# Patient Record
Sex: Male | Born: 1960 | Race: Black or African American | Hispanic: No | Marital: Single | Smoking: Never smoker
Health system: Southern US, Community
[De-identification: ages and names within clinical notes are randomized; demographics above are authoritative.]

## PROBLEM LIST (undated history)

## (undated) ENCOUNTER — Emergency Department (HOSPITAL_COMMUNITY)

## (undated) DIAGNOSIS — I10 Essential (primary) hypertension: Secondary | ICD-10-CM

## (undated) DIAGNOSIS — J189 Pneumonia, unspecified organism: Secondary | ICD-10-CM

## (undated) DIAGNOSIS — D649 Anemia, unspecified: Secondary | ICD-10-CM

## (undated) DIAGNOSIS — E0500 Thyrotoxicosis with diffuse goiter without thyrotoxic crisis or storm: Secondary | ICD-10-CM

## (undated) DIAGNOSIS — J45909 Unspecified asthma, uncomplicated: Secondary | ICD-10-CM

## (undated) DIAGNOSIS — J9601 Acute respiratory failure with hypoxia: Secondary | ICD-10-CM

## (undated) DIAGNOSIS — E119 Type 2 diabetes mellitus without complications: Secondary | ICD-10-CM

---

## 1998-06-08 ENCOUNTER — Ambulatory Visit (HOSPITAL_COMMUNITY): Admission: RE | Admit: 1998-06-08 | Discharge: 1998-06-08 | Payer: Self-pay | Admitting: Internal Medicine

## 1998-06-09 ENCOUNTER — Encounter: Payer: Self-pay | Admitting: Internal Medicine

## 1998-06-26 ENCOUNTER — Ambulatory Visit (HOSPITAL_COMMUNITY): Admission: RE | Admit: 1998-06-26 | Discharge: 1998-06-26 | Payer: Self-pay | Admitting: Internal Medicine

## 1998-06-26 ENCOUNTER — Encounter: Payer: Self-pay | Admitting: Internal Medicine

## 2011-04-02 ENCOUNTER — Emergency Department (HOSPITAL_COMMUNITY)
Admission: EM | Admit: 2011-04-02 | Discharge: 2011-04-02 | Disposition: A | Discharge status: Home / Self Care | Payer: Worker's Compensation | Attending: Emergency Medicine | Admitting: Emergency Medicine

## 2011-04-02 ENCOUNTER — Encounter: Payer: Self-pay | Admitting: Emergency Medicine

## 2011-04-02 DIAGNOSIS — W268XXA Contact with other sharp object(s), not elsewhere classified, initial encounter: Secondary | ICD-10-CM | POA: Insufficient documentation

## 2011-04-02 DIAGNOSIS — Y9269 Other specified industrial and construction area as the place of occurrence of the external cause: Secondary | ICD-10-CM | POA: Insufficient documentation

## 2011-04-02 DIAGNOSIS — S61419A Laceration without foreign body of unspecified hand, initial encounter: Secondary | ICD-10-CM

## 2011-04-02 DIAGNOSIS — S61209A Unspecified open wound of unspecified finger without damage to nail, initial encounter: Secondary | ICD-10-CM | POA: Insufficient documentation

## 2011-04-02 MED ORDER — LIDOCAINE HCL (PF) 1 % IJ SOLN
5.0000 mL | Freq: Once | INTRAMUSCULAR | Status: AC
  Administered 2011-04-02: 5 mL via INTRADERMAL
  Filled 2011-04-02: qty 5

## 2011-04-02 MED ORDER — BACITRACIN ZINC 500 UNIT/GM EX OINT
TOPICAL_OINTMENT | Freq: Once | CUTANEOUS | Status: AC
  Administered 2011-04-02: 06:00:00 via TOPICAL

## 2011-04-02 MED ORDER — TETANUS-DIPHTH-ACELL PERTUSSIS 5-2.5-18.5 LF-MCG/0.5 IM SUSP
0.5000 mL | Freq: Once | INTRAMUSCULAR | Status: AC
  Administered 2011-04-02: 0.5 mL via INTRAMUSCULAR
  Filled 2011-04-02: qty 0.5

## 2011-04-02 MED ORDER — BACITRACIN ZINC 500 UNIT/GM EX OINT
TOPICAL_OINTMENT | CUTANEOUS | Status: AC
  Filled 2011-04-02: qty 0.9

## 2011-04-02 NOTE — ED Notes (Signed)
Pt lacerated base of left thumb with a razor knife at work, active bleeding controlled with pressure

## 2011-04-02 NOTE — ED Notes (Signed)
Set up for laceration repair

## 2011-04-02 NOTE — ED Provider Notes (Addendum)
History     CSN: [id number] Arrival date & time: 04/02/2011  3:09 AM   Chief Complaint  Patient presents with  . Extremity Laceration     (Include location/radiation/quality/duration/timing/severity/associated sxs/prior treatment) HPI Comments: Seen 20  Patient is a 50 y.o. male presenting with skin laceration. The history is provided by the patient.  Laceration  The incident occurred 1 to 2 hours ago (cut hand with a box cutter at work.). Pain location: left hand. The laceration is 2 cm in size. The laceration mechanism was a a razor. The pain is at a severity of 3/10. The pain is mild. He reports no foreign bodies present. His tetanus status is unknown.     History reviewed. No pertinent past medical history.   History reviewed. No pertinent past surgical history.  No family history on file.  History  Substance Use Topics  . Smoking status: Never Smoker   . Smokeless tobacco: Not on file  . Alcohol Use: No      Review of Systems  All other systems reviewed and are negative.    Allergies  Review of patient's allergies indicates no known allergies.  Home Medications  No current outpatient prescriptions on file.  Physical Exam    BP 140/84  Pulse 96  Temp(Src) 98.4 F (36.9 C) (Oral)  Resp 16  Ht 6\' 2"  (1.88 m)  Wt 365 lb (165.563 kg)  BMI 46.86 kg/m2  SpO2 97%  Physical Exam  Nursing note and vitals reviewed. Constitutional: He is oriented to person, place, and time. He appears well-developed and well-nourished.  HENT:  Head: Normocephalic and atraumatic.  Eyes: EOM are normal.  Neck: Normal range of motion.  Cardiovascular: Normal rate, normal heart sounds and intact distal pulses.   Pulmonary/Chest: Effort normal and breath sounds normal.  Musculoskeletal: Normal range of motion.  Neurological: He is alert and oriented to person, place, and time.  Skin: Skin is warm and dry.       2 cm superficial laceration to base of left thumb, bleeding  controlled.    ED Course  LACERATION REPAIR Date/Time: 04/02/2011 4:30 AM Performed by: Annamarie Dawley. Authorized by: Annamarie Dawley Consent: Verbal consent obtained. Risks and benefits: risks, benefits and alternatives were discussed Consent given by: patient Patient understanding: patient states understanding of the procedure being performed Patient consent: the patient's understanding of the procedure matches consent given Patient identity confirmed: verbally with patient and arm band Time out: Immediately prior to procedure a "time out" was called to verify the correct patient, procedure, equipment, support staff and site/side marked as required. Location: left thumb. Laceration length: 2 cm Tendon involvement: none Nerve involvement: none Vascular damage: no Local anesthetic: lidocaine 1% without epinephrine Patient sedated: no Irrigation solution: saline Irrigation method: syringe Amount of cleaning: standard Debridement: none Degree of undermining: none Skin closure: 5-0 Prolene Number of sutures: 2 Technique: simple Approximation: close Approximation difficulty: simple Dressing: 4x4 sterile gauze and antibiotic ointment Patient tolerance: Patient tolerated the procedure well with no immediate complications.   Patient with superficial laceration to thumb. Sutures, tetanus, workman's compensation form completed. Patient understands and agrees with initial ED impression and plan with expectations set for ED visit. MDM Reviewed: nursing note and vitals          Nicoletta Dress. Colon Branch, MD 04/02/11 [id number]  Nicoletta Dress. Colon Branch, MD 04/16/11 [phone]

## 2011-04-02 NOTE — ED Notes (Signed)
Pt left the er stating no needs 

## 2012-07-13 ENCOUNTER — Inpatient Hospital Stay (HOSPITAL_COMMUNITY)
Admission: EM | Admit: 2012-07-13 | Discharge: 2012-07-20 | DRG: 541 | Disposition: A | Discharge status: Home / Self Care | Payer: BC Managed Care – PPO | Attending: Internal Medicine | Admitting: Internal Medicine

## 2012-07-13 ENCOUNTER — Other Ambulatory Visit: Payer: Self-pay

## 2012-07-13 ENCOUNTER — Encounter (HOSPITAL_COMMUNITY): Payer: Self-pay | Admitting: *Deleted

## 2012-07-13 ENCOUNTER — Emergency Department (HOSPITAL_COMMUNITY): Payer: BC Managed Care – PPO

## 2012-07-13 DIAGNOSIS — J9600 Acute respiratory failure, unspecified whether with hypoxia or hypercapnia: Secondary | ICD-10-CM | POA: Diagnosis present

## 2012-07-13 DIAGNOSIS — E860 Dehydration: Secondary | ICD-10-CM | POA: Diagnosis present

## 2012-07-13 DIAGNOSIS — D72819 Decreased white blood cell count, unspecified: Secondary | ICD-10-CM | POA: Diagnosis present

## 2012-07-13 DIAGNOSIS — D649 Anemia, unspecified: Secondary | ICD-10-CM | POA: Diagnosis present

## 2012-07-13 DIAGNOSIS — E871 Hypo-osmolality and hyponatremia: Secondary | ICD-10-CM | POA: Diagnosis present

## 2012-07-13 DIAGNOSIS — E861 Hypovolemia: Secondary | ICD-10-CM | POA: Diagnosis present

## 2012-07-13 DIAGNOSIS — Z6841 Body Mass Index (BMI) 40.0 and over, adult: Secondary | ICD-10-CM

## 2012-07-13 DIAGNOSIS — R0902 Hypoxemia: Secondary | ICD-10-CM | POA: Diagnosis present

## 2012-07-13 DIAGNOSIS — J9601 Acute respiratory failure with hypoxia: Secondary | ICD-10-CM

## 2012-07-13 DIAGNOSIS — J189 Pneumonia, unspecified organism: Principal | ICD-10-CM | POA: Diagnosis present

## 2012-07-13 DIAGNOSIS — D6959 Other secondary thrombocytopenia: Secondary | ICD-10-CM | POA: Diagnosis present

## 2012-07-13 DIAGNOSIS — E876 Hypokalemia: Secondary | ICD-10-CM | POA: Diagnosis present

## 2012-07-13 DIAGNOSIS — D696 Thrombocytopenia, unspecified: Secondary | ICD-10-CM

## 2012-07-13 HISTORY — DX: Acute respiratory failure with hypoxia: J96.01

## 2012-07-13 HISTORY — DX: Pneumonia, unspecified organism: J18.9

## 2012-07-13 LAB — CBC WITH DIFFERENTIAL/PLATELET
Basophils Relative: 1 % (ref 0–1)
Eosinophils Relative: 0 % (ref 0–5)
HCT: 36.2 % — ABNORMAL LOW (ref 39.0–52.0)
Hemoglobin: 12.8 g/dL — ABNORMAL LOW (ref 13.0–17.0)
Lymphocytes Relative: 35 % (ref 12–46)
MCV: 88.7 fL (ref 78.0–100.0)
Monocytes Relative: 5 % (ref 3–12)
Neutro Abs: 2 10*3/uL (ref 1.7–7.7)
WBC: 3.4 10*3/uL — ABNORMAL LOW (ref 4.0–10.5)

## 2012-07-13 LAB — BLOOD GAS, ARTERIAL
Bicarbonate: 29 mEq/L — ABNORMAL HIGH (ref 20.0–24.0)
O2 Content: 4.5 L/min
O2 Saturation: 88.7 %
pCO2 arterial: 46.6 mmHg — ABNORMAL HIGH (ref 35.0–45.0)
pH, Arterial: 7.41 (ref 7.350–7.450)
pO2, Arterial: 57.8 mmHg — ABNORMAL LOW (ref 80.0–100.0)

## 2012-07-13 LAB — MRSA PCR SCREENING: MRSA by PCR: NEGATIVE

## 2012-07-13 LAB — BASIC METABOLIC PANEL
CO2: 34 mEq/L — ABNORMAL HIGH (ref 19–32)
Chloride: 91 mEq/L — ABNORMAL LOW (ref 96–112)
GFR calc Af Amer: >90 mL/min (ref >90)
Potassium: 3.3 mEq/L — ABNORMAL LOW (ref 3.5–5.1)

## 2012-07-13 LAB — PRO B NATRIURETIC PEPTIDE: Pro B Natriuretic peptide (BNP): 13 pg/mL (ref 0–125)

## 2012-07-13 LAB — INFLUENZA PANEL BY PCR (TYPE A & B): H1N1 flu by pcr: NOT DETECTED

## 2012-07-13 MED ORDER — SODIUM CHLORIDE 0.9 % IV BOLUS (SEPSIS)
1000.0000 mL | Freq: Once | INTRAVENOUS | Status: AC
  Administered 2012-07-13: 1000 mL via INTRAVENOUS

## 2012-07-13 MED ORDER — ONDANSETRON HCL 4 MG/2ML IJ SOLN
4.0000 mg | Freq: Once | INTRAMUSCULAR | Status: AC
  Administered 2012-07-13: 4 mg via INTRAVENOUS
  Filled 2012-07-13: qty 2

## 2012-07-13 MED ORDER — OSELTAMIVIR PHOSPHATE 75 MG PO CAPS
75.0000 mg | ORAL_CAPSULE | Freq: Two times a day (BID) | ORAL | Status: DC
  Administered 2012-07-13: 75 mg via ORAL
  Filled 2012-07-13: qty 1

## 2012-07-13 MED ORDER — ALBUTEROL SULFATE (5 MG/ML) 0.5% IN NEBU
5.0000 mg | INHALATION_SOLUTION | Freq: Once | RESPIRATORY_TRACT | Status: AC
  Administered 2012-07-13: 5 mg via RESPIRATORY_TRACT
  Filled 2012-07-13 (×2): qty 1

## 2012-07-13 MED ORDER — SODIUM CHLORIDE 0.9 % IV SOLN
INTRAVENOUS | Status: DC
  Administered 2012-07-13 – 2012-07-14 (×2): via INTRAVENOUS
  Administered 2012-07-16: 20 mL/h via INTRAVENOUS

## 2012-07-13 MED ORDER — ALBUTEROL SULFATE (5 MG/ML) 0.5% IN NEBU
2.5000 mg | INHALATION_SOLUTION | RESPIRATORY_TRACT | Status: DC
  Administered 2012-07-13 – 2012-07-18 (×27): 2.5 mg via RESPIRATORY_TRACT
  Filled 2012-07-13 (×26): qty 0.5

## 2012-07-13 MED ORDER — POTASSIUM CHLORIDE CRYS ER 20 MEQ PO TBCR
40.0000 meq | EXTENDED_RELEASE_TABLET | Freq: Once | ORAL | Status: AC
  Administered 2012-07-13: 40 meq via ORAL
  Filled 2012-07-13: qty 2

## 2012-07-13 MED ORDER — IPRATROPIUM BROMIDE 0.02 % IN SOLN
0.5000 mg | Freq: Once | RESPIRATORY_TRACT | Status: AC
  Administered 2012-07-13: 0.5 mg via RESPIRATORY_TRACT
  Filled 2012-07-13 (×2): qty 2.5

## 2012-07-13 MED ORDER — DEXTROSE 5 % IV SOLN
500.0000 mg | INTRAVENOUS | Status: DC
  Administered 2012-07-14 – 2012-07-19 (×6): 500 mg via INTRAVENOUS
  Filled 2012-07-13 (×7): qty 500

## 2012-07-13 MED ORDER — ALBUTEROL SULFATE (5 MG/ML) 0.5% IN NEBU: 2.5000 mg | INHALATION_SOLUTION | RESPIRATORY_TRACT | Status: DC | PRN

## 2012-07-13 MED ORDER — DEXTROSE 5 % IV SOLN
500.0000 mg | Freq: Once | INTRAVENOUS | Status: AC
  Administered 2012-07-13: 500 mg via INTRAVENOUS
  Filled 2012-07-13: qty 500

## 2012-07-13 MED ORDER — DEXTROSE 5 % IV SOLN
1.0000 g | INTRAVENOUS | Status: DC
  Administered 2012-07-14 – 2012-07-19 (×6): 1 g via INTRAVENOUS
  Filled 2012-07-13 (×7): qty 10

## 2012-07-13 MED ORDER — DEXTROSE 5 % IV SOLN
1.0000 g | Freq: Once | INTRAVENOUS | Status: AC
  Administered 2012-07-13: 1 g via INTRAVENOUS
  Filled 2012-07-13: qty 10

## 2012-07-13 MED ORDER — GUAIFENESIN ER 600 MG PO TB12
1200.0000 mg | ORAL_TABLET | Freq: Two times a day (BID) | ORAL | Status: DC
  Administered 2012-07-13 – 2012-07-20 (×14): 1200 mg via ORAL
  Filled 2012-07-13 (×14): qty 2

## 2012-07-13 NOTE — ED Provider Notes (Signed)
History     CSN: [id number]  Arrival date & time 07/13/12  1250   First MD Initiated Contact with Patient 07/13/12 1513      Chief Complaint  Patient presents with  . Influenza    (Consider location/radiation/quality/duration/timing/severity/associated sxs/prior treatment) HPI Comments: Fred Mcintosh presents with a 6 days history of cough,  Subjective fever including intermittent chills,  Persistent nausea, but only one episode of emesis (occurring yesterday) but persistent diarrhea,  Stating he has watery diarrhea every time he tries to eat or drink anything.  He reports 2 episodes of non bloody diarrhea today.  He has had increasing weakness,  Increased cough with green sputum production and increasing shortness of breath.  Brother at the bedside states he walked into the bathroom 2 days ago and passed out, patient added that he felt really lightheaded prior to the episode.  He denies chest pain.  He is taking alka seltzer cold and flu remedy which has not improved his symptoms.  The history is provided by the patient.    History reviewed. No pertinent past medical history.  History reviewed. No pertinent past surgical history.  No family history on file.  History  Substance Use Topics  . Smoking status: Never Smoker   . Smokeless tobacco: Not on file  . Alcohol Use: No      Review of Systems  Constitutional: Positive for fever, chills, appetite change and fatigue.  HENT: Negative for congestion, sore throat, trouble swallowing and neck pain.   Eyes: Negative.   Respiratory: Positive for cough, shortness of breath and wheezing. Negative for chest tightness.   Cardiovascular: Negative for chest pain.  Gastrointestinal: Positive for nausea, vomiting and diarrhea. Negative for abdominal pain.  Genitourinary: Negative.   Musculoskeletal: Negative for joint swelling and arthralgias.  Skin: Negative.  Negative for rash and wound.  Neurological: Positive for weakness  and light-headedness. Negative for dizziness, numbness and headaches.  Hematological: Negative.   Psychiatric/Behavioral: Negative.     Allergies  Review of patient's allergies indicates no known allergies.  Home Medications   Current Outpatient Rx  Name  Route  Sig  Dispense  Refill  . PHENYLEPH-CPM-DM-APAP 11-16-08-250 MG PO TBEF   Oral   Take 1 tablet by mouth daily as needed. For cough           BP 111/75  Pulse 70  Temp 98.4 F (36.9 C) (Oral)  Resp 20  Ht 6\' 2"  (1.88 m)  Wt 370 lb (167.831 kg)  BMI 47.51 kg/m2  SpO2 100%  Physical Exam  Nursing note and vitals reviewed. Constitutional: He appears well-developed and well-nourished.  HENT:  Head: Normocephalic and atraumatic.  Mouth/Throat: Uvula is midline. Mucous membranes are dry. No oropharyngeal exudate, posterior oropharyngeal edema or posterior oropharyngeal erythema.  Eyes: Conjunctivae normal are normal.  Neck: Normal range of motion.  Cardiovascular: Normal rate, regular rhythm, normal heart sounds and intact distal pulses.   Pulmonary/Chest: Effort normal. No respiratory distress. He has decreased breath sounds. He has wheezes.       Decreased breath sounds throughout all lung fields,  Wheeze,  Prolonged expiration throughout.  Abdominal: Soft. Bowel sounds are normal. There is no tenderness. There is no rigidity and no guarding.  Musculoskeletal: Normal range of motion. He exhibits no edema.  Neurological: He is alert.  Skin: Skin is warm and dry.  Psychiatric: He has a normal mood and affect.    ED Course  Procedures (including critical care time)  Labs Reviewed  CBC WITH DIFFERENTIAL - Abnormal; Notable for the following:    WBC 3.4 (*)     RBC 4.08 (*)     Hemoglobin 12.8 (*)     HCT 36.2 (*)     All other components within normal limits  BASIC METABOLIC PANEL   Dg Chest 2 View  07/13/2012  *RADIOLOGY REPORT*  Clinical Data: Cough.  Shortness of breath.  CHEST - 2 VIEW  Comparison: None   Findings:  Heart size is normal.  Mediastinal shadows are normal. There is extensive airspace filling bilaterally consistent with bronchopneumonia.  Acute cardiogenic edema could present with a similar appearance.  No effusions.  No significant bony finding.  IMPRESSION: Bilateral airspace filling most consistent with bilateral bronchopneumonia.  Acute cardiogenic edema can present with this pattern.   Original Report Authenticated By: Paulina Fusi, M.D.      No diagnosis found.  5:06 PM respiratory therapist in room to give neb tx.  Recheck o2 74% on room air.  Alb/atrovent given.  Order for oxygen at 2 L.  Rocephin/zithromax IV ordered.  Will plan for admission once labs resulted.  Dr. Adriana Simas aware of patient and will follow and call for admission once labs back.  MDM  Bilateral pneumonia with hypoxia.        Burgess Amor, [place] 07/13/12 1715

## 2012-07-13 NOTE — ED Notes (Signed)
MD at bedside.-Memon at bedside 

## 2012-07-13 NOTE — ED Notes (Signed)
Breathing tx in progress.

## 2012-07-13 NOTE — ED Provider Notes (Addendum)
History     CSN: [id number]  Arrival date & time 07/13/12  1250   First MD Initiated Contact with Patient 07/13/12 1513      Chief Complaint  Patient presents with  . Influenza    (Consider location/radiation/quality/duration/timing/severity/associated sxs/prior treatment) HPI.... Cough, weakness, chills, fever or several days. Level V caveat for urgent need for intervention.  Nothing makes symptoms better or worse. Severity is moderate to severe.  History reviewed. No pertinent past medical history.  History reviewed. No pertinent past surgical history.  No family history on file.  History  Substance Use Topics  . Smoking status: Never Smoker   . Smokeless tobacco: Not on file  . Alcohol Use: No      Review of Systems  Unable to perform ROS: Other    Allergies  Review of patient's allergies indicates no known allergies.  Home Medications   Current Outpatient Rx  Name  Route  Sig  Dispense  Refill  . PHENYLEPH-CPM-DM-APAP 11-16-08-250 MG PO TBEF   Oral   Take 1 tablet by mouth daily as needed. For cough           BP 111/75  Pulse 70  Temp 98.4 F (36.9 C) (Oral)  Resp 20  Ht 6\' 2"  (1.88 m)  Wt 370 lb (167.831 kg)  BMI 47.51 kg/m2  SpO2 74%  Physical Exam  Nursing note and vitals reviewed. Constitutional: He is oriented to person, place, and time.       Morbidly obese  HENT:  Head: Normocephalic and atraumatic.  Eyes: Conjunctivae normal and EOM are normal. Pupils are equal, round, and reactive to light.  Neck: Normal range of motion. Neck supple.  Cardiovascular: Normal rate, regular rhythm and normal heart sounds.   Pulmonary/Chest: Effort normal.       Scattered rhonchi  Abdominal: Soft. Bowel sounds are normal.  Musculoskeletal: Normal range of motion.  Neurological: He is alert and oriented to person, place, and time.  Skin: Skin is warm and dry.  Psychiatric: He has a normal mood and affect.    ED Course  Procedures (including  critical care time)  Labs Reviewed  BASIC METABOLIC PANEL - Abnormal; Notable for the following:    Sodium 133 (*)     Potassium 3.3 (*)     Chloride 91 (*)     CO2 34 (*)     Glucose, Bld 110 (*)     GFR calc non Af Amer 78 (*)     All other components within normal limits  CBC WITH DIFFERENTIAL - Abnormal; Notable for the following:    WBC 3.4 (*)     RBC 4.08 (*)     Hemoglobin 12.8 (*)     HCT 36.2 (*)     Platelets 95 (*)  LARGE PLATELETS PRESENT   All other components within normal limits   Dg Chest 2 View  07/13/2012  *RADIOLOGY REPORT*  Clinical Data: Cough.  Shortness of breath.  CHEST - 2 VIEW  Comparison: None  Findings:  Heart size is normal.  Mediastinal shadows are normal. There is extensive airspace filling bilaterally consistent with bronchopneumonia.  Acute cardiogenic edema could present with a similar appearance.  No effusions.  No significant bony finding.  IMPRESSION: Bilateral airspace filling most consistent with bilateral bronchopneumonia.  Acute cardiogenic edema can present with this pattern.   Original Report Authenticated By: Paulina Fusi, M.D.    Date: 07/13/2012  Rate: 89  Rhythm: normal sinus rhythm  QRS Axis:  normal  Intervals: normal  ST/T Wave abnormalities: normal  Conduction Disutrbances: none  Narrative Interpretation: unremarkable      1. Community acquired pneumonia       MDM  Medical screening examination/treatment/procedure(s) were conducted as a shared visit with non-physician practitioner(s) and myself.  I personally evaluated the patient during the encounter.  Chest x-ray shows bilateral pneumonia. Patient desats when not on oxygen. Admit to hospitalist         Donnetta Hutching, MD 07/13/12 1734  Donnetta Hutching, MD 07/20/12 [phone]

## 2012-07-13 NOTE — ED Notes (Signed)
Cough , body aches, sore throat, onset 12/21.  Says he "passed out" at home. Today.  Alert, talking,  NO NVD

## 2012-07-13 NOTE — ED Notes (Addendum)
Cough,  Generalized weakness, chills, fever. Symptoms began on the 21st, per pt. Pt states no current N/V/D. Last had vomiting 3 days ago.

## 2012-07-13 NOTE — Plan of Care (Signed)
Problem: Consults Goal: Pneumonia Patient Education See Patient Educatio Module for education specifics.  Outcome: Progressing Pt came to ED with c/o SOB with weakness &cough & congestion for about 5days. Upon arrival to ICU he was 4 liters [place] with sats=87-88%. Pt given resp TX & then placed on 50% venturi mask. Pt given handouts r/t pneumonia.  Problem: Phase I Progression Outcomes Goal: Dyspnea controlled at rest Outcome: Progressing Pt remains on 50% venturi with sats 95% without any dyspnea Goal: First antibiotic given within 6hrs of admit Outcome: Progressing Antibiotics given in ED with in 6 hours Goal: Confirm chest x-ray completed Outcome: Progressing Chest xray completed @ 1640 confirming bilateral pneumonia

## 2012-07-13 NOTE — H&P (Signed)
[facility] History and Physical  Fred Mcintosh XBJ:[id number] DOB: 08-18-1960 DOA: 07/13/2012  Referring physician: Dr. Adriana Simas PCP: Geraldo Pitter, MD    Chief Complaint: shortness of breath  HPI: Fred Mcintosh is a 51 y.o. male with no known past medical history, presents to the emergency room with complaints of shortness of breath. Patient reports having generalized weakness, sore throat, nasal congestion, productive cough and shortness of breath for the past week. His symptoms have progressively gotten worse. He initially thought that he had the flu and was self-medicating with over-the-counter medications. When his symptoms continued to get worse. He presents to the ER for evaluation. He also feels feverish. Denies any chest pain. He has dyspnea on exertion. Productive cough. He did have an episode of lightheadedness which resulted in him passing out for 2-3 seconds. He denies any head trauma. He reports having approximately 2 loose stools daily but do not contain any signs of bleeding. He did have one episode of vomiting yesterday. On arrival to the emergency room he was found to have a chest x-ray with bilateral pneumonia. He's been referred for admission.  Review of Systems: Pertinent positives as per history of present illness, otherwise negative  History reviewed. No pertinent past medical history. History reviewed. No pertinent past surgical history. Social History:  reports that he has never smoked. He does not have any smokeless tobacco history on file. He reports that he does not drink alcohol or use illicit drugs.   No Known Allergies  Family history: Mother has heart disease, father died from complications of alcoholism  Prior to Admission medications   Medication Sig Start Date End Date Taking? Authorizing Provider  Phenyleph-CPM-DM-APAP (ALKA-SELTZER PLUS COLD & FLU) 11-16-08-250 MG TBEF Take 1 tablet by mouth daily as needed. For cough   Yes Historical  Provider, MD   Physical Exam: Filed Vitals:   07/13/12 1322 07/13/12 1707 07/13/12 1751  BP: 111/75    Pulse: 70    Temp: 98.4 F (36.9 C)    TempSrc: Oral    Resp: 20    Height: 6\' 2"  (1.88 m)    Weight: 167.831 kg (370 lb)    SpO2: 100% 74% 91%     General:  Sitting up in bed, no signs of distress  Eyes: Pupils are equal round react to light  ENT: Mucous membranes are dry  Neck: Supple  Cardiovascular: S1, S2, regular rate and rhythm  Respiratory: Bilateral expiratory wheezes with crackles at bases  Abdomen: Soft, nontender, nondistended, bowel sounds are active  Skin: Deferred  Musculoskeletal: Deferred  Psychiatric: Normal affect, cooperative with exam  Neurologic: Grossly intact, nonfocal  Labs on Admission:  Basic Metabolic Panel:  Lab 07/13/12 3086  NA 133*  K 3.3*  CL 91*  CO2 34*  GLUCOSE 110*  BUN 17  CREATININE 1.08  CALCIUM 8.5  MG --  PHOS --   Liver Function Tests: No results found for this basename: AST:5,ALT:5,ALKPHOS:5,BILITOT:5,PROT:5,ALBUMIN:5 in the last 168 hours No results found for this basename: LIPASE:5,AMYLASE:5 in the last 168 hours No results found for this basename: AMMONIA:5 in the last 168 hours CBC:  Lab 07/13/12 1625  WBC 3.4*  NEUTROABS 2.0  HGB 12.8*  HCT 36.2*  MCV 88.7  PLT 95*   Cardiac Enzymes: No results found for this basename: CKTOTAL:5,CKMB:5,CKMBINDEX:5,TROPONINI:5 in the last 168 hours  BNP (last 3 results) No results found for this basename: PROBNP:3 in the last 8760 hours CBG: No results found for this basename: GLUCAP:5  in the last 168 hours  Radiological Exams on Admission: Dg Chest 2 View  07/13/2012  *RADIOLOGY REPORT*  Clinical Data: Cough.  Shortness of breath.  CHEST - 2 VIEW  Comparison: None  Findings:  Heart size is normal.  Mediastinal shadows are normal. There is extensive airspace filling bilaterally consistent with bronchopneumonia.  Acute cardiogenic edema could present with a  similar appearance.  No effusions.  No significant bony finding.  IMPRESSION: Bilateral airspace filling most consistent with bilateral bronchopneumonia.  Acute cardiogenic edema can present with this pattern.   Original Report Authenticated By: Paulina Fusi, M.D.     EKG: Independently reviewed. No acute ST-T changes, normal EKG  Assessment/Plan Principal Problem:  *Community acquired pneumonia Active Problems:  Acute respiratory failure  Hypokalemia  Dehydration  Thrombocytopenia   1. Acute respiratory failure with hypoxia secondary to community acquired pneumonia. Patient is on 4-5 L of oxygen via nasal cannula at this time. He does not have any signs of distress. We will admit him to the step down unit for further treatment. He will receive antibiotics for community-acquired pneumonia per pneumonia orders set which include ceftriaxone and azithromycin. We will also send for blood cultures. We will send for influenza PCR. He will be started empirically on Tamiflu. Check BNP. 2. Dehydration. Patient will receive IV fluid rehydration. 3. Hypokalemia. Replace 4. Thrombocytopenia/Leukopenia. Likely secondary to underlying infection. We'll continue to follow. 5. Hyponatremia, likely due to hypovolemia, will continue with IV fluids.   Code Status: full code Family Communication: discussed with patient, no family at bedside Disposition Plan: discharge home once improved  Time spent:  Mandee Pluta [facility] Pager [phone]  If 7PM-7AM, please contact night-coverage [email or web address] Password [facility] 07/13/2012, 5:58 PM

## 2012-07-13 NOTE — ED Notes (Signed)
Order noted for BC's after Rocephin started

## 2012-07-14 ENCOUNTER — Inpatient Hospital Stay (HOSPITAL_COMMUNITY): Payer: BC Managed Care – PPO

## 2012-07-14 LAB — COMPREHENSIVE METABOLIC PANEL
AST: 101 U/L — ABNORMAL HIGH (ref 0–37)
Albumin: 3 g/dL — ABNORMAL LOW (ref 3.5–5.2)
Alkaline Phosphatase: 50 U/L (ref 39–117)
BUN: 12 mg/dL (ref 6–23)
Chloride: 95 mEq/L — ABNORMAL LOW (ref 96–112)
Potassium: 3.3 mEq/L — ABNORMAL LOW (ref 3.5–5.1)
Total Protein: 6.5 g/dL (ref 6.0–8.3)

## 2012-07-14 LAB — CBC
HCT: 33.2 % — ABNORMAL LOW (ref 39.0–52.0)
MCH: 31.2 pg (ref 26.0–34.0)
MCV: 90 fL (ref 78.0–100.0)
RBC: 3.69 MIL/uL — ABNORMAL LOW (ref 4.22–5.81)
RDW: 12.3 % (ref 11.5–15.5)
WBC: 3.5 10*3/uL — ABNORMAL LOW (ref 4.0–10.5)

## 2012-07-14 LAB — HIV ANTIBODY (ROUTINE TESTING W REFLEX): HIV: NONREACTIVE

## 2012-07-14 MED ORDER — POTASSIUM CHLORIDE CRYS ER 20 MEQ PO TBCR
40.0000 meq | EXTENDED_RELEASE_TABLET | Freq: Once | ORAL | Status: AC
  Administered 2012-07-14: 40 meq via ORAL
  Filled 2012-07-14: qty 2

## 2012-07-14 MED ORDER — ACETAMINOPHEN 325 MG PO TABS: 650.0000 mg | ORAL_TABLET | Freq: Four times a day (QID) | ORAL | Status: DC | PRN

## 2012-07-14 NOTE — Progress Notes (Signed)
PT WEANED FROM VENTIMASK AT 50% TO [place] AT 6L/MIN. TODAY. WHENUP TO BSC FOR BM A FEW MINUTES AGO O2SAT WOULD NOT COME UP. PT BACK ON O2 VIA VENTI MASK AT 50%. O2 SAT BACK UP TO THE LOW 90'S.

## 2012-07-14 NOTE — Progress Notes (Signed)
ANTIBIOTIC CONSULT NOTE - INITIAL  Pharmacy Consult for Renal Adjustment Antibiotics Indication: pneumonia  No Known Allergies  Patient Measurements: Height: 6\' 2"  (188 cm) Weight: 343 lb 0.6 oz (155.6 kg) IBW/kg (Calculated) : 82.2    Vital Signs: BP: 113/75 mmHg (12/28 0800) Pulse Rate: 98  (12/28 0800) Intake/Output from previous day: 12/27 0701 - 12/28 0700 In: 1421.7 [I.V.:1121.7; IV Piggyback:300] Out: 600 [Urine:600] Intake/Output from this shift: Total I/O In: 460 [P.O.:360; I.V.:100] Out: -   Labs:  Basename 07/14/12 0458 07/13/12 1625  WBC 3.5* 3.4*  HGB 11.5* 12.8*  PLT 102* 95*  LABCREA -- --  CREATININE 0.97 1.08   Estimated Creatinine Clearance: 142.2 ml/min (by C-G formula based on Cr of 0.97). No results found for this basename: VANCOTROUGH:2,VANCOPEAK:2,VANCORANDOM:2,GENTTROUGH:2,GENTPEAK:2,GENTRANDOM:2,TOBRATROUGH:2,TOBRAPEAK:2,TOBRARND:2,AMIKACINPEAK:2,AMIKACINTROU:2,AMIKACIN:2, in the last 72 hours   Microbiology: Recent Results (from the past 720 hour(s))  CULTURE, BLOOD (ROUTINE X 2)     Status: Normal (Preliminary result)   Collection Time   07/13/12  6:08 PM      Component Value Range Status Comment   Specimen Description LEFT ANTECUBITAL   Final    Special Requests BOTTLES DRAWN AEROBIC AND ANAEROBIC 6 CC EACH   Final    Culture NO GROWTH 1 DAY   Final    Report Status PENDING   Incomplete   CULTURE, BLOOD (ROUTINE X 2)     Status: Normal (Preliminary result)   Collection Time   07/13/12  6:09 PM      Component Value Range Status Comment   Specimen Description BLOOD LEFT HAND   Final    Special Requests BOTTLES DRAWN AEROBIC AND ANAEROBIC 6 CC EACH   Final    Culture NO GROWTH 1 DAY   Final    Report Status PENDING   Incomplete   MRSA PCR SCREENING     Status: Normal   Collection Time   07/13/12  7:16 PM      Component Value Range Status Comment   MRSA by PCR NEGATIVE  NEGATIVE Final     Medical History: History reviewed. No  pertinent past medical history.  Medications:  Scheduled:    . albuterol  2.5 mg Nebulization Q4H  . [COMPLETED] albuterol  5 mg Nebulization Once  . [COMPLETED] azithromycin  500 mg Intravenous Once  . azithromycin  500 mg Intravenous Q24H  . [COMPLETED] cefTRIAXone (ROCEPHIN)  IV  1 g Intravenous Once  . cefTRIAXone (ROCEPHIN)  IV  1 g Intravenous Q24H  . guaiFENesin  1,200 mg Oral BID  . [COMPLETED] ipratropium  0.5 mg Nebulization Once  . [COMPLETED] ondansetron  4 mg Intravenous Once  . [COMPLETED] potassium chloride  40 mEq Oral Once  . potassium chloride  40 mEq Oral Once  . [COMPLETED] sodium chloride  1,000 mL Intravenous Once  . [DISCONTINUED] oseltamivir  75 mg Oral BID   Assessment: Excellent Renal function Rocephin and Azithromycin started  Goal of Therapy:  Eradicate infection  Plan:  Renal adjustment not necessary with Rocephin and Azithromycin Continue Rocephin 1 GM IV every 24 hours Continue Azithromycin 500 mg IV every 24 hours   Alizay Bronkema Bennett 07/14/2012,9:25 AM

## 2012-07-14 NOTE — Progress Notes (Signed)
[facility]             Progress Note   Subjective: Patient feels a little better today.  He has a productive cough.  Does not feel as short of breath.  Objective: Vital signs in last 24 hours: Temp:  [98.1 F (36.7 C)-98.4 F (36.9 C)] 98.1 F (36.7 C) (12/27 2000) Pulse Rate:  [70-98] 98  (12/28 0800) Resp:  [17-26] 22  (12/28 0800) BP: (108-122)/(65-77) 113/75 mmHg (12/28 0800) SpO2:  [74 %-100 %] 89 % (12/28 0820) FiO2 (%):  [50 %] 50 % (12/28 0820) Weight:  [155.6 kg (343 lb 0.6 oz)-167.831 kg (370 lb)] 155.6 kg (343 lb 0.6 oz) (12/27 2000) Weight change:  Last BM Date: 07/13/12  Intake/Output from previous day: 12/27 0701 - 12/28 0700 In: 1421.7 [I.V.:1121.7; IV Piggyback:300] Out: 600 [Urine:600] Total I/O In: 460 [P.O.:360; I.V.:100] Out: -    Physical Exam: General: awake, drowsy, appears ill HEENT: No bruits, no goiter. Heart: Regular rate and rhythm, without murmurs, rubs, gallops. Lungs: b/l exp wheezing with crackles at bases Abdomen: Soft, nontender, nondistended, positive bowel sounds. Extremities: No clubbing cyanosis or edema with positive pedal pulses. Neuro: Grossly intact, nonfocal.    Lab Results: Basic Metabolic Panel:  Basename 07/14/12 0458 07/13/12 1625  NA 135 133*  K 3.3* 3.3*  CL 95* 91*  CO2 32 34*  GLUCOSE 105* 110*  BUN 12 17  CREATININE 0.97 1.08  CALCIUM 8.0* 8.5  MG -- --  PHOS -- --   Liver Function Tests:  [facility] 07/14/12 0458  AST 101*  ALT 26  ALKPHOS 50  BILITOT 0.8  PROT 6.5  ALBUMIN 3.0*   No results found for this basename: LIPASE:2,AMYLASE:2 in the last 72 hours No results found for this basename: AMMONIA:2 in the last 72 hours CBC:  Basename 07/14/12 0458 07/13/12 1625  WBC 3.5* 3.4*  NEUTROABS -- 2.0  HGB 11.5* 12.8*  HCT 33.2* 36.2*  MCV 90.0 88.7  PLT 102* 95*   Cardiac Enzymes: No results found for this basename: CKTOTAL:3,CKMB:3,CKMBINDEX:3,TROPONINI:3 in the last 72  hours BNP:  Basename 07/13/12 1809  PROBNP 13.0   D-Dimer: No results found for this basename: DDIMER:2 in the last 72 hours CBG: No results found for this basename: GLUCAP:6 in the last 72 hours Hemoglobin A1C: No results found for this basename: HGBA1C in the last 72 hours Fasting Lipid Panel: No results found for this basename: CHOL,HDL,LDLCALC,TRIG,CHOLHDL,LDLDIRECT in the last 72 hours Thyroid Function Tests: No results found for this basename: TSH,T4TOTAL,FREET4,T3FREE,THYROIDAB in the last 72 hours Anemia Panel: No results found for this basename: VITAMINB12,FOLATE,FERRITIN,TIBC,IRON,RETICCTPCT in the last 72 hours Coagulation: No results found for this basename: LABPROT:2,INR:2 in the last 72 hours Urine Drug Screen: Drugs of Abuse  No results found for this basename: labopia, cocainscrnur, labbenz, amphetmu, thcu, labbarb    Alcohol Level: No results found for this basename: ETH:2 in the last 72 hours Urinalysis: No results found for this basename: COLORURINE:2,APPERANCEUR:2,LABSPEC:2,PHURINE:2,GLUCOSEU:2,HGBUR:2,BILIRUBINUR:2,KETONESUR:2,PROTEINUR:2,UROBILINOGEN:2,NITRITE:2,LEUKOCYTESUR:2 in the last 72 hours  Recent Results (from the past 240 hour(s))  CULTURE, BLOOD (ROUTINE X 2)     Status: Normal (Preliminary result)   Collection Time   07/13/12  6:08 PM      Component Value Range Status Comment   Specimen Description LEFT ANTECUBITAL   Final    Special Requests BOTTLES DRAWN AEROBIC AND ANAEROBIC 6 CC EACH   Final    Culture NO GROWTH 1 DAY   Final    Report Status  PENDING   Incomplete   CULTURE, BLOOD (ROUTINE X 2)     Status: Normal (Preliminary result)   Collection Time   07/13/12  6:09 PM      Component Value Range Status Comment   Specimen Description BLOOD LEFT HAND   Final    Special Requests BOTTLES DRAWN AEROBIC AND ANAEROBIC 6 CC EACH   Final    Culture NO GROWTH 1 DAY   Final    Report Status PENDING   Incomplete   MRSA PCR SCREENING     Status:  Normal   Collection Time   07/13/12  7:16 PM      Component Value Range Status Comment   MRSA by PCR NEGATIVE  NEGATIVE Final     Studies/Results: Dg Chest 2 View  07/13/2012  *RADIOLOGY REPORT*  Clinical Data: Cough.  Shortness of breath.  CHEST - 2 VIEW  Comparison: None  Findings:  Heart size is normal.  Mediastinal shadows are normal. There is extensive airspace filling bilaterally consistent with bronchopneumonia.  Acute cardiogenic edema could present with a similar appearance.  No effusions.  No significant bony finding.  IMPRESSION: Bilateral airspace filling most consistent with bilateral bronchopneumonia.  Acute cardiogenic edema can present with this pattern.   Original Report Authenticated By: Paulina Fusi, M.D.     Medications: Scheduled Meds:   . albuterol  2.5 mg Nebulization Q4H  . azithromycin  500 mg Intravenous Q24H  . cefTRIAXone (ROCEPHIN)  IV  1 g Intravenous Q24H  . guaiFENesin  1,200 mg Oral BID  . oseltamivir  75 mg Oral BID   Continuous Infusions:   . sodium chloride 100 mL/hr at 07/14/12 0800   PRN Meds:.albuterol  Assessment/Plan:  Principal Problem:  *Community acquired pneumonia Active Problems:  Acute respiratory failure  Hypokalemia  Dehydration  Thrombocytopenia   1. Acute respiratory failure with hypoxia secondary to community acquired pneumonia. Patient was placed on Ventimask overnight. Arterial blood gas confirms significant hypoxia. BNP was found to be normal. He is on broad-spectrum antibiotics with ceftriaxone and azithromycin. Influenza PCR was negative and therefore Tamiflu will be stopped. We'll continue current treatments and monitor respiratory status closely. If patient further decompensates, then he may require BiPAP/mechanical ventilation. We'll continue with nebulizer treatments. Repeat chest x-ray today. 2. Dehydration. Patient will receive IV fluid rehydration. 3. Hypokalemia. Replace 4. Thrombocytopenia/Leukopenia. Likely  secondary to underlying infection. HIV negative. We'll continue to follow. 5. Hyponatremia, due to hypovolemia, will continue with IV fluids.   Time spent coordinating care: Critical care: 25 minutes   LOS: 1 day   MEMON,JEHANZEB [facility] Pager: [phone] 07/14/2012, 9:07 AM

## 2012-07-15 DIAGNOSIS — D696 Thrombocytopenia, unspecified: Secondary | ICD-10-CM

## 2012-07-15 LAB — CBC
HCT: 30.9 % — ABNORMAL LOW (ref 39.0–52.0)
Hemoglobin: 10.7 g/dL — ABNORMAL LOW (ref 13.0–17.0)
RBC: 3.43 MIL/uL — ABNORMAL LOW (ref 4.22–5.81)

## 2012-07-15 LAB — BASIC METABOLIC PANEL
CO2: 31 mEq/L (ref 19–32)
Glucose, Bld: 124 mg/dL — ABNORMAL HIGH (ref 70–99)
Potassium: 3.5 mEq/L (ref 3.5–5.1)
Sodium: 137 mEq/L (ref 135–145)

## 2012-07-15 MED ORDER — IPRATROPIUM BROMIDE 0.02 % IN SOLN
0.5000 mg | RESPIRATORY_TRACT | Status: DC
  Administered 2012-07-15 – 2012-07-18 (×18): 0.5 mg via RESPIRATORY_TRACT
  Filled 2012-07-15 (×16): qty 2.5

## 2012-07-15 MED ORDER — ENOXAPARIN SODIUM 40 MG/0.4ML ~~LOC~~ SOLN
40.0000 mg | SUBCUTANEOUS | Status: DC
  Administered 2012-07-15 – 2012-07-16 (×2): 40 mg via SUBCUTANEOUS
  Filled 2012-07-15 (×2): qty 0.4

## 2012-07-15 NOTE — Progress Notes (Signed)
[facility]             Progress Note   Subjective: No complaints offered today. He is still short of breath, but feels this may be improving.  Objective: Vital signs in last 24 hours: Temp:  [97.9 F (36.6 C)-99.2 F (37.3 C)] 97.9 F (36.6 C) (12/29 0800) Pulse Rate:  [69-101] 82  (12/29 0800) Resp:  [21-30] 22  (12/29 0800) BP: (100-142)/(61-101) 112/61 mmHg (12/29 0800) SpO2:  [87 %-95 %] 89 % (12/29 0800) FiO2 (%):  [50 %] 50 % (12/29 0721) Weight:  [160.4 kg (353 lb 9.9 oz)] 160.4 kg (353 lb 9.9 oz) (12/29 0405) Weight change: -7.431 kg (-16 lb 6.1 oz) Last BM Date: 07/14/12  Intake/Output from previous day: 12/28 0701 - 12/29 0700 In: 3980 [P.O.:1380; I.V.:2300; IV Piggyback:300] Out: 1075 [Urine:1075] Total I/O In: 200 [I.V.:200] Out: 325 [Urine:325]   Physical Exam: General: awake, drowsy, appears ill HEENT: No bruits, no goiter. Heart: Regular rate and rhythm, without murmurs, rubs, gallops. Lungs: Occasional bilateral rhonchi, wheezing appears to be improving. Abdomen: Soft, nontender, nondistended, positive bowel sounds. Extremities: No clubbing cyanosis or edema with positive pedal pulses. Neuro: Grossly intact, nonfocal.    Lab Results: Basic Metabolic Panel:  Basename 07/15/12 0444 07/14/12 0458  NA 137 135  K 3.5 3.3*  CL 99 95*  CO2 31 32  GLUCOSE 124* 105*  BUN 6 12  CREATININE 0.78 0.97  CALCIUM 8.0* 8.0*  MG -- --  PHOS -- --   Liver Function Tests:  [facility] 07/14/12 0458  AST 101*  ALT 26  ALKPHOS 50  BILITOT 0.8  PROT 6.5  ALBUMIN 3.0*   No results found for this basename: LIPASE:2,AMYLASE:2 in the last 72 hours No results found for this basename: AMMONIA:2 in the last 72 hours CBC:  Basename 07/15/12 0444 07/14/12 0458 07/13/12 1625  WBC 3.8* 3.5* --  NEUTROABS -- -- 2.0  HGB 10.7* 11.5* --  HCT 30.9* 33.2* --  MCV 90.1 90.0 --  PLT 118* 102* --   Cardiac Enzymes: No results found for this basename:  CKTOTAL:3,CKMB:3,CKMBINDEX:3,TROPONINI:3 in the last 72 hours BNP:  Basename 07/13/12 1809  PROBNP 13.0   D-Dimer: No results found for this basename: DDIMER:2 in the last 72 hours CBG: No results found for this basename: GLUCAP:6 in the last 72 hours Hemoglobin A1C: No results found for this basename: HGBA1C in the last 72 hours Fasting Lipid Panel: No results found for this basename: CHOL,HDL,LDLCALC,TRIG,CHOLHDL,LDLDIRECT in the last 72 hours Thyroid Function Tests: No results found for this basename: TSH,T4TOTAL,FREET4,T3FREE,THYROIDAB in the last 72 hours Anemia Panel: No results found for this basename: VITAMINB12,FOLATE,FERRITIN,TIBC,IRON,RETICCTPCT in the last 72 hours Coagulation: No results found for this basename: LABPROT:2,INR:2 in the last 72 hours Urine Drug Screen: Drugs of Abuse  No results found for this basename: labopia,  cocainscrnur,  labbenz,  amphetmu,  thcu,  labbarb    Alcohol Level: No results found for this basename: ETH:2 in the last 72 hours Urinalysis: No results found for this basename: COLORURINE:2,APPERANCEUR:2,LABSPEC:2,PHURINE:2,GLUCOSEU:2,HGBUR:2,BILIRUBINUR:2,KETONESUR:2,PROTEINUR:2,UROBILINOGEN:2,NITRITE:2,LEUKOCYTESUR:2 in the last 72 hours  Recent Results (from the past 240 hour(s))  CULTURE, BLOOD (ROUTINE X 2)     Status: Normal (Preliminary result)   Collection Time   07/13/12  6:08 PM      Component Value Range Status Comment   Specimen Description LEFT ANTECUBITAL   Final    Special Requests BOTTLES DRAWN AEROBIC AND ANAEROBIC 6 CC EACH   Final    Culture  NO GROWTH 2 DAYS   Final    Report Status PENDING   Incomplete   CULTURE, BLOOD (ROUTINE X 2)     Status: Normal (Preliminary result)   Collection Time   07/13/12  6:09 PM      Component Value Range Status Comment   Specimen Description BLOOD LEFT HAND   Final    Special Requests BOTTLES DRAWN AEROBIC AND ANAEROBIC 6 CC EACH   Final    Culture NO GROWTH 2 DAYS   Final     Report Status PENDING   Incomplete   MRSA PCR SCREENING     Status: Normal   Collection Time   07/13/12  7:16 PM      Component Value Range Status Comment   MRSA by PCR NEGATIVE  NEGATIVE Final     Studies/Results: Dg Chest 2 View  07/13/2012  *RADIOLOGY REPORT*  Clinical Data: Cough.  Shortness of breath.  CHEST - 2 VIEW  Comparison: None  Findings:  Heart size is normal.  Mediastinal shadows are normal. There is extensive airspace filling bilaterally consistent with bronchopneumonia.  Acute cardiogenic edema could present with a similar appearance.  No effusions.  No significant bony finding.  IMPRESSION: Bilateral airspace filling most consistent with bilateral bronchopneumonia.  Acute cardiogenic edema can present with this pattern.   Original Report Authenticated By: Paulina Fusi, M.D.    Dg Chest Port 1 View  07/14/2012  *RADIOLOGY REPORT*  Clinical Data: Pneumonia.  PORTABLE CHEST - 1 VIEW  Comparison: 07/13/2012  Findings: Cardiomegaly.  Bilateral airspace opacities are noted, right greater than left, slightly worsened since prior study.  This could represent asymmetric edema or infection.  No visible effusions.  No acute bony abnormality.  IMPRESSION: Cardiomegaly.  Bilateral airspace disease has increased, asymmetric edema versus infection.   Original Report Authenticated By: Charlett Nose, M.D.     Medications: Scheduled Meds:    . albuterol  2.5 mg Nebulization Q4H  . azithromycin  500 mg Intravenous Q24H  . cefTRIAXone (ROCEPHIN)  IV  1 g Intravenous Q24H  . guaiFENesin  1,200 mg Oral BID   Continuous Infusions:    . sodium chloride 100 mL/hr at 07/15/12 0800   PRN Meds:.acetaminophen, albuterol  Assessment/Plan:  Principal Problem:  *Community acquired pneumonia Active Problems:  Acute respiratory failure  Hypokalemia  Dehydration  Thrombocytopenia   1. Acute respiratory failure with hypoxia secondary to community acquired pneumonia. Repeat chest x-ray  yesterday shows worsening pneumonia. He is on antibiotics including Rocephin and azithromycin. Influenza screen was found to be negative. He is still requiring oxygen via Ventimask. We will add incentive spirometer, and Atrovent nebulizer. Continue the remainder of treatments and follow closely. 2. Dehydration. Patient will receive IV fluid rehydration. 3. Hypokalemia. Replace 4. Thrombocytopenia/Leukopenia. Likely secondary to underlying infection. HIV negative. Starting to improve. We'll continue to follow. 5. Hyponatremia, due to hypovolemia, improved with IV fluids 6. Due to prophylaxis. Since thrombocytopenia is improving, we will start Lovenox.   Time spent coordinating care: 25 minutes   LOS: 2 days   MEMON,JEHANZEB [facility] Pager: [phone] 07/15/2012, 9:46 AM

## 2012-07-16 ENCOUNTER — Inpatient Hospital Stay (HOSPITAL_COMMUNITY): Payer: BC Managed Care – PPO

## 2012-07-16 LAB — BLOOD GAS, ARTERIAL
Acid-Base Excess: 5.1 mmol/L — ABNORMAL HIGH (ref 0.0–2.0)
O2 Saturation: 89.1 %
TCO2: 24.5 mmol/L (ref 0–100)
pCO2 arterial: 43.8 mmHg (ref 35.0–45.0)
pO2, Arterial: 57.4 mmHg — ABNORMAL LOW (ref 80.0–100.0)

## 2012-07-16 LAB — CBC
HCT: 30.6 % — ABNORMAL LOW (ref 39.0–52.0)
Hemoglobin: 10.4 g/dL — ABNORMAL LOW (ref 13.0–17.0)
MCH: 30.9 pg (ref 26.0–34.0)
MCV: 90.8 fL (ref 78.0–100.0)
RBC: 3.37 MIL/uL — ABNORMAL LOW (ref 4.22–5.81)

## 2012-07-16 LAB — BASIC METABOLIC PANEL
BUN: 5 mg/dL — ABNORMAL LOW (ref 6–23)
CO2: 32 mEq/L (ref 19–32)
Calcium: 8.4 mg/dL (ref 8.4–10.5)
Creatinine, Ser: 0.78 mg/dL (ref 0.50–1.35)
Glucose, Bld: 128 mg/dL — ABNORMAL HIGH (ref 70–99)

## 2012-07-16 LAB — PRO B NATRIURETIC PEPTIDE: Pro B Natriuretic peptide (BNP): 79.7 pg/mL (ref 0–125)

## 2012-07-16 NOTE — Progress Notes (Signed)
Pt refuse to do incentive spirometer and I told him I will be writing this not and he said it was find that he was not going to do it.

## 2012-07-16 NOTE — Progress Notes (Signed)
[facility]             Progress Note   Subjective: Still feels short of breath.  Has a productive cough.  Frustrated due to slow progression.  Objective: Vital signs in last 24 hours: Temp:  [98.3 F (36.8 C)-98.9 F (37.2 C)] 98.4 F (36.9 C) (12/30 0443) Pulse Rate:  [74-97] 84  (12/30 0800) Resp:  [23-31] 25  (12/30 0800) BP: (94-143)/(52-77) 118/74 mmHg (12/30 0800) SpO2:  [84 %-98 %] 98 % (12/30 0800) FiO2 (%):  [50 %-60 %] 60 % (12/30 0759) Weight:  [160.8 kg (354 lb 8 oz)] 160.8 kg (354 lb 8 oz) (12/30 0443) Weight change: 0.4 kg (14.1 oz) Last BM Date: 07/14/12  Intake/Output from previous day: 12/29 0701 - 12/30 0700 In: 1020 [I.V.:720; IV Piggyback:300] Out: 1800 [Urine:1800] Total I/O In: 260 [P.O.:240; I.V.:20] Out: 600 [Urine:600]   Physical Exam: General: awake, drowsy, appears ill HEENT: No bruits, no goiter. Heart: Regular rate and rhythm, without murmurs, rubs, gallops. Lungs: Occasional bilateral rhonchi, wheezing appears to be improving. Abdomen: Soft, nontender, nondistended, positive bowel sounds. Extremities: No clubbing cyanosis or edema with positive pedal pulses. Neuro: Grossly intact, nonfocal.    Lab Results: Basic Metabolic Panel:  Basename 07/16/12 0434 07/15/12 0444  NA 139 137  K 3.6 3.5  CL 101 99  CO2 32 31  GLUCOSE 128* 124*  BUN 5* 6  CREATININE 0.78 0.78  CALCIUM 8.4 8.0*  MG -- --  PHOS -- --   Liver Function Tests:  [facility] 07/14/12 0458  AST 101*  ALT 26  ALKPHOS 50  BILITOT 0.8  PROT 6.5  ALBUMIN 3.0*   No results found for this basename: LIPASE:2,AMYLASE:2 in the last 72 hours No results found for this basename: AMMONIA:2 in the last 72 hours CBC:  Basename 07/16/12 0434 07/15/12 0444 07/13/12 1625  WBC 5.4 3.8* --  NEUTROABS -- -- 2.0  HGB 10.4* 10.7* --  HCT 30.6* 30.9* --  MCV 90.8 90.1 --  PLT 148* 118* --   Cardiac Enzymes: No results found for this basename:  CKTOTAL:3,CKMB:3,CKMBINDEX:3,TROPONINI:3 in the last 72 hours BNP:  Basename 07/13/12 1809  PROBNP 13.0   D-Dimer: No results found for this basename: DDIMER:2 in the last 72 hours CBG: No results found for this basename: GLUCAP:6 in the last 72 hours Hemoglobin A1C: No results found for this basename: HGBA1C in the last 72 hours Fasting Lipid Panel: No results found for this basename: CHOL,HDL,LDLCALC,TRIG,CHOLHDL,LDLDIRECT in the last 72 hours Thyroid Function Tests: No results found for this basename: TSH,T4TOTAL,FREET4,T3FREE,THYROIDAB in the last 72 hours Anemia Panel: No results found for this basename: VITAMINB12,FOLATE,FERRITIN,TIBC,IRON,RETICCTPCT in the last 72 hours Coagulation: No results found for this basename: LABPROT:2,INR:2 in the last 72 hours Urine Drug Screen: Drugs of Abuse  No results found for this basename: labopia,  cocainscrnur,  labbenz,  amphetmu,  thcu,  labbarb    Alcohol Level: No results found for this basename: ETH:2 in the last 72 hours Urinalysis: No results found for this basename: COLORURINE:2,APPERANCEUR:2,LABSPEC:2,PHURINE:2,GLUCOSEU:2,HGBUR:2,BILIRUBINUR:2,KETONESUR:2,PROTEINUR:2,UROBILINOGEN:2,NITRITE:2,LEUKOCYTESUR:2 in the last 72 hours  Recent Results (from the past 240 hour(s))  CULTURE, BLOOD (ROUTINE X 2)     Status: Normal (Preliminary result)   Collection Time   07/13/12  6:08 PM      Component Value Range Status Comment   Specimen Description LEFT ANTECUBITAL   Final    Special Requests BOTTLES DRAWN AEROBIC AND ANAEROBIC 6 CC EACH   Final    Culture NO  GROWTH 2 DAYS   Final    Report Status PENDING   Incomplete   CULTURE, BLOOD (ROUTINE X 2)     Status: Normal (Preliminary result)   Collection Time   07/13/12  6:09 PM      Component Value Range Status Comment   Specimen Description BLOOD LEFT HAND   Final    Special Requests BOTTLES DRAWN AEROBIC AND ANAEROBIC 6 CC EACH   Final    Culture NO GROWTH 2 DAYS   Final     Report Status PENDING   Incomplete   MRSA PCR SCREENING     Status: Normal   Collection Time   07/13/12  7:16 PM      Component Value Range Status Comment   MRSA by PCR NEGATIVE  NEGATIVE Final     Studies/Results: Dg Chest Port 1 View  07/14/2012  *RADIOLOGY REPORT*  Clinical Data: Pneumonia.  PORTABLE CHEST - 1 VIEW  Comparison: 07/13/2012  Findings: Cardiomegaly.  Bilateral airspace opacities are noted, right greater than left, slightly worsened since prior study.  This could represent asymmetric edema or infection.  No visible effusions.  No acute bony abnormality.  IMPRESSION: Cardiomegaly.  Bilateral airspace disease has increased, asymmetric edema versus infection.   Original Report Authenticated By: Charlett Nose, M.D.     Medications: Scheduled Meds:    . albuterol  2.5 mg Nebulization Q4H  . azithromycin  500 mg Intravenous Q24H  . cefTRIAXone (ROCEPHIN)  IV  1 g Intravenous Q24H  . enoxaparin (LOVENOX) injection  40 mg Subcutaneous Q24H  . guaiFENesin  1,200 mg Oral BID  . ipratropium  0.5 mg Nebulization Q4H   Continuous Infusions:    . sodium chloride 20 mL/hr at 07/16/12 0800   PRN Meds:.acetaminophen, albuterol  Assessment/Plan:  Principal Problem:  *Community acquired pneumonia Active Problems:  Acute respiratory failure  Hypokalemia  Dehydration  Thrombocytopenia   1. Acute respiratory failure with hypoxia secondary to community acquired pneumonia. Repeat chest x-ray yesterday showed worsening pneumonia. He is on antibiotics including Rocephin and azithromycin. Influenza screen was found to be negative. He is still requiring oxygen via Ventimask. He has not had much improvement clinically.  Will request pulmonology input regarding any further available treatments.  ? If patient would benefit from CT chest.  Will defer to pulmonary. 2. Dehydration. Patient will receive IV fluid rehydration. 3. Hypokalemia. Replace 4. Thrombocytopenia/Leukopenia. Likely  secondary to underlying infection. HIV negative. Starting to improve. We'll continue to follow. 5. Hyponatremia, due to hypovolemia, improved with IV fluids 6. DVT prophylaxis.  Lovenox.   Time spent coordinating care: 25 minutes   LOS: 3 days   Simara Rhyner [facility] Pager: [phone] 07/16/2012, 10:33 AM

## 2012-07-16 NOTE — Plan of Care (Signed)
Problem: Phase II Progression Outcomes Goal: Encourage coughing & deep breathing Outcome: Not Progressing Pt refuses to do cough and deep breathing and IS exercises even after reiteration of importance

## 2012-07-16 NOTE — ED Provider Notes (Signed)
Medical screening examination/treatment/procedure(s) were performed by non-physician practitioner and as supervising physician I was immediately available for consultation/collaboration.  Gissel Keilman, MD 07/16/12 2047 

## 2012-07-16 NOTE — Progress Notes (Signed)
PT SOB OF BREATH W/ ANY EXERTION. O2 SAT DROPS W/ ANY ACTIVITY.  PT VERY CONCENRED ABOUT NOT BEING ABLE TO GO BACK TO WORK BY 07/19/12 .

## 2012-07-16 NOTE — Progress Notes (Signed)
UR Chart Review Completed  

## 2012-07-16 NOTE — Progress Notes (Signed)
Pt placed back on 50% VM to sleep in due to decreasing SpO2 and Increased RR.Marland Kitchen

## 2012-07-16 NOTE — Consult Note (Signed)
Consult requested by: Dr. Kerry Hough Consult requested for pneumonia/respiratory failure:  HPI: This is a 51 year old who came to the emergency room because of shortness of breath and not feeling well and was found to have bilateral pneumonia. He was pancytopenic which is improved somewhat and was thought to be related to his acute illness. He says he is still coughing. He is still congested.  History reviewed. No pertinent past medical history.   No family history on file.   History   Social History  . Marital Status: Unknown    Spouse Name: N/A    Number of Children: N/A  . Years of Education: N/A   Social History Main Topics  . Smoking status: Never Smoker   . Smokeless tobacco: None  . Alcohol Use: No  . Drug Use: No  . Sexually Active:    Other Topics Concern  . None   Social History Narrative  . None     ROS: He says he was "born with asthma. He had some sort of respiratory problems approximately age 72. He has been told he has cardiac enlargement. He has not had any hemoptysis.    Objective: Vital signs in last 24 hours: Temp:  [97.8 F (36.6 C)-98.9 F (37.2 C)] 97.8 F (36.6 C) (12/30 1200) Pulse Rate:  [74-97] 86  (12/30 1700) Resp:  [19-33] 25  (12/30 1700) BP: (94-120)/(52-74) 96/64 mmHg (12/30 1400) SpO2:  [86 %-98 %] 92 % (12/30 1700) FiO2 (%):  [50 %-60 %] 50 % (12/30 1503) Weight:  [160.8 kg (354 lb 8 oz)] 160.8 kg (354 lb 8 oz) (12/30 0443) Weight change: 0.4 kg (14.1 oz) Last BM Date: 07/14/12  Intake/Output from previous day: 12/29 0701 - 12/30 0700 In: 1020 [I.V.:720; IV Piggyback:300] Out: 1800 [Urine:1800]  PHYSICAL EXAM He is an obese male in no acute distress. He is using oxygen by mask. His pupils are reactive. His nose and throat clear. His neck is supple. His chest shows decreased breath sounds but no wheezes or rhonchi now. His heart is regular without gallop. His abdomen is soft without masses. Extremities showed no edema. Central  nervous system examination is grossly intact  Lab Results: Basic Metabolic Panel:  Basename 07/16/12 0434 07/15/12 0444  NA 139 137  K 3.6 3.5  CL 101 99  CO2 32 31  GLUCOSE 128* 124*  BUN 5* 6  CREATININE 0.78 0.78  CALCIUM 8.4 8.0*  MG -- --  PHOS -- --   Liver Function Tests:  [facility] 07/14/12 0458  AST 101*  ALT 26  ALKPHOS 50  BILITOT 0.8  PROT 6.5  ALBUMIN 3.0*   No results found for this basename: LIPASE:2,AMYLASE:2 in the last 72 hours No results found for this basename: AMMONIA:2 in the last 72 hours CBC:  Basename 07/16/12 0434 07/15/12 0444  WBC 5.4 3.8*  NEUTROABS -- --  HGB 10.4* 10.7*  HCT 30.6* 30.9*  MCV 90.8 90.1  PLT 148* 118*   Cardiac Enzymes: No results found for this basename: CKTOTAL:3,CKMB:3,CKMBINDEX:3,TROPONINI:3 in the last 72 hours BNP:  Basename 07/16/12 1055  PROBNP 79.7   D-Dimer: No results found for this basename: DDIMER:2 in the last 72 hours CBG: No results found for this basename: GLUCAP:6 in the last 72 hours Hemoglobin A1C: No results found for this basename: HGBA1C in the last 72 hours Fasting Lipid Panel: No results found for this basename: CHOL,HDL,LDLCALC,TRIG,CHOLHDL,LDLDIRECT in the last 72 hours Thyroid Function Tests: No results found for this basename: TSH,T4TOTAL,FREET4,T3FREE,THYROIDAB in  the last 72 hours Anemia Panel: No results found for this basename: VITAMINB12,FOLATE,FERRITIN,TIBC,IRON,RETICCTPCT in the last 72 hours Coagulation: No results found for this basename: LABPROT:2,INR:2 in the last 72 hours Urine Drug Screen: Drugs of Abuse  No results found for this basename: labopia, cocainscrnur, labbenz, amphetmu, thcu, labbarb    Alcohol Level: No results found for this basename: ETH:2 in the last 72 hours Urinalysis: No results found for this basename: COLORURINE:2,APPERANCEUR:2,LABSPEC:2,PHURINE:2,GLUCOSEU:2,HGBUR:2,BILIRUBINUR:2,KETONESUR:2,PROTEINUR:2,UROBILINOGEN:2,NITRITE:2,LEUKOCYTESUR:2  in the last 72 hours Misc. Labs:   ABGS:  Basename 07/16/12 1104  PHART 7.439  PO2ART 57.4*  TCO2 24.5  HCO3 29.2*     MICROBIOLOGY: Recent Results (from the past 240 hour(s))  CULTURE, BLOOD (ROUTINE X 2)     Status: Normal (Preliminary result)   Collection Time   07/13/12  6:08 PM      Component Value Range Status Comment   Specimen Description LEFT ANTECUBITAL   Final    Special Requests BOTTLES DRAWN AEROBIC AND ANAEROBIC 6 CC EACH   Final    Culture NO GROWTH 3 DAYS   Final    Report Status PENDING   Incomplete   CULTURE, BLOOD (ROUTINE X 2)     Status: Normal (Preliminary result)   Collection Time   07/13/12  6:09 PM      Component Value Range Status Comment   Specimen Description BLOOD LEFT HAND   Final    Special Requests BOTTLES DRAWN AEROBIC AND ANAEROBIC 6 CC EACH   Final    Culture NO GROWTH 3 DAYS   Final    Report Status PENDING   Incomplete   MRSA PCR SCREENING     Status: Normal   Collection Time   07/13/12  7:16 PM      Component Value Range Status Comment   MRSA by PCR NEGATIVE  NEGATIVE Final     Studies/Results: Dg Chest Port 1 View  07/16/2012  *RADIOLOGY REPORT*  Clinical Data: Pulmonary infiltrates.  PORTABLE CHEST - 1 VIEW  Comparison: 12/28 and 07/13/2012  Findings: There has been improvement in the bilateral pulmonary infiltrates although significant infiltrate remains bilaterally. Heart size and vascularity are normal considering the AP portable technique.  No appreciable effusions.  No acute osseous abnormality.  IMPRESSION: Slight improvement in the bilateral extensive pulmonary infiltrates.   Original Report Authenticated By: Francene Boyers, M.D.     Medications:  Prior to Admission:  Prescriptions prior to admission  Medication Sig Dispense Refill  . Phenyleph-CPM-DM-APAP (ALKA-SELTZER PLUS COLD & FLU) 11-16-08-250 MG TBEF Take 1 tablet by mouth daily as needed. For cough       Scheduled:   . albuterol  2.5 mg Nebulization Q4H  .  azithromycin  500 mg Intravenous Q24H  . cefTRIAXone (ROCEPHIN)  IV  1 g Intravenous Q24H  . enoxaparin (LOVENOX) injection  40 mg Subcutaneous Q24H  . guaiFENesin  1,200 mg Oral BID  . ipratropium  0.5 mg Nebulization Q4H   Continuous:   . sodium chloride 20 mL/hr at 07/16/12 1702   ZOX:[id number], albuterol  Assesment: He has community-acquired pneumonia and respiratory failure with hypoxia. He does say he feels better. I had considered doing CT to make sure he doesn't have something like empyema which might explain his relatively slow progression but since he says he feels better I think I'll hold off on that. I think since this is bilateral pneumonia the fact that he is still sick after 3 days in the hospital is not too surprising although he is frustrated Principal  Problem:  *Community acquired pneumonia Active Problems:  Acute respiratory failure  Hypokalemia  Dehydration  Thrombocytopenia    Plan: Recheck in the morning. If he still having more problems then I will go ahead with CT chest otherwise I would continue his current medications and treatments.    LOS: 3 days   Gretna Bergin L 07/16/2012, 6:12 PM

## 2012-07-17 ENCOUNTER — Inpatient Hospital Stay (HOSPITAL_COMMUNITY): Payer: BC Managed Care – PPO

## 2012-07-17 LAB — BASIC METABOLIC PANEL
BUN: 6 mg/dL (ref 6–23)
CO2: 29 mEq/L (ref 19–32)
Calcium: 8.8 mg/dL (ref 8.4–10.5)
Chloride: 100 mEq/L (ref 96–112)
Creatinine, Ser: 0.68 mg/dL (ref 0.50–1.35)

## 2012-07-17 LAB — CBC
HCT: 30.5 % — ABNORMAL LOW (ref 39.0–52.0)
MCH: 31.1 pg (ref 26.0–34.0)
MCV: 90.2 fL (ref 78.0–100.0)
RBC: 3.38 MIL/uL — ABNORMAL LOW (ref 4.22–5.81)
WBC: 4.9 10*3/uL (ref 4.0–10.5)

## 2012-07-17 MED ORDER — IOHEXOL 300 MG/ML  SOLN
80.0000 mL | Freq: Once | INTRAMUSCULAR | Status: AC | PRN
  Administered 2012-07-17: 80 mL via INTRAVENOUS

## 2012-07-17 MED ORDER — ENOXAPARIN SODIUM 80 MG/0.8ML ~~LOC~~ SOLN
80.0000 mg | SUBCUTANEOUS | Status: DC
  Administered 2012-07-17 – 2012-07-20 (×4): 80 mg via SUBCUTANEOUS
  Filled 2012-07-17 (×4): qty 0.8

## 2012-07-17 NOTE — Progress Notes (Signed)
Present with patient for a fairly brief initial visit offering emotional and spiritual support. Patient expressed strong comments about faith, hospitalization and his health changes.  Offered listening support.  I asked if he could express where his anger was from and what it was towards.  He connected it with having to be in the hospital and being sick.  Hoped to offer time for him to express some of that anger.  He shared that his mother used to be a Optician, dispensing.  Prayed with him before leaving.  Offered supportive help while he continues to be a patient.

## 2012-07-17 NOTE — Progress Notes (Addendum)
Subjective: He says he thinks his breathing is about normal. However he is only oxygenating to 91% O2 saturation on a 50% O2 mask. He is not using his incentive spirometry. He says he does not want to get up.  Objective: Vital signs in last 24 hours: Temp:  [97.7 F (36.5 C)-98.7 F (37.1 C)] 98.6 F (37 C) (12/31 0506) Pulse Rate:  [71-95] 71  (12/31 0600) Resp:  [19-35] 24  (12/31 0600) BP: (96-138)/(59-86) 122/69 mmHg (12/31 0600) SpO2:  [86 %-95 %] 92 % (12/31 0721) FiO2 (%):  [50 %] 50 % (12/31 0721) Weight change:  Last BM Date: 07/16/12  Intake/Output from previous day: 12/30 0701 - 12/31 0700 In: 1840 [P.O.:1080; I.V.:460; IV Piggyback:300] Out: 2000 [Urine:2000]  PHYSICAL EXAM General appearance: alert, mild distress and morbidly obese Resp: rhonchi bilaterally Cardio: regular rate and rhythm, S1, S2 normal, no murmur, click, rub or gallop GI: soft, non-tender; bowel sounds normal; no masses,  no organomegaly Extremities: extremities normal, atraumatic, no cyanosis or edema  Lab Results:    Basic Metabolic Panel:  Basename 07/17/12 0442 07/16/12 0434  NA 136 139  K 3.9 3.6  CL 100 101  CO2 29 32  GLUCOSE 124* 128*  BUN 6 5*  CREATININE 0.68 0.78  CALCIUM 8.8 8.4  MG -- --  PHOS -- --   Liver Function Tests: No results found for this basename: AST:2,ALT:2,ALKPHOS:2,BILITOT:2,PROT:2,ALBUMIN:2 in the last 72 hours No results found for this basename: LIPASE:2,AMYLASE:2 in the last 72 hours No results found for this basename: AMMONIA:2 in the last 72 hours CBC:  Basename 07/17/12 0442 07/16/12 0434  WBC 4.9 5.4  NEUTROABS -- --  HGB 10.5* 10.4*  HCT 30.5* 30.6*  MCV 90.2 90.8  PLT 173 148*   Cardiac Enzymes: No results found for this basename: CKTOTAL:3,CKMB:3,CKMBINDEX:3,TROPONINI:3 in the last 72 hours BNP:  Basename 07/16/12 1055  PROBNP 79.7   D-Dimer: No results found for this basename: DDIMER:2 in the last 72 hours CBG: No results found  for this basename: GLUCAP:6 in the last 72 hours Hemoglobin A1C: No results found for this basename: HGBA1C in the last 72 hours Fasting Lipid Panel: No results found for this basename: CHOL,HDL,LDLCALC,TRIG,CHOLHDL,LDLDIRECT in the last 72 hours Thyroid Function Tests: No results found for this basename: TSH,T4TOTAL,FREET4,T3FREE,THYROIDAB in the last 72 hours Anemia Panel: No results found for this basename: VITAMINB12,FOLATE,FERRITIN,TIBC,IRON,RETICCTPCT in the last 72 hours Coagulation: No results found for this basename: LABPROT:2,INR:2 in the last 72 hours Urine Drug Screen: Drugs of Abuse  No results found for this basename: labopia, cocainscrnur, labbenz, amphetmu, thcu, labbarb    Alcohol Level: No results found for this basename: ETH:2 in the last 72 hours Urinalysis: No results found for this basename: COLORURINE:2,APPERANCEUR:2,LABSPEC:2,PHURINE:2,GLUCOSEU:2,HGBUR:2,BILIRUBINUR:2,KETONESUR:2,PROTEINUR:2,UROBILINOGEN:2,NITRITE:2,LEUKOCYTESUR:2 in the last 72 hours Misc. Labs:  ABGS  Basename 07/16/12 1104  PHART 7.439  PO2ART 57.4*  TCO2 24.5  HCO3 29.2*   CULTURES Recent Results (from the past 240 hour(s))  CULTURE, BLOOD (ROUTINE X 2)     Status: Normal (Preliminary result)   Collection Time   07/13/12  6:08 PM      Component Value Range Status Comment   Specimen Description LEFT ANTECUBITAL   Final    Special Requests BOTTLES DRAWN AEROBIC AND ANAEROBIC 6 CC EACH   Final    Culture NO GROWTH 3 DAYS   Final    Report Status PENDING   Incomplete   CULTURE, BLOOD (ROUTINE X 2)     Status: Normal (Preliminary result)  Collection Time   07/13/12  6:09 PM      Component Value Range Status Comment   Specimen Description BLOOD LEFT HAND   Final    Special Requests BOTTLES DRAWN AEROBIC AND ANAEROBIC 6 CC EACH   Final    Culture NO GROWTH 3 DAYS   Final    Report Status PENDING   Incomplete   MRSA PCR SCREENING     Status: Normal   Collection Time   07/13/12   7:16 PM      Component Value Range Status Comment   MRSA by PCR NEGATIVE  NEGATIVE Final    Studies/Results: Dg Chest Port 1 View  07/16/2012  *RADIOLOGY REPORT*  Clinical Data: Pulmonary infiltrates.  PORTABLE CHEST - 1 VIEW  Comparison: 12/28 and 07/13/2012  Findings: There has been improvement in the bilateral pulmonary infiltrates although significant infiltrate remains bilaterally. Heart size and vascularity are normal considering the AP portable technique.  No appreciable effusions.  No acute osseous abnormality.  IMPRESSION: Slight improvement in the bilateral extensive pulmonary infiltrates.   Original Report Authenticated By: Francene Boyers, M.D.     Medications:  Scheduled:   . albuterol  2.5 mg Nebulization Q4H  . azithromycin  500 mg Intravenous Q24H  . cefTRIAXone (ROCEPHIN)  IV  1 g Intravenous Q24H  . enoxaparin (LOVENOX) injection  40 mg Subcutaneous Q24H  . guaiFENesin  1,200 mg Oral BID  . ipratropium  0.5 mg Nebulization Q4H   Continuous:   . sodium chloride 20 mL/hr at 07/17/12 0600   ZOX:[id number], albuterol  Assesment: He has community-acquired pneumonia but it is bilateral. His chest x-ray was somewhat improved. I would like to see him start using his incentive spirometer and get up and move around because I think that will help him to improve. He's not really coughing and doing deep breathing. Principal Problem:  *Community acquired pneumonia Active Problems:  Acute respiratory failure  Hypokalemia  Dehydration  Thrombocytopenia    Plan: I don't think he necessarily needs a CT chest now. That may be necessary but I'd first like to see him getting up and moving around using incentive spirometry and see if that will improve his situation. I would continue with his current medications    LOS: 4 days   Brennah Quraishi L 07/17/2012, 8:13 AM   I discussed his situation with Dr. Kerry Hough. Since she's not being cooperative with the use of incentive, flutter  valve for cough and deep breathing or even getting up I think we should check a CT and see if we are dealing with anything else`

## 2012-07-17 NOTE — Progress Notes (Signed)
Pt laying in bed. Encouraged pt to get back in chair. Pt up to chair stating "I guess, whatever." Pt still refusing to do IS. Pt becomes very short of breath on movement, but quickly recovers. Pt currently sitting up in chair. Oxygen sats currently 91% on 50% veni mask. Will continue to monitor and encourage pt.

## 2012-07-17 NOTE — Care Management Note (Signed)
    Page 1 of 1   07/20/2012     2:10:26 PM   CARE MANAGEMENT NOTE 07/20/2012  Patient:  Fred Mcintosh, Fred Mcintosh   Account Number:  [id number]  Date Initiated:  07/17/2012  Documentation initiated by:  Rosemary Holms  Subjective/Objective Assessment:   Pt admitted from home. Very frustrated that he is sick and hospitalized. No HH needs anticipated.     Action/Plan:   Anticipated DC Date:  07/20/2012   Anticipated DC Plan:  HOME/SELF CARE      DC Planning Services  CM consult      Choice offered to / List presented to:             Status of service:  Completed, signed off Medicare Important Message given?   (If response is "NO", the following Medicare IM given date fields will be blank) Date Medicare IM given:   Date Additional Medicare IM given:    Discharge Disposition:    Per UR Regulation:    If discussed at Long Length of Stay Meetings, dates discussed:    Comments:  07/20/12 Rosemary Holms RN BSN CM Pt states that he will call Dr. Parke Simmers and re-establish pt/MD relationship. He knows to set up his f/u hospital appt.  07/17/12 Rosemary Holms RN BSN CM

## 2012-07-17 NOTE — Progress Notes (Signed)
[facility]             Progress Note   Subjective: Reports no change in shortness of breath. He is very frustrated about being in the hospital and not being able to return to work. He has not been using his incentive spirometer  Objective: Vital signs in last 24 hours: Temp:  [97.7 F (36.5 C)-98.7 F (37.1 C)] 98.6 F (37 C) (12/31 0506) Pulse Rate:  [71-95] 71  (12/31 0600) Resp:  [19-35] 24  (12/31 0600) BP: (96-138)/(59-86) 122/69 mmHg (12/31 0600) SpO2:  [86 %-95 %] 92 % (12/31 0721) FiO2 (%):  [50 %] 50 % (12/31 0721) Weight change:  Last BM Date: 07/16/12  Intake/Output from previous day: 12/30 0701 - 12/31 0700 In: 1840 [P.O.:1080; I.V.:460; IV Piggyback:300] Out: 2000 [Urine:2000] Total I/O In: -  Out: 400 [Urine:400]   Physical Exam: General: Awake, does not appear to be in any distress, able to complete sentences HEENT: No bruits, no goiter. Heart: Regular rate and rhythm, without murmurs, rubs, gallops. Lungs: bilateral rhonchi Abdomen: Soft, nontender, nondistended, positive bowel sounds. Extremities: No clubbing cyanosis or edema with positive pedal pulses. Neuro: Grossly intact, nonfocal.    Lab Results: Basic Metabolic Panel:  Basename 07/17/12 0442 07/16/12 0434  NA 136 139  K 3.9 3.6  CL 100 101  CO2 29 32  GLUCOSE 124* 128*  BUN 6 5*  CREATININE 0.68 0.78  CALCIUM 8.8 8.4  MG -- --  PHOS -- --   Liver Function Tests: No results found for this basename: AST:2,ALT:2,ALKPHOS:2,BILITOT:2,PROT:2,ALBUMIN:2 in the last 72 hours No results found for this basename: LIPASE:2,AMYLASE:2 in the last 72 hours No results found for this basename: AMMONIA:2 in the last 72 hours CBC:  Basename 07/17/12 0442 07/16/12 0434  WBC 4.9 5.4  NEUTROABS -- --  HGB 10.5* 10.4*  HCT 30.5* 30.6*  MCV 90.2 90.8  PLT 173 148*   Cardiac Enzymes: No results found for this basename: CKTOTAL:3,CKMB:3,CKMBINDEX:3,TROPONINI:3 in the last 72  hours BNP:  Basename 07/16/12 1055  PROBNP 79.7   D-Dimer: No results found for this basename: DDIMER:2 in the last 72 hours CBG: No results found for this basename: GLUCAP:6 in the last 72 hours Hemoglobin A1C: No results found for this basename: HGBA1C in the last 72 hours Fasting Lipid Panel: No results found for this basename: CHOL,HDL,LDLCALC,TRIG,CHOLHDL,LDLDIRECT in the last 72 hours Thyroid Function Tests: No results found for this basename: TSH,T4TOTAL,FREET4,T3FREE,THYROIDAB in the last 72 hours Anemia Panel: No results found for this basename: VITAMINB12,FOLATE,FERRITIN,TIBC,IRON,RETICCTPCT in the last 72 hours Coagulation: No results found for this basename: LABPROT:2,INR:2 in the last 72 hours Urine Drug Screen: Drugs of Abuse  No results found for this basename: labopia,  cocainscrnur,  labbenz,  amphetmu,  thcu,  labbarb    Alcohol Level: No results found for this basename: ETH:2 in the last 72 hours Urinalysis: No results found for this basename: COLORURINE:2,APPERANCEUR:2,LABSPEC:2,PHURINE:2,GLUCOSEU:2,HGBUR:2,BILIRUBINUR:2,KETONESUR:2,PROTEINUR:2,UROBILINOGEN:2,NITRITE:2,LEUKOCYTESUR:2 in the last 72 hours  Recent Results (from the past 240 hour(s))  CULTURE, BLOOD (ROUTINE X 2)     Status: Normal (Preliminary result)   Collection Time   07/13/12  6:08 PM      Component Value Range Status Comment   Specimen Description LEFT ANTECUBITAL   Final    Special Requests BOTTLES DRAWN AEROBIC AND ANAEROBIC 6 CC EACH   Final    Culture NO GROWTH 3 DAYS   Final    Report Status PENDING   Incomplete   CULTURE, BLOOD (ROUTINE X  2)     Status: Normal (Preliminary result)   Collection Time   07/13/12  6:09 PM      Component Value Range Status Comment   Specimen Description BLOOD LEFT HAND   Final    Special Requests BOTTLES DRAWN AEROBIC AND ANAEROBIC 6 CC EACH   Final    Culture NO GROWTH 3 DAYS   Final    Report Status PENDING   Incomplete   MRSA PCR SCREENING      Status: Normal   Collection Time   07/13/12  7:16 PM      Component Value Range Status Comment   MRSA by PCR NEGATIVE  NEGATIVE Final     Studies/Results: Dg Chest Port 1 View  07/16/2012  *RADIOLOGY REPORT*  Clinical Data: Pulmonary infiltrates.  PORTABLE CHEST - 1 VIEW  Comparison: 12/28 and 07/13/2012  Findings: There has been improvement in the bilateral pulmonary infiltrates although significant infiltrate remains bilaterally. Heart size and vascularity are normal considering the AP portable technique.  No appreciable effusions.  No acute osseous abnormality.  IMPRESSION: Slight improvement in the bilateral extensive pulmonary infiltrates.   Original Report Authenticated By: Francene Boyers, M.D.     Medications: Scheduled Meds:    . albuterol  2.5 mg Nebulization Q4H  . azithromycin  500 mg Intravenous Q24H  . cefTRIAXone (ROCEPHIN)  IV  1 g Intravenous Q24H  . enoxaparin (LOVENOX) injection  40 mg Subcutaneous Q24H  . guaiFENesin  1,200 mg Oral BID  . ipratropium  0.5 mg Nebulization Q4H   Continuous Infusions:    . sodium chloride 20 mL/hr at 07/17/12 0600   PRN Meds:.acetaminophen, albuterol  Assessment/Plan:  Principal Problem:  *Community acquired pneumonia Active Problems:  Acute respiratory failure  Hypokalemia  Dehydration  Thrombocytopenia  This is a 51 year old gentleman with no known past medical history who was admitted to the hospital with community-acquired pneumonia and acute respiratory failure.  1. Acute respiratory failure with hypoxia secondary to community acquired pneumonia. Patient has been maintained on IV antibiotic coverage with ceftriaxone and azithromycin. Repeat chest x-ray yesterday did show some mild improvements. Clinically the patient is still requiring oxygen via Ventimask. He has been very slow to progress. His refusal to participate in pulmonary hygiene and moving around it is likely slowing his progression. I reiterated the importance  of deep breathing, coughing and moving around in the patient's recovery. Appreciate Dr. Juanetta Gosling assistance. We will pursue CT of the chest to rule out any other underlying pathology. Continue current antibiotics for now. Due to his current oxygen requirements, we will need to keep him in the step down unit for now. 2. Dehydration. Improved with IV fluids. 3. Hypokalemia. Replaced 4. Thrombocytopenia/Leukopenia. Likely secondary to underlying infection. HIV negative. Now resolved. 5. Hyponatremia, due to hypovolemia, improved with IV fluids 6. DVT prophylaxis.  Lovenox.   Time spent coordinating care: 25 minutes   LOS: 4 days   MEMON,JEHANZEB [facility] Pager: [phone] 07/17/2012, 8:31 AM

## 2012-07-18 ENCOUNTER — Encounter (HOSPITAL_COMMUNITY): Payer: Self-pay | Admitting: Internal Medicine

## 2012-07-18 DIAGNOSIS — D649 Anemia, unspecified: Secondary | ICD-10-CM | POA: Diagnosis present

## 2012-07-18 DIAGNOSIS — J9601 Acute respiratory failure with hypoxia: Secondary | ICD-10-CM | POA: Diagnosis present

## 2012-07-18 LAB — BASIC METABOLIC PANEL
BUN: 9 mg/dL (ref 6–23)
Calcium: 8.8 mg/dL (ref 8.4–10.5)
Creatinine, Ser: 0.66 mg/dL (ref 0.50–1.35)
GFR calc Af Amer: >90 mL/min (ref >90)
GFR calc non Af Amer: >90 mL/min (ref >90)

## 2012-07-18 LAB — CBC
HCT: 30.8 % — ABNORMAL LOW (ref 39.0–52.0)
MCH: 30.7 pg (ref 26.0–34.0)
MCHC: 34.1 g/dL (ref 30.0–36.0)
MCV: 90.1 fL (ref 78.0–100.0)
RDW: 12.6 % (ref 11.5–15.5)

## 2012-07-18 LAB — CULTURE, BLOOD (ROUTINE X 2): Culture: NO GROWTH

## 2012-07-18 MED ORDER — VANCOMYCIN HCL 10 G IV SOLR
2500.0000 mg | Freq: Once | INTRAVENOUS | Status: AC
  Administered 2012-07-18: 2500 mg via INTRAVENOUS
  Filled 2012-07-18: qty 2500

## 2012-07-18 MED ORDER — SENNOSIDES-DOCUSATE SODIUM 8.6-50 MG PO TABS
1.0000 | ORAL_TABLET | Freq: Every day | ORAL | Status: DC
  Administered 2012-07-18 – 2012-07-20 (×3): 1 via ORAL
  Filled 2012-07-18 (×3): qty 1

## 2012-07-18 MED ORDER — VANCOMYCIN HCL 10 G IV SOLR
1500.0000 mg | Freq: Two times a day (BID) | INTRAVENOUS | Status: DC
  Administered 2012-07-18 – 2012-07-20 (×4): 1500 mg via INTRAVENOUS
  Filled 2012-07-18 (×11): qty 1500

## 2012-07-18 MED ORDER — ALBUTEROL SULFATE (5 MG/ML) 0.5% IN NEBU
2.5000 mg | INHALATION_SOLUTION | Freq: Three times a day (TID) | RESPIRATORY_TRACT | Status: DC
  Administered 2012-07-18 – 2012-07-20 (×6): 2.5 mg via RESPIRATORY_TRACT
  Filled 2012-07-18 (×6): qty 0.5

## 2012-07-18 MED ORDER — IPRATROPIUM BROMIDE 0.02 % IN SOLN
0.5000 mg | Freq: Three times a day (TID) | RESPIRATORY_TRACT | Status: DC
  Administered 2012-07-18 – 2012-07-20 (×6): 0.5 mg via RESPIRATORY_TRACT
  Filled 2012-07-18 (×6): qty 2.5

## 2012-07-18 NOTE — Progress Notes (Signed)
ANTIBIOTIC CONSULT NOTE - INITIAL  Pharmacy Consult for Vancomycin Indication: pneumonia  No Known Allergies  Patient Measurements: Height: 6\' 2"  (188 cm) Weight: 356 lb 4.2 oz (161.6 kg) IBW/kg (Calculated) : 82.2   Vital Signs: Temp: 98.6 F (37 C) (01/01 0800) Temp src: Oral (01/01 0800) BP: 119/73 mmHg (01/01 0800) Pulse Rate: 78  (01/01 0800) Intake/Output from previous day: 12/31 0701 - 01/01 0700 In: 1120 [P.O.:740; I.V.:80; IV Piggyback:300] Out: 2175 [Urine:2175] Intake/Output from this shift:    Labs:  Basename 07/18/12 0441 07/17/12 0442 07/16/12 0434  WBC 4.5 4.9 5.4  HGB 10.5* 10.5* 10.4*  PLT 195 173 148*  LABCREA -- -- --  CREATININE 0.66 0.68 0.78   Estimated Creatinine Clearance: 176.1 ml/min (by C-G formula based on Cr of 0.66). No results found for this basename: VANCOTROUGH:2,VANCOPEAK:2,VANCORANDOM:2,GENTTROUGH:2,GENTPEAK:2,GENTRANDOM:2,TOBRATROUGH:2,TOBRAPEAK:2,TOBRARND:2,AMIKACINPEAK:2,AMIKACINTROU:2,AMIKACIN:2, in the last 72 hours   Microbiology: Recent Results (from the past 720 hour(s))  CULTURE, BLOOD (ROUTINE X 2)     Status: Normal (Preliminary result)   Collection Time   07/13/12  6:08 PM      Component Value Range Status Comment   Specimen Description BLOOD LEFT ANTECUBITAL   Final    Special Requests BOTTLES DRAWN AEROBIC AND ANAEROBIC 6 CC EACH   Final    Culture NO GROWTH 4 DAYS   Final    Report Status PENDING   Incomplete   CULTURE, BLOOD (ROUTINE X 2)     Status: Normal (Preliminary result)   Collection Time   07/13/12  6:09 PM      Component Value Range Status Comment   Specimen Description BLOOD LEFT HAND   Final    Special Requests BOTTLES DRAWN AEROBIC AND ANAEROBIC 6 CC EACH   Final    Culture NO GROWTH 4 DAYS   Final    Report Status PENDING   Incomplete   MRSA PCR SCREENING     Status: Normal   Collection Time   07/13/12  7:16 PM      Component Value Range Status Comment   MRSA by PCR NEGATIVE  NEGATIVE Final      Medical History: Past Medical History  Diagnosis Date  . Acute respiratory failure with hypoxia 07/13/2012    Secondary to multi-lobar bilateral pneumonia  . Community acquired pneumonia 07/13/2012    Bilateral/multi-lobar    Medications:  Scheduled:    . albuterol  2.5 mg Nebulization TID  . azithromycin  500 mg Intravenous Q24H  . cefTRIAXone (ROCEPHIN)  IV  1 g Intravenous Q24H  . enoxaparin (LOVENOX) injection  80 mg Subcutaneous Q24H  . guaiFENesin  1,200 mg Oral BID  . ipratropium  0.5 mg Nebulization TID  . senna-docusate  1 tablet Oral Daily  . [DISCONTINUED] albuterol  2.5 mg Nebulization Q4H  . [DISCONTINUED] ipratropium  0.5 mg Nebulization Q4H   Assessment: 52 yo obese M currently on day#5 Rocephin/Zithromax for CAP without improvement in sx or CXR.  Cx data negative to date. Empirically broadening coverage with Vancomycin.   Goal of Therapy:  Vancomycin trough level 15-20 mcg/ml  Plan:  1) Vancomycin 2500mg  IV x1 now then 1500mg  IV q12h 2) Check Vancomycin trough at steady state 3) Monitor renal function and cx data  4) Continue Rocephin & Zithromax per MD  Elson Clan 07/18/2012,10:23 AM

## 2012-07-18 NOTE — Progress Notes (Addendum)
Subjective:  The patient is sitting up in bed. He says that he feels about the same. He wants to go home. However, he had knowledge is that he is still short of breath with minimal activity.  Objective: Vital signs in last 24 hours: Filed Vitals:   07/18/12 0600 07/18/12 0727 07/18/12 0732 07/18/12 0800  BP: 126/71   119/73  Pulse:    78  Temp:    98.6 F (37 C)  TempSrc:    Oral  Resp: 21   22  Height:      Weight: 161.6 kg (356 lb 4.2 oz)     SpO2:  91% 93%     Intake/Output Summary (Last 24 hours) at 07/18/12 0916 Last data filed at 07/18/12 0600  Gross per 24 hour  Intake    830 ml  Output   1775 ml  Net   -945 ml    Weight change:   Physical exam: General: Obese 52 year old African-American man sitting up in bed, in no acute distress. Lungs: Mildly dyspneic when speaking, otherwise his breathing appears nonlabored. He has coarse breath sounds bilaterally. No active audible wheezes per my gram. Heart: S1, S2, with no murmurs rubs or gallops. Abdomen: Morbidly obese, positive bowel sounds, soft, nontender, nondistended. Extremities: Global nonpitting edema of the right leg approximately 1+ with skin scaliness (patient says this is chronic from a previous chemical injury to his leg). No edema of his left lower extremity. Neurologic: He is alert and oriented x3. Cranial nerves II through XII are intact.  Lab Results: Basic Metabolic Panel:  Basename 07/18/12 0441 07/17/12 0442  NA 136 136  K 3.9 3.9  CL 99 100  CO2 28 29  GLUCOSE 106* 124*  BUN 9 6  CREATININE 0.66 0.68  CALCIUM 8.8 8.8  MG -- --  PHOS -- --   Liver Function Tests: No results found for this basename: AST:2,ALT:2,ALKPHOS:2,BILITOT:2,PROT:2,ALBUMIN:2 in the last 72 hours No results found for this basename: LIPASE:2,AMYLASE:2 in the last 72 hours No results found for this basename: AMMONIA:2 in the last 72 hours CBC:  Basename 07/18/12 0441 07/17/12 0442  WBC 4.5 4.9  NEUTROABS -- --  HGB  10.5* 10.5*  HCT 30.8* 30.5*  MCV 90.1 90.2  PLT 195 173   Cardiac Enzymes: No results found for this basename: CKTOTAL:3,CKMB:3,CKMBINDEX:3,TROPONINI:3 in the last 72 hours BNP:  Basename 07/16/12 1055  PROBNP 79.7   D-Dimer: No results found for this basename: DDIMER:2 in the last 72 hours CBG: No results found for this basename: GLUCAP:6 in the last 72 hours Hemoglobin A1C: No results found for this basename: HGBA1C in the last 72 hours Fasting Lipid Panel: No results found for this basename: CHOL,HDL,LDLCALC,TRIG,CHOLHDL,LDLDIRECT in the last 72 hours Thyroid Function Tests: No results found for this basename: TSH,T4TOTAL,FREET4,T3FREE,THYROIDAB in the last 72 hours Anemia Panel: No results found for this basename: VITAMINB12,FOLATE,FERRITIN,TIBC,IRON,RETICCTPCT in the last 72 hours Coagulation: No results found for this basename: LABPROT:2,INR:2 in the last 72 hours Urine Drug Screen: Drugs of Abuse  No results found for this basename: labopia,  cocainscrnur,  labbenz,  amphetmu,  thcu,  labbarb    Alcohol Level: No results found for this basename: ETH:2 in the last 72 hours Urinalysis: No results found for this basename: COLORURINE:2,APPERANCEUR:2,LABSPEC:2,PHURINE:2,GLUCOSEU:2,HGBUR:2,BILIRUBINUR:2,KETONESUR:2,PROTEINUR:2,UROBILINOGEN:2,NITRITE:2,LEUKOCYTESUR:2 in the last 72 hours Misc. Labs:   Micro: Recent Results (from the past 240 hour(s))  CULTURE, BLOOD (ROUTINE X 2)     Status: Normal (Preliminary result)   Collection Time   07/13/12  6:08 PM      Component Value Range Status Comment   Specimen Description BLOOD LEFT ANTECUBITAL   Final    Special Requests BOTTLES DRAWN AEROBIC AND ANAEROBIC 6 CC EACH   Final    Culture NO GROWTH 4 DAYS   Final    Report Status PENDING   Incomplete   CULTURE, BLOOD (ROUTINE X 2)     Status: Normal (Preliminary result)   Collection Time   07/13/12  6:09 PM      Component Value Range Status Comment   Specimen  Description BLOOD LEFT HAND   Final    Special Requests BOTTLES DRAWN AEROBIC AND ANAEROBIC 6 CC EACH   Final    Culture NO GROWTH 4 DAYS   Final    Report Status PENDING   Incomplete   MRSA PCR SCREENING     Status: Normal   Collection Time   07/13/12  7:16 PM      Component Value Range Status Comment   MRSA by PCR NEGATIVE  NEGATIVE Final     Studies/Results: Ct Chest W Contrast  07/17/2012  *RADIOLOGY REPORT*  Clinical Data: Pneumonia with shortness of breath and productive cough.  CT CHEST WITH CONTRAST  Technique:  Multidetector CT imaging of the chest was performed following the standard protocol during bolus administration of intravenous contrast.  Contrast: 80mL OMNIPAQUE IOHEXOL 300 MG/ML  SOLN the  Comparison: Chest x-ray 07/16/2012.  Findings:  Mediastinum: Heart size is mildly enlarged. There is no significant pericardial fluid, thickening or pericardial calcification. Numerous borderline and mildly enlarged mediastinal lymph nodes, largest of which is in the right paratracheal station measuring up to 11 mm in short axis.  These are nonspecific, and presumably reactive. There is a small hiatal hernia. Dilatation of the pulmonic trunk (3.8 cm in diameter), suggesting pulmonary arterial hypertension.  Lungs/Pleura: Extensive multi focal airspace consolidation is noted throughout all aspects of the lungs bilaterally, likely represents a severe multilobar pneumonia.  The pattern does not appear to be consistent with edema as there are several areas of sparing and these regions demonstrate no obvious intralobular septal thickening.  Trace right pleural effusion layering dependently.  Upper Abdomen: Unremarkable.  Musculoskeletal: There are no aggressive appearing lytic or blastic lesions noted in the visualized portions of the skeleton.  IMPRESSION: 1.  Findings in the lungs are compatible with severe multilobar pneumonia.  Mediastinal adenopathy is nonspecific, presumably reactive. 2.   Dilatation of the pulmonic trunk, suggestive of pulmonary arterial hypertension. 3.  Mild cardiomegaly.   Original Report Authenticated By: Trudie Reed, M.D.    Dg Chest Port 1 View  07/16/2012  *RADIOLOGY REPORT*  Clinical Data: Pulmonary infiltrates.  PORTABLE CHEST - 1 VIEW  Comparison: 12/28 and 07/13/2012  Findings: There has been improvement in the bilateral pulmonary infiltrates although significant infiltrate remains bilaterally. Heart size and vascularity are normal considering the AP portable technique.  No appreciable effusions.  No acute osseous abnormality.  IMPRESSION: Slight improvement in the bilateral extensive pulmonary infiltrates.   Original Report Authenticated By: Francene Boyers, M.D.     Medications:  Scheduled:    . albuterol  2.5 mg Nebulization Q4H  . azithromycin  500 mg Intravenous Q24H  . cefTRIAXone (ROCEPHIN)  IV  1 g Intravenous Q24H  . enoxaparin (LOVENOX) injection  80 mg Subcutaneous Q24H  . guaiFENesin  1,200 mg Oral BID  . ipratropium  0.5 mg Nebulization Q4H   Continuous:    . sodium chloride Stopped (07/17/12 1100)  VHQ:[id number], albuterol  Assessment: Principal Problem:  *Community acquired pneumonia Active Problems:  Acute respiratory failure with hypoxia  Hypokalemia  Dehydration  Thrombocytopenia  Anemia    1. Severe multilobar bilateral community-acquired pneumonia. CT of his chest results noted. He is afebrile and his white blood cell count is within normal limits; strep pneumo antigen and Legionella antigen negative; HIV nonreactive; influenza panel negative; but he has severe pneumonia. We'll continue antibiotics as ordered. We'll add vancomycin empirically. He does not have much bronchospasm so will decrease frequency of bronchodilators.  Acute respiratory failure with hypoxia. While speaking to him in the room while he had the 40% face mask on, his oxygen saturation ranged from 87-92%. It is possible that part of his  desaturation is secondary to hypoventilation syndrome due to to his obesity.  Normocytic anemia. This is likely secondary to acute infection. We'll continue to monitor.  Thrombocytopenia secondary to infection. Resolved.  Dehydration. Clinically resolved, status post IV fluids.  Hypokalemia. Supplemented and repleted.    Plan:  1. The patient was encouraged to use his incentive spirometry and get out of bed as much as tolerated. 2. We'll at vancomycin empirically. 3. We'll decrease the frequency of his nebulizers.    LOS: 5 days   Alwilda Gilland 07/18/2012, 9:16 AM

## 2012-07-18 NOTE — Progress Notes (Signed)
He has a visitor and doesn't want me to examine him today but I have read Dr. Theodis Aguas progress note and agree with her plans to empirically add vancomycin reduce the number of breathing treatments et Karie Soda

## 2012-07-19 MED ORDER — BIOTENE DRY MOUTH MT LIQD
15.0000 mL | Freq: Two times a day (BID) | OROMUCOSAL | Status: DC
  Administered 2012-07-20: 15 mL via OROMUCOSAL

## 2012-07-19 NOTE — Progress Notes (Signed)
He looks much better today. He is more alert. He is able to use nasal oxygen at least while he is eating and his O2 saturation has been in the 90s. He is able to cough up some sputum now  He is awake and alert. O2 saturations in the 90s on nasal oxygen but in the low 90s. His pulse is 87 blood pressure 135/74. His chest is clearer than before and he is coughing at intervals and bringing up some sputum. His heart is regular without gallop. He does not have any edema  He is improving. He has extensive bilateral pneumonia and I think this is just going to take time for him to get better. Dr. Sherrie Mustache plans to move him from the step down which I think is appropriate  I would continue treatments otherwise

## 2012-07-19 NOTE — Progress Notes (Signed)
UR Chart Review Completed  

## 2012-07-19 NOTE — Progress Notes (Signed)
Dr. Sherrie Mustache notified of pt. Refusing to walk in halls with RN or Tech.

## 2012-07-19 NOTE — Progress Notes (Signed)
Subjective: The patient says that he is doing "all right". He denies active shortness of breath or pleurisy. He told me that he sat up out of bed in the chair for approximately 8 hours yesterday.   Objective: Vital signs in last 24 hours: Filed Vitals:   07/19/12 0500 07/19/12 0700 07/19/12 0706 07/19/12 0800  BP: 115/70 120/80  135/74  Pulse: 73 66  88  Temp:      TempSrc:      Resp: 23 20  27   Height:      Weight: 159.8 kg (352 lb 4.7 oz)     SpO2: 90% 91% 92% 90%    Intake/Output Summary (Last 24 hours) at 07/19/12 4401 Last data filed at 07/19/12 0841  Gross per 24 hour  Intake   1040 ml  Output   1775 ml  Net   -735 ml    Weight change: -1.8 kg (-3 lb 15.5 oz)  Physical exam: General: Obese 52 year old African-American man sitting up in bed, in no acute distress. Lungs: Occasional wheezes in the upper lobes, decreased breath sounds in the bases. Breathing is nonlabored. Heart: S1, S2, with no murmurs rubs or gallops. Abdomen: Morbidly obese, positive bowel sounds, soft, nontender, nondistended. Extremities: Global nonpitting edema of the right leg approximately 1+ with skin scaliness (patient says this is chronic from a previous chemical injury to his leg). No edema of his left lower extremity. Neurologic: He is alert and oriented x3. Cranial nerves II through XII are intact.  Lab Results: Basic Metabolic Panel:  Basename 07/18/12 0441 07/17/12 0442  NA 136 136  K 3.9 3.9  CL 99 100  CO2 28 29  GLUCOSE 106* 124*  BUN 9 6  CREATININE 0.66 0.68  CALCIUM 8.8 8.8  MG -- --  PHOS -- --   Liver Function Tests: No results found for this basename: AST:2,ALT:2,ALKPHOS:2,BILITOT:2,PROT:2,ALBUMIN:2 in the last 72 hours No results found for this basename: LIPASE:2,AMYLASE:2 in the last 72 hours No results found for this basename: AMMONIA:2 in the last 72 hours CBC:  Basename 07/18/12 0441 07/17/12 0442  WBC 4.5 4.9  NEUTROABS -- --  HGB 10.5* 10.5*  HCT 30.8*  30.5*  MCV 90.1 90.2  PLT 195 173   Cardiac Enzymes: No results found for this basename: CKTOTAL:3,CKMB:3,CKMBINDEX:3,TROPONINI:3 in the last 72 hours BNP:  Basename 07/16/12 1055  PROBNP 79.7   D-Dimer: No results found for this basename: DDIMER:2 in the last 72 hours CBG: No results found for this basename: GLUCAP:6 in the last 72 hours Hemoglobin A1C: No results found for this basename: HGBA1C in the last 72 hours Fasting Lipid Panel: No results found for this basename: CHOL,HDL,LDLCALC,TRIG,CHOLHDL,LDLDIRECT in the last 72 hours Thyroid Function Tests: No results found for this basename: TSH,T4TOTAL,FREET4,T3FREE,THYROIDAB in the last 72 hours Anemia Panel: No results found for this basename: VITAMINB12,FOLATE,FERRITIN,TIBC,IRON,RETICCTPCT in the last 72 hours Coagulation: No results found for this basename: LABPROT:2,INR:2 in the last 72 hours Urine Drug Screen: Drugs of Abuse  No results found for this basename: labopia,  cocainscrnur,  labbenz,  amphetmu,  thcu,  labbarb    Alcohol Level: No results found for this basename: ETH:2 in the last 72 hours Urinalysis: No results found for this basename: COLORURINE:2,APPERANCEUR:2,LABSPEC:2,PHURINE:2,GLUCOSEU:2,HGBUR:2,BILIRUBINUR:2,KETONESUR:2,PROTEINUR:2,UROBILINOGEN:2,NITRITE:2,LEUKOCYTESUR:2 in the last 72 hours Misc. Labs:   Micro: Recent Results (from the past 240 hour(s))  CULTURE, BLOOD (ROUTINE X 2)     Status: Normal   Collection Time   07/13/12  6:08 PM      Component  Value Range Status Comment   Specimen Description BLOOD LEFT ANTECUBITAL   Final    Special Requests BOTTLES DRAWN AEROBIC AND ANAEROBIC 6 CC EACH   Final    Culture NO GROWTH 5 DAYS   Final    Report Status 07/18/2012 FINAL   Final   CULTURE, BLOOD (ROUTINE X 2)     Status: Normal   Collection Time   07/13/12  6:09 PM      Component Value Range Status Comment   Specimen Description BLOOD LEFT HAND   Final    Special Requests BOTTLES DRAWN  AEROBIC AND ANAEROBIC 6 CC EACH   Final    Culture NO GROWTH 5 DAYS   Final    Report Status 07/18/2012 FINAL   Final   MRSA PCR SCREENING     Status: Normal   Collection Time   07/13/12  7:16 PM      Component Value Range Status Comment   MRSA by PCR NEGATIVE  NEGATIVE Final     Studies/Results: Ct Chest W Contrast  07/17/2012  *RADIOLOGY REPORT*  Clinical Data: Pneumonia with shortness of breath and productive cough.  CT CHEST WITH CONTRAST  Technique:  Multidetector CT imaging of the chest was performed following the standard protocol during bolus administration of intravenous contrast.  Contrast: 80mL OMNIPAQUE IOHEXOL 300 MG/ML  SOLN the  Comparison: Chest x-ray 07/16/2012.  Findings:  Mediastinum: Heart size is mildly enlarged. There is no significant pericardial fluid, thickening or pericardial calcification. Numerous borderline and mildly enlarged mediastinal lymph nodes, largest of which is in the right paratracheal station measuring up to 11 mm in short axis.  These are nonspecific, and presumably reactive. There is a small hiatal hernia. Dilatation of the pulmonic trunk (3.8 cm in diameter), suggesting pulmonary arterial hypertension.  Lungs/Pleura: Extensive multi focal airspace consolidation is noted throughout all aspects of the lungs bilaterally, likely represents a severe multilobar pneumonia.  The pattern does not appear to be consistent with edema as there are several areas of sparing and these regions demonstrate no obvious intralobular septal thickening.  Trace right pleural effusion layering dependently.  Upper Abdomen: Unremarkable.  Musculoskeletal: There are no aggressive appearing lytic or blastic lesions noted in the visualized portions of the skeleton.  IMPRESSION: 1.  Findings in the lungs are compatible with severe multilobar pneumonia.  Mediastinal adenopathy is nonspecific, presumably reactive. 2.  Dilatation of the pulmonic trunk, suggestive of pulmonary arterial  hypertension. 3.  Mild cardiomegaly.   Original Report Authenticated By: Trudie Reed, M.D.     Medications:  Scheduled:    . albuterol  2.5 mg Nebulization TID  . azithromycin  500 mg Intravenous Q24H  . cefTRIAXone (ROCEPHIN)  IV  1 g Intravenous Q24H  . enoxaparin (LOVENOX) injection  80 mg Subcutaneous Q24H  . guaiFENesin  1,200 mg Oral BID  . ipratropium  0.5 mg Nebulization TID  . senna-docusate  1 tablet Oral Daily  . vancomycin  1,500 mg Intravenous Q12H   Continuous:    . sodium chloride Stopped (07/17/12 1100)   [id number], albuterol  Assessment: Principal Problem:  *Community acquired pneumonia Active Problems:  Acute respiratory failure with hypoxia  Hypokalemia  Dehydration  Thrombocytopenia  Anemia    1. Severe multilobar bilateral community-acquired pneumonia. CT of his chest results noted. He is afebrile and his white blood cell count is within normal limits; strep pneumo antigen and Legionella antigen negative; HIV nonreactive; influenza panel negative; but he has severe pneumonia. We'll continue antibiotics  as ordered, as vancomycin was added 07/18/2012. We'll continue bronchodilators.  Acute respiratory failure with hypoxia. He is now on nasal cannula oxygen at 5-6 L, but he is maintaining his oxygen saturations in the low 90s. This is an improvement. It is possible that part of his hypoxia is secondary to hypoventilation syndrome due to to his obesity.  Normocytic anemia. This is likely secondary to acute infection. We'll continue to monitor.  Thrombocytopenia secondary to infection. Resolved.  Dehydration. Clinically resolved, status post IV fluids.  Hypokalemia. Supplemented and repleted.    Plan:  1. Will transferred to telemetry. 2. Last nursing staff to ambulate the patient and to record his oxygen saturations on nasal cannula oxygen and on room air. 3. Consider discharge tomorrow. We'll check laboratory studies in the  morning.    LOS: 6 days   Elden Brucato 07/19/2012, 8:52 AM

## 2012-07-19 NOTE — Progress Notes (Signed)
Report called to Sharyn Creamer, RN department 300. Patient transferred to 317 in stable condition via wheelchair.

## 2012-07-19 NOTE — Progress Notes (Signed)
Dr. Sherrie Mustache notified of pt. Still refusing to walk in halls with RN or Tech.

## 2012-07-20 LAB — BASIC METABOLIC PANEL
CO2: 28 mEq/L (ref 19–32)
Calcium: 9.5 mg/dL (ref 8.4–10.5)
Creatinine, Ser: 0.73 mg/dL (ref 0.50–1.35)
GFR calc Af Amer: >90 mL/min (ref >90)
GFR calc non Af Amer: >90 mL/min (ref >90)

## 2012-07-20 LAB — CBC
MCV: 90.4 fL (ref 78.0–100.0)
Platelets: 285 10*3/uL (ref 150–400)
RDW: 12.6 % (ref 11.5–15.5)
WBC: 4.1 10*3/uL (ref 4.0–10.5)

## 2012-07-20 LAB — IRON AND TIBC
Iron: 86 ug/dL (ref 42–135)
Saturation Ratios: 36 % (ref 20–55)

## 2012-07-20 MED ORDER — LEVOFLOXACIN 750 MG PO TABS: 750.0000 mg | ORAL_TABLET | Freq: Every day | ORAL | Status: DC

## 2012-07-20 MED ORDER — ALBUTEROL SULFATE HFA 108 (90 BASE) MCG/ACT IN AERS
2.0000 | INHALATION_SPRAY | RESPIRATORY_TRACT | Status: DC
  Administered 2012-07-20: 2 via RESPIRATORY_TRACT
  Filled 2012-07-20: qty 6.7

## 2012-07-20 MED ORDER — CEFUROXIME AXETIL 500 MG PO TABS: 500.0000 mg | ORAL_TABLET | Freq: Two times a day (BID) | ORAL | Status: DC

## 2012-07-20 MED ORDER — ALBUTEROL SULFATE HFA 108 (90 BASE) MCG/ACT IN AERS: 2.0000 | INHALATION_SPRAY | RESPIRATORY_TRACT | Status: DC | PRN

## 2012-07-20 MED ORDER — BENZONATATE 100 MG PO CAPS: 100.0000 mg | ORAL_CAPSULE | Freq: Three times a day (TID) | ORAL | Status: DC | PRN

## 2012-07-20 MED ORDER — CEFUROXIME AXETIL 250 MG PO TABS
500.0000 mg | ORAL_TABLET | Freq: Two times a day (BID) | ORAL | Status: DC
  Administered 2012-07-20: 500 mg via ORAL
  Filled 2012-07-20: qty 2

## 2012-07-20 NOTE — Discharge Summary (Addendum)
Physician Discharge Summary  Fred Mcintosh HYQ:[id number] DOB: 10-Jan-1961 DOA: 07/13/2012  PCP: Geraldo Pitter, MD  Admit date: 07/13/2012 Discharge date: 07/20/2012  Time spent: Greater than 30 minutes  Recommendations for Outpatient Follow-up:  1. The patient will followup with Dr. Parke Simmers as scheduled. 2. The patient was advised to return to work in 2 weeks.  Discharge Diagnoses:  1. Severe bilateral community-acquired pneumonia. 2. Acute respiratory failure with hypoxia. Treated without intubation. Resolved. 3. Dehydration on admission. Resolved. 4. Thrombocytopenia and leukopenia, secondary to infection. Resolved. 5. Normocytic anemia, likely secondary to acute illness. 6. Obesity. 7. Hyponatremia secondary to dehydration. 8. Hypokalemia. Supplement and repleted.  Discharge Condition: Improved.  Diet recommendation: Heart healthy.  Filed Weights   07/16/12 0443 07/18/12 0600 07/19/12 0500  Weight: 160.8 kg (354 lb 8 oz) 161.6 kg (356 lb 4.2 oz) 159.8 kg (352 lb 4.7 oz)    History of present illness:  The patient is a 52 year old man without any chronic medical conditions, who presented to the emergency department on 07/13/2012 with a chief complaint of shortness of breath, generalized weakness, a productive cough, and subjective fever and chills. In the emergency department, his oxygen saturation was 74% on room air. His lab data revealed an a pH of 7.4, PCO2 of 47, and PO2 of 58 on 4 to half liters of oxygen; serum sodium of 133; serum potassium of 3.3; WBC of 3.4; hemoglobin of 12.8; and platelet count of 95. His EKG revealed normal sinus rhythm with a heart rate of 89 beats per minute. His chest x-ray revealed bilateral bronchopneumonia. He was admitted for further evaluation and management.  Hospital Course:  The patient was admitted to the ICU. IV ceftriaxone and azithromycin were ordered. Tamiflu was started empirically. IV fluid hydration was started as well. Oral  potassium chloride was given to replete his serum potassium. Oxygen was titrated to keep his oxygen saturations above 90%. Bronchodilator therapy was started with albuterol and Atrovent nebulizers. For further evaluation, a number of studies were ordered. His proBNP was within normal limits at 13. His influenza panel was negative. His HIV was nonreactive. His urine Legionella antigen and strep pneumo antigen were both negative. Blood cultures have remained negative to date. Several days after admission, he continues to be hypoxic requiring a 40% Ventimask. Therefore, a CT scan of his chest was ordered for further evaluation. The findings were compatible with severe multilobar bilateral pneumonia. Vancomycin was added empirically following the results of the CT scan. Pulmonologist, Dr. Juanetta Gosling was consulted. He provided recommendations that were noted and appreciated.  During the hospitalization, the patient was instructed to use the incentive spirometry and to breathe deeply to try to open up his airways. In general, he refused. He later agreed to sit out of bed to the chair for several hours. He was advised to ambulate in the hallway on multiple occasions, but he refused until he finally agreed. During the previous 24 hours of the hospitalization, his oxygen saturations ranged from 80-92% on oxygen and on room air. On the day of discharge, his oxygen saturation at rest improved to 92%.  He improved clinically and symptomatically. His thrombocytopenia resolved. His hemoglobin improved, but he was still slightly anemic which was likely the consequence of the acute infection. His serum sodium and potassium normalized. At the time of discharge, he had no complaints of shortness of breath at rest, but he did have continued shortness of breath and some fatigability with ambulation. However, he acknowledged being significantly less short  of breath compared to when he initially presented to the emergency department for  treatment. He received 8 days of intravenous antibiotics. He was discharged on 6 more days of Ceftin. Tamiflu was discontinued when his influenza panel came back negative.  Procedures:  None  Consultations:  Pulmonologist, Kari Baars, M.D.  Discharge Exam: Filed Vitals:   07/20/12 0600 07/20/12 0724 07/20/12 0810 07/20/12 0935  BP: 113/80     Pulse: 81     Temp: 97.9 F (36.6 C)     TempSrc: Oral     Resp: 20     Height:      Weight:      SpO2: 96% 94% 96% 92%    General: Obese African-American man sitting up in the chair, in no acute distress. Cardiovascular: S1, S2, with no murmurs rubs or gallops. Respiratory: Coarse breath sounds, with no audible crackles or wheezes. Breathing is nonlabored at rest.  Discharge Instructions  Discharge Orders    Future Orders Please Complete By Expires   Diet - low sodium heart healthy      Increase activity slowly      Discharge instructions      Comments:   No heavy lifting or strenuous exercise for 2 weeks. Walk and perform light activity as tolerated. Take antibiotics as prescribed until completion.       Medication List     As of 07/20/2012  1:24 PM    STOP taking these medications         ALKA-SELTZER PLUS COLD & FLU 11-16-08-250 MG Tbef   Generic drug: Phenyleph-CPM-DM-APAP      TAKE these medications         albuterol 108 (90 BASE) MCG/ACT inhaler   Commonly known as: PROVENTIL HFA;VENTOLIN HFA   Inhale 2 puffs into the lungs every 4 (four) hours as needed for wheezing or shortness of breath.      benzonatate 100 MG capsule   Commonly known as: TESSALON   Take 1 capsule (100 mg total) by mouth 3 (three) times daily as needed for cough.      cefUROXime 500 MG tablet   Commonly known as: CEFTIN   Take 1 tablet (500 mg total) by mouth 2 (two) times daily with a meal. Antibiotic to be taken for 6 more days.           Follow-up Information    Follow up with Geraldo Pitter, MD. On 07/30/2012. (Your appointment  is on 07/30/12 at 11:15 am)    Contact information:   [address] [place] [address] [phone]           The results of significant diagnostics from this hospitalization (including imaging, microbiology, ancillary and laboratory) are listed below for reference.    Significant Diagnostic Studies: Dg Chest 2 View  07/13/2012  *RADIOLOGY REPORT*  Clinical Data: Cough.  Shortness of breath.  CHEST - 2 VIEW  Comparison: None  Findings:  Heart size is normal.  Mediastinal shadows are normal. There is extensive airspace filling bilaterally consistent with bronchopneumonia.  Acute cardiogenic edema could present with a similar appearance.  No effusions.  No significant bony finding.  IMPRESSION: Bilateral airspace filling most consistent with bilateral bronchopneumonia.  Acute cardiogenic edema can present with this pattern.   Original Report Authenticated By: Paulina Fusi, M.D.    Ct Chest W Contrast  07/17/2012  *RADIOLOGY REPORT*  Clinical Data: Pneumonia with shortness of breath and productive cough.  CT CHEST WITH CONTRAST  Technique:  Multidetector CT imaging of the chest was performed following the standard protocol during bolus administration of intravenous contrast.  Contrast: 80mL OMNIPAQUE IOHEXOL 300 MG/ML  SOLN the  Comparison: Chest x-ray 07/16/2012.  Findings:  Mediastinum: Heart size is mildly enlarged. There is no significant pericardial fluid, thickening or pericardial calcification. Numerous borderline and mildly enlarged mediastinal lymph nodes, largest of which is in the right paratracheal station measuring up to 11 mm in short axis.  These are nonspecific, and presumably reactive. There is a small hiatal hernia. Dilatation of the pulmonic trunk (3.8 cm in diameter), suggesting pulmonary arterial hypertension.  Lungs/Pleura: Extensive multi focal airspace consolidation is noted throughout all aspects of the lungs bilaterally, likely represents a severe multilobar  pneumonia.  The pattern does not appear to be consistent with edema as there are several areas of sparing and these regions demonstrate no obvious intralobular septal thickening.  Trace right pleural effusion layering dependently.  Upper Abdomen: Unremarkable.  Musculoskeletal: There are no aggressive appearing lytic or blastic lesions noted in the visualized portions of the skeleton.  IMPRESSION: 1.  Findings in the lungs are compatible with severe multilobar pneumonia.  Mediastinal adenopathy is nonspecific, presumably reactive. 2.  Dilatation of the pulmonic trunk, suggestive of pulmonary arterial hypertension. 3.  Mild cardiomegaly.   Original Report Authenticated By: Trudie Reed, M.D.    Dg Chest Port 1 View  07/16/2012  *RADIOLOGY REPORT*  Clinical Data: Pulmonary infiltrates.  PORTABLE CHEST - 1 VIEW  Comparison: 12/28 and 07/13/2012  Findings: There has been improvement in the bilateral pulmonary infiltrates although significant infiltrate remains bilaterally. Heart size and vascularity are normal considering the AP portable technique.  No appreciable effusions.  No acute osseous abnormality.  IMPRESSION: Slight improvement in the bilateral extensive pulmonary infiltrates.   Original Report Authenticated By: Francene Boyers, M.D.    Dg Chest Port 1 View  07/14/2012  *RADIOLOGY REPORT*  Clinical Data: Pneumonia.  PORTABLE CHEST - 1 VIEW  Comparison: 07/13/2012  Findings: Cardiomegaly.  Bilateral airspace opacities are noted, right greater than left, slightly worsened since prior study.  This could represent asymmetric edema or infection.  No visible effusions.  No acute bony abnormality.  IMPRESSION: Cardiomegaly.  Bilateral airspace disease has increased, asymmetric edema versus infection.   Original Report Authenticated By: Charlett Nose, M.D.     Microbiology: Recent Results (from the past 240 hour(s))  CULTURE, BLOOD (ROUTINE X 2)     Status: Normal   Collection Time   07/13/12  6:08 PM       Component Value Range Status Comment   Specimen Description BLOOD LEFT ANTECUBITAL   Final    Special Requests BOTTLES DRAWN AEROBIC AND ANAEROBIC 6 CC EACH   Final    Culture NO GROWTH 5 DAYS   Final    Report Status 07/18/2012 FINAL   Final   CULTURE, BLOOD (ROUTINE X 2)     Status: Normal   Collection Time   07/13/12  6:09 PM      Component Value Range Status Comment   Specimen Description BLOOD LEFT HAND   Final    Special Requests BOTTLES DRAWN AEROBIC AND ANAEROBIC 6 CC EACH   Final    Culture NO GROWTH 5 DAYS   Final    Report Status 07/18/2012 FINAL   Final   MRSA PCR SCREENING     Status: Normal   Collection Time   07/13/12  7:16 PM      Component Value Range Status  Comment   MRSA by PCR NEGATIVE  NEGATIVE Final      Labs: Basic Metabolic Panel:  Lab 07/20/12 1610 07/18/12 0441 07/17/12 0442 07/16/12 0434 07/15/12 0444  NA 138 136 136 139 137  K 4.5 3.9 3.9 3.6 3.5  CL 103 99 100 101 99  CO2 28 28 29  32 31  GLUCOSE 116* 106* 124* 128* 124*  BUN 8 9 6  5* 6  CREATININE 0.73 0.66 0.68 0.78 0.78  CALCIUM 9.5 8.8 8.8 8.4 8.0*  MG -- -- -- -- --  PHOS -- -- -- -- --   Liver Function Tests:  Lab 07/14/12 0458  AST 101*  ALT 26  ALKPHOS 50  BILITOT 0.8  PROT 6.5  ALBUMIN 3.0*   No results found for this basename: LIPASE:5,AMYLASE:5 in the last 168 hours No results found for this basename: AMMONIA:5 in the last 168 hours CBC:  Lab 07/20/12 0442 07/18/12 0441 07/17/12 0442 07/16/12 0434 07/15/12 0444 07/13/12 1625  WBC 4.1 4.5 4.9 5.4 3.8* --  NEUTROABS -- -- -- -- -- 2.0  HGB 11.1* 10.5* 10.5* 10.4* 10.7* --  HCT 32.9* 30.8* 30.5* 30.6* 30.9* --  MCV 90.4 90.1 90.2 90.8 90.1 --  PLT 285 195 173 148* 118* --   Cardiac Enzymes: No results found for this basename: CKTOTAL:5,CKMB:5,CKMBINDEX:5,TROPONINI:5 in the last 168 hours BNP: BNP (last 3 results)  Basename 07/16/12 1055 07/13/12 1809  PROBNP 79.7 13.0   CBG: No results found for this basename:  GLUCAP:5 in the last 168 hours     Signed:  Breeze Angell  [facility] 07/20/2012, 1:24 PM

## 2012-07-20 NOTE — Progress Notes (Addendum)
Pts O2 sat is 96% on 4L at rest at 0810, O2 rechecked at 0825, O2 sat is 82-87% initially, consistently at 86%. After about 1 min of reading, O2 came up to 90%, but went back down to 87% quickly. Sheryn Bison

## 2012-07-20 NOTE — Progress Notes (Signed)
Pt verbalizes understanding of d/c instructions, prescriptions and follow up appt information. No questions at this time. IV and Telemetry d/c. I took pt to his car via wheelchair, accompanied by his friend. Sheryn Bison

## 2012-08-22 ENCOUNTER — Ambulatory Visit (HOSPITAL_COMMUNITY)
Admission: RE | Admit: 2012-08-22 | Discharge: 2012-08-22 | Disposition: A | Discharge status: Home / Self Care | Payer: BC Managed Care – PPO | Source: Ambulatory Visit | Attending: Family Medicine | Admitting: Family Medicine

## 2012-08-22 ENCOUNTER — Other Ambulatory Visit (HOSPITAL_COMMUNITY): Payer: Self-pay | Admitting: Family Medicine

## 2012-08-22 DIAGNOSIS — J189 Pneumonia, unspecified organism: Secondary | ICD-10-CM | POA: Insufficient documentation

## 2014-03-05 ENCOUNTER — Encounter (HOSPITAL_COMMUNITY): Payer: Self-pay | Admitting: Emergency Medicine

## 2014-03-05 ENCOUNTER — Emergency Department (HOSPITAL_COMMUNITY)
Admission: EM | Admit: 2014-03-05 | Discharge: 2014-03-05 | Disposition: A | Discharge status: Home / Self Care | Payer: Worker's Compensation | Attending: Emergency Medicine | Admitting: Emergency Medicine

## 2014-03-05 DIAGNOSIS — W268XXA Contact with other sharp object(s), not elsewhere classified, initial encounter: Secondary | ICD-10-CM | POA: Insufficient documentation

## 2014-03-05 DIAGNOSIS — Z8701 Personal history of pneumonia (recurrent): Secondary | ICD-10-CM | POA: Diagnosis not present

## 2014-03-05 DIAGNOSIS — R51 Headache: Secondary | ICD-10-CM | POA: Diagnosis not present

## 2014-03-05 DIAGNOSIS — Y9389 Activity, other specified: Secondary | ICD-10-CM | POA: Diagnosis not present

## 2014-03-05 DIAGNOSIS — Y990 Civilian activity done for income or pay: Secondary | ICD-10-CM | POA: Insufficient documentation

## 2014-03-05 DIAGNOSIS — J45909 Unspecified asthma, uncomplicated: Secondary | ICD-10-CM | POA: Insufficient documentation

## 2014-03-05 DIAGNOSIS — S01501A Unspecified open wound of lip, initial encounter: Secondary | ICD-10-CM | POA: Insufficient documentation

## 2014-03-05 DIAGNOSIS — Y9289 Other specified places as the place of occurrence of the external cause: Secondary | ICD-10-CM | POA: Insufficient documentation

## 2014-03-05 DIAGNOSIS — S01511A Laceration without foreign body of lip, initial encounter: Secondary | ICD-10-CM

## 2014-03-05 DIAGNOSIS — Z79899 Other long term (current) drug therapy: Secondary | ICD-10-CM | POA: Insufficient documentation

## 2014-03-05 HISTORY — DX: Unspecified asthma, uncomplicated: J45.909

## 2014-03-05 NOTE — ED Notes (Signed)
Pt states he was at work when he got hit in the upper and lower lip with a pipe that he was using to push some plastic through

## 2014-03-05 NOTE — ED Provider Notes (Signed)
CSN: [id number]     Arrival date & time 03/05/14  0214 History   First MD Initiated Contact with Patient 03/05/14 0253     Chief Complaint  Patient presents with  . Lip Laceration      Patient is a 53 y.o. male presenting with skin laceration. The history is provided by the patient.  Laceration Location:  Face Facial laceration location:  Lip Pain details:    Quality:  Aching   Severity:  Mild   Timing:  Constant   Progression:  Unchanged Relieved by:  None tried Worsened by:  Nothing tried pt was at work and a pipe accidentally hit him in mouth, just prior to arrival to the ER No LOC He reports mild HA He reports laceration to upper and lower lip No dental injury reported  Past Medical History  Diagnosis Date  . Acute respiratory failure with hypoxia 07/13/2012    Secondary to multi-lobar bilateral pneumonia  . Community acquired pneumonia 07/13/2012    Bilateral/multi-lobar  . Asthma    History reviewed. No pertinent past surgical history. No family history on file. History  Substance Use Topics  . Smoking status: Never Smoker   . Smokeless tobacco: Not on file  . Alcohol Use: No    Review of Systems  Skin: Positive for wound.  Neurological: Positive for headaches. Negative for weakness.      Allergies  Review of patient's allergies indicates no known allergies.  Home Medications   Prior to Admission medications   Medication Sig Start Date End Date Taking? Authorizing Provider  albuterol (PROVENTIL HFA;VENTOLIN HFA) 108 (90 BASE) MCG/ACT inhaler Inhale 2 puffs into the lungs every 4 (four) hours as needed for wheezing or shortness of breath. 07/20/12   Elliot Cousinenise Fisher, MD  benzonatate (TESSALON PERLES) 100 MG capsule Take 1 capsule (100 mg total) by mouth 3 (three) times daily as needed for cough. 07/20/12   Elliot Cousinenise Fisher, MD  cefUROXime (CEFTIN) 500 MG tablet Take 1 tablet (500 mg total) by mouth 2 (two) times daily with a meal. Antibiotic to be taken for 6  more days. 07/20/12   Elliot Cousinenise Fisher, MD   BP 148/83  Pulse 76  Temp(Src) 98.7 F (37.1 C) (Oral)  Resp 18  Ht 6\' 2"  (1.88 m)  Wt 380 lb (172.367 kg)  BMI 48.77 kg/m2  SpO2 98% Physical Exam CONSTITUTIONAL: Well developed/well nourished HEAD: Normocephalic/atraumatic EYES: EOMI/PERRL ENMT: Mucous membranes moist. Small laceration to right upper lip Small laceration to lower buccal mucosa No active bleeding.  No dental injury.  No nasal tenderness/septal hematoma noted.  No malocclusion. Midface is stable   NECK: supple no meningeal signs CV: S1/S2 noted, no murmurs/rubs/gallops noted LUNGS: Lungs are clear to auscultation bilaterally, no apparent distress ABDOMEN: soft, nontender, no rebound or guarding NEURO: Pt is awake/alert, moves all extremitiesx4 EXTREMITIES: pulses normal, full ROM SKIN: warm, color normal PSYCH: no abnormalities of mood noted  ED Course  Procedures  Pt declines tetanus booster He is well appearing Wounds well approximated, small and I don't feel need suture Pt agreeable with plan  MDM   Final diagnoses:  Laceration of lip, initial encounter    Nursing notes including past medical history and social history reviewed and considered in documentation     Joya Gaskinsonald W Yvana Samonte, MD 03/05/14 0330

## 2014-03-05 NOTE — ED Notes (Signed)
Patient verbalizes understanding of discharge instructions and home care. Patient ambulatory out of department at this time.

## 2014-03-05 NOTE — ED Notes (Addendum)
Patient states he was pushing plastic into a grinder, and a piece of pipe hit him in the lip. Patient presents with a wound to outter right upper lip and inner lower lip. Patient denies loss of consciousness. Patient is A&OX4. Do c/o dental pain.

## 2014-09-26 IMAGING — CT CT CHEST W/ CM
2 of 3 series · 15 of 36 positions shown, 18 images · IV contrast (Omnipaque 300)
Comparison: Chest x-ray 07/16/2012.

CLINICAL DATA: Pneumonia with shortness of breath and productive
cough.

CT CHEST WITH CONTRAST
TECHNIQUE: Multidetector CT imaging of the chest was performed
following the standard protocol during bolus administration of
intravenous contrast.
Contrast: 80mL OMNIPAQUE IOHEXOL 300 MG/ML  SOLN the

[Series 2: chestroutine 5.0 b40f · axial · 0.68mm/px · z∈[-318,-88]mm · 12 of 54 slices shown, 15 images]
[im 4/54  mediastinal]
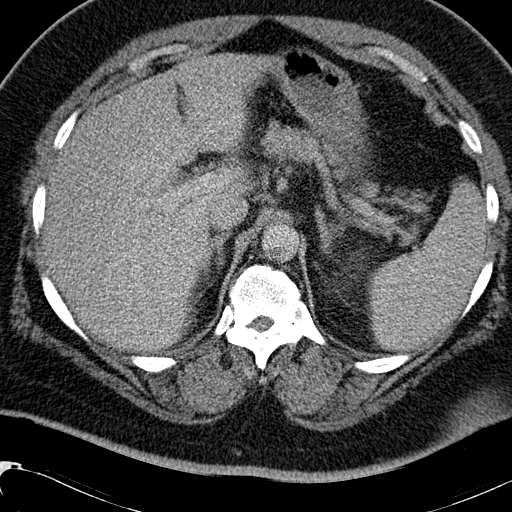
[im 4/54  lung]
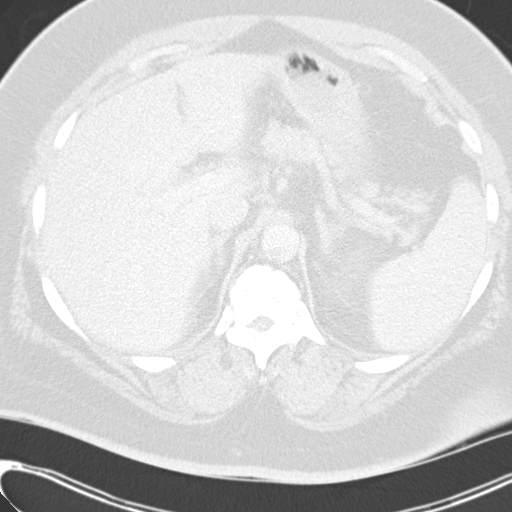
[im 8/54  lung]
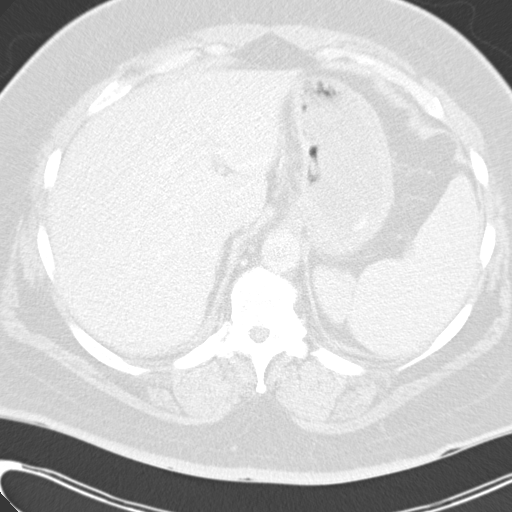
[im 12/54  lung]
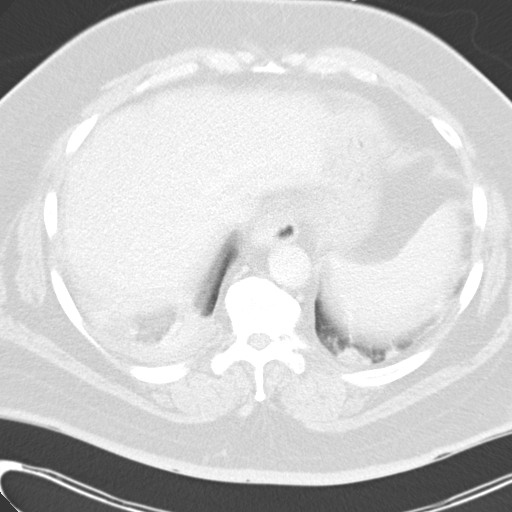
[im 16/54  lung]
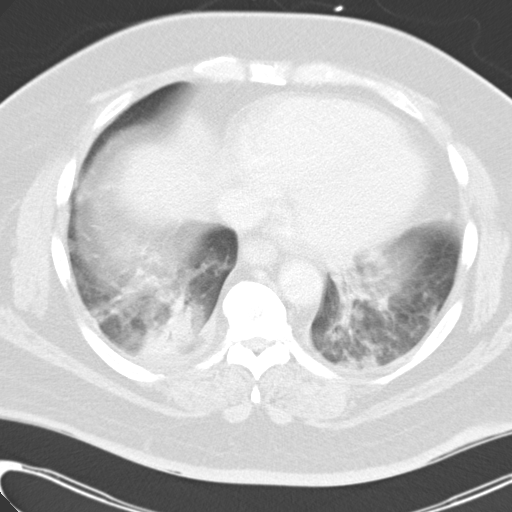
[im 20/54  mediastinal]
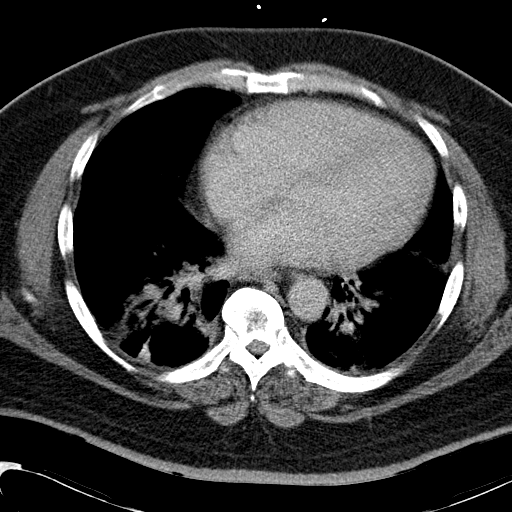
[im 20/54  lung]
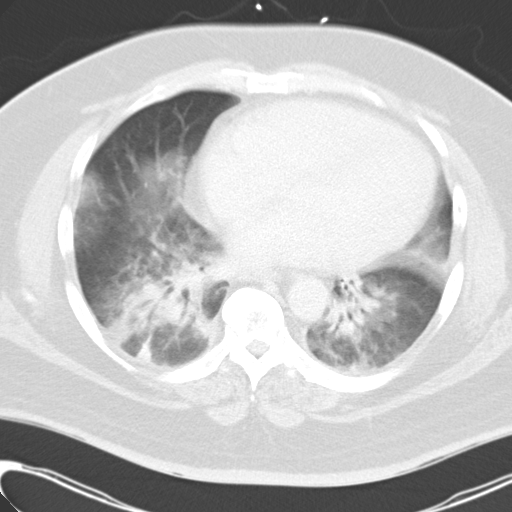
[im 24/54  lung]
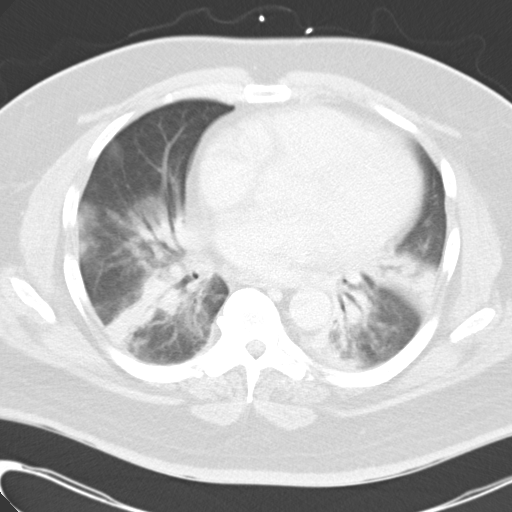
[im 30/54  lung]
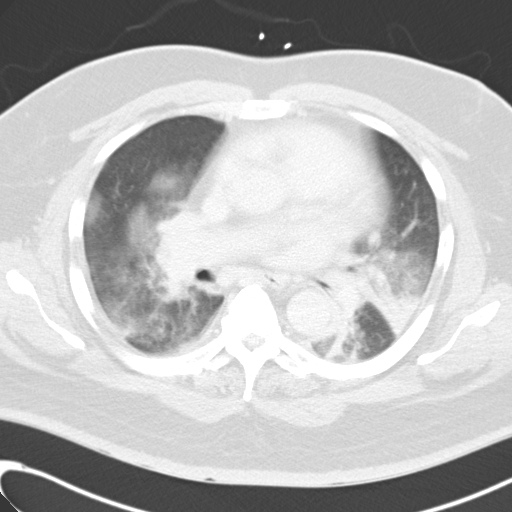
[im 34/54  lung]
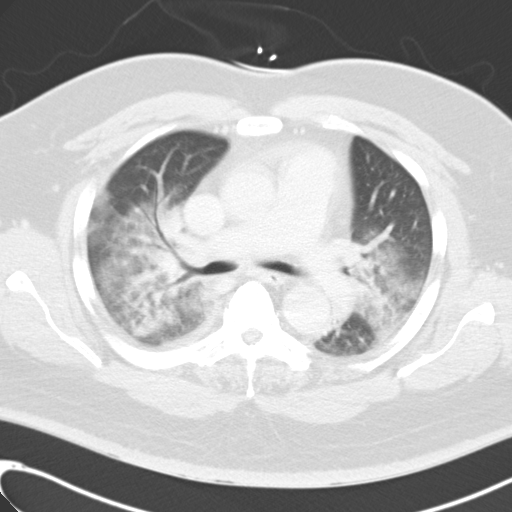
[im 38/54  mediastinal]
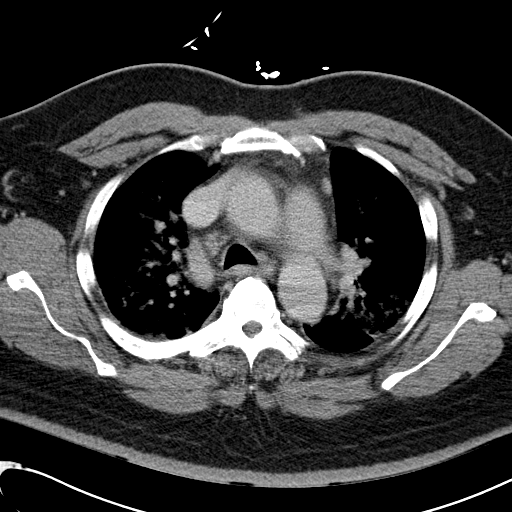
[im 38/54  lung]
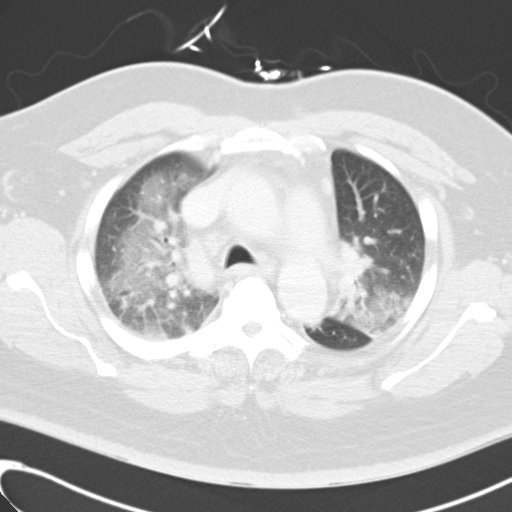
[im 42/54  lung]
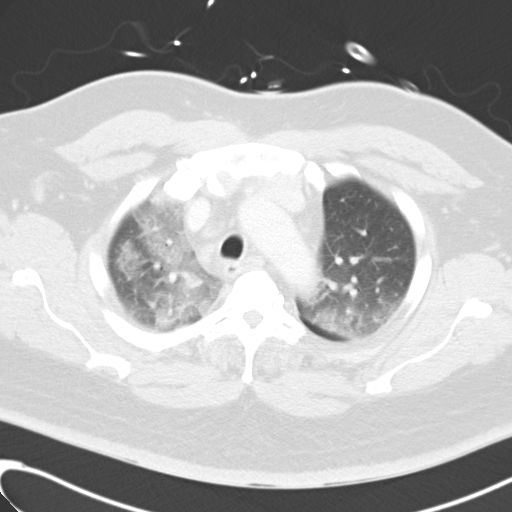
[im 46/54  lung]
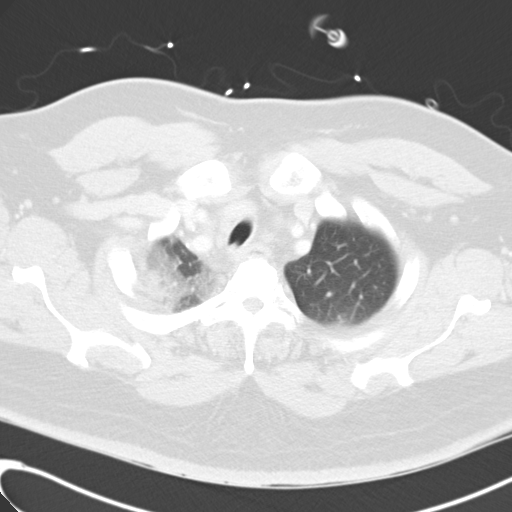
[im 50/54  lung]
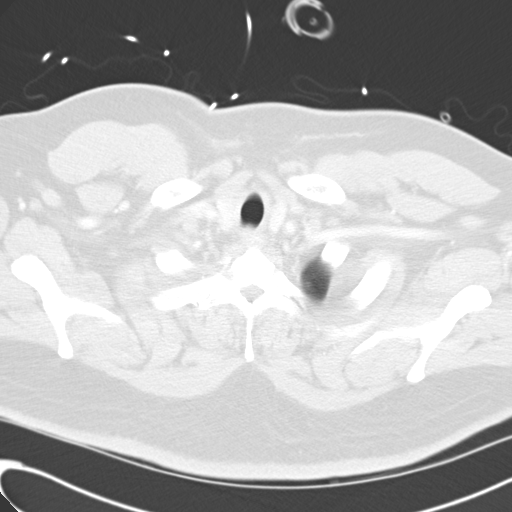

[Series 4: mpr coronal chest 3mm · coronal · 0.54mm/px · 3 of 92 slices shown]
[im 19/92  lung]
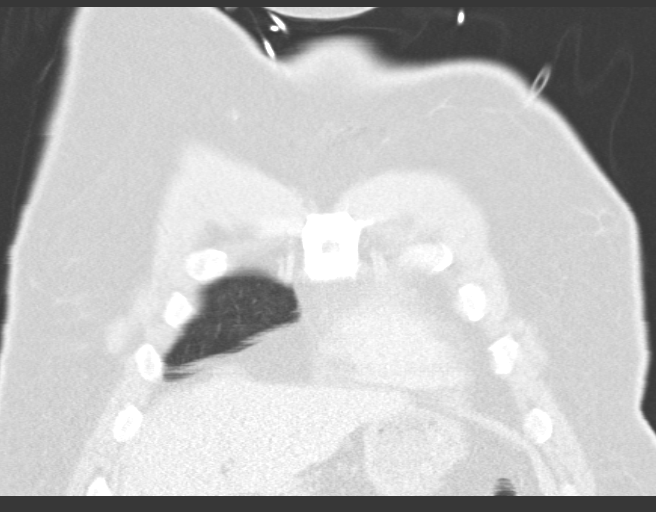
[im 37/92  lung]
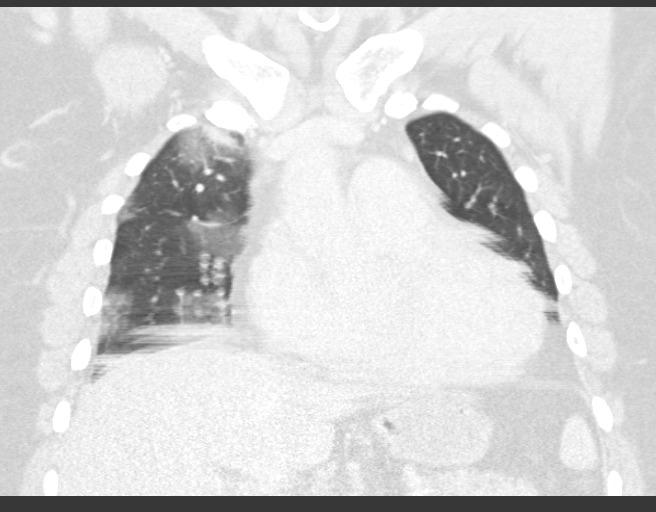
[im 55/92  lung]
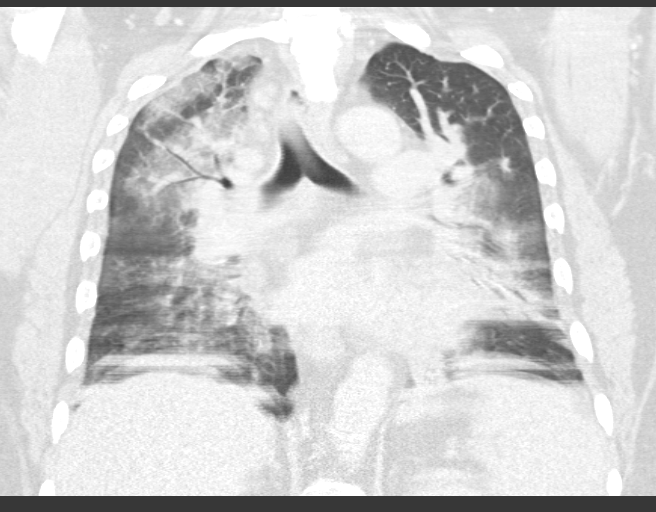

[15 of 36 positions shown; findings below may reference images not displayed]

FINDINGS: Mediastinum: Heart size is mildly enlarged. There is no significant
pericardial fluid, thickening or pericardial calcification.
Numerous borderline and mildly enlarged mediastinal lymph nodes,
largest of which is in the right paratracheal station measuring up
to 11 mm in short axis.  These are nonspecific, and presumably
reactive. There is a small hiatal hernia. Dilatation of the
pulmonic trunk (3.8 cm in diameter), suggesting pulmonary arterial
hypertension.

Lungs/Pleura: Extensive multi focal airspace consolidation is noted
throughout all aspects of the lungs bilaterally, likely represents
a severe multilobar pneumonia.  The pattern does not appear to be
consistent with edema as there are several areas of sparing and
these regions demonstrate no obvious intralobular septal
thickening.  Trace right pleural effusion layering dependently.

Upper Abdomen: Unremarkable.

Musculoskeletal: There are no aggressive appearing lytic or blastic
lesions noted in the visualized portions of the skeleton.
IMPRESSION: 1.  Findings in the lungs are compatible with severe multilobar
pneumonia.  Mediastinal adenopathy is nonspecific, presumably
reactive.
2.  Dilatation of the pulmonic trunk, suggestive of pulmonary
arterial hypertension.
3.  Mild cardiomegaly.

## 2015-09-27 ENCOUNTER — Emergency Department (HOSPITAL_COMMUNITY)
Admission: EM | Admit: 2015-09-27 | Discharge: 2015-09-28 | Disposition: A | Discharge status: Home / Self Care | Payer: No Typology Code available for payment source | Attending: Emergency Medicine | Admitting: Emergency Medicine

## 2015-09-27 ENCOUNTER — Encounter (HOSPITAL_COMMUNITY): Payer: Self-pay | Admitting: Emergency Medicine

## 2015-09-27 DIAGNOSIS — R319 Hematuria, unspecified: Secondary | ICD-10-CM | POA: Diagnosis not present

## 2015-09-27 DIAGNOSIS — J45909 Unspecified asthma, uncomplicated: Secondary | ICD-10-CM | POA: Diagnosis not present

## 2015-09-27 LAB — URINALYSIS, ROUTINE W REFLEX MICROSCOPIC
BILIRUBIN URINE: NEGATIVE
GLUCOSE, UA: NEGATIVE mg/dL
Leukocytes, UA: NEGATIVE
NITRITE: NEGATIVE
PH: 6 (ref 5.0–8.0)
SPECIFIC GRAVITY, URINE: 1.025 (ref 1.005–1.030)

## 2015-09-27 LAB — URINE MICROSCOPIC-ADD ON
Bacteria, UA: NONE SEEN
Squamous Epithelial / LPF: NONE SEEN

## 2015-09-27 NOTE — ED Notes (Signed)
Pt states he noticed bright red blood when he urinated. Denied pain.

## 2015-09-27 NOTE — ED Provider Notes (Signed)
CSN: [id number]     Arrival date & time 09/27/15  2257 History   First MD Initiated Contact with Patient 09/27/15 2329     Chief Complaint  Patient presents with  . Hematuria     (Consider location/radiation/quality/duration/timing/severity/associated sxs/prior Treatment) Patient is a 55 y.o. male presenting with hematuria. The history is provided by the patient.  Hematuria This is a new problem. The current episode started today. Episode frequency: twice tonight. The problem has been unchanged. Pertinent negatives include no abdominal pain, chills, fever, nausea or vomiting. Nothing aggravates the symptoms. He has tried nothing for the symptoms.   Willette BraceKenneth E Diener is a 55 y.o. male who presents to the ED with hematuria that he noted today. He denies dysuria, fever or other problems. He does report left flank pain that started about 2 weeks ago but he had been doing a lot of lifting and it was associated with that. That pain is still the same. Patient denies new sex partner and has not been sexually active in 3 years.   Past Medical History  Diagnosis Date  . Acute respiratory failure with hypoxia (HCC) 07/13/2012    Secondary to multi-lobar bilateral pneumonia  . Community acquired pneumonia 07/13/2012    Bilateral/multi-lobar  . Asthma    History reviewed. No pertinent past surgical history. History reviewed. No pertinent family history. Social History  Substance Use Topics  . Smoking status: Never Smoker   . Smokeless tobacco: None  . Alcohol Use: No    Review of Systems  Constitutional: Negative for fever and chills.  Gastrointestinal: Negative for nausea, vomiting and abdominal pain.  Genitourinary: Positive for hematuria and flank pain.  all other systems negative    Allergies  Review of patient's allergies indicates no known allergies.  Home Medications   Prior to Admission medications   Not on File   BP 116/70 mmHg  Pulse 75  Temp(Src) 98.4 F (36.9 C)  (Oral)  Resp 20  Ht 6\' 2"  (1.88 m)  Wt 167.831 kg  BMI 47.49 kg/m2  SpO2 96% Physical Exam  Constitutional: He is oriented to person, place, and time. He appears well-developed and well-nourished. No distress.  HENT:  Head: Normocephalic and atraumatic.  Eyes: Conjunctivae and EOM are normal.  Neck: Normal range of motion. Neck supple.  Cardiovascular: Normal rate and regular rhythm.   Pulmonary/Chest: Effort normal. No respiratory distress. He has no wheezes. He has no rales.  Abdominal: Soft. Bowel sounds are normal. There is no tenderness. There is no CVA tenderness.  No flank pain on exam  Musculoskeletal: Normal range of motion.  Neurological: He is alert and oriented to person, place, and time. No cranial nerve deficit.  Skin: Skin is warm and dry.  Psychiatric: He has a normal mood and affect. His behavior is normal.  Nursing note and vitals reviewed.   ED Course  Procedures (including critical care time) Labs Review Labs Reviewed  URINALYSIS, ROUTINE W REFLEX MICROSCOPIC (NOT AT [facility]) - Abnormal; Notable for the following:    APPearance TURBID (*)    Hgb urine dipstick LARGE (*)    Ketones, ur TRACE (*)    Protein, ur TRACE (*)    All other components within normal limits  URINE MICROSCOPIC-ADD ON - Abnormal; Notable for the following:    Crystals CA OXALATE CRYSTALS (*)    All other components within normal limits    Imaging Review Ct Renal Stone Study  09/28/2015  CLINICAL DATA:  LEFT flank pain for  2 weeks, hematuria. EXAM: CT ABDOMEN AND PELVIS WITHOUT CONTRAST TECHNIQUE: Multidetector CT imaging of the abdomen and pelvis was performed following the standard protocol without IV contrast. COMPARISON:  None. FINDINGS: Large body habitus results in overall noisy image quality. LUNG BASES: Included view of the lung bases demonstrates RIGHT lower lobe tree-in-bud infiltrates. KIDNEYS/BLADDER: Kidneys are orthotopic, demonstrating normal size and morphology. No  nephrolithiasis, hydronephrosis; limited assessment for renal masses on this nonenhanced examination. 16 mm exophytic LEFT upper pole cyst. The unopacified ureters are normal in course and caliber. Urinary bladder is partially distended and unremarkable. SOLID ORGANS: The liver, spleen, gallbladder, pancreas and adrenal glands are unremarkable for this non-contrast examination. GASTROINTESTINAL TRACT: The stomach, small and large bowel are normal in course and caliber without inflammatory changes, the sensitivity may be decreased by lack of enteric contrast. Normal appendix. PERITONEUM/RETROPERITONEUM: Aortoiliac vessels are normal in course and caliber. No lymphadenopathy by CT size criteria. Prostate is not enlarged. No intraperitoneal free fluid nor free air. Phleboliths in the pelvis. SOFT TISSUES/ OSSEOUS STRUCTURES: Nonsuspicious. Moderate bilateral fat containing inguinal hernias. 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 IMPRESSION: No urolithiasis, obstructive uropathy nor acute intra-abdominal/ pelvic process. Limited chest demonstrates RIGHT lower lobe tree-in-bud infiltrates which may be infectious or inflammatory. Electronically Signed   By: Awilda Metro M.D.   On: 09/28/2015 01:36    MDM  55 y.o. morbidly obese male with hematuria stable for d/c without kidney stone or mass noted on CT scan. He will f/u with urology. He will also f/u with his PCP for the findings of the right lower lobe that was identified on CT.patient denies cough or SOB or chest pain.  Discussed with the patient and all questioned fully answered. He will return if any problems arise.  Final diagnoses:  Hematuria        Janne Napoleon, NP 09/28/15 1610  Devoria Albe, MD 09/28/15 [phone]

## 2015-09-28 ENCOUNTER — Emergency Department (HOSPITAL_COMMUNITY): Payer: No Typology Code available for payment source

## 2015-09-28 NOTE — Discharge Instructions (Signed)
We are not sure why you have blood in your urine. You will need to follow up with the urologist for further evaluation. You will also need to follow up with Dr. Loleta ChanceHill.   Hematuria, Adult Hematuria is blood in your urine. It can be caused by a bladder infection, kidney infection, prostate infection, kidney stone, or cancer of your urinary tract. Infections can usually be treated with medicine, and a kidney stone usually will pass through your urine. If neither of these is the cause of your hematuria, further workup to find out the reason may be needed. It is very important that you tell your health care provider about any blood you see in your urine, even if the blood stops without treatment or happens without causing pain. Blood in your urine that happens and then stops and then happens again can be a symptom of a very serious condition. Also, pain is not a symptom in the initial stages of many urinary cancers. HOME CARE INSTRUCTIONS   Drink lots of fluid, 3-4 quarts a day. If you have been diagnosed with an infection, cranberry juice is especially recommended, in addition to large amounts of water.  Avoid caffeine, tea, and carbonated beverages because they tend to irritate the bladder.  Avoid alcohol because it may irritate the prostate.  Take all medicines as directed by your health care provider.  If you were prescribed an antibiotic medicine, finish it all even if you start to feel better.  If you have been diagnosed with a kidney stone, follow your health care provider's instructions regarding straining your urine to catch the stone.  Empty your bladder often. Avoid holding urine for long periods of time.  After a bowel movement, women should cleanse front to back. Use each tissue only once.  Empty your bladder before and after sexual intercourse if you are a male. SEEK MEDICAL CARE IF:  You develop back pain.  You have a fever.  You have a feeling of sickness in your stomach  (nausea) or vomiting.  Your symptoms are not better in 3 days. Return sooner if you are getting worse. SEEK IMMEDIATE MEDICAL CARE IF:   You develop severe vomiting and are unable to keep the medicine down.  You develop severe back or abdominal pain despite taking your medicines.  You begin passing a large amount of blood or clots in your urine.  You feel extremely weak or faint, or you pass out. MAKE SURE YOU:   Understand these instructions.  Will watch your condition.  Will get help right away if you are not doing well or get worse.   This information is not intended to replace advice given to you by your health care provider. Make sure you discuss any questions you have with your health care provider.   Document Released: 07/04/2005 Document Revised: 07/25/2014 Document Reviewed: 03/04/2013 Elsevier Interactive Patient Education [employer]

## 2019-08-16 ENCOUNTER — Ambulatory Visit: Payer: No Typology Code available for payment source | Attending: Internal Medicine

## 2019-08-16 ENCOUNTER — Other Ambulatory Visit: Payer: Self-pay

## 2019-08-16 DIAGNOSIS — Z20822 Contact with and (suspected) exposure to covid-19: Secondary | ICD-10-CM

## 2019-08-17 LAB — NOVEL CORONAVIRUS, NAA: SARS-CoV-2, NAA: NOT DETECTED

## 2019-08-19 ENCOUNTER — Telehealth: Payer: Self-pay

## 2019-08-19 NOTE — Telephone Encounter (Signed)
Patient called and was informed that his test for COVID-19 08/16/19 was negative. He is not infected with the Novel Coronavirus.  He verbalized understanding. He will call back with fax information. He would like his result faxed to his employer.

## 2021-12-06 ENCOUNTER — Encounter (HOSPITAL_BASED_OUTPATIENT_CLINIC_OR_DEPARTMENT_OTHER): Payer: Worker's Compensation | Attending: Internal Medicine | Admitting: Internal Medicine

## 2021-12-06 DIAGNOSIS — Y9269 Other specified industrial and construction area as the place of occurrence of the external cause: Secondary | ICD-10-CM | POA: Insufficient documentation

## 2021-12-06 DIAGNOSIS — T542X1A Toxic effect of corrosive acids and acid-like substances, accidental (unintentional), initial encounter: Secondary | ICD-10-CM | POA: Diagnosis not present

## 2021-12-06 DIAGNOSIS — L98498 Non-pressure chronic ulcer of skin of other sites with other specified severity: Secondary | ICD-10-CM | POA: Diagnosis not present

## 2021-12-06 DIAGNOSIS — S41102A Unspecified open wound of left upper arm, initial encounter: Secondary | ICD-10-CM

## 2021-12-06 DIAGNOSIS — Z6841 Body Mass Index (BMI) 40.0 and over, adult: Secondary | ICD-10-CM | POA: Insufficient documentation
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
## 2021-12-06 NOTE — Progress Notes (Signed)
Fred Mcintosh, Fred Mcintosh ([id number]) Visit Report for 12/06/2021 Abuse Risk Screen Details Patient Name: Date of Service: Fred Mcintosh, Fred Mcintosh 12/06/2021 7:45 A M Medical Record Number: [id number] Patient Account Number: [id number] Date of Birth/Sex: Treating RN: [birth year] (61 y.o. Elizebeth Koller Primary Care Griffin Gerrard: Evlyn Courier Other Clinician: Referring Perri Aragones: Treating Gillie Crisci/Extender: Corky Crafts in Treatment: 0 Abuse Risk Screen Items Answer ABUSE RISK SCREEN: Has anyone close to you tried to hurt or harm you recentlyo No Do you feel uncomfortable with anyone in your familyo No Has anyone forced you do things that you didnt want to doo No Electronic Signature(s) Signed: 12/06/2021 5:26:25 PM By: Zandra Abts RN, BSN Entered By: Zandra Abts on 12/06/2021 08:06:22 -------------------------------------------------------------------------------- Activities of Daily Living Details Patient Name: Date of Service: Fred Mcintosh, Fred Mcintosh 12/06/2021 7:45 A M Medical Record Number: [id number] Patient Account Number: [id number] Date of Birth/Sex: Treating RN: 1961-01-19 (60 y.o. Elizebeth Koller Primary Care Anaid Haney: Evlyn Courier Other Clinician: Referring Yoceline Bazar: Treating Alexavier Tsutsui/Extender: Corky Crafts in Treatment: 0 Activities of Daily Living Items Answer Activities of Daily Living (Please select one for each item) Drive Automobile Completely Able T Medications ake Completely Able Use T elephone Completely Able Care for Appearance Completely Able Use T oilet Completely Able Bath / Shower Completely Able Dress Self Completely Able Feed Self Completely Able Walk Completely Able Get In / Out Bed Completely Able Housework Completely Able Prepare Meals Completely Able Handle Money Completely Able Shop for Self Completely Able Electronic Signature(s) Signed: 12/06/2021 5:26:25 PM By: Zandra Abts RN,  BSN Entered By: Zandra Abts on 12/06/2021 08:06:36 -------------------------------------------------------------------------------- Education Screening Details Patient Name: Date of Service: Fred Gillermina Hu E. 12/06/2021 7:45 A M Medical Record Number: [id number] Patient Account Number: [id number] Date of Birth/Sex: Treating RN: [birth year] (60 y.o. Elizebeth Koller Primary Care Kemberly Taves: Evlyn Courier Other Clinician: Referring Keiko Myricks: Treating Lynzi Meulemans/Extender: Corky Crafts in Treatment: 0 Primary Learner Assessed: Patient Learning Preferences/Education Level/Primary Language Learning Preference: Explanation, Demonstration, Printed Material Highest Education Level: High School Preferred Language: English Cognitive Barrier Language Barrier: No Translator Needed: No Memory Deficit: No Emotional Barrier: No Cultural/Religious Beliefs Affecting Medical Care: No Physical Barrier Impaired Vision: No Impaired Hearing: No Decreased Hand dexterity: No Knowledge/Comprehension Knowledge Level: High Comprehension Level: High Ability to understand written instructions: High Ability to understand verbal instructions: High Motivation Anxiety Level: Calm Cooperation: Cooperative Education Importance: Acknowledges Need Interest in Health Problems: Asks Questions Perception: Coherent Willingness to Engage in Self-Management High Activities: Readiness to Engage in Self-Management High Activities: Electronic Signature(s) Signed: 12/06/2021 5:26:25 PM By: Zandra Abts RN, BSN Entered By: Zandra Abts on 12/06/2021 08:06:54 -------------------------------------------------------------------------------- Fall Risk Assessment Details Patient Name: Date of Service: Fred Mcintosh 12/06/2021 7:45 A M Medical Record Number: [id number] Patient Account Number: [id number] Date of Birth/Sex: Treating RN: June 24, 1961 (60 y.o. Elizebeth Koller Primary Care Collier Monica: Evlyn Courier Other Clinician: Referring Melvern Ramone: Treating Vista Sawatzky/Extender: Corky Crafts in Treatment: 0 Fall Risk Assessment Items Have you had 2 or more falls in the last 12 monthso 0 No Have you had any fall that resulted in injury in the last 12 monthso 0 No FALLS RISK SCREEN History of falling - immediate or within 3 months 0 No Secondary diagnosis (Do you have 2 or more medical diagnoseso) 0 No Ambulatory aid None/bed rest/wheelchair/nurse 0 Yes Crutches/cane/walker 0 No Furniture 0 No Intravenous  therapy Access/Saline/Heparin Lock 0 No Gait/Transferring Normal/ bed rest/ wheelchair 0 Yes Weak (short steps with or without shuffle, stooped but able to lift head while walking, may seek 0 No support from furniture) Impaired (short steps with shuffle, may have difficulty arising from chair, head down, impaired 0 No balance) Mental Status Oriented to own ability 0 No Electronic Signature(s) Signed: 12/06/2021 5:26:25 PM By: Zandra Abts RN, BSN Entered By: Zandra Abts on 12/06/2021 08:07:01 -------------------------------------------------------------------------------- Nutrition Risk Screening Details Patient Name: Date of Service: Fred Mcintosh, Fred Mcintosh 12/06/2021 7:45 A M Medical Record Number: [id number] Patient Account Number: [id number] Date of Birth/Sex: Treating RN: 1960/10/13 (61 y.o. Elizebeth Koller Primary Care Nekeisha Aure: Evlyn Courier Other Clinician: Referring Malisha Mabey: Treating Silvia Hightower/Extender: Corky Crafts in Treatment: 0 Height (in): 74 Weight (lbs): 335 Body Mass Index (BMI): 43 Nutrition Risk Screening Items Score Screening NUTRITION RISK SCREEN: I have an illness or condition that made me change the kind and/or amount of food I eat 0 No I eat fewer than two meals per day 0 No I eat few fruits and vegetables, or milk products 0 No I have three or more  drinks of beer, liquor or wine almost every day 0 No I have tooth or mouth problems that make it hard for me to eat 0 No I don't always have enough money to buy the food I need 0 No I eat alone most of the time 0 No I take three or more different prescribed or over-the-counter drugs a day 0 No Without wanting to, I have lost or gained 10 pounds in the last six months 0 No I am not always physically able to shop, cook and/or feed myself 0 No Nutrition Protocols Good Risk Protocol Moderate Risk Protocol 0 Provide education on nutrition High Risk Proctocol Risk Level: Good Risk Score: 0 Electronic Signature(s) Signed: 12/06/2021 5:26:25 PM By: Zandra Abts RN, BSN Entered By: Zandra Abts on 12/06/2021 08:07:07
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
## 2021-12-14 ENCOUNTER — Ambulatory Visit (HOSPITAL_BASED_OUTPATIENT_CLINIC_OR_DEPARTMENT_OTHER): Payer: No Typology Code available for payment source | Admitting: Internal Medicine

## 2024-01-18 ENCOUNTER — Inpatient Hospital Stay (HOSPITAL_COMMUNITY)
Admission: EM | Admit: 2024-01-18 | Discharge: 2024-01-23 | DRG: 854 | Disposition: A | Discharge status: Home / Self Care | Payer: MEDICAID | Attending: Internal Medicine | Admitting: Internal Medicine

## 2024-01-18 ENCOUNTER — Other Ambulatory Visit: Payer: Self-pay

## 2024-01-18 ENCOUNTER — Inpatient Hospital Stay (HOSPITAL_COMMUNITY): Payer: Self-pay

## 2024-01-18 ENCOUNTER — Emergency Department (HOSPITAL_COMMUNITY): Payer: Self-pay

## 2024-01-18 ENCOUNTER — Encounter (HOSPITAL_COMMUNITY): Payer: Self-pay

## 2024-01-18 DIAGNOSIS — L02612 Cutaneous abscess of left foot: Secondary | ICD-10-CM

## 2024-01-18 DIAGNOSIS — E11628 Type 2 diabetes mellitus with other skin complications: Secondary | ICD-10-CM | POA: Diagnosis not present

## 2024-01-18 DIAGNOSIS — E119 Type 2 diabetes mellitus without complications: Secondary | ICD-10-CM | POA: Diagnosis not present

## 2024-01-18 DIAGNOSIS — M25572 Pain in left ankle and joints of left foot: Secondary | ICD-10-CM | POA: Diagnosis not present

## 2024-01-18 DIAGNOSIS — E1140 Type 2 diabetes mellitus with diabetic neuropathy, unspecified: Secondary | ICD-10-CM | POA: Diagnosis present

## 2024-01-18 DIAGNOSIS — L03032 Cellulitis of left toe: Secondary | ICD-10-CM | POA: Diagnosis present

## 2024-01-18 DIAGNOSIS — Z743 Need for continuous supervision: Secondary | ICD-10-CM | POA: Diagnosis not present

## 2024-01-18 DIAGNOSIS — L089 Local infection of the skin and subcutaneous tissue, unspecified: Secondary | ICD-10-CM | POA: Diagnosis not present

## 2024-01-18 DIAGNOSIS — E1165 Type 2 diabetes mellitus with hyperglycemia: Secondary | ICD-10-CM | POA: Diagnosis present

## 2024-01-18 DIAGNOSIS — A419 Sepsis, unspecified organism: Secondary | ICD-10-CM | POA: Diagnosis present

## 2024-01-18 DIAGNOSIS — I96 Gangrene, not elsewhere classified: Secondary | ICD-10-CM | POA: Diagnosis present

## 2024-01-18 DIAGNOSIS — E871 Hypo-osmolality and hyponatremia: Secondary | ICD-10-CM | POA: Diagnosis present

## 2024-01-18 DIAGNOSIS — E1152 Type 2 diabetes mellitus with diabetic peripheral angiopathy with gangrene: Secondary | ICD-10-CM | POA: Diagnosis present

## 2024-01-18 DIAGNOSIS — B351 Tinea unguium: Secondary | ICD-10-CM | POA: Diagnosis present

## 2024-01-18 DIAGNOSIS — E1169 Type 2 diabetes mellitus with other specified complication: Secondary | ICD-10-CM | POA: Diagnosis present

## 2024-01-18 DIAGNOSIS — L97529 Non-pressure chronic ulcer of other part of left foot with unspecified severity: Secondary | ICD-10-CM | POA: Diagnosis present

## 2024-01-18 DIAGNOSIS — I10 Essential (primary) hypertension: Secondary | ICD-10-CM | POA: Diagnosis present

## 2024-01-18 DIAGNOSIS — E11621 Type 2 diabetes mellitus with foot ulcer: Secondary | ICD-10-CM | POA: Diagnosis present

## 2024-01-18 DIAGNOSIS — J45909 Unspecified asthma, uncomplicated: Secondary | ICD-10-CM | POA: Diagnosis present

## 2024-01-18 DIAGNOSIS — E66813 Obesity, class 3: Secondary | ICD-10-CM | POA: Diagnosis present

## 2024-01-18 DIAGNOSIS — Z6841 Body Mass Index (BMI) 40.0 and over, adult: Secondary | ICD-10-CM | POA: Diagnosis not present

## 2024-01-18 DIAGNOSIS — R609 Edema, unspecified: Secondary | ICD-10-CM | POA: Diagnosis not present

## 2024-01-18 DIAGNOSIS — M869 Osteomyelitis, unspecified: Secondary | ICD-10-CM | POA: Diagnosis present

## 2024-01-18 LAB — BASIC METABOLIC PANEL WITH GFR
Anion gap: 13 (ref 5–15)
BUN: 16 mg/dL (ref 8–23)
CO2: 25 mmol/L (ref 22–32)
Calcium: 8.8 mg/dL — ABNORMAL LOW (ref 8.9–10.3)
Chloride: 96 mmol/L — ABNORMAL LOW (ref 98–111)
Creatinine, Ser: 1.13 mg/dL (ref 0.61–1.24)
GFR, Estimated: >60 mL/min (ref >60)
Glucose, Bld: 202 mg/dL — ABNORMAL HIGH (ref 70–99)
Potassium: 4.1 mmol/L (ref 3.5–5.1)
Sodium: 134 mmol/L — ABNORMAL LOW (ref 135–145)

## 2024-01-18 LAB — CBC WITH DIFFERENTIAL/PLATELET
Abs Immature Granulocytes: 0.16 10*3/uL — ABNORMAL HIGH (ref 0.00–0.07)
Basophils Absolute: 0 10*3/uL (ref 0.0–0.1)
Basophils Relative: 0 %
Eosinophils Absolute: 0 10*3/uL (ref 0.0–0.5)
Eosinophils Relative: 0 %
HCT: 33 % — ABNORMAL LOW (ref 39.0–52.0)
Hemoglobin: 11 g/dL — ABNORMAL LOW (ref 13.0–17.0)
Immature Granulocytes: 1 %
Lymphocytes Relative: 13 %
Lymphs Abs: 1.6 10*3/uL (ref 0.7–4.0)
MCH: 31.1 pg (ref 26.0–34.0)
MCHC: 33.3 g/dL (ref 30.0–36.0)
MCV: 93.2 fL (ref 80.0–100.0)
Monocytes Absolute: 0.6 10*3/uL (ref 0.1–1.0)
Monocytes Relative: 5 %
Neutro Abs: 9.7 10*3/uL — ABNORMAL HIGH (ref 1.7–7.7)
Neutrophils Relative %: 81 %
Platelets: 229 10*3/uL (ref 150–400)
RBC: 3.54 MIL/uL — ABNORMAL LOW (ref 4.22–5.81)
RDW: 12.2 % (ref 11.5–15.5)
WBC: 12.1 10*3/uL — ABNORMAL HIGH (ref 4.0–10.5)
nRBC: 0 % (ref 0.0–0.2)

## 2024-01-18 LAB — LIPID PANEL
Cholesterol: 166 mg/dL (ref 0–200)
HDL: 15 mg/dL — ABNORMAL LOW (ref >40)
LDL Cholesterol: 127 mg/dL — ABNORMAL HIGH (ref 0–99)
Total CHOL/HDL Ratio: 11.1 ratio
Triglycerides: 119 mg/dL (ref <150)
VLDL: 24 mg/dL (ref 0–40)

## 2024-01-18 LAB — HEMOGLOBIN A1C
Hgb A1c MFr Bld: 7.5 % — ABNORMAL HIGH (ref 4.8–5.6)
Mean Plasma Glucose: 168.55 mg/dL

## 2024-01-18 LAB — GLUCOSE, CAPILLARY: Glucose-Capillary: 181 mg/dL — ABNORMAL HIGH (ref 70–99)

## 2024-01-18 LAB — LACTIC ACID, PLASMA: Lactic Acid, Venous: 1.2 mmol/L (ref 0.5–1.9)

## 2024-01-18 MED ORDER — VANCOMYCIN HCL 2000 MG/400ML IV SOLN
2000.0000 mg | Freq: Once | INTRAVENOUS | Status: AC
  Administered 2024-01-18: 2000 mg via INTRAVENOUS
  Filled 2024-01-18: qty 400

## 2024-01-18 MED ORDER — INSULIN ASPART 100 UNIT/ML IJ SOLN
0.0000 [IU] | Freq: Three times a day (TID) | INTRAMUSCULAR | Status: DC
  Administered 2024-01-19 (×2): 2 [IU] via SUBCUTANEOUS
  Administered 2024-01-19 – 2024-01-20 (×2): 1 [IU] via SUBCUTANEOUS
  Administered 2024-01-20: 3 [IU] via SUBCUTANEOUS
  Administered 2024-01-20 – 2024-01-23 (×5): 2 [IU] via SUBCUTANEOUS

## 2024-01-18 MED ORDER — ACETAMINOPHEN 650 MG RE SUPP: 650.0000 mg | Freq: Four times a day (QID) | RECTAL | Status: DC | PRN

## 2024-01-18 MED ORDER — VANCOMYCIN HCL 1250 MG/250ML IV SOLN
1250.0000 mg | Freq: Two times a day (BID) | INTRAVENOUS | Status: DC
  Administered 2024-01-19 – 2024-01-21 (×5): 1250 mg via INTRAVENOUS
  Filled 2024-01-18 (×5): qty 250

## 2024-01-18 MED ORDER — ENOXAPARIN SODIUM 40 MG/0.4ML IJ SOSY
40.0000 mg | PREFILLED_SYRINGE | INTRAMUSCULAR | Status: DC
Start: 2024-01-18 — End: 2024-01-19
  Administered 2024-01-18: 40 mg via SUBCUTANEOUS
  Filled 2024-01-18: qty 0.4

## 2024-01-18 MED ORDER — OXYCODONE HCL 5 MG PO TABS: 5.0000 mg | ORAL_TABLET | ORAL | Status: DC | PRN

## 2024-01-18 MED ORDER — ACETAMINOPHEN 325 MG PO TABS
650.0000 mg | ORAL_TABLET | Freq: Four times a day (QID) | ORAL | Status: DC | PRN
  Administered 2024-01-19: 650 mg via ORAL
  Filled 2024-01-18: qty 2

## 2024-01-18 MED ORDER — HYDRALAZINE HCL 20 MG/ML IJ SOLN: 5.0000 mg | INTRAMUSCULAR | Status: DC | PRN

## 2024-01-18 MED ORDER — SODIUM CHLORIDE 0.9 % IV SOLN
2.0000 g | INTRAVENOUS | Status: DC
  Administered 2024-01-18 – 2024-01-21 (×4): 2 g via INTRAVENOUS
  Filled 2024-01-18 (×4): qty 20

## 2024-01-18 MED ORDER — INSULIN ASPART 100 UNIT/ML IJ SOLN: 0.0000 [IU] | Freq: Every day | INTRAMUSCULAR | Status: DC

## 2024-01-18 MED ORDER — ACETAMINOPHEN 500 MG PO TABS
1000.0000 mg | ORAL_TABLET | Freq: Once | ORAL | Status: AC
  Administered 2024-01-18: 1000 mg via ORAL
  Filled 2024-01-18: qty 2

## 2024-01-18 NOTE — Progress Notes (Signed)
 Fred Mcintosh arrived to unit via EMS he is alert and verbally speaking with staff. There was one bed bug that was visualized while transferring to bed from stretcher. VS taken pt on room air, oriented to room, call bell, bed in low position, wheels locked, bed alarm on, and call bell within reach. Telemetry placed, verified and called into tele. Gown and non skid socks placed on pt

## 2024-01-18 NOTE — H&P (Signed)
 [facility] H&P   Patient Demographics:    Fred Mcintosh, is a 63 y.o. male  MRN: [id number]   DOB - 10/23/1960  Admit Date - 01/18/2024  Outpatient Primary MD for the patient is [name], MD  Referring MD/NP/PA: PA Idol  Patient coming from: home  Chief Complaint  Patient presents with   Toe Injury      HPI:    Fred Mcintosh  is a 63 y.o. male, without significant past medical history as he has not been seen by any physician for 15 years, patient presents to ED secondary to complaints of left second toe amputation, pain, swelling and discharge, patient reports over last 3 weeks he started to notice some swelling, pain, has involved into some discharge as well, reported had some initial improvement after starting some Neosporin but over the last 2 days it has much worsened which prompted him to come to ED. - In ED patient was noticed to have fever 101.1, workup significant for tachypnea, tachycardia and leukocytosis, as well imaging were concerning for early osteomyelitis, gas formation and surrounding cellulitis, he was noted to have hyperglycemia with glucose of 202, he denies any history of diabetes mellitus, lactic acid within normal limit.  [facility] hospitalist consulted to admit.    Review of systems:     A full 10 point Review of Systems was done, except as stated above, all other Review of Systems were negative.   With Past History of the following :    Past Medical History:  Diagnosis Date   Acute respiratory failure with hypoxia (HCC) 07/13/2012   Secondary to multi-lobar bilateral pneumonia   Asthma    Community acquired pneumonia 07/13/2012   Bilateral/multi-lobar      History reviewed. No pertinent surgical history.    Social History:     Social History   Tobacco Use   Smoking status: Never    Passive exposure: Never   Smokeless tobacco: Not on  file  Substance Use Topics   Alcohol use: No      Family History :    History reviewed. No pertinent family history.   Home Medications:   Prior to Admission medications   Not on File  Not Taking any home medications   Allergies:    No Known Allergies   Physical Exam:   Vitals  Blood pressure 95/79, pulse 63, temperature 98.2 F (36.8 C), temperature source Oral, resp. rate 18, height 6' 2 (1.88 m), weight (!) 153.3 kg, SpO2 96%.   1. General Well-developed male lying in bed in NAD,   2. Normal affect and insight, Not Suicidal or Homicidal, Awake Alert, Oriented X 3.  3. No F.N deficits, ALL C.Nerves Intact, Strength 5/5 all 4 extremities, Sensation intact all 4 extremities, Plantars down going.  4. Ears and Eyes appear Normal, Conjunctivae clear, PERRLA. Moist Oral Mucosa.  5. Supple Neck, No JVD, No  cervical lymphadenopathy appriciated, No Carotid Bruits.  6. Symmetrical Chest wall movement, Good air movement bilaterally, CTAB.  7. RRR, No Gallops, Rubs or Murmurs, No Parasternal Heave.  8. Positive Bowel Sounds, Abdomen Soft, No tenderness, No organomegaly appriciated,No rebound -guarding or rigidity.  9.  Left foot with second toe discoloration, ulcer at the dorsal side surrounding erythema, significant tenderness to palpation.    10. Good muscle tone,          Data Review:    CBC Recent Labs  Lab 01/18/24 1425  WBC 12.1*  HGB 11.0*  HCT 33.0*  PLT 229  MCV 93.2  MCH 31.1  MCHC 33.3  RDW 12.2  LYMPHSABS 1.6  MONOABS 0.6  EOSABS 0.0  BASOSABS 0.0   ------------------------------------------------------------------------------------------------------------------  Chemistries  Recent Labs  Lab 01/18/24 1425  NA 134*  K 4.1  CL 96*  CO2 25  GLUCOSE 202*  BUN 16  CREATININE 1.13  CALCIUM 8.8*   ------------------------------------------------------------------------------------------------------------------ estimated creatinine  clearance is 106 mL/min (by C-G formula based on SCr of 1.13 mg/dL). ------------------------------------------------------------------------------------------------------------------ No results for input(s): TSH, T4TOTAL, T3FREE, THYROIDAB in the last 72 hours.  Invalid input(s): FREET3  Coagulation profile No results for input(s): INR, PROTIME in the last 168 hours. ------------------------------------------------------------------------------------------------------------------- No results for input(s): DDIMER in the last 72 hours. -------------------------------------------------------------------------------------------------------------------  Cardiac Enzymes No results for input(s): CKMB, TROPONINI, MYOGLOBIN in the last 168 hours.  Invalid input(s): CK ------------------------------------------------------------------------------------------------------------------ No results found for: BNP   ---------------------------------------------------------------------------------------------------------------  Urinalysis    Component Value Date/Time   COLORURINE YELLOW 09/27/2015 2325   APPEARANCEUR TURBID (A) 09/27/2015 2325   LABSPEC 1.025 09/27/2015 2325   PHURINE 6.0 09/27/2015 2325   GLUCOSEU NEGATIVE 09/27/2015 2325   HGBUR LARGE (A) 09/27/2015 2325   BILIRUBINUR NEGATIVE 09/27/2015 2325   KETONESUR TRACE (A) 09/27/2015 2325   PROTEINUR TRACE (A) 09/27/2015 2325   NITRITE NEGATIVE 09/27/2015 2325   LEUKOCYTESUR NEGATIVE 09/27/2015 2325    ----------------------------------------------------------------------------------------------------------------   Imaging Results:    US  ARTERIAL ABI (SCREENING LOWER EXTREMITY) Result Date: 01/18/2024 CLINICAL DATA:  Gangrene. Hypertension. Hyperlipidemia. Diabetes. Discoloration and wound of fractured second toe. EXAM: NONINVASIVE PHYSIOLOGIC VASCULAR STUDY OF BILATERAL LOWER EXTREMITIES TECHNIQUE:  Evaluation of both lower extremities were performed at rest, including calculation of ankle-brachial indices with single level pressure measurements and doppler recording. COMPARISON:  None available. FINDINGS: Right ABI:  1.20 Left ABI:  0.98 Right Lower Extremity:  Normal arterial waveforms at the ankle. Left Lower Extremity:  Normal arterial waveforms at the ankle. 0.9-0.99 Borderline PAD IMPRESSION: 1. Normal RIGHT lower extremity ankle brachial indices. 2. Minimally decreased LEFT ABI indicative of borderline peripheral arterial disease. Electronically Signed   By: Aliene Lloyd M.D.   On: 01/18/2024 18:20   MR FOOT LEFT WO CONTRAST Result Date: 01/18/2024 EXAM DESCRIPTION: MR FOOT LEFT WO CONTRAST CLINICAL HISTORY: Soft tissue infection suspected, foot, xray done COMPARISON: None Available. TECHNIQUE: MRI of the foot is performed according to our usual protocol with multiplanar multi sequence imaging. FINDINGS: There is a soft tissue wound about the tuft of the second digit. Cortical erosion/destruction to the tuft with moderate marrow edema. Mild marrow edema to the middle phalanx which is probably reactive. There is a gas within the plantar soft tissues about the DIP joint consistent with gas forming soft tissue infection. Moderate phlegmonous change. No organized fluid collection. Moderate degenerative change to the dorsal second tarsometatarsal joint and milder to the third and fourth. Also mild to moderate degenerative change first  MTP joint and milder to the interphalangeal joint. Mild to moderate diffuse myositis likely reactive. Moderate dorsal subcutaneous edema suggesting cellulitis. IMPRESSION: Soft tissue wound/ulceration about the tuft of the second digit. Moderate phlegmonous changes as well as gas within the soft tissues. Cortical erosion/destruction to the tuft consistent with osteomyelitis. There is mild marrow edema to the middle phalanx which is probably reactive. Early osteomyelitis should  be excluded. Moderate dorsal subcutaneous edema suggesting cellulitis. Degenerative change greatest to the dorsal second tarsometatarsal joint. Electronically signed by: Reyes Frees MD 01/18/2024 06:03 PM EDT RP Workstation: [id number]   DG Facial Bones 1-2 Views Result Date: 01/18/2024 CLINICAL DATA:  Metal screening prior to MRI EXAM: FACIAL BONES - 1-2 VIEW COMPARISON:  None Available. FINDINGS: There is no evidence of metallic foreign body within the orbits. Probable dental filling. No significant bone abnormality identified. IMPRESSION: No evidence of metallic foreign body within the orbits. Electronically Signed   By: Andrea Gasman M.D.   On: 01/18/2024 18:02   DG Toe 2nd Left Result Date: 01/18/2024 CLINICAL DATA:  Injury several days ago, pain and swelling EXAM: LEFT SECOND TOE COMPARISON:  None Available. FINDINGS: Frontal, oblique, and lateral views of the left second toe are obtained. There is extensive soft tissue swelling throughout the second and third digits, as well as throughout the dorsum of the left forefoot. Extensive subcutaneous gas is seen throughout the second digit a extending to the base of the third digit. There are no radiopaque foreign bodies. There is evidence of a minimally displaced fracture involving the distal tuft of the second distal phalanx. IMPRESSION: 1. Minimally displaced fracture through the distal tuft of the second distal phalanx. 2. Marked soft tissue swelling of the second and third digits, with subcutaneous gas as above. While this could reflect an open fracture and subcutaneous gas related to initial trauma, underlying infection with gas-forming organism is suspected. Electronically Signed   By: Ozell Daring M.D.   On: 01/18/2024 13:51     Assessment & Plan:    Principal Problem:   Toe infection    Sepsis present on admission due to left second toe infection/osteomyelitis and surrounding cellulitis - Febrile, leukocytosis, tachypneic and  tachycardic - Follow-up blood cultures. - Continue with IV vancomycin  and Rocephin . - MRI of right foot with significant osteomyelitis, gas formation and surrounding cellulitis - Will admit to [facility] for evaluation by orthopedic Dr. Harden.  Likely will need surgical intervention - ABI has been ordered   Hyperglycemia - Patient does not have a diagnosis of diabetes mellitus but he has not been following with any physician. - Will check A1c and start on insulin sliding scale  Obesity class III - Body mass index is 43.4 kg/m.    DVT Prophylaxis  Lovenox   AM Labs Ordered, also please review Full Orders  Family Communication: Admission, patients condition and plan of care including tests being ordered have been discussed with the patient who indicate understanding and agree with the plan and Code Status.  Code Status Full  Likely DC to  home  Condition GUARDED    Consults called: Dr Harden( ortho)    Admission status: inpatient    Time spent in minutes : 70 minutes   Brayton Lye M.D on 01/18/2024 at 7:25 PM   [facility] - Office  [phone]

## 2024-01-18 NOTE — Progress Notes (Signed)
 Pharmacy Antibiotic Note  Fred Mcintosh is a 63 y.o. male admitted on 01/18/2024 with toe injury/?osteomyelitis.  Pharmacy has been consulted for vancomycin  dosing.  -developed fever Tm 101.1,  WBC 12.1 -also ordered Ceftriaxone   Plan: Patient ordered Vancomycin  2000 mg IV x 1 in ED Will continue with Vancomycin  1250 mg IV q12h  Goal AUC 400-550. Expected AUC: 467 Cmin 13.8 SCr used: 1.13 BMI 43   Vd 0.5  Continue to assess renal fxn, cultures, length of therapy, etc    Height: 6' 2 (188 cm) Weight: (!) 153.3 kg (338 lb) IBW/kg (Calculated) : 82.2  Temp (24hrs), Avg:99.4 F (37.4 C), Min:98.2 F (36.8 C), Max:101.1 F (38.4 C)  Recent Labs  Lab 01/18/24 1425 01/18/24 1653  WBC 12.1*  --   CREATININE 1.13  --   LATICACIDVEN  --  1.2    Estimated Creatinine Clearance: 106 mL/min (by C-G formula based on SCr of 1.13 mg/dL).    No Known Allergies  Antimicrobials this admission: Vancomycin  7/3 >>   ceftriaxone  7/3 >>    Dose adjustments this admission:    Microbiology results: 7/3 BCx: pending   UCx:      Sputum:      MRSA PCR:    Thank you for allowing pharmacy to be a part of this patient's care.  Allean Haas PharmD Clinical Pharmacist 01/18/2024

## 2024-01-18 NOTE — ED Triage Notes (Signed)
 Pt arrived via POV c/o injury to 2nd left toe that occurred a couple of days ago. Pt reports applying neosporin to the toe but still has pain when he ambulates and puts weight on his foot. Pt reports he accidentally cut some skin the other day when he was trimming his nails.

## 2024-01-18 NOTE — ED Provider Notes (Signed)
 [place] EMERGENCY DEPARTMENT AT [facility] Provider Note   CSN: [id number] Arrival date & time: 01/18/24  1217     Patient presents with: Toe Injury   Fred Mcintosh is a 63 y.o. male with no significant past medical history but also endorses he is not been seen by a MD in at least 15 years presenting for evaluation of left second toe pain and swelling.He states he cut his skin accidentally when trimming his nails this past week and about 2 days ago noticed swelling of the toe now with radiation of pain and swelling into his foot and ankle region.  He denies fevers or chills, nausea or vomiting.  He denies any pain except when weightbearing.  He has been applying Neosporin to the toe.   The history is provided by the patient.       Prior to Admission medications   Not on File    Allergies: Patient has no known allergies.    Review of Systems  Constitutional:  Negative for chills and fever.  HENT:  Negative for congestion.   Eyes: Negative.   Respiratory:  Negative for chest tightness and shortness of breath.   Cardiovascular:  Negative for chest pain.  Gastrointestinal:  Negative for abdominal pain and nausea.  Genitourinary: Negative.   Musculoskeletal:  Negative for arthralgias, joint swelling and neck pain.  Skin:  Positive for color change and wound. Negative for rash.  Neurological:  Negative for dizziness, weakness, light-headedness, numbness and headaches.  Psychiatric/Behavioral: Negative.      Updated Vital Signs BP 121/81   Pulse 92   Temp (!) 101.1 F (38.4 C) (Oral)   Resp 18   Ht 6' 2 (1.88 m)   Wt (!) 153.3 kg   SpO2 95%   BMI 43.40 kg/m   Physical Exam Vitals and nursing note reviewed.  Constitutional:      Appearance: He is well-developed.  HENT:     Head: Normocephalic and atraumatic.  Eyes:     Conjunctiva/sclera: Conjunctivae normal.  Cardiovascular:     Rate and Rhythm: Normal rate and regular rhythm.     Heart sounds:  Normal heart sounds.  Pulmonary:     Effort: Pulmonary effort is normal.     Breath sounds: Normal breath sounds. No wheezing.  Abdominal:     General: Bowel sounds are normal.     Palpations: Abdomen is soft.     Tenderness: There is no abdominal tenderness. There is no guarding.  Musculoskeletal:        General: Swelling present. Normal range of motion.     Cervical back: Normal range of motion.  Skin:    General: Skin is warm and dry.     Findings: Erythema and wound present.     Comments: Significant swelling of left 2nd toe,  lesser involvement of surrounding toes into foot and ankle region.  Foul smelling dc.  Onychomycosis toe nails.  Neurological:     Mental Status: He is alert.     (all labs ordered are listed, but only abnormal results are displayed) Labs Reviewed  CBC WITH DIFFERENTIAL/PLATELET - Abnormal; Notable for the following components:      Result Value   WBC 12.1 (*)    RBC 3.54 (*)    Hemoglobin 11.0 (*)    HCT 33.0 (*)    Neutro Abs 9.7 (*)    Abs Immature Granulocytes 0.16 (*)    All other components within normal limits  BASIC METABOLIC  PANEL WITH GFR - Abnormal; Notable for the following components:   Sodium 134 (*)    Chloride 96 (*)    Glucose, Bld 202 (*)    Calcium 8.8 (*)    All other components within normal limits  CULTURE, BLOOD (ROUTINE X 2)  CULTURE, BLOOD (ROUTINE X 2)  HEMOGLOBIN A1C  LACTIC ACID, PLASMA  LACTIC ACID, PLASMA    EKG: None  Radiology: DG Toe 2nd Left Result Date: 01/18/2024 CLINICAL DATA:  Injury several days ago, pain and swelling EXAM: LEFT SECOND TOE COMPARISON:  None Available. FINDINGS: Frontal, oblique, and lateral views of the left second toe are obtained. There is extensive soft tissue swelling throughout the second and third digits, as well as throughout the dorsum of the left forefoot. Extensive subcutaneous gas is seen throughout the second digit a extending to the base of the third digit. There are no  radiopaque foreign bodies. There is evidence of a minimally displaced fracture involving the distal tuft of the second distal phalanx. IMPRESSION: 1. Minimally displaced fracture through the distal tuft of the second distal phalanx. 2. Marked soft tissue swelling of the second and third digits, with subcutaneous gas as above. While this could reflect an open fracture and subcutaneous gas related to initial trauma, underlying infection with gas-forming organism is suspected. Electronically Signed   By: Ozell Daring M.D.   On: 01/18/2024 13:51     Procedures   Medications Ordered in the ED  vancomycin  (VANCOREADY) IVPB 2000 mg/400 mL ( Intravenous Infusion Verify 01/18/24 1558)  acetaminophen  (TYLENOL ) tablet 1,000 mg (1,000 mg Oral Given 01/18/24 1631)                                    Medical Decision Making Patient presenting with a pretty significant foot infection with origin of the left second toe.  Labs and imaging per below.  Imaging suggest patient has a tuft fracture, this was discussed with patient who absolutely denies any injury to the toe except for the skin injury when he was clipping his nails.  Labs confirm new onset diabetes.  He was given IV dose of vancomycin .  He will need admission, call placed to the hospitalist service.  4:32 PM Pt has now developed fever to 101.1.  blood cultures and lactic acid added.    Pt discussed with Dr. Sherlon who accepts pt for admission.   Amount and/or Complexity of Data Reviewed Labs: ordered.    Details: Initial labs are significant for WBC count of 12.1, he also has a glucose of 202. Radiology: ordered.    Details: Imaging reviewed, marked soft tissue swelling with subcu gas which is consistent with patient's exam.  Also being read as a distal tuft fracture, patient does not have a trauma history, I question whether this could represent osteomyelitis.  Risk Prescription drug management. Decision regarding hospitalization.         Final diagnoses:  Diabetic infection of left foot (HCC)  Diabetes mellitus, new onset [facility])    ED Discharge Orders     None          Birdena Mliss RIGGERS 01/18/24 1635    Dean Mliss, MD 01/19/24 [phone]

## 2024-01-18 NOTE — ED Notes (Signed)
 Pt transported to MRI

## 2024-01-18 NOTE — ED Notes (Signed)
 Placed on telemetry

## 2024-01-19 DIAGNOSIS — E66813 Obesity, class 3: Secondary | ICD-10-CM

## 2024-01-19 DIAGNOSIS — L089 Local infection of the skin and subcutaneous tissue, unspecified: Secondary | ICD-10-CM | POA: Diagnosis not present

## 2024-01-19 DIAGNOSIS — A419 Sepsis, unspecified organism: Secondary | ICD-10-CM | POA: Insufficient documentation

## 2024-01-19 DIAGNOSIS — E11628 Type 2 diabetes mellitus with other skin complications: Secondary | ICD-10-CM | POA: Diagnosis not present

## 2024-01-19 DIAGNOSIS — L02612 Cutaneous abscess of left foot: Secondary | ICD-10-CM | POA: Diagnosis not present

## 2024-01-19 DIAGNOSIS — E119 Type 2 diabetes mellitus without complications: Secondary | ICD-10-CM

## 2024-01-19 DIAGNOSIS — E871 Hypo-osmolality and hyponatremia: Secondary | ICD-10-CM

## 2024-01-19 DIAGNOSIS — I10 Essential (primary) hypertension: Secondary | ICD-10-CM

## 2024-01-19 LAB — CBC
HCT: 31 % — ABNORMAL LOW (ref 39.0–52.0)
Hemoglobin: 10.4 g/dL — ABNORMAL LOW (ref 13.0–17.0)
MCH: 30.8 pg (ref 26.0–34.0)
MCHC: 33.5 g/dL (ref 30.0–36.0)
MCV: 91.7 fL (ref 80.0–100.0)
Platelets: 301 K/uL (ref 150–400)
RBC: 3.38 MIL/uL — ABNORMAL LOW (ref 4.22–5.81)
RDW: 12.2 % (ref 11.5–15.5)
WBC: 10.6 K/uL — ABNORMAL HIGH (ref 4.0–10.5)
nRBC: 0 % (ref 0.0–0.2)

## 2024-01-19 LAB — BASIC METABOLIC PANEL WITH GFR
Anion gap: 10 (ref 5–15)
BUN: 13 mg/dL (ref 8–23)
CO2: 26 mmol/L (ref 22–32)
Calcium: 8.7 mg/dL — ABNORMAL LOW (ref 8.9–10.3)
Chloride: 98 mmol/L (ref 98–111)
Creatinine, Ser: 0.99 mg/dL (ref 0.61–1.24)
GFR, Estimated: >60 mL/min (ref >60)
Glucose, Bld: 171 mg/dL — ABNORMAL HIGH (ref 70–99)
Potassium: 3.7 mmol/L (ref 3.5–5.1)
Sodium: 134 mmol/L — ABNORMAL LOW (ref 135–145)

## 2024-01-19 LAB — GLUCOSE, CAPILLARY
Glucose-Capillary: 151 mg/dL — ABNORMAL HIGH (ref 70–99)
Glucose-Capillary: 144 mg/dL — ABNORMAL HIGH (ref 70–99)
Glucose-Capillary: 162 mg/dL — ABNORMAL HIGH (ref 70–99)
Glucose-Capillary: 177 mg/dL — ABNORMAL HIGH (ref 70–99)

## 2024-01-19 LAB — HIV ANTIBODY (ROUTINE TESTING W REFLEX): HIV Screen 4th Generation wRfx: NONREACTIVE

## 2024-01-19 LAB — MRSA NEXT GEN BY PCR, NASAL: MRSA by PCR Next Gen: NOT DETECTED

## 2024-01-19 MED ORDER — INSULIN ASPART 100 UNIT/ML IJ SOLN
4.0000 [IU] | Freq: Three times a day (TID) | INTRAMUSCULAR | Status: DC
  Administered 2024-01-19 – 2024-01-23 (×7): 4 [IU] via SUBCUTANEOUS

## 2024-01-19 MED ORDER — ENOXAPARIN SODIUM 80 MG/0.8ML IJ SOSY
75.0000 mg | PREFILLED_SYRINGE | INTRAMUSCULAR | Status: DC
  Administered 2024-01-19 – 2024-01-22 (×4): 75 mg via SUBCUTANEOUS
  Filled 2024-01-19 (×4): qty 0.75

## 2024-01-19 MED ORDER — INSULIN GLARGINE-YFGN 100 UNIT/ML ~~LOC~~ SOLN
10.0000 [IU] | SUBCUTANEOUS | Status: DC
  Administered 2024-01-19 – 2024-01-23 (×5): 10 [IU] via SUBCUTANEOUS
  Filled 2024-01-19 (×6): qty 0.1

## 2024-01-19 MED ORDER — POVIDONE-IODINE 10 % EX SWAB
2.0000 | Freq: Once | CUTANEOUS | Status: AC
  Administered 2024-01-20: 2 via TOPICAL

## 2024-01-19 MED ORDER — CHLORHEXIDINE GLUCONATE 4 % EX SOLN
60.0000 mL | Freq: Once | CUTANEOUS | Status: AC
  Administered 2024-01-19: 1 via TOPICAL
  Filled 2024-01-19: qty 15

## 2024-01-19 NOTE — Plan of Care (Signed)

## 2024-01-19 NOTE — Consult Note (Signed)
 ORTHOPAEDIC CONSULTATION  REQUESTING PHYSICIAN: Odell Celinda Balo, MD  Chief Complaint: Foul-smelling abscess left foot second toe.  HPI: Fred Mcintosh is a 63 y.o. male who presents with type 2 diabetes with abscess ulceration and foul odor left foot second toe.  Patient states he was unaware that he had any trouble with his toe.  Past Medical History:  Diagnosis Date   Acute respiratory failure with hypoxia (HCC) 07/13/2012   Secondary to multi-lobar bilateral pneumonia   Asthma    Community acquired pneumonia 07/13/2012   Bilateral/multi-lobar   History reviewed. No pertinent surgical history. Social History   Socioeconomic History   Marital status: Single    Spouse name: Not on file   Number of children: Not on file   Years of education: Not on file   Highest education level: Not on file  Occupational History   Not on file  Tobacco Use   Smoking status: Never    Passive exposure: Never   Smokeless tobacco: Not on file  Vaping Use   Vaping status: Never Used  Substance and Sexual Activity   Alcohol use: No   Drug use: No   Sexual activity: Not on file  Other Topics Concern   Not on file  Social History Narrative   Not on file   Social Drivers of Health   Financial Resource Strain: Not on file  Food Insecurity: No Food Insecurity (01/18/2024)   Hunger Vital Sign    Worried About Running Out of Food in the Last Year: Never true    Ran Out of Food in the Last Year: Never true  Transportation Needs: No Transportation Needs (01/18/2024)   PRAPARE - Administrator, Civil Service (Medical): No    Lack of Transportation (Non-Medical): No  Physical Activity: Not on file  Stress: Not on file  Social Connections: Moderately Integrated (01/18/2024)   Social Connection and Isolation Panel    Frequency of Communication with Friends and Family: More than three times a week    Frequency of Social Gatherings with Friends and Family: More than three  times a week    Attends Religious Services: More than 4 times per year    Active Member of [employer] or Organizations: Yes    Attends Banker Meetings: Never    Marital Status: Never married   History reviewed. No pertinent family history. - negative except otherwise stated in the family history section No Known Allergies Prior to Admission medications   Not on File   US  ARTERIAL ABI (SCREENING LOWER EXTREMITY) Result Date: 01/18/2024 CLINICAL DATA:  Gangrene. Hypertension. Hyperlipidemia. Diabetes. Discoloration and wound of fractured second toe. EXAM: NONINVASIVE PHYSIOLOGIC VASCULAR STUDY OF BILATERAL LOWER EXTREMITIES TECHNIQUE: Evaluation of both lower extremities were performed at rest, including calculation of ankle-brachial indices with single level pressure measurements and doppler recording. COMPARISON:  None available. FINDINGS: Right ABI:  1.20 Left ABI:  0.98 Right Lower Extremity:  Normal arterial waveforms at the ankle. Left Lower Extremity:  Normal arterial waveforms at the ankle. 0.9-0.99 Borderline PAD IMPRESSION: 1. Normal RIGHT lower extremity ankle brachial indices. 2. Minimally decreased LEFT ABI indicative of borderline peripheral arterial disease. Electronically Signed   By: Aliene Lloyd M.D.   On: 01/18/2024 18:20   MR FOOT LEFT WO CONTRAST Result Date: 01/18/2024 EXAM DESCRIPTION: MR FOOT LEFT WO CONTRAST CLINICAL HISTORY: Soft tissue infection suspected, foot, xray done COMPARISON: None Available. TECHNIQUE: MRI of the foot is performed according to our usual  protocol with multiplanar multi sequence imaging. FINDINGS: There is a soft tissue wound about the tuft of the second digit. Cortical erosion/destruction to the tuft with moderate marrow edema. Mild marrow edema to the middle phalanx which is probably reactive. There is a gas within the plantar soft tissues about the DIP joint consistent with gas forming soft tissue infection. Moderate phlegmonous change. No  organized fluid collection. Moderate degenerative change to the dorsal second tarsometatarsal joint and milder to the third and fourth. Also mild to moderate degenerative change first MTP joint and milder to the interphalangeal joint. Mild to moderate diffuse myositis likely reactive. Moderate dorsal subcutaneous edema suggesting cellulitis. IMPRESSION: Soft tissue wound/ulceration about the tuft of the second digit. Moderate phlegmonous changes as well as gas within the soft tissues. Cortical erosion/destruction to the tuft consistent with osteomyelitis. There is mild marrow edema to the middle phalanx which is probably reactive. Early osteomyelitis should be excluded. Moderate dorsal subcutaneous edema suggesting cellulitis. Degenerative change greatest to the dorsal second tarsometatarsal joint. Electronically signed by: Reyes Frees MD 01/18/2024 06:03 PM EDT RP Workstation: [id number]   DG Facial Bones 1-2 Views Result Date: 01/18/2024 CLINICAL DATA:  Metal screening prior to MRI EXAM: FACIAL BONES - 1-2 VIEW COMPARISON:  None Available. FINDINGS: There is no evidence of metallic foreign body within the orbits. Probable dental filling. No significant bone abnormality identified. IMPRESSION: No evidence of metallic foreign body within the orbits. Electronically Signed   By: Andrea Gasman M.D.   On: 01/18/2024 18:02   DG Toe 2nd Left Result Date: 01/18/2024 CLINICAL DATA:  Injury several days ago, pain and swelling EXAM: LEFT SECOND TOE COMPARISON:  None Available. FINDINGS: Frontal, oblique, and lateral views of the left second toe are obtained. There is extensive soft tissue swelling throughout the second and third digits, as well as throughout the dorsum of the left forefoot. Extensive subcutaneous gas is seen throughout the second digit a extending to the base of the third digit. There are no radiopaque foreign bodies. There is evidence of a minimally displaced fracture involving the distal tuft of  the second distal phalanx. IMPRESSION: 1. Minimally displaced fracture through the distal tuft of the second distal phalanx. 2. Marked soft tissue swelling of the second and third digits, with subcutaneous gas as above. While this could reflect an open fracture and subcutaneous gas related to initial trauma, underlying infection with gas-forming organism is suspected. Electronically Signed   By: Ozell Daring M.D.   On: 01/18/2024 13:51   - pertinent xrays, CT, MRI studies were reviewed and independently interpreted  Positive ROS: All other systems have been reviewed and were otherwise negative with the exception of those mentioned in the HPI and as above.  Physical Exam: General: Alert, no acute distress Psychiatric: Patient is competent for consent with normal mood and affect Lymphatic: No axillary or cervical lymphadenopathy Cardiovascular: No pedal edema Respiratory: No cyanosis, no use of accessory musculature GI: No organomegaly, abdomen is soft and non-tender    Images:  @ENCIMAGES @  Labs:  Lab Results  Component Value Date   HGBA1C 7.5 (H) 01/18/2024   REPTSTATUS PENDING 01/18/2024   REPTSTATUS PENDING 01/18/2024   CULT PENDING 01/18/2024   CULT PENDING 01/18/2024    Lab Results  Component Value Date   ALBUMIN 3.0 (L) 07/14/2012        Latest Ref Rng & Units 01/19/2024    5:45 AM 01/18/2024    2:25 PM 07/20/2012    4:42 AM  CBC  EXTENDED  WBC 4.0 - 10.5 K/uL 10.6  12.1  4.1   RBC 4.22 - 5.81 MIL/uL 3.38  3.54  3.64   Hemoglobin 13.0 - 17.0 g/dL 89.5  88.9  88.8   HCT 39.0 - 52.0 % 31.0  33.0  32.9   Platelets 150 - 400 K/uL 301  229  285   NEUT# 1.7 - 7.7 K/uL  9.7    Lymph# 0.7 - 4.0 K/uL  1.6      Neurologic: Patient does not have protective sensation bilateral lower extremities.   MUSCULOSKELETAL:   Skin: Examination patient has necrotic ulceration with foul-smelling drainage from the left foot second toe.  There is sausage digit swelling.  Patient has a  palpable dorsalis pedis pulse and ankle-brachial indices show normal circulation.  Review of the MRI scan shows abscess and osteomyelitis left foot second toe.  White cell count 10.6 with a hemoglobin of 10.4.  Hemoglobin A1c 7.5.  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  Plan: Plan: Will plan for a left foot second ray amputation.  Risk and benefits were discussed including risk of the wound not healing need for additional surgery.  Patient states he understands wished to proceed at this time.  Thank you for the consult and the opportunity to see Mr. Artez Regis, MD [facility] [phone] 9:03 AM

## 2024-01-19 NOTE — Progress Notes (Signed)
 [facility] PROGRESS NOTE    Progress Note  Fred Mcintosh  FMW:[id number] DOB: January 18, 1961 DOA: 01/18/2024 PCP: [name], MD     Brief Narrative:   Fred Mcintosh is an 63 y.o. male past medical history of being an unattached patient comes into the ED for left second toe amputation, pain and swelling, in the ED was found to be febrile tachypneic with leukocytosis MRI of the foot showed phlegmon and early osteomyelitis, hyperglycemic at 200   Assessment/Plan:   Sepsis present on admission secondary to cellulitis/diabetic foot early osteomyelitis: Blood cultures were ordered on admission. He was started on IV vancomycin  and Rocephin . MRI confirmed osteomyelitis. ABIs have been ordered. Orthopedic surgery has been consulted.  New onset type 2 diabetes mellitus (HCC) A1c is 7.5 will start him on long-acting insulin  per sliding scale. Will need oral hypoglycemic agents as an outpatient.  Obesity, Class III, BMI 40-49.9 (morbid obesity) Noted has been counseled.  Pseudo hyponatremia Once bili was corrected this will improve.    DVT prophylaxis: lovenox  Family Communication:none Status is: Inpatient Remains inpatient appropriate because: Sepsis due to diabetic foot    Code Status:     Code Status Orders  (From admission, onward)           Start     Ordered   01/18/24 1810  Full code  Continuous       Question:  By:  Answer:  Consent: discussion documented in EHR   01/18/24 1809           Code Status History     Date Active Date Inactive Code Status Order ID Comments User Context   07/13/2012 1914 07/20/2012 1824 Full Code [id number]  Fred Lamar Brought, RN Inpatient         IV Access:   Peripheral IV   Procedures and diagnostic studies:   US  ARTERIAL ABI (SCREENING LOWER EXTREMITY) Result Date: 01/18/2024 CLINICAL DATA:  Gangrene. Hypertension. Hyperlipidemia. Diabetes. Discoloration and wound of fractured second toe. EXAM:  NONINVASIVE PHYSIOLOGIC VASCULAR STUDY OF BILATERAL LOWER EXTREMITIES TECHNIQUE: Evaluation of both lower extremities were performed at rest, including calculation of ankle-brachial indices with single level pressure measurements and doppler recording. COMPARISON:  None available. FINDINGS: Right ABI:  1.20 Left ABI:  0.98 Right Lower Extremity:  Normal arterial waveforms at the ankle. Left Lower Extremity:  Normal arterial waveforms at the ankle. 0.9-0.99 Borderline PAD IMPRESSION: 1. Normal RIGHT lower extremity ankle brachial indices. 2. Minimally decreased LEFT ABI indicative of borderline peripheral arterial disease. Electronically Signed   By: Aliene Lloyd M.D.   On: 01/18/2024 18:20   MR FOOT LEFT WO CONTRAST Result Date: 01/18/2024 EXAM DESCRIPTION: MR FOOT LEFT WO CONTRAST CLINICAL HISTORY: Soft tissue infection suspected, foot, xray done COMPARISON: None Available. TECHNIQUE: MRI of the foot is performed according to our usual protocol with multiplanar multi sequence imaging. FINDINGS: There is a soft tissue wound about the tuft of the second digit. Cortical erosion/destruction to the tuft with moderate marrow edema. Mild marrow edema to the middle phalanx which is probably reactive. There is a gas within the plantar soft tissues about the DIP joint consistent with gas forming soft tissue infection. Moderate phlegmonous change. No organized fluid collection. Moderate degenerative change to the dorsal second tarsometatarsal joint and milder to the third and fourth. Also mild to moderate degenerative change first MTP joint and milder to the interphalangeal joint. Mild to moderate diffuse myositis likely reactive. Moderate dorsal subcutaneous edema suggesting cellulitis. IMPRESSION: Soft tissue  wound/ulceration about the tuft of the second digit. Moderate phlegmonous changes as well as gas within the soft tissues. Cortical erosion/destruction to the tuft consistent with osteomyelitis. There is mild marrow  edema to the middle phalanx which is probably reactive. Early osteomyelitis should be excluded. Moderate dorsal subcutaneous edema suggesting cellulitis. Degenerative change greatest to the dorsal second tarsometatarsal joint. Electronically signed by: Reyes Frees MD 01/18/2024 06:03 PM EDT RP Workstation: [id number]   DG Facial Bones 1-2 Views Result Date: 01/18/2024 CLINICAL DATA:  Metal screening prior to MRI EXAM: FACIAL BONES - 1-2 VIEW COMPARISON:  None Available. FINDINGS: There is no evidence of metallic foreign body within the orbits. Probable dental filling. No significant bone abnormality identified. IMPRESSION: No evidence of metallic foreign body within the orbits. Electronically Signed   By: Andrea Gasman M.D.   On: 01/18/2024 18:02   DG Toe 2nd Left Result Date: 01/18/2024 CLINICAL DATA:  Injury several days ago, pain and swelling EXAM: LEFT SECOND TOE COMPARISON:  None Available. FINDINGS: Frontal, oblique, and lateral views of the left second toe are obtained. There is extensive soft tissue swelling throughout the second and third digits, as well as throughout the dorsum of the left forefoot. Extensive subcutaneous gas is seen throughout the second digit a extending to the base of the third digit. There are no radiopaque foreign bodies. There is evidence of a minimally displaced fracture involving the distal tuft of the second distal phalanx. IMPRESSION: 1. Minimally displaced fracture through the distal tuft of the second distal phalanx. 2. Marked soft tissue swelling of the second and third digits, with subcutaneous gas as above. While this could reflect an open fracture and subcutaneous gas related to initial trauma, underlying infection with gas-forming organism is suspected. Electronically Signed   By: Ozell Daring M.D.   On: 01/18/2024 13:51     Medical Consultants:   None.   Subjective:    Fred Mcintosh relates no pain does not remember when was his last bowel  movement.  Objective:    Vitals:   01/18/24 1853 01/18/24 2024 01/19/24 0045 01/19/24 0522  BP: 95/79 122/68 120/78 125/76  Pulse: 63 85 99 91  Resp: 18 15 19 18   Temp: 98.2 F (36.8 C) 98.7 F (37.1 C) 99.6 F (37.6 C) 98.6 F (37 C)  TempSrc: Oral Oral Oral Oral  SpO2: 96% 94% 95% 98%  Weight:      Height:       SpO2: 98 %   Intake/Output Summary (Last 24 hours) at 01/19/2024 0610 Last data filed at 01/19/2024 0519 Gross per 24 hour  Intake 502.12 ml  Output 475 ml  Net 27.12 ml   Filed Weights   01/18/24 1224  Weight: (!) 153.3 kg    Exam: General exam: In no acute distress. Respiratory system: Good air movement and clear to auscultation. Cardiovascular system: S1 & S2 heard, RRR. No JVD. Gastrointestinal system: Abdomen is nondistended, soft and nontender.  Extremities: No pedal edema. Skin: No rashes, lesions or ulcers Psychiatry: Judgement and insight appear normal. Mood & affect appropriate.    Data Reviewed:    Labs: Basic Metabolic Panel: Recent Labs  Lab 01/18/24 1425  NA 134*  K 4.1  CL 96*  CO2 25  GLUCOSE 202*  BUN 16  CREATININE 1.13  CALCIUM 8.8*   GFR Estimated Creatinine Clearance: 106 mL/min (by C-G formula based on SCr of 1.13 mg/dL). Liver Function Tests: No results for input(s): AST, ALT, ALKPHOS, BILITOT, PROT, ALBUMIN  in the last 168 hours. No results for input(s): LIPASE, AMYLASE in the last 168 hours. No results for input(s): AMMONIA in the last 168 hours. Coagulation profile No results for input(s): INR, PROTIME in the last 168 hours. COVID-19 Labs  No results for input(s): DDIMER, FERRITIN, LDH, CRP in the last 72 hours.  Lab Results  Component Value Date   SARSCOV2NAA Not Detected 08/16/2019    CBC: Recent Labs  Lab 01/18/24 1425  WBC 12.1*  NEUTROABS 9.7*  HGB 11.0*  HCT 33.0*  MCV 93.2  PLT 229   Cardiac Enzymes: No results for input(s): CKTOTAL, CKMB, CKMBINDEX,  TROPONINI in the last 168 hours. BNP (last 3 results) No results for input(s): PROBNP in the last 8760 hours. CBG: Recent Labs  Lab 01/18/24 2204  GLUCAP 181*   D-Dimer: No results for input(s): DDIMER in the last 72 hours. Hgb A1c: Recent Labs    01/18/24 1425  HGBA1C 7.5*   Lipid Profile: Recent Labs    01/18/24 2036  CHOL 166  HDL 15*  LDLCALC 127*  TRIG 119  CHOLHDL 11.1   Thyroid  function studies: No results for input(s): TSH, T4TOTAL, T3FREE, THYROIDAB in the last 72 hours.  Invalid input(s): FREET3 Anemia work up: No results for input(s): VITAMINB12, FOLATE, FERRITIN, TIBC, IRON, RETICCTPCT in the last 72 hours. Sepsis Labs: Recent Labs  Lab 01/18/24 1425 01/18/24 1653  WBC 12.1*  --   LATICACIDVEN  --  1.2   Microbiology Recent Results (from the past 240 hours)  Blood culture (routine x 2)     Status: None (Preliminary result)   Collection Time: 01/18/24  4:53 PM   Specimen: BLOOD  Result Value Ref Range Status   Specimen Description BLOOD LEFT ANTECUBITAL  Final   Special Requests   Final    BOTTLES DRAWN AEROBIC AND ANAEROBIC Blood Culture adequate volume Performed at [facility], [address] [place], [place] [address]    Culture PENDING  Incomplete   Report Status PENDING  Incomplete  Blood culture (routine x 2)     Status: None (Preliminary result)   Collection Time: 01/18/24  4:53 PM   Specimen: BLOOD  Result Value Ref Range Status   Specimen Description BLOOD RIGHT ANTECUBITAL  Final   Special Requests   Final    BOTTLES DRAWN AEROBIC AND ANAEROBIC Blood Culture adequate volume Performed at [facility], [address], [place], [place] [address]    Culture PENDING  Incomplete   Report Status PENDING  Incomplete     Medications:    enoxaparin  (LOVENOX ) injection  40 mg Subcutaneous Q24H   insulin  aspart  0-5 Units Subcutaneous QHS   insulin  aspart  0-9 Units Subcutaneous TID WC   Continuous  Infusions:  cefTRIAXone  (ROCEPHIN )  IV 2 g (01/18/24 2014)   vancomycin  1,250 mg (01/19/24 0519)      LOS: 1 day   Erle Odell Castor  [facility]  01/19/2024, 6:10 AM

## 2024-01-19 NOTE — Plan of Care (Signed)
   Problem: Activity: Goal: Risk for activity intolerance will decrease Outcome: Progressing   Problem: Safety: Goal: Ability to remain free from injury will improve Outcome: Progressing   Problem: Skin Integrity: Goal: Risk for impaired skin integrity will decrease Outcome: Progressing

## 2024-01-19 NOTE — H&P (View-Only) (Signed)
 ORTHOPAEDIC CONSULTATION  REQUESTING PHYSICIAN: Odell Celinda Balo, MD  Chief Complaint: Foul-smelling abscess left foot second toe.  HPI: Fred Mcintosh is a 63 y.o. male who presents with type 2 diabetes with abscess ulceration and foul odor left foot second toe.  Patient states he was unaware that he had any trouble with his toe.  Past Medical History:  Diagnosis Date   Acute respiratory failure with hypoxia (HCC) 07/13/2012   Secondary to multi-lobar bilateral pneumonia   Asthma    Community acquired pneumonia 07/13/2012   Bilateral/multi-lobar   History reviewed. No pertinent surgical history. Social History   Socioeconomic History   Marital status: Single    Spouse name: Not on file   Number of children: Not on file   Years of education: Not on file   Highest education level: Not on file  Occupational History   Not on file  Tobacco Use   Smoking status: Never    Passive exposure: Never   Smokeless tobacco: Not on file  Vaping Use   Vaping status: Never Used  Substance and Sexual Activity   Alcohol use: No   Drug use: No   Sexual activity: Not on file  Other Topics Concern   Not on file  Social History Narrative   Not on file   Social Drivers of Health   Financial Resource Strain: Not on file  Food Insecurity: No Food Insecurity (01/18/2024)   Hunger Vital Sign    Worried About Running Out of Food in the Last Year: Never true    Ran Out of Food in the Last Year: Never true  Transportation Needs: No Transportation Needs (01/18/2024)   PRAPARE - Administrator, Civil Service (Medical): No    Lack of Transportation (Non-Medical): No  Physical Activity: Not on file  Stress: Not on file  Social Connections: Moderately Integrated (01/18/2024)   Social Connection and Isolation Panel    Frequency of Communication with Friends and Family: More than three times a week    Frequency of Social Gatherings with Friends and Family: More than three  times a week    Attends Religious Services: More than 4 times per year    Active Member of [employer] or Organizations: Yes    Attends Banker Meetings: Never    Marital Status: Never married   History reviewed. No pertinent family history. - negative except otherwise stated in the family history section No Known Allergies Prior to Admission medications   Not on File   US  ARTERIAL ABI (SCREENING LOWER EXTREMITY) Result Date: 01/18/2024 CLINICAL DATA:  Gangrene. Hypertension. Hyperlipidemia. Diabetes. Discoloration and wound of fractured second toe. EXAM: NONINVASIVE PHYSIOLOGIC VASCULAR STUDY OF BILATERAL LOWER EXTREMITIES TECHNIQUE: Evaluation of both lower extremities were performed at rest, including calculation of ankle-brachial indices with single level pressure measurements and doppler recording. COMPARISON:  None available. FINDINGS: Right ABI:  1.20 Left ABI:  0.98 Right Lower Extremity:  Normal arterial waveforms at the ankle. Left Lower Extremity:  Normal arterial waveforms at the ankle. 0.9-0.99 Borderline PAD IMPRESSION: 1. Normal RIGHT lower extremity ankle brachial indices. 2. Minimally decreased LEFT ABI indicative of borderline peripheral arterial disease. Electronically Signed   By: Aliene Lloyd M.D.   On: 01/18/2024 18:20   MR FOOT LEFT WO CONTRAST Result Date: 01/18/2024 EXAM DESCRIPTION: MR FOOT LEFT WO CONTRAST CLINICAL HISTORY: Soft tissue infection suspected, foot, xray done COMPARISON: None Available. TECHNIQUE: MRI of the foot is performed according to our usual  protocol with multiplanar multi sequence imaging. FINDINGS: There is a soft tissue wound about the tuft of the second digit. Cortical erosion/destruction to the tuft with moderate marrow edema. Mild marrow edema to the middle phalanx which is probably reactive. There is a gas within the plantar soft tissues about the DIP joint consistent with gas forming soft tissue infection. Moderate phlegmonous change. No  organized fluid collection. Moderate degenerative change to the dorsal second tarsometatarsal joint and milder to the third and fourth. Also mild to moderate degenerative change first MTP joint and milder to the interphalangeal joint. Mild to moderate diffuse myositis likely reactive. Moderate dorsal subcutaneous edema suggesting cellulitis. IMPRESSION: Soft tissue wound/ulceration about the tuft of the second digit. Moderate phlegmonous changes as well as gas within the soft tissues. Cortical erosion/destruction to the tuft consistent with osteomyelitis. There is mild marrow edema to the middle phalanx which is probably reactive. Early osteomyelitis should be excluded. Moderate dorsal subcutaneous edema suggesting cellulitis. Degenerative change greatest to the dorsal second tarsometatarsal joint. Electronically signed by: Reyes Frees MD 01/18/2024 06:03 PM EDT RP Workstation: [id number]   DG Facial Bones 1-2 Views Result Date: 01/18/2024 CLINICAL DATA:  Metal screening prior to MRI EXAM: FACIAL BONES - 1-2 VIEW COMPARISON:  None Available. FINDINGS: There is no evidence of metallic foreign body within the orbits. Probable dental filling. No significant bone abnormality identified. IMPRESSION: No evidence of metallic foreign body within the orbits. Electronically Signed   By: Andrea Gasman M.D.   On: 01/18/2024 18:02   DG Toe 2nd Left Result Date: 01/18/2024 CLINICAL DATA:  Injury several days ago, pain and swelling EXAM: LEFT SECOND TOE COMPARISON:  None Available. FINDINGS: Frontal, oblique, and lateral views of the left second toe are obtained. There is extensive soft tissue swelling throughout the second and third digits, as well as throughout the dorsum of the left forefoot. Extensive subcutaneous gas is seen throughout the second digit a extending to the base of the third digit. There are no radiopaque foreign bodies. There is evidence of a minimally displaced fracture involving the distal tuft of  the second distal phalanx. IMPRESSION: 1. Minimally displaced fracture through the distal tuft of the second distal phalanx. 2. Marked soft tissue swelling of the second and third digits, with subcutaneous gas as above. While this could reflect an open fracture and subcutaneous gas related to initial trauma, underlying infection with gas-forming organism is suspected. Electronically Signed   By: Ozell Daring M.D.   On: 01/18/2024 13:51   - pertinent xrays, CT, MRI studies were reviewed and independently interpreted  Positive ROS: All other systems have been reviewed and were otherwise negative with the exception of those mentioned in the HPI and as above.  Physical Exam: General: Alert, no acute distress Psychiatric: Patient is competent for consent with normal mood and affect Lymphatic: No axillary or cervical lymphadenopathy Cardiovascular: No pedal edema Respiratory: No cyanosis, no use of accessory musculature GI: No organomegaly, abdomen is soft and non-tender    Images:  @ENCIMAGES @  Labs:  Lab Results  Component Value Date   HGBA1C 7.5 (H) 01/18/2024   REPTSTATUS PENDING 01/18/2024   REPTSTATUS PENDING 01/18/2024   CULT PENDING 01/18/2024   CULT PENDING 01/18/2024    Lab Results  Component Value Date   ALBUMIN 3.0 (L) 07/14/2012        Latest Ref Rng & Units 01/19/2024    5:45 AM 01/18/2024    2:25 PM 07/20/2012    4:42 AM  CBC  EXTENDED  WBC 4.0 - 10.5 K/uL 10.6  12.1  4.1   RBC 4.22 - 5.81 MIL/uL 3.38  3.54  3.64   Hemoglobin 13.0 - 17.0 g/dL 89.5  88.9  88.8   HCT 39.0 - 52.0 % 31.0  33.0  32.9   Platelets 150 - 400 K/uL 301  229  285   NEUT# 1.7 - 7.7 K/uL  9.7    Lymph# 0.7 - 4.0 K/uL  1.6      Neurologic: Patient does not have protective sensation bilateral lower extremities.   MUSCULOSKELETAL:   Skin: Examination patient has necrotic ulceration with foul-smelling drainage from the left foot second toe.  There is sausage digit swelling.  Patient has a  palpable dorsalis pedis pulse and ankle-brachial indices show normal circulation.  Review of the MRI scan shows abscess and osteomyelitis left foot second toe.  White cell count 10.6 with a hemoglobin of 10.4.  Hemoglobin A1c 7.5.  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  Plan: Plan: Will plan for a left foot second ray amputation.  Risk and benefits were discussed including risk of the wound not healing need for additional surgery.  Patient states he understands wished to proceed at this time.  Thank you for the consult and the opportunity to see Mr. Artez Regis, MD [facility] [phone] 9:03 AM

## 2024-01-20 ENCOUNTER — Inpatient Hospital Stay (HOSPITAL_COMMUNITY): Payer: Self-pay | Admitting: Anesthesiology

## 2024-01-20 ENCOUNTER — Other Ambulatory Visit: Payer: Self-pay

## 2024-01-20 ENCOUNTER — Encounter (HOSPITAL_COMMUNITY): Payer: Self-pay | Admitting: Internal Medicine

## 2024-01-20 ENCOUNTER — Encounter (HOSPITAL_COMMUNITY)
Admission: EM | Disposition: A | Discharge status: Home / Self Care | Payer: Self-pay | Source: Home / Self Care | Attending: Internal Medicine

## 2024-01-20 DIAGNOSIS — E119 Type 2 diabetes mellitus without complications: Secondary | ICD-10-CM | POA: Diagnosis not present

## 2024-01-20 DIAGNOSIS — L089 Local infection of the skin and subcutaneous tissue, unspecified: Secondary | ICD-10-CM | POA: Diagnosis not present

## 2024-01-20 DIAGNOSIS — L02612 Cutaneous abscess of left foot: Secondary | ICD-10-CM

## 2024-01-20 DIAGNOSIS — Z6841 Body Mass Index (BMI) 40.0 and over, adult: Secondary | ICD-10-CM | POA: Diagnosis not present

## 2024-01-20 DIAGNOSIS — E11628 Type 2 diabetes mellitus with other skin complications: Secondary | ICD-10-CM | POA: Diagnosis not present

## 2024-01-20 HISTORY — PX: AMPUTATION TOE: SHX6595

## 2024-01-20 LAB — GLUCOSE, CAPILLARY
Glucose-Capillary: 229 mg/dL — ABNORMAL HIGH (ref 70–99)
Glucose-Capillary: 209 mg/dL — ABNORMAL HIGH (ref 70–99)
Glucose-Capillary: 190 mg/dL — ABNORMAL HIGH (ref 70–99)
Glucose-Capillary: 160 mg/dL — ABNORMAL HIGH (ref 70–99)
Glucose-Capillary: 130 mg/dL — ABNORMAL HIGH (ref 70–99)
Glucose-Capillary: 100 mg/dL — ABNORMAL HIGH (ref 70–99)

## 2024-01-20 SURGERY — AMPUTATION, TOE
Anesthesia: Monitor Anesthesia Care | Site: Toe | Laterality: Left

## 2024-01-20 MED ORDER — ACETAMINOPHEN 325 MG PO TABS: 325.0000 mg | ORAL_TABLET | Freq: Four times a day (QID) | ORAL | Status: DC | PRN

## 2024-01-20 MED ORDER — VASHE WOUND IRRIGATION OPTIME
TOPICAL | Status: DC | PRN
  Administered 2024-01-20: 34 [oz_av]

## 2024-01-20 MED ORDER — 0.9 % SODIUM CHLORIDE (POUR BTL) OPTIME: TOPICAL | Status: DC | PRN

## 2024-01-20 MED ORDER — HYDROMORPHONE HCL 1 MG/ML IJ SOLN: 0.5000 mg | INTRAMUSCULAR | Status: DC | PRN

## 2024-01-20 MED ORDER — LIDOCAINE HCL (PF) 1 % IJ SOLN
INTRAMUSCULAR | Status: AC
  Filled 2024-01-20: qty 30

## 2024-01-20 MED ORDER — VANCOMYCIN HCL 1000 MG IV SOLR
INTRAVENOUS | Status: DC | PRN
Start: 2024-01-20 — End: 2024-01-20
  Administered 2024-01-20: 1000 mg via TOPICAL

## 2024-01-20 MED ORDER — FENTANYL CITRATE (PF) 100 MCG/2ML IJ SOLN
INTRAMUSCULAR | Status: DC | PRN
  Administered 2024-01-20 (×3): 50 ug via INTRAVENOUS

## 2024-01-20 MED ORDER — ACETAMINOPHEN 10 MG/ML IV SOLN: 1000.0000 mg | Freq: Once | INTRAVENOUS | Status: DC | PRN

## 2024-01-20 MED ORDER — FENTANYL CITRATE (PF) 250 MCG/5ML IJ SOLN
INTRAMUSCULAR | Status: AC
  Filled 2024-01-20: qty 5

## 2024-01-20 MED ORDER — VITAMIN C 500 MG PO TABS
1000.0000 mg | ORAL_TABLET | Freq: Every day | ORAL | Status: DC
  Administered 2024-01-20 – 2024-01-23 (×4): 1000 mg via ORAL
  Filled 2024-01-20 (×4): qty 2

## 2024-01-20 MED ORDER — LACTATED RINGERS IV SOLN: INTRAVENOUS | Status: DC | PRN

## 2024-01-20 MED ORDER — VANCOMYCIN HCL 1000 MG IV SOLR
INTRAVENOUS | Status: AC
Start: 2024-01-20 — End: 2024-01-20
  Filled 2024-01-20: qty 20

## 2024-01-20 MED ORDER — ORAL CARE MOUTH RINSE: 15.0000 mL | Freq: Once | OROMUCOSAL | Status: AC

## 2024-01-20 MED ORDER — CHLORHEXIDINE GLUCONATE 0.12 % MT SOLN
15.0000 mL | Freq: Once | OROMUCOSAL | Status: AC
  Administered 2024-01-20: 15 mL via OROMUCOSAL

## 2024-01-20 MED ORDER — ZINC SULFATE 220 (50 ZN) MG PO CAPS
220.0000 mg | ORAL_CAPSULE | Freq: Every day | ORAL | Status: DC
  Administered 2024-01-20 – 2024-01-23 (×4): 220 mg via ORAL
  Filled 2024-01-20 (×4): qty 1

## 2024-01-20 MED ORDER — LIDOCAINE HCL (PF) 1 % IJ SOLN
INTRAMUSCULAR | Status: DC | PRN
  Administered 2024-01-20: 10 mL

## 2024-01-20 MED ORDER — INSULIN ASPART 100 UNIT/ML IJ SOLN: 0.0000 [IU] | INTRAMUSCULAR | Status: DC | PRN

## 2024-01-20 MED ORDER — JUVEN PO PACK
1.0000 | PACK | Freq: Two times a day (BID) | ORAL | Status: DC
  Administered 2024-01-21 – 2024-01-23 (×5): 1 via ORAL
  Filled 2024-01-20 (×5): qty 1

## 2024-01-20 MED ORDER — MIDAZOLAM HCL 2 MG/2ML IJ SOLN
INTRAMUSCULAR | Status: AC
  Filled 2024-01-20: qty 2

## 2024-01-20 MED ORDER — LACTATED RINGERS IV SOLN: INTRAVENOUS | Status: DC

## 2024-01-20 MED ORDER — MIDAZOLAM HCL 2 MG/2ML IJ SOLN
INTRAMUSCULAR | Status: DC | PRN
  Administered 2024-01-20 (×2): 1 mg via INTRAVENOUS

## 2024-01-20 MED ORDER — OXYCODONE HCL 5 MG PO TABS: 5.0000 mg | ORAL_TABLET | Freq: Once | ORAL | Status: DC | PRN

## 2024-01-20 MED ORDER — PROPOFOL 10 MG/ML IV BOLUS
INTRAVENOUS | Status: AC
  Filled 2024-01-20: qty 20

## 2024-01-20 MED ORDER — FENTANYL CITRATE (PF) 100 MCG/2ML IJ SOLN: 25.0000 ug | INTRAMUSCULAR | Status: DC | PRN

## 2024-01-20 MED ORDER — OXYCODONE HCL 5 MG/5ML PO SOLN: 5.0000 mg | Freq: Once | ORAL | Status: DC | PRN

## 2024-01-20 MED ORDER — AMISULPRIDE (ANTIEMETIC) 5 MG/2ML IV SOLN: 10.0000 mg | Freq: Once | INTRAVENOUS | Status: DC | PRN

## 2024-01-20 SURGICAL SUPPLY — 27 items
BAG COUNTER SPONGE SURGICOUNT (BAG) ×1 IMPLANT
BLADE SAW SGTL MED 73X18.5 STR (BLADE) IMPLANT
BLADE SURG 21 STRL SS (BLADE) ×1 IMPLANT
BNDG COHESIVE 4X5 TAN STRL LF (GAUZE/BANDAGES/DRESSINGS) ×1 IMPLANT
BNDG COHESIVE 6X5 TAN ST LF (GAUZE/BANDAGES/DRESSINGS) IMPLANT
BNDG GAUZE DERMACEA FLUFF 4 (GAUZE/BANDAGES/DRESSINGS) ×1 IMPLANT
COVER SURGICAL LIGHT HANDLE (MISCELLANEOUS) ×2 IMPLANT
DRAPE U-SHAPE 47X51 STRL (DRAPES) ×2 IMPLANT
DRSG ADAPTIC 3X8 NADH LF (GAUZE/BANDAGES/DRESSINGS) ×1 IMPLANT
DURAPREP 26ML APPLICATOR (WOUND CARE) ×1 IMPLANT
ELECTRODE REM PT RTRN 9FT ADLT (ELECTROSURGICAL) ×1 IMPLANT
GAUZE PAD ABD 8X10 STRL (GAUZE/BANDAGES/DRESSINGS) ×2 IMPLANT
GAUZE SPONGE 4X4 12PLY STRL (GAUZE/BANDAGES/DRESSINGS) ×1 IMPLANT
GLOVE BIOGEL PI IND STRL 9 (GLOVE) ×1 IMPLANT
GLOVE SURG ORTHO 9.0 STRL STRW (GLOVE) ×1 IMPLANT
GOWN STRL REUS W/ TWL XL LVL3 (GOWN DISPOSABLE) ×2 IMPLANT
GRAFT SKIN WND MICRO 38 (Tissue) IMPLANT
KIT BASIN OR (CUSTOM PROCEDURE TRAY) ×1 IMPLANT
KIT TURNOVER KIT B (KITS) ×1 IMPLANT
NS IRRIG 1000ML POUR BTL (IV SOLUTION) ×1 IMPLANT
PACK ORTHO EXTREMITY (CUSTOM PROCEDURE TRAY) ×1 IMPLANT
PAD ARMBOARD POSITIONER FOAM (MISCELLANEOUS) ×2 IMPLANT
STOCKINETTE IMPERVIOUS LG (DRAPES) IMPLANT
SUT ETHILON 2 0 PSLX (SUTURE) ×1 IMPLANT
TOWEL GREEN STERILE (TOWEL DISPOSABLE) ×1 IMPLANT
TUBE CONNECTING 12X1/4 (SUCTIONS) ×1 IMPLANT
YANKAUER SUCT BULB TIP NO VENT (SUCTIONS) ×1 IMPLANT

## 2024-01-20 NOTE — Anesthesia Postprocedure Evaluation (Signed)
 Anesthesia Post Note  Patient: Fred Mcintosh  Procedure(s) Performed: AMPUTATION, TOE (Left: Toe)     Patient location during evaluation: PACU Anesthesia Type: MAC Level of consciousness: awake Pain management: pain level controlled Vital Signs Assessment: post-procedure vital signs reviewed and stable Respiratory status: spontaneous breathing, nonlabored ventilation and respiratory function stable Cardiovascular status: blood pressure returned to baseline and stable Postop Assessment: no apparent nausea or vomiting Anesthetic complications: no   There were no known notable events for this encounter.  Last Vitals:  Vitals:   01/20/24 1013 01/20/24 1027  BP: 118/75 114/73  Pulse: 82 80  Resp: 19   Temp:  37.2 C  SpO2: 92% 94%    Last Pain:  Vitals:   01/20/24 1027  TempSrc: Oral  PainSc:                  Akio Hudnall P Lindel Marcell

## 2024-01-20 NOTE — Progress Notes (Signed)
 Orthopedic Tech Progress Note Patient Details:  Fred Mcintosh 02-06-1961 [id number]  PO shoe was placed to the LLE. Size large was provided as the extra large would have been too long.   Ortho Devices Type of Ortho Device: Postop shoe/boot Ortho Device/Splint Location: LLE Ortho Device/Splint Interventions: Ordered, Application, Adjustment   Post Interventions Patient Tolerated: Well Instructions Provided: Care of device, Adjustment of device  Mamta Rimmer Ronal Brasil 01/20/2024, 2:12 PM

## 2024-01-20 NOTE — Progress Notes (Signed)
 [facility] PROGRESS NOTE    Progress Note  KENNON ENCINAS  FMW:[id number] DOB: April 11, 1961 DOA: 01/18/2024 PCP: [name], MD     Brief Narrative:   Fred Mcintosh is an 63 y.o. male past medical history of being an unattached patient comes into the ED for left second toe amputation, pain and swelling, in the ED was found to be febrile tachypneic with leukocytosis MRI of the foot showed phlegmon and early osteomyelitis, hyperglycemic at 200   Assessment/Plan:   Sepsis present on admission secondary to cellulitis/diabetic foot early osteomyelitis: Blood cultures were ordered on admission. He was started on IV vancomycin  and Rocephin . MRI confirmed osteomyelitis. ABIs were unremarkable Orthopedic surgery amputation with surgical intervention on 01/20/2024  New onset type 2 diabetes mellitus (HCC) A1c is 7.5 will start him on long-acting insulin  per sliding scale. Will need oral hypoglycemic agents as an outpatient.  Obesity, Class III, BMI 40-49.9 (morbid obesity) Noted has been counseled.  Pseudo hyponatremia Once bili was corrected this will improve.    DVT prophylaxis: lovenox  Family Communication:none Status is: Inpatient Remains inpatient appropriate because: Sepsis due to diabetic foot    Code Status:     Code Status Orders  (From admission, onward)           Start     Ordered   01/18/24 1810  Full code  Continuous       Question:  By:  Answer:  Consent: discussion documented in EHR   01/18/24 1809           Code Status History     Date Active Date Inactive Code Status Order ID Comments User Context   07/13/2012 1914 07/20/2012 1824 Full Code [id number]  Jama Lamar Brought, RN Inpatient         IV Access:   Peripheral IV   Procedures and diagnostic studies:   US  ARTERIAL ABI (SCREENING LOWER EXTREMITY) Result Date: 01/18/2024 CLINICAL DATA:  Gangrene. Hypertension. Hyperlipidemia. Diabetes. Discoloration and wound of  fractured second toe. EXAM: NONINVASIVE PHYSIOLOGIC VASCULAR STUDY OF BILATERAL LOWER EXTREMITIES TECHNIQUE: Evaluation of both lower extremities were performed at rest, including calculation of ankle-brachial indices with single level pressure measurements and doppler recording. COMPARISON:  None available. FINDINGS: Right ABI:  1.20 Left ABI:  0.98 Right Lower Extremity:  Normal arterial waveforms at the ankle. Left Lower Extremity:  Normal arterial waveforms at the ankle. 0.9-0.99 Borderline PAD IMPRESSION: 1. Normal RIGHT lower extremity ankle brachial indices. 2. Minimally decreased LEFT ABI indicative of borderline peripheral arterial disease. Electronically Signed   By: Aliene Lloyd M.D.   On: 01/18/2024 18:20   MR FOOT LEFT WO CONTRAST Result Date: 01/18/2024 EXAM DESCRIPTION: MR FOOT LEFT WO CONTRAST CLINICAL HISTORY: Soft tissue infection suspected, foot, xray done COMPARISON: None Available. TECHNIQUE: MRI of the foot is performed according to our usual protocol with multiplanar multi sequence imaging. FINDINGS: There is a soft tissue wound about the tuft of the second digit. Cortical erosion/destruction to the tuft with moderate marrow edema. Mild marrow edema to the middle phalanx which is probably reactive. There is a gas within the plantar soft tissues about the DIP joint consistent with gas forming soft tissue infection. Moderate phlegmonous change. No organized fluid collection. Moderate degenerative change to the dorsal second tarsometatarsal joint and milder to the third and fourth. Also mild to moderate degenerative change first MTP joint and milder to the interphalangeal joint. Mild to moderate diffuse myositis likely reactive. Moderate dorsal subcutaneous edema suggesting cellulitis. IMPRESSION:  Soft tissue wound/ulceration about the tuft of the second digit. Moderate phlegmonous changes as well as gas within the soft tissues. Cortical erosion/destruction to the tuft consistent with  osteomyelitis. There is mild marrow edema to the middle phalanx which is probably reactive. Early osteomyelitis should be excluded. Moderate dorsal subcutaneous edema suggesting cellulitis. Degenerative change greatest to the dorsal second tarsometatarsal joint. Electronically signed by: Reyes Frees MD 01/18/2024 06:03 PM EDT RP Workstation: [id number]   DG Facial Bones 1-2 Views Result Date: 01/18/2024 CLINICAL DATA:  Metal screening prior to MRI EXAM: FACIAL BONES - 1-2 VIEW COMPARISON:  None Available. FINDINGS: There is no evidence of metallic foreign body within the orbits. Probable dental filling. No significant bone abnormality identified. IMPRESSION: No evidence of metallic foreign body within the orbits. Electronically Signed   By: Andrea Gasman M.D.   On: 01/18/2024 18:02   DG Toe 2nd Left Result Date: 01/18/2024 CLINICAL DATA:  Injury several days ago, pain and swelling EXAM: LEFT SECOND TOE COMPARISON:  None Available. FINDINGS: Frontal, oblique, and lateral views of the left second toe are obtained. There is extensive soft tissue swelling throughout the second and third digits, as well as throughout the dorsum of the left forefoot. Extensive subcutaneous gas is seen throughout the second digit a extending to the base of the third digit. There are no radiopaque foreign bodies. There is evidence of a minimally displaced fracture involving the distal tuft of the second distal phalanx. IMPRESSION: 1. Minimally displaced fracture through the distal tuft of the second distal phalanx. 2. Marked soft tissue swelling of the second and third digits, with subcutaneous gas as above. While this could reflect an open fracture and subcutaneous gas related to initial trauma, underlying infection with gas-forming organism is suspected. Electronically Signed   By: Ozell Daring M.D.   On: 01/18/2024 13:51     Medical Consultants:   None.   Subjective:    RITCHIE KLEE relates no pain does not  remember when was his last bowel movement.  Objective:    Vitals:   01/19/24 0901 01/19/24 1447 01/19/24 2218 01/20/24 0814  BP: 128/77 114/68 125/72 125/79  Pulse: 84 94 89 79  Resp: 16 16 20 19   Temp: 98.3 F (36.8 C) 99.9 F (37.7 C) 99.5 F (37.5 C) 98.9 F (37.2 C)  TempSrc:   Oral Oral  SpO2: 98% 96% 100%   Weight:      Height:       SpO2: 100 %   Intake/Output Summary (Last 24 hours) at 01/20/2024 0900 Last data filed at 01/20/2024 0649 Gross per 24 hour  Intake 589.99 ml  Output 375 ml  Net 214.99 ml   Filed Weights   01/18/24 1224  Weight: (!) 153.3 kg    Exam: General exam: In no acute distress. Respiratory system: Good air movement and clear to auscultation. Cardiovascular system: S1 & S2 heard, RRR. No JVD. Gastrointestinal system: Abdomen is nondistended, soft and nontender.  Extremities: No pedal edema. Skin: No rashes, lesions or ulcers Psychiatry: Judgement and insight appear normal. Mood & affect appropriate.    Data Reviewed:    Labs: Basic Metabolic Panel: Recent Labs  Lab 01/18/24 1425 01/19/24 0545  NA 134* 134*  K 4.1 3.7  CL 96* 98  CO2 25 26  GLUCOSE 202* 171*  BUN 16 13  CREATININE 1.13 0.99  CALCIUM 8.8* 8.7*   GFR Estimated Creatinine Clearance: 121 mL/min (by C-G formula based on SCr of 0.99 mg/dL). Liver  Function Tests: No results for input(s): AST, ALT, ALKPHOS, BILITOT, PROT, ALBUMIN in the last 168 hours. No results for input(s): LIPASE, AMYLASE in the last 168 hours. No results for input(s): AMMONIA in the last 168 hours. Coagulation profile No results for input(s): INR, PROTIME in the last 168 hours. COVID-19 Labs  No results for input(s): DDIMER, FERRITIN, LDH, CRP in the last 72 hours.  Lab Results  Component Value Date   SARSCOV2NAA Not Detected 08/16/2019    CBC: Recent Labs  Lab 01/18/24 1425 01/19/24 0545  WBC 12.1* 10.6*  NEUTROABS 9.7*  --   HGB 11.0* 10.4*  HCT  33.0* 31.0*  MCV 93.2 91.7  PLT 229 301   Cardiac Enzymes: No results for input(s): CKTOTAL, CKMB, CKMBINDEX, TROPONINI in the last 168 hours. BNP (last 3 results) No results for input(s): PROBNP in the last 8760 hours. CBG: Recent Labs  Lab 01/19/24 0609 01/19/24 1128 01/19/24 1654 01/19/24 2134 01/20/24 0620  GLUCAP 151* 144* 162* 177* 229*   D-Dimer: No results for input(s): DDIMER in the last 72 hours. Hgb A1c: Recent Labs    01/18/24 1425  HGBA1C 7.5*   Lipid Profile: Recent Labs    01/18/24 2036  CHOL 166  HDL 15*  LDLCALC 127*  TRIG 119  CHOLHDL 11.1   Thyroid  function studies: No results for input(s): TSH, T4TOTAL, T3FREE, THYROIDAB in the last 72 hours.  Invalid input(s): FREET3 Anemia work up: No results for input(s): VITAMINB12, FOLATE, FERRITIN, TIBC, IRON, RETICCTPCT in the last 72 hours. Sepsis Labs: Recent Labs  Lab 01/18/24 1425 01/18/24 1653 01/19/24 0545  WBC 12.1*  --  10.6*  LATICACIDVEN  --  1.2  --    Microbiology Recent Results (from the past 240 hours)  Blood culture (routine x 2)     Status: None (Preliminary result)   Collection Time: 01/18/24  4:53 PM   Specimen: BLOOD  Result Value Ref Range Status   Specimen Description BLOOD LEFT ANTECUBITAL  Final   Special Requests   Final    BOTTLES DRAWN AEROBIC AND ANAEROBIC Blood Culture adequate volume   Culture   Final    NO GROWTH 2 DAYS Performed at [facility], [address], [place], [place] [address]    Report Status PENDING  Incomplete  Blood culture (routine x 2)     Status: None (Preliminary result)   Collection Time: 01/18/24  4:53 PM   Specimen: BLOOD  Result Value Ref Range Status   Specimen Description BLOOD RIGHT ANTECUBITAL  Final   Special Requests   Final    BOTTLES DRAWN AEROBIC AND ANAEROBIC Blood Culture adequate volume   Culture   Final    NO GROWTH 2 DAYS Performed at [facility], [address], [place], [place]  [address]    Report Status PENDING  Incomplete  MRSA Next Gen by PCR, Nasal     Status: None   Collection Time: 01/19/24 12:55 PM   Specimen: Nasal Mucosa; Nasal Swab  Result Value Ref Range Status   MRSA by PCR Next Gen NOT DETECTED NOT DETECTED Final    Comment: (NOTE) The GeneXpert MRSA Assay (FDA approved for NASAL specimens only), is one component of a comprehensive MRSA colonization surveillance program. It is not intended to diagnose MRSA infection nor to guide or monitor treatment for MRSA infections. Test performance is not FDA approved in patients less than 21 years old. Performed at [facility], [address], [place], [place] [address]  Medications:    [MAR Hold] enoxaparin  (LOVENOX ) injection  75 mg Subcutaneous Q24H   [MAR Hold] insulin  aspart  0-5 Units Subcutaneous QHS   [MAR Hold] insulin  aspart  0-9 Units Subcutaneous TID WC   [MAR Hold] insulin  aspart  4 Units Subcutaneous TID WC   [MAR Hold] insulin  glargine-yfgn  10 Units Subcutaneous Q24H   Continuous Infusions:  acetaminophen      [MAR Hold] cefTRIAXone  (ROCEPHIN )  IV Stopped (01/19/24 2303)   lactated ringers  10 mL/hr at 01/20/24 0815   [MAR Hold] vancomycin  1,250 mg (01/20/24 0631)      LOS: 2 days   Erle Odell Castor  [facility]  01/20/2024, 9:00 AM

## 2024-01-20 NOTE — Anesthesia Preprocedure Evaluation (Addendum)
 Anesthesia Evaluation  Patient identified by MRN, date of birth, ID band Patient awake    Reviewed: Allergy & Precautions, NPO status , Patient's Chart, lab work & pertinent test results  Airway Mallampati: II  TM Distance: >3 FB Neck ROM: Full    Dental  (+) Loose,    Pulmonary asthma , pneumonia   Pulmonary exam normal        Cardiovascular hypertension, Normal cardiovascular exam     Neuro/Psych negative neurological ROS  negative psych ROS   GI/Hepatic negative GI ROS, Neg liver ROS,,,  Endo/Other  diabetes  Class 3 obesity  Renal/GU negative Renal ROS     Musculoskeletal negative musculoskeletal ROS (+)    Abdominal  (+) + obese  Peds  Hematology  (+) Blood dyscrasia, anemia   Anesthesia Other Findings LEFT TOE ABSCESS  Reproductive/Obstetrics                              Anesthesia Physical Anesthesia Plan  ASA: 3  Anesthesia Plan: MAC   Post-op Pain Management:    Induction:   PONV Risk Score and Plan: 1 and Treatment may vary due to age or medical condition  Airway Management Planned: Simple Face Mask  Additional Equipment:   Intra-op Plan:   Post-operative Plan:   Informed Consent: I have reviewed the patients History and Physical, chart, labs and discussed the procedure including the risks, benefits and alternatives for the proposed anesthesia with the patient or authorized representative who has indicated his/her understanding and acceptance.     Dental advisory given  Plan Discussed with: CRNA  Anesthesia Plan Comments:          Anesthesia Quick Evaluation

## 2024-01-20 NOTE — Op Note (Signed)
 01/20/2024  9:56 AM  PATIENT:  Fred Mcintosh    PRE-OPERATIVE DIAGNOSIS:  LEFT TOE ABSCESS  POST-OPERATIVE DIAGNOSIS:  Same  PROCEDURE: Amputation second ray left foot. Application of Kerecis micro graft 38 cm and 1 g vancomycin  powder.  SURGEON:  Jerona LULLA Sage, MD  PHYSICIAN ASSISTANT:None ANESTHESIA:   General  PREOPERATIVE INDICATIONS:  [name] is a  63 y.o. male with a diagnosis of LEFT TOE ABSCESS who failed conservative measures and elected for surgical management.    The risks benefits and alternatives were discussed with the patient preoperatively including but not limited to the risks of infection, bleeding, nerve injury, cardiopulmonary complications, the need for revision surgery, among others, and the patient was willing to proceed.  OPERATIVE IMPLANTS:   Implant Name Type Inv. Item Serial No. Manufacturer Lot No. LRB No. Used Action  GRAFT SKIN WND MICRO 38 - [id number] Tissue GRAFT SKIN WND MICRO 38  [employer] [id number] Left 1 Implanted    @ENCIMAGES @  OPERATIVE FINDINGS: Patient had a large abscess that extended beneath the third metatarsal head.  Necrotic tissue was sent for cultures.  Tissue margins were not clear.  OPERATIVE PROCEDURE: Patient brought the operating room underwent a MAC anesthetic.  After adequate levels anesthesia obtained patient's left lower extremity was prepped using DuraPrep draped into a sterile field a timeout was called.  Patient underwent a local infiltration with 20 cc of 1% lidocaine  plain.  AV incision was made around the second ray.  There was a large area of abscess and necrotic tissue that extended from the second metatarsal head to the third metatarsal head fat pad.  The second ray was amputated through the midshaft of the metatarsal.  Soft tissue was further resected from beneath the third metatarsal head.  The wound was irrigated with Vashe.  Tissue was sent for cultures.  The wound bed was then filled with 38  cm Kerecis micro graft and 1 g of vancomycin  powder.  The incision was closed using 2-0 nylon a sterile dressing was applied patient was taken the PACU in stable condition.   DISCHARGE PLANNING:  Antibiotic duration: Recommend continue antibiotics based on culture sensitivities for 3 weeks.  Weightbearing: Touchdown weightbearing on the left.  Pain medication: Opioid pathway.  Dressing care/ Wound VAC: Dry dressing reinforce as needed  Ambulatory devices: Walker or crutches  Discharge to: Anticipate discharge to home with antibiotic coverage based on culture sensitivities.  Follow-up: In the office 1 week post operative.

## 2024-01-20 NOTE — Transfer of Care (Signed)
 Immediate Anesthesia Transfer of Care Note  Patient: Fred Mcintosh  Procedure(s) Performed: AMPUTATION, TOE (Left: Toe)  Patient Location: PACU  Anesthesia Type:MAC  Level of Consciousness: awake, alert, and oriented.   Airway & Oxygen Therapy: Patient Spontanous Breathing  Post-op Assessment: Report given to RN and Post -op Vital signs reviewed and stable  Post vital signs: Reviewed and stable  Last Vitals:  Vitals Value Taken Time  BP 116/75 01/20/24 09:54  Temp 98 01/20/24 0955  Pulse 85 01/20/24 09:56  Resp 13 01/20/24 09:56  SpO2 92 % 01/20/24 09:56  Vitals shown include unfiled device data.  Pt denies pain  Complications: No notable events documented.

## 2024-01-20 NOTE — Interval H&P Note (Signed)
 History and Physical Interval Note:  01/20/2024 7:42 AM  Fred Mcintosh  has presented today for surgery, with the diagnosis of LEFT TOE ABSCESS.  The various methods of treatment have been discussed with the patient and family. After consideration of risks, benefits and other options for treatment, the patient has consented to  Procedure(s) with comments: AMPUTATION, TOE (Left) - LEFT FOOT 2ND RAY as a surgical intervention.  The patient's history has been reviewed, patient examined, no change in status, stable for surgery.  I have reviewed the patient's chart and labs.  Questions were answered to the patient's satisfaction.     Parissa Chiao V Jie Stickels

## 2024-01-21 DIAGNOSIS — E11628 Type 2 diabetes mellitus with other skin complications: Secondary | ICD-10-CM | POA: Diagnosis not present

## 2024-01-21 DIAGNOSIS — L089 Local infection of the skin and subcutaneous tissue, unspecified: Secondary | ICD-10-CM | POA: Diagnosis not present

## 2024-01-21 DIAGNOSIS — E119 Type 2 diabetes mellitus without complications: Secondary | ICD-10-CM | POA: Diagnosis not present

## 2024-01-21 LAB — GLUCOSE, CAPILLARY
Glucose-Capillary: 95 mg/dL (ref 70–99)
Glucose-Capillary: 104 mg/dL — ABNORMAL HIGH (ref 70–99)
Glucose-Capillary: 128 mg/dL — ABNORMAL HIGH (ref 70–99)
Glucose-Capillary: 177 mg/dL — ABNORMAL HIGH (ref 70–99)

## 2024-01-21 MED ORDER — VANCOMYCIN HCL 1250 MG/250ML IV SOLN
1250.0000 mg | Freq: Three times a day (TID) | INTRAVENOUS | Status: DC
  Administered 2024-01-21 – 2024-01-22 (×3): 1250 mg via INTRAVENOUS
  Filled 2024-01-21 (×4): qty 250

## 2024-01-21 MED ORDER — POLYETHYLENE GLYCOL 3350 17 G PO PACK
17.0000 g | PACK | Freq: Two times a day (BID) | ORAL | Status: AC
  Administered 2024-01-21 – 2024-01-22 (×3): 17 g via ORAL
  Filled 2024-01-21 (×3): qty 1

## 2024-01-21 NOTE — Plan of Care (Signed)

## 2024-01-21 NOTE — Progress Notes (Signed)
 Subjective: 1 Day Post-Op Procedure(s) (LRB): AMPUTATION, TOE (Left) Patient reports pain as mild.    Objective: Vital signs in last 24 hours: Temp:  [97.6 F (36.4 C)-99.3 F (37.4 C)] 98.7 F (37.1 C) (07/06 0727) Pulse Rate:  [80-90] 83 (07/06 0727) Resp:  [18] 18 (07/06 0727) BP: (114-125)/(66-78) 125/76 (07/06 0727) SpO2:  [94 %-98 %] 96 % (07/06 0727)  Intake/Output from previous day: 07/05 0701 - 07/06 0700 In: 680.8 [I.V.:680.8] Out: 550 [Urine:500; Blood:50] Intake/Output this shift: No intake/output data recorded.  Recent Labs    01/18/24 1425 01/19/24 0545  HGB 11.0* 10.4*   Recent Labs    01/18/24 1425 01/19/24 0545  WBC 12.1* 10.6*  RBC 3.54* 3.38*  HCT 33.0* 31.0*  PLT 229 301   Recent Labs    01/18/24 1425 01/19/24 0545  NA 134* 134*  K 4.1 3.7  CL 96* 98  CO2 25 26  BUN 16 13  CREATININE 1.13 0.99  GLUCOSE 202* 171*  CALCIUM 8.8* 8.7*   No results for input(s): LABPT, INR in the last 72 hours.  Incision: dressing C/D/I   Assessment/Plan: 1 Day Post-Op Procedure(s) (LRB): AMPUTATION, TOE (Left) PLAN TDWB LLE- in darco shoe Dry dressing reinforcement as needed Cx growing gram positive cocci and gram negative rods Continue abx      Ronal LITTIE Grave 01/21/2024, 10:24 AM

## 2024-01-21 NOTE — Progress Notes (Signed)
 [facility] PROGRESS NOTE    Progress Note  Fred Mcintosh  FMW:[id number] DOB: [birth year] DOA: 01/18/2024 PCP: [name], MD     Brief Narrative:   Fred Mcintosh is an 64 y.o. male past medical history of being an unattached patient comes into the ED for left second toe amputation, pain and swelling, in the ED was found to be febrile tachypneic with leukocytosis MRI of the foot showed phlegmon and early osteomyelitis, hyperglycemic at 200   Assessment/Plan:   Sepsis present on admission secondary to cellulitis/diabetic foot early osteomyelitis: Blood cultures were ordered on admission. Continue IV vancomycin  and Rocephin . MRI confirmed osteomyelitis. Orthopedic surgery status post surgical intervention 12/05/2023. Antibiotics per orthopedic surgery.  New onset type 2 diabetes mellitus (HCC) A1c is 7.5 will start him on long-acting insulin  per sliding scale. Will need oral hypoglycemic agents as an outpatient.  Obesity, Class III, BMI 40-49.9 (morbid obesity) Noted has been counseled.  Pseudo hyponatremia Once bili was corrected this will improve.    DVT prophylaxis: lovenox  Family Communication:none Status is: Inpatient Remains inpatient appropriate because: Sepsis due to diabetic foot    Code Status:     Code Status Orders  (From admission, onward)           Start     Ordered   01/18/24 1810  Full code  Continuous       Question:  By:  Answer:  Consent: discussion documented in EHR   01/18/24 1809           Code Status History     Date Active Date Inactive Code Status Order ID Comments User Context   07/13/2012 1914 07/20/2012 1824 Full Code [id number]  Jama Lamar Brought, RN Inpatient         IV Access:   Peripheral IV   Procedures and diagnostic studies:   No results found.    Medical Consultants:   None.   Subjective:    Fred Mcintosh relates his pain is controlled has not had a bowel  movement.  Objective:    Vitals:   01/20/24 1947 01/21/24 0056 01/21/24 0457 01/21/24 0727  BP: 114/66 114/78 122/72 125/76  Pulse: 87 90 87 83  Resp: 18 18 18 18   Temp: 97.6 F (36.4 C) 99.3 F (37.4 C) 98.8 F (37.1 C) 98.7 F (37.1 C)  TempSrc:      SpO2: 98% 97% 96% 96%  Weight:      Height:       SpO2: 96 %   Intake/Output Summary (Last 24 hours) at 01/21/2024 0743 Last data filed at 01/21/2024 0138 Gross per 24 hour  Intake 680.83 ml  Output 550 ml  Net 130.83 ml   Filed Weights   01/18/24 1224  Weight: (!) 153.3 kg    Exam: General exam: In no acute distress. Respiratory system: Good air movement and clear to auscultation. Cardiovascular system: S1 & S2 heard, RRR. No JVD. Gastrointestinal system: Abdomen is nondistended, soft and nontender.  Extremities: No pedal edema. Skin: No rashes, lesions or ulcers Psychiatry: Judgement and insight appear normal. Mood & affect appropriate.  Data Reviewed:    Labs: Basic Metabolic Panel: Recent Labs  Lab 01/18/24 1425 01/19/24 0545  NA 134* 134*  K 4.1 3.7  CL 96* 98  CO2 25 26  GLUCOSE 202* 171*  BUN 16 13  CREATININE 1.13 0.99  CALCIUM 8.8* 8.7*   GFR Estimated Creatinine Clearance: 121 mL/min (by C-G formula based on SCr of 0.99  mg/dL). Liver Function Tests: No results for input(s): AST, ALT, ALKPHOS, BILITOT, PROT, ALBUMIN in the last 168 hours. No results for input(s): LIPASE, AMYLASE in the last 168 hours. No results for input(s): AMMONIA in the last 168 hours. Coagulation profile No results for input(s): INR, PROTIME in the last 168 hours. COVID-19 Labs  No results for input(s): DDIMER, FERRITIN, LDH, CRP in the last 72 hours.  Lab Results  Component Value Date   SARSCOV2NAA Not Detected 08/16/2019    CBC: Recent Labs  Lab 01/18/24 1425 01/19/24 0545  WBC 12.1* 10.6*  NEUTROABS 9.7*  --   HGB 11.0* 10.4*  HCT 33.0* 31.0*  MCV 93.2 91.7  PLT 229 301    Cardiac Enzymes: No results for input(s): CKTOTAL, CKMB, CKMBINDEX, TROPONINI in the last 168 hours. BNP (last 3 results) No results for input(s): PROBNP in the last 8760 hours. CBG: Recent Labs  Lab 01/20/24 1003 01/20/24 1242 01/20/24 1628 01/20/24 2123 01/21/24 0640  GLUCAP 190* 160* 130* 100* 95   D-Dimer: No results for input(s): DDIMER in the last 72 hours. Hgb A1c: Recent Labs    01/18/24 1425  HGBA1C 7.5*   Lipid Profile: Recent Labs    01/18/24 2036  CHOL 166  HDL 15*  LDLCALC 127*  TRIG 119  CHOLHDL 11.1   Thyroid  function studies: No results for input(s): TSH, T4TOTAL, T3FREE, THYROIDAB in the last 72 hours.  Invalid input(s): FREET3 Anemia work up: No results for input(s): VITAMINB12, FOLATE, FERRITIN, TIBC, IRON, RETICCTPCT in the last 72 hours. Sepsis Labs: Recent Labs  Lab 01/18/24 1425 01/18/24 1653 01/19/24 0545  WBC 12.1*  --  10.6*  LATICACIDVEN  --  1.2  --    Microbiology Recent Results (from the past 240 hours)  Blood culture (routine x 2)     Status: None (Preliminary result)   Collection Time: 01/18/24  4:53 PM   Specimen: BLOOD  Result Value Ref Range Status   Specimen Description BLOOD LEFT ANTECUBITAL  Final   Special Requests   Final    BOTTLES DRAWN AEROBIC AND ANAEROBIC Blood Culture adequate volume   Culture   Final    NO GROWTH 2 DAYS Performed at [facility], [address], [place], [place] [address]    Report Status PENDING  Incomplete  Blood culture (routine x 2)     Status: None (Preliminary result)   Collection Time: 01/18/24  4:53 PM   Specimen: BLOOD  Result Value Ref Range Status   Specimen Description BLOOD RIGHT ANTECUBITAL  Final   Special Requests   Final    BOTTLES DRAWN AEROBIC AND ANAEROBIC Blood Culture adequate volume   Culture   Final    NO GROWTH 2 DAYS Performed at [facility], [address], [place], [place] [address]    Report Status PENDING  Incomplete   MRSA Next Gen by PCR, Nasal     Status: None   Collection Time: 01/19/24 12:55 PM   Specimen: Nasal Mucosa; Nasal Swab  Result Value Ref Range Status   MRSA by PCR Next Gen NOT DETECTED NOT DETECTED Final    Comment: (NOTE) The GeneXpert MRSA Assay (FDA approved for NASAL specimens only), is one component of a comprehensive MRSA colonization surveillance program. It is not intended to diagnose MRSA infection nor to guide or monitor treatment for MRSA infections. Test performance is not FDA approved in patients less than 40 years old. Performed at [facility], [address] [place], [place]  [address]   Aerobic/Anaerobic Culture w Gram Stain (surgical/deep wound)     Status: None (Preliminary result)   Collection Time: 01/20/24  9:35 AM   Specimen: PATH Traumatic digit amputation; Tissue  Result Value Ref Range Status   Specimen Description TISSUE  Final   Special Requests LEFT TOE ABSCESS  Final   Gram Stain   Final    RARE WBC SEEN FEW GRAM POSITIVE COCCI RARE GRAM NEGATIVE RODS Performed at [facility], [address] [place], [place] [address]    Culture PENDING  Incomplete   Report Status PENDING  Incomplete     Medications:    vitamin C   1,000 mg Oral Daily   enoxaparin  (LOVENOX ) injection  75 mg Subcutaneous Q24H   insulin  aspart  0-5 Units Subcutaneous QHS   insulin  aspart  0-9 Units Subcutaneous TID WC   insulin  aspart  4 Units Subcutaneous TID WC   insulin  glargine-yfgn  10 Units Subcutaneous Q24H   nutrition supplement (JUVEN)  1 packet Oral BID BM   zinc  sulfate (50mg  elemental zinc )  220 mg Oral Daily   Continuous Infusions:  cefTRIAXone  (ROCEPHIN )  IV 2 g (01/20/24 1838)   vancomycin  1,250 mg (01/21/24 0503)      LOS: 3 days   Erle Odell Castor  [facility]  01/21/2024, 7:43 AM

## 2024-01-21 NOTE — Evaluation (Signed)
 Physical Therapy Evaluation Patient Details Name: Fred Mcintosh MRN: [id number] DOB: [birth year] Today's Date: 01/21/2024  History of Present Illness  Fred Mcintosh is a  63 y.o. male admitted 01/18/24 for L toe abscess. Pt presented with sepsis secondary to cellulitis/diabetic foot with early osteomyelitis. Pt is s/p second ray amputation on left foot 7/5. PMHx: T2DM  Clinical Impression  Pt admitted with above diagnosis. PTA, pt was independent with functional mobility, ADLs/IADLs, and worked in the sanitation. He lives with his mom in a one story house with 5 STE and bilateral railings. Pt currently with functional limitations due to the deficits listed below (see PT Problem List). He required CGA-minA for bed mobility, transfer, and gait. Pt is below his baseline function requiring the use of a RW throughout OOB mobility given WBing precautions. He demonstrated limited ability to maintain LLE TTWB despite cues. Pt will benefit from acute skilled PT to increase his independence and safety with mobility to allow discharge. Recommend HHPT to increase strength, decrease pain, improve balance, decrease fall risk, maximize independence with functional mobility, and optimize safety within the home environment.     If plan is discharge home, recommend the following: A little help with walking and/or transfers;A little help with bathing/dressing/bathroom;Assistance with cooking/housework;Help with stairs or ramp for entrance   Can travel by private vehicle        Equipment Recommendations Rolling walker (2 wheels);BSC/3in1  Recommendations for Other Services       Functional Status Assessment Patient has had a recent decline in their functional status and demonstrates the ability to make significant improvements in function in a reasonable and predictable amount of time.     Precautions / Restrictions Precautions Precautions: Fall Recall of Precautions/Restrictions:  Impaired Precaution/Restrictions Comments: Pt needs cues to abide by [facility] precautions. Required Braces or Orthoses: Other Brace Other Brace: Post-Op Shoe Restrictions Weight Bearing Restrictions Per Provider Order: Yes LLE Weight Bearing Per Provider Order: Touchdown weight bearing      Mobility  Bed Mobility Overal bed mobility: Needs Assistance Bed Mobility: Supine to Sit     Supine to sit: HOB elevated, Contact guard     General bed mobility comments: Pt sat up on L side of bed with increased time. Assist to elevate trunk.    Transfers Overall transfer level: Needs assistance Equipment used: Rolling walker (2 wheels) Transfers: Sit to/from Stand, Bed to chair/wheelchair/BSC Sit to Stand: From elevated surface, Min assist   Step pivot transfers: From elevated surface, Contact guard assist, Min assist       General transfer comment: Pt stood from raised bed height. Cued proper hand placement using RW. Powered up with minA. Good eccentric control.    Ambulation/Gait Ambulation/Gait assistance: Contact guard assist, Min assist Gait Distance (Feet): 5 Feet Assistive device: Rolling walker (2 wheels) Gait Pattern/deviations: Step-to pattern, Decreased step length - right, Decreased step length - left, Decreased stride length, Decreased dorsiflexion - right, Shuffle, Wide base of support, Decreased dorsiflexion - left       General Gait Details: Pt transferred to recliner chair on left by taking short small steps. Cued pt to maintain WBing precautions on LLE and increase WBing through BUE support on RW to advance RLE. He lacked foot clearence. Assist to manuever RW and maintain stability.  Stairs            Wheelchair Mobility     Tilt Bed    Modified Rankin (Stroke Patients Only)       Balance  Overall balance assessment: Needs assistance Sitting-balance support: No upper extremity supported, Feet supported Sitting balance-Leahy Scale: Good      Standing balance support: Bilateral upper extremity supported, During functional activity, Reliant on assistive device for balance Standing balance-Leahy Scale: Poor Standing balance comment: Pt dependent on RW                             Pertinent Vitals/Pain Pain Assessment Pain Assessment: Faces Faces Pain Scale: Hurts little more Pain Location: L foot Pain Descriptors / Indicators: Discomfort, Aching, Sore, Tender Pain Intervention(s): Monitored during session, Repositioned    Home Living Family/patient expects to be discharged to:: Private residence Living Arrangements: Parent Available Help at Discharge: Family;Available PRN/intermittently (Mom is 59 y.o.; Brother is back in town and could assist) Type of Home: House Home Access: Stairs to enter Entrance Stairs-Rails: Right;Left;Can reach both Secretary/administrator of Steps: 5   Home Layout: One level Home Equipment: Grab bars - tub/shower;Shower seat;Cane - single point      Prior Function Prior Level of Function : Independent/Modified Independent;Working/employed;Driving             Mobility Comments: Indep without AD. Denies falls in the last 74mo. ADLs Comments: Indep with ADLs/IADLs. 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     Extremity/Trunk Assessment   Upper Extremity Assessment Upper Extremity Assessment: Overall WFL for tasks assessed    Lower Extremity Assessment Lower Extremity Assessment: LLE deficits/detail (RLE overall WFL for tasks assessed.) LLE Deficits / Details: Pt demonstrated WFL hip, knee, and ankle ROM. Grossly 3/5 strength. LLE: Unable to fully assess due to pain;Unable to fully assess due to immobilization LLE Sensation: history of peripheral neuropathy LLE Coordination: decreased gross motor    Cervical / Trunk Assessment Cervical / Trunk Assessment: Other exceptions Cervical / Trunk Exceptions: Body Habitus  Communication    Communication Communication: No apparent difficulties    Cognition Arousal: Alert Behavior During Therapy: WFL for tasks assessed/performed   PT - Cognitive impairments: No apparent impairments                       PT - Cognition Comments: Pt A,Ox4 Following commands: Intact       Cueing Cueing Techniques: Verbal cues     General Comments General comments (skin integrity, edema, etc.): Pt with dressing around L foot.    Exercises     Assessment/Plan    PT Assessment Patient needs continued PT services  PT Problem List Decreased strength;Decreased activity tolerance;Decreased balance;Decreased mobility;Decreased knowledge of use of DME;Decreased knowledge of precautions;Pain       PT Treatment Interventions DME instruction;Gait training;Stair training;Functional mobility training;Therapeutic activities;Therapeutic exercise;Balance training;Patient/family education    PT Goals (Current goals can be found in the Care Plan section)  Acute Rehab PT Goals Patient Stated Goal: Return Home PT Goal Formulation: With patient Time For Goal Achievement: 02/04/24 Potential to Achieve Goals: Good    Frequency Min 2X/week     Co-evaluation               AM-PAC PT 6 Clicks Mobility  Outcome Measure Help needed turning from your back to your side while in a flat bed without using bedrails?: A Little Help needed moving from lying on your back to sitting on the side of a flat bed without using bedrails?: A Little Help needed moving to and from a bed to a chair (including a wheelchair)?: A Little  Help needed standing up from a chair using your arms (e.g., wheelchair or bedside chair)?: A Little Help needed to walk in hospital room?: A Little Help needed climbing 3-5 steps with a railing? : A Lot 6 Click Score: 17    End of Session Equipment Utilized During Treatment: Gait belt Activity Tolerance: Patient tolerated treatment well Patient left: in chair;with call  bell/phone within reach;with chair alarm set Nurse Communication: Mobility status PT Visit Diagnosis: Difficulty in walking, not elsewhere classified (R26.2);Other abnormalities of gait and mobility (R26.89);Unsteadiness on feet (R26.81)    Time: 8865-8844 PT Time Calculation (min) (ACUTE ONLY): 21 min   Charges:   PT Evaluation $PT Eval Moderate Complexity: 1 Mod   PT General Charges $$ ACUTE PT VISIT: 1 Visit         Randall SAUNDERS, PT, DPT Acute Rehabilitation Services Office: [phone] Secure Chat Preferred  Delon CHRISTELLA Callander 01/21/2024, 2:53 PM

## 2024-01-21 NOTE — Progress Notes (Signed)
 Pharmacy Antibiotic Note  Fred Mcintosh is a 63 y.o. male admitted on 01/18/2024 with toe osteomyelitis.  Pharmacy has been consulted for vancomycin  dosing.    Plan: Continue ceftriaxone  2 g iv q24h Change Vancomycin  1250 mg IV q8h  Goal AUC 400-550. Expected AUC: 467 SCr used: 0.99 adjBW,   Vd 0.5  Continue to assess renal fxn, cultures, length of therapy, etc    Height: 6' 2 (188 cm) Weight: (!) 153.3 kg (338 lb) IBW/kg (Calculated) : 82.2  Temp (24hrs), Avg:98.7 F (37.1 C), Min:97.6 F (36.4 C), Max:99.3 F (37.4 C)  Recent Labs  Lab 01/18/24 1425 01/18/24 1653 01/19/24 0545  WBC 12.1*  --  10.6*  CREATININE 1.13  --  0.99  LATICACIDVEN  --  1.2  --     Estimated Creatinine Clearance: 121 mL/min (by C-G formula based on SCr of 0.99 mg/dL).    No Known Allergies  Antimicrobials this admission: Vancomycin  7/3 >>   ceftriaxone  7/3 >>     Thank you for allowing pharmacy to be a part of this patient's care.   Benedetta Heath BS, PharmD, BCPS Clinical Pharmacist 01/21/2024 8:21 AM  Contact: [phone] after 3 PM  Be curious, not judgmental... -Davina Sprinkles

## 2024-01-22 ENCOUNTER — Encounter (HOSPITAL_COMMUNITY): Payer: Self-pay | Admitting: Orthopedic Surgery

## 2024-01-22 DIAGNOSIS — L089 Local infection of the skin and subcutaneous tissue, unspecified: Secondary | ICD-10-CM | POA: Diagnosis not present

## 2024-01-22 DIAGNOSIS — E11628 Type 2 diabetes mellitus with other skin complications: Secondary | ICD-10-CM | POA: Diagnosis not present

## 2024-01-22 DIAGNOSIS — E119 Type 2 diabetes mellitus without complications: Secondary | ICD-10-CM | POA: Diagnosis not present

## 2024-01-22 LAB — GLUCOSE, CAPILLARY
Glucose-Capillary: 159 mg/dL — ABNORMAL HIGH (ref 70–99)
Glucose-Capillary: 161 mg/dL — ABNORMAL HIGH (ref 70–99)
Glucose-Capillary: 154 mg/dL — ABNORMAL HIGH (ref 70–99)
Glucose-Capillary: 173 mg/dL — ABNORMAL HIGH (ref 70–99)

## 2024-01-22 LAB — SURGICAL PCR SCREEN
MRSA, PCR: NEGATIVE
Staphylococcus aureus: NEGATIVE

## 2024-01-22 MED ORDER — AMOXICILLIN-POT CLAVULANATE 875-125 MG PO TABS
1.0000 | ORAL_TABLET | Freq: Two times a day (BID) | ORAL | Status: DC
  Administered 2024-01-22 – 2024-01-23 (×3): 1 via ORAL
  Filled 2024-01-22 (×3): qty 1

## 2024-01-22 MED ORDER — METFORMIN HCL 500 MG PO TABS
500.0000 mg | ORAL_TABLET | Freq: Two times a day (BID) | ORAL | Status: DC
  Administered 2024-01-22 – 2024-01-23 (×3): 500 mg via ORAL
  Filled 2024-01-22 (×4): qty 1

## 2024-01-22 MED ORDER — DOXYCYCLINE HYCLATE 100 MG PO TABS
100.0000 mg | ORAL_TABLET | Freq: Two times a day (BID) | ORAL | Status: DC
  Administered 2024-01-22 – 2024-01-23 (×3): 100 mg via ORAL
  Filled 2024-01-22 (×3): qty 1

## 2024-01-22 NOTE — Progress Notes (Signed)
 Patient ID: Fred Mcintosh, male   DOB: August 25, 1960, 63 y.o.   MRN: [id number] Patient is status post second ray amputation.  Tissue margins were not clear.  Cultures are showing gram-positive cocci and gram-negative rods.  There is Kerecis micro powder and 1 g vancomycin  powder within the wound.  Plan for discharge on antibiotics for 2 to 3 weeks to cover the soft tissue infection.

## 2024-01-22 NOTE — Progress Notes (Signed)
 Mobility Specialist Progress Note:    01/22/24 1105  Mobility  Activity Refused mobility   Pt refused mobility d/t pt stating, he will be leaving in a couple days. Despite encouragement, pt still refused OOB activity. Will f/u as able.    Lavanda Pollack Mobility Specialist  Please contact via [employer] or  Rehab Office [phone]

## 2024-01-22 NOTE — Progress Notes (Signed)
 [facility] PROGRESS NOTE    Progress Note  Fred Mcintosh  FMW:[id number] DOB: 02-04-1961 DOA: 01/18/2024 PCP: [name], MD     Brief Narrative:   Fred Mcintosh is an 63 y.o. male past medical history of being an unattached patient comes into the ED for left second toe amputation, pain and swelling, in the ED was found to be febrile tachypneic with leukocytosis MRI of the foot showed phlegmon and early osteomyelitis, hyperglycemic at 200   Assessment/Plan:   Sepsis present on admission secondary to cellulitis/diabetic foot early osteomyelitis: MRI confirmed osteomyelitis. Orthopedic surgery status post surgical intervention 12/05/2023, Status post second ray amputation Blood cultures negative to date. Wound culture grew gram-positive and gram-negative's. Orthopedic surgery recommended 2 weeks of oral antibiotics as an outpatient.  New onset type 2 diabetes mellitus (HCC) A1c is 7.5 will start him on long-acting insulin  per sliding scale. Start him on oral metformin .  Obesity, Class III, BMI 40-49.9 (morbid obesity) Noted has been counseled.  Pseudo hyponatremia Once bili was corrected this will improve.    DVT prophylaxis: lovenox  Family Communication:none Status is: Inpatient Remains inpatient appropriate because: Sepsis due to diabetic foot    Code Status:     Code Status Orders  (From admission, onward)           Start     Ordered   01/18/24 1810  Full code  Continuous       Question:  By:  Answer:  Consent: discussion documented in EHR   01/18/24 1809           Code Status History     Date Active Date Inactive Code Status Order ID Comments User Context   07/13/2012 1914 07/20/2012 1824 Full Code [id number]  Jama Lamar Brought, RN Inpatient         IV Access:   Peripheral IV   Procedures and diagnostic studies:   No results found.    Medical Consultants:   None.   Subjective:    MAXXWELL EDGETT relates his  pain is controlled  Objective:    Vitals:   01/21/24 1639 01/21/24 1942 01/22/24 0450 01/22/24 0752  BP: 114/71 119/62 127/73 107/81  Pulse: 93 93 79 75  Resp: 18 18 18    Temp: 98.2 F (36.8 C) 98.5 F (36.9 C) 98.6 F (37 C) 98.1 F (36.7 C)  TempSrc: Oral   Oral  SpO2: 98% 98% 99% 97%  Weight:      Height:       SpO2: 97 %   Intake/Output Summary (Last 24 hours) at 01/22/2024 0918 Last data filed at 01/22/2024 9370 Gross per 24 hour  Intake 1065.76 ml  Output 1700 ml  Net -634.24 ml   Filed Weights   01/18/24 1224  Weight: (!) 153.3 kg    Exam: General exam: In no acute distress. Respiratory system: Good air movement and clear to auscultation. Cardiovascular system: S1 & S2 heard, RRR. No JVD. Gastrointestinal system: Abdomen is nondistended, soft and nontender.  Extremities: No pedal edema. Skin: No rashes, lesions or ulcers Psychiatry: Judgement and insight appear normal. Mood & affect appropriate.  Data Reviewed:    Labs: Basic Metabolic Panel: Recent Labs  Lab 01/18/24 1425 01/19/24 0545  NA 134* 134*  K 4.1 3.7  CL 96* 98  CO2 25 26  GLUCOSE 202* 171*  BUN 16 13  CREATININE 1.13 0.99  CALCIUM 8.8* 8.7*   GFR Estimated Creatinine Clearance: 121 mL/min (by C-G formula based on  SCr of 0.99 mg/dL). Liver Function Tests: No results for input(s): AST, ALT, ALKPHOS, BILITOT, PROT, ALBUMIN in the last 168 hours. No results for input(s): LIPASE, AMYLASE in the last 168 hours. No results for input(s): AMMONIA in the last 168 hours. Coagulation profile No results for input(s): INR, PROTIME in the last 168 hours. COVID-19 Labs  No results for input(s): DDIMER, FERRITIN, LDH, CRP in the last 72 hours.  Lab Results  Component Value Date   SARSCOV2NAA Not Detected 08/16/2019    CBC: Recent Labs  Lab 01/18/24 1425 01/19/24 0545  WBC 12.1* 10.6*  NEUTROABS 9.7*  --   HGB 11.0* 10.4*  HCT 33.0* 31.0*  MCV 93.2 91.7   PLT 229 301   Cardiac Enzymes: No results for input(s): CKTOTAL, CKMB, CKMBINDEX, TROPONINI in the last 168 hours. BNP (last 3 results) No results for input(s): PROBNP in the last 8760 hours. CBG: Recent Labs  Lab 01/21/24 0640 01/21/24 1143 01/21/24 1640 01/21/24 2136 01/22/24 0603  GLUCAP 95 104* 128* 177* 159*   D-Dimer: No results for input(s): DDIMER in the last 72 hours. Hgb A1c: No results for input(s): HGBA1C in the last 72 hours.  Lipid Profile: No results for input(s): CHOL, HDL, LDLCALC, TRIG, CHOLHDL, LDLDIRECT in the last 72 hours.  Thyroid  function studies: No results for input(s): TSH, T4TOTAL, T3FREE, THYROIDAB in the last 72 hours.  Invalid input(s): FREET3 Anemia work up: No results for input(s): VITAMINB12, FOLATE, FERRITIN, TIBC, IRON, RETICCTPCT in the last 72 hours. Sepsis Labs: Recent Labs  Lab 01/18/24 1425 01/18/24 1653 01/19/24 0545  WBC 12.1*  --  10.6*  LATICACIDVEN  --  1.2  --    Microbiology Recent Results (from the past 240 hours)  Blood culture (routine x 2)     Status: None (Preliminary result)   Collection Time: 01/18/24  4:53 PM   Specimen: BLOOD  Result Value Ref Range Status   Specimen Description BLOOD LEFT ANTECUBITAL  Final   Special Requests   Final    BOTTLES DRAWN AEROBIC AND ANAEROBIC Blood Culture adequate volume   Culture   Final    NO GROWTH 4 DAYS Performed at [facility], [address], [place], [place] [address]    Report Status PENDING  Incomplete  Blood culture (routine x 2)     Status: None (Preliminary result)   Collection Time: 01/18/24  4:53 PM   Specimen: BLOOD  Result Value Ref Range Status   Specimen Description BLOOD RIGHT ANTECUBITAL  Final   Special Requests   Final    BOTTLES DRAWN AEROBIC AND ANAEROBIC Blood Culture adequate volume   Culture   Final    NO GROWTH 4 DAYS Performed at [facility], [address], [place], [place] [address]     Report Status PENDING  Incomplete  MRSA Next Gen by PCR, Nasal     Status: None   Collection Time: 01/19/24 12:55 PM   Specimen: Nasal Mucosa; Nasal Swab  Result Value Ref Range Status   MRSA by PCR Next Gen NOT DETECTED NOT DETECTED Final    Comment: (NOTE) The GeneXpert MRSA Assay (FDA approved for NASAL specimens only), is one component of a comprehensive MRSA colonization surveillance program. It is not intended to diagnose MRSA infection nor to guide or monitor treatment for MRSA infections. Test performance is not FDA approved in patients less than 68 years old. Performed at [facility], [address], [place], [place] [address]   Aerobic/Anaerobic Culture w  Gram Stain (surgical/deep wound)     Status: None (Preliminary result)   Collection Time: 01/20/24  9:35 AM   Specimen: PATH Traumatic digit amputation; Tissue  Result Value Ref Range Status   Specimen Description TISSUE  Final   Special Requests LEFT TOE ABSCESS  Final   Gram Stain   Final    RARE WBC SEEN FEW GRAM POSITIVE COCCI RARE GRAM NEGATIVE RODS    Culture   Final    CULTURE REINCUBATED FOR BETTER GROWTH Performed at [facility], [address], [place], [place] [address]    Report Status PENDING  Incomplete     Medications:    vitamin C   1,000 mg Oral Daily   enoxaparin  (LOVENOX ) injection  75 mg Subcutaneous Q24H   insulin  aspart  0-5 Units Subcutaneous QHS   insulin  aspart  0-9 Units Subcutaneous TID WC   insulin  aspart  4 Units Subcutaneous TID WC   insulin  glargine-yfgn  10 Units Subcutaneous Q24H   nutrition supplement (JUVEN)  1 packet Oral BID BM   polyethylene glycol  17 g Oral BID   zinc  sulfate (50mg  elemental zinc )  220 mg Oral Daily   Continuous Infusions:  cefTRIAXone  (ROCEPHIN )  IV 2 g (01/21/24 1702)   vancomycin  1,250 mg (01/22/24 0516)      LOS: 4 days   Erle Odell Castor  [facility]  01/22/2024, 9:18 AM

## 2024-01-22 NOTE — Plan of Care (Signed)

## 2024-01-22 NOTE — Plan of Care (Signed)

## 2024-01-23 ENCOUNTER — Other Ambulatory Visit (HOSPITAL_COMMUNITY): Payer: Self-pay

## 2024-01-23 DIAGNOSIS — L089 Local infection of the skin and subcutaneous tissue, unspecified: Secondary | ICD-10-CM | POA: Diagnosis not present

## 2024-01-23 LAB — CULTURE, BLOOD (ROUTINE X 2)
Culture: NO GROWTH
Culture: NO GROWTH
Special Requests: ADEQUATE
Special Requests: ADEQUATE

## 2024-01-23 LAB — AEROBIC/ANAEROBIC CULTURE W GRAM STAIN (SURGICAL/DEEP WOUND): Culture: NORMAL

## 2024-01-23 LAB — GLUCOSE, CAPILLARY: Glucose-Capillary: 193 mg/dL — ABNORMAL HIGH (ref 70–99)

## 2024-01-23 MED ORDER — METFORMIN HCL 500 MG PO TABS
1000.0000 mg | ORAL_TABLET | Freq: Two times a day (BID) | ORAL | 2 refills | Status: AC
  Filled 2024-01-23: qty 120, 30d supply, fill #0

## 2024-01-23 MED ORDER — AMOXICILLIN-POT CLAVULANATE 875-125 MG PO TABS
1.0000 | ORAL_TABLET | Freq: Two times a day (BID) | ORAL | 0 refills | Status: AC
  Filled 2024-01-23: qty 28, 14d supply, fill #0

## 2024-01-23 MED ORDER — DOXYCYCLINE HYCLATE 100 MG PO TABS
100.0000 mg | ORAL_TABLET | Freq: Two times a day (BID) | ORAL | 0 refills | Status: AC
  Filled 2024-01-23: qty 28, 14d supply, fill #0

## 2024-01-23 MED ORDER — ACETAMINOPHEN 325 MG PO TABS: 650.0000 mg | ORAL_TABLET | Freq: Four times a day (QID) | ORAL | Status: AC | PRN

## 2024-01-23 NOTE — Plan of Care (Signed)
   Problem: Acute Rehab PT Goals(only PT should resolve) Goal: Pt Will Go Supine/Side To Sit Outcome: Adequate for Discharge Goal: Pt Will Go Sit To Supine/Side Outcome: Adequate for Discharge Goal: Patient Will Transfer Sit To/From Stand Outcome: Adequate for Discharge Goal: Pt Will Ambulate Outcome: Adequate for Discharge Goal: Pt Will Go Up/Down Stairs Outcome: Adequate for Discharge

## 2024-01-23 NOTE — Progress Notes (Signed)
 Explained discharge instructions to patient. Reviewed follow up appointment and next medication administration times. Also reviewed education. Patient verbalized having an understanding for instructions given. All belongings are in the patient's possession. Transporter will pick up the patient's TOC meds on the way to the discharge lounge to await his ride.  IV and telemetry were removed by his RN. Reinforced diabetic education and medication administration. No other needs verbalized. Will transport downstairs to the discharge lounge

## 2024-01-23 NOTE — Progress Notes (Signed)
    Durable Medical Equipment  (From admission, onward)           Start     Ordered   01/23/24 0801  For home use only DME Walker rolling  Once       Question Answer Comment  Walker: With 5 Inch Wheels   Patient needs a walker to treat with the following condition Gait instability      01/23/24 0801   01/23/24 0801  For home use only DME Bedside commode  Once       Comments: Confine to one room  Question:  Patient needs a bedside commode to treat with the following condition  Answer:  Gait instability   01/23/24 0801

## 2024-01-23 NOTE — Discharge Summary (Signed)
 Physician Discharge Summary  Fred Mcintosh FMW:[id number] DOB: 12-Jul-1961 DOA: 01/18/2024  PCP: [name], MD  Admit date: 01/18/2024 Discharge date: 01/23/2024  Admitted From: Home Disposition:  Home  Recommendations for Outpatient Follow-up:  Follow up with PCP in 1-2 weeks Please obtain BMP/CBC in one week Orthopedic surgery to follow ABI as an outpatient.   Home Health:No Equipment/Devices:None  Discharge Condition:Stable CODE STATUS:fULL Diet recommendation: Heart Healthy   Brief/Interim Summary: 63 y.o. male past medical history of being an unattached patient comes into the ED for left second toe amputation, pain and swelling, in the ED was found to be febrile tachypneic with leukocytosis MRI of the foot showed phlegmon and early osteomyelitis, hyperglycemic at 200   Discharge Diagnoses:  Principal Problem:   Toe infection Active Problems:   New onset type 2 diabetes mellitus (HCC)   Essential hypertension   Obesity, Class III, BMI 40-49.9 (morbid obesity)   Hyponatremia   Sepsis (HCC)   Abscess of second toe of left foot   Diabetic infection of left foot (HCC)  Sepsis present on admission secondary to cellulitis/diabetic foot ulcer early osteomyelitis. MRI confirmed. Orthopedic surgery was consulted and he status post second ray amputation on 01/20/2024. Blood cultures remain negative till date. Wound culture grew gram positive and gram-negative's. Orthopedic surgery recommended 2 weeks of oral antibiotics he was transition to Augmentin  and doxycycline  and will follow-up with orthopedic as an outpatient.  New onset diabetes mellitus type 2: With an A1c of 7.5 he required minimal insulin . Will start on oral metformin  which should continue as an outpatient.  Pseudohyponatremia: Likely due to hyperglycemia now improved.  Discharge Instructions  Discharge Instructions     Diet - low sodium heart healthy   Complete by: As directed    Discharge wound care:    Complete by: As directed    Per orthopedic surgery recommendations.   Increase activity slowly   Complete by: As directed       Allergies as of 01/23/2024   No Known Allergies      Medication List     TAKE these medications    acetaminophen  325 MG tablet Commonly known as: TYLENOL  Take 2 tablets (650 mg total) by mouth every 6 (six) hours as needed for mild pain (pain score 1-3) or fever (or Fever >/= 101).   amoxicillin -clavulanate 875-125 MG tablet Commonly known as: AUGMENTIN  Take 1 tablet by mouth every 12 (twelve) hours for 14 days.   doxycycline  100 MG tablet Commonly known as: VIBRA -TABS Take 1 tablet (100 mg total) by mouth every 12 (twelve) hours for 14 days.   metFORMIN  500 MG tablet Commonly known as: GLUCOPHAGE  Take 2 tablets (1,000 mg total) by mouth 2 (two) times daily with a meal.               Durable Medical Equipment  (From admission, onward)           Start     Ordered   01/23/24 0801  For home use only DME Walker rolling  Once       Question Answer Comment  Walker: With 5 Inch Wheels   Patient needs a walker to treat with the following condition Gait instability      01/23/24 0801   01/23/24 0801  For home use only DME Bedside commode  Once       Comments: Confine to one room  Question:  Patient needs a bedside commode to treat with the following condition  Answer:  Gait  instability   01/23/24 0801              Discharge Care Instructions  (From admission, onward)           Start     Ordered   01/23/24 0000  Discharge wound care:       Comments: Per orthopedic surgery recommendations.   01/23/24 9165            Follow-up Information     Harden Jerona GAILS, MD Follow up in 1 week(s).   Specialty: Orthopedic Surgery Contact information: [address] [place]  [place] [address] [phone]         [name], MD Follow up on 01/29/2024.   Specialty: Family Medicine Why: 10:15 am with Dr. Leigh Pass  information: [address] [place] [address] [phone]                No Known Allergies  Consultations: Orthopedic surgery   Procedures/Studies: US  ARTERIAL ABI (SCREENING LOWER EXTREMITY) Result Date: 01/18/2024 CLINICAL DATA:  Gangrene. Hypertension. Hyperlipidemia. Diabetes. Discoloration and wound of fractured second toe. EXAM: NONINVASIVE PHYSIOLOGIC VASCULAR STUDY OF BILATERAL LOWER EXTREMITIES TECHNIQUE: Evaluation of both lower extremities were performed at rest, including calculation of ankle-brachial indices with single level pressure measurements and doppler recording. COMPARISON:  None available. FINDINGS: Right ABI:  1.20 Left ABI:  0.98 Right Lower Extremity:  Normal arterial waveforms at the ankle. Left Lower Extremity:  Normal arterial waveforms at the ankle. 0.9-0.99 Borderline PAD IMPRESSION: 1. Normal RIGHT lower extremity ankle brachial indices. 2. Minimally decreased LEFT ABI indicative of borderline peripheral arterial disease. Electronically Signed   By: Aliene Lloyd M.D.   On: 01/18/2024 18:20   MR FOOT LEFT WO CONTRAST Result Date: 01/18/2024 EXAM DESCRIPTION: MR FOOT LEFT WO CONTRAST CLINICAL HISTORY: Soft tissue infection suspected, foot, xray done COMPARISON: None Available. TECHNIQUE: MRI of the foot is performed according to our usual protocol with multiplanar multi sequence imaging. FINDINGS: There is a soft tissue wound about the tuft of the second digit. Cortical erosion/destruction to the tuft with moderate marrow edema. Mild marrow edema to the middle phalanx which is probably reactive. There is a gas within the plantar soft tissues about the DIP joint consistent with gas forming soft tissue infection. Moderate phlegmonous change. No organized fluid collection. Moderate degenerative change to the dorsal second tarsometatarsal joint and milder to the third and fourth. Also mild to moderate degenerative change first MTP joint and milder to the  interphalangeal joint. Mild to moderate diffuse myositis likely reactive. Moderate dorsal subcutaneous edema suggesting cellulitis. IMPRESSION: Soft tissue wound/ulceration about the tuft of the second digit. Moderate phlegmonous changes as well as gas within the soft tissues. Cortical erosion/destruction to the tuft consistent with osteomyelitis. There is mild marrow edema to the middle phalanx which is probably reactive. Early osteomyelitis should be excluded. Moderate dorsal subcutaneous edema suggesting cellulitis. Degenerative change greatest to the dorsal second tarsometatarsal joint. Electronically signed by: Reyes Frees MD 01/18/2024 06:03 PM EDT RP Workstation: [id number]   DG Facial Bones 1-2 Views Result Date: 01/18/2024 CLINICAL DATA:  Metal screening prior to MRI EXAM: FACIAL BONES - 1-2 VIEW COMPARISON:  None Available. FINDINGS: There is no evidence of metallic foreign body within the orbits. Probable dental filling. No significant bone abnormality identified. IMPRESSION: No evidence of metallic foreign body within the orbits. Electronically Signed   By: Andrea Gasman M.D.   On: 01/18/2024 18:02   DG Toe 2nd Left Result  Date: 01/18/2024 CLINICAL DATA:  Injury several days ago, pain and swelling EXAM: LEFT SECOND TOE COMPARISON:  None Available. FINDINGS: Frontal, oblique, and lateral views of the left second toe are obtained. There is extensive soft tissue swelling throughout the second and third digits, as well as throughout the dorsum of the left forefoot. Extensive subcutaneous gas is seen throughout the second digit a extending to the base of the third digit. There are no radiopaque foreign bodies. There is evidence of a minimally displaced fracture involving the distal tuft of the second distal phalanx. IMPRESSION: 1. Minimally displaced fracture through the distal tuft of the second distal phalanx. 2. Marked soft tissue swelling of the second and third digits, with subcutaneous gas as  above. While this could reflect an open fracture and subcutaneous gas related to initial trauma, underlying infection with gas-forming organism is suspected. Electronically Signed   By: Ozell Daring M.D.   On: 01/18/2024 13:51   (Echo, Carotid, EGD, Colonoscopy, ERCP)    Subjective: No complaints  Discharge Exam: Vitals:   01/22/24 2138 01/23/24 0711  BP: 118/76 117/69  Pulse: 91 75  Resp: 17   Temp: 99 F (37.2 C) 97.6 F (36.4 C)  SpO2: 97% 98%   Vitals:   01/22/24 0752 01/22/24 1624 01/22/24 2138 01/23/24 0711  BP: 107/81 118/71 118/76 117/69  Pulse: 75 88 91 75  Resp:   17   Temp: 98.1 F (36.7 C) 98.5 F (36.9 C) 99 F (37.2 C) 97.6 F (36.4 C)  TempSrc: Oral Oral  Oral  SpO2: 97% 97% 97% 98%  Weight:      Height:        General: Pt is alert, awake, not in acute distress Cardiovascular: RRR, S1/S2 +, no rubs, no gallops Respiratory: CTA bilaterally, no wheezing, no rhonchi Abdominal: Soft, NT, ND, bowel sounds + Extremities: no edema, no cyanosis    The results of significant diagnostics from this hospitalization (including imaging, microbiology, ancillary and laboratory) are listed below for reference.     Microbiology: Recent Results (from the past 240 hours)  Blood culture (routine x 2)     Status: None   Collection Time: 01/18/24  4:53 PM   Specimen: BLOOD  Result Value Ref Range Status   Specimen Description BLOOD LEFT ANTECUBITAL  Final   Special Requests   Final    BOTTLES DRAWN AEROBIC AND ANAEROBIC Blood Culture adequate volume   Culture   Final    NO GROWTH 5 DAYS Performed at [facility], [address], [place], [place] [address]    Report Status 01/23/2024 FINAL  Final  Blood culture (routine x 2)     Status: None   Collection Time: 01/18/24  4:53 PM   Specimen: BLOOD  Result Value Ref Range Status   Specimen Description BLOOD RIGHT ANTECUBITAL  Final   Special Requests   Final    BOTTLES DRAWN AEROBIC AND ANAEROBIC Blood Culture  adequate volume   Culture   Final    NO GROWTH 5 DAYS Performed at [facility], [address] [place], [place] [address]    Report Status 01/23/2024 FINAL  Final  Surgical pcr screen     Status: None   Collection Time: 01/19/24 12:54 PM   Specimen: Nasal Mucosa; Nasal Swab  Result Value Ref Range Status   MRSA, PCR NEGATIVE NEGATIVE Final   Staphylococcus aureus NEGATIVE NEGATIVE Final    Comment: (NOTE) The Xpert SA Assay (FDA approved for NASAL specimens in patients  91 years of age and older), is one component of a comprehensive surveillance program. It is not intended to diagnose infection nor to guide or monitor treatment. Performed at [facility], [address] [place], [place] [address]   MRSA Next Gen by PCR, Nasal     Status: None   Collection Time: 01/19/24 12:55 PM   Specimen: Nasal Mucosa; Nasal Swab  Result Value Ref Range Status   MRSA by PCR Next Gen NOT DETECTED NOT DETECTED Final    Comment: (NOTE) The GeneXpert MRSA Assay (FDA approved for NASAL specimens only), is one component of a comprehensive MRSA colonization surveillance program. It is not intended to diagnose MRSA infection nor to guide or monitor treatment for MRSA infections. Test performance is not FDA approved in patients less than 9 years old. Performed at [facility], [address] [place], [place] [address]   Aerobic/Anaerobic Culture w Gram Stain (surgical/deep wound)     Status: None (Preliminary result)   Collection Time: 01/20/24  9:35 AM   Specimen: PATH Traumatic digit amputation; Tissue  Result Value Ref Range Status   Specimen Description TISSUE  Final   Special Requests LEFT TOE ABSCESS  Final   Gram Stain   Final    RARE WBC SEEN FEW GRAM POSITIVE COCCI RARE GRAM NEGATIVE RODS    Culture   Final    RARE NORMAL SKIN FLORA NO GROUP A STREP (S.PYOGENES) ISOLATED NO STAPHYLOCOCCUS AUREUS ISOLATED HOLDING FOR POSSIBLE ANAEROBE Performed at [facility], [address] [place], [place] [address]    Report Status PENDING  Incomplete     Labs: BNP (last 3 results) No results for input(s): BNP in the last 8760 hours. Basic Metabolic Panel: Recent Labs  Lab 01/18/24 1425 01/19/24 0545  NA 134* 134*  K 4.1 3.7  CL 96* 98  CO2 25 26  GLUCOSE 202* 171*  BUN 16 13  CREATININE 1.13 0.99  CALCIUM 8.8* 8.7*   Liver Function Tests: No results for input(s): AST, ALT, ALKPHOS, BILITOT, PROT, ALBUMIN in the last 168 hours. No results for input(s): LIPASE, AMYLASE in the last 168 hours. No results for input(s): AMMONIA in the last 168 hours. CBC: Recent Labs  Lab 01/18/24 1425 01/19/24 0545  WBC 12.1* 10.6*  NEUTROABS 9.7*  --   HGB 11.0* 10.4*  HCT 33.0* 31.0*  MCV 93.2 91.7  PLT 229 301   Cardiac Enzymes: No results for input(s): CKTOTAL, CKMB, CKMBINDEX, TROPONINI in the last 168 hours. BNP: Invalid input(s): POCBNP CBG: Recent Labs  Lab 01/21/24 2136 01/22/24 0603 01/22/24 1116 01/22/24 1622 01/22/24 2143  GLUCAP 177* 159* 161* 154* 173*   D-Dimer No results for input(s): DDIMER in the last 72 hours. Hgb A1c No results for input(s): HGBA1C in the last 72 hours. Lipid Profile No results for input(s): CHOL, HDL, LDLCALC, TRIG, CHOLHDL, LDLDIRECT in the last 72 hours. Thyroid  function studies No results for input(s): TSH, T4TOTAL, T3FREE, THYROIDAB in the last 72 hours.  Invalid input(s): FREET3 Anemia work up No results for input(s): VITAMINB12, FOLATE, FERRITIN, TIBC, IRON, RETICCTPCT in the last 72 hours. Urinalysis    Component Value Date/Time   COLORURINE YELLOW 09/27/2015 2325   APPEARANCEUR TURBID (A) 09/27/2015 2325   LABSPEC 1.025 09/27/2015 2325   PHURINE 6.0 09/27/2015 2325   GLUCOSEU NEGATIVE 09/27/2015 2325   HGBUR LARGE (A) 09/27/2015 2325   BILIRUBINUR NEGATIVE 09/27/2015 2325   KETONESUR TRACE (A) 09/27/2015 2325  PROTEINUR TRACE (A) 09/27/2015 2325   NITRITE NEGATIVE 09/27/2015 2325   LEUKOCYTESUR NEGATIVE 09/27/2015 2325   Sepsis Labs Recent Labs  Lab 01/18/24 1425 01/19/24 0545  WBC 12.1* 10.6*   Microbiology Recent Results (from the past 240 hours)  Blood culture (routine x 2)     Status: None   Collection Time: 01/18/24  4:53 PM   Specimen: BLOOD  Result Value Ref Range Status   Specimen Description BLOOD LEFT ANTECUBITAL  Final   Special Requests   Final    BOTTLES DRAWN AEROBIC AND ANAEROBIC Blood Culture adequate volume   Culture   Final    NO GROWTH 5 DAYS Performed at [facility], [address] [place], [place] [address]    Report Status 01/23/2024 FINAL  Final  Blood culture (routine x 2)     Status: None   Collection Time: 01/18/24  4:53 PM   Specimen: BLOOD  Result Value Ref Range Status   Specimen Description BLOOD RIGHT ANTECUBITAL  Final   Special Requests   Final    BOTTLES DRAWN AEROBIC AND ANAEROBIC Blood Culture adequate volume   Culture   Final    NO GROWTH 5 DAYS Performed at [facility], [address] [place], [place] [address]    Report Status 01/23/2024 FINAL  Final  Surgical pcr screen     Status: None   Collection Time: 01/19/24 12:54 PM   Specimen: Nasal Mucosa; Nasal Swab  Result Value Ref Range Status   MRSA, PCR NEGATIVE NEGATIVE Final   Staphylococcus aureus NEGATIVE NEGATIVE Final    Comment: (NOTE) The Xpert SA Assay (FDA approved for NASAL specimens in patients 4 years of age and older), is one component of a comprehensive surveillance program. It is not intended to diagnose infection nor to guide or monitor treatment. Performed at [facility], [address] [place], [place] [address]   MRSA Next Gen by PCR, Nasal     Status: None   Collection Time: 01/19/24 12:55 PM   Specimen: Nasal Mucosa; Nasal Swab  Result Value Ref Range Status   MRSA by PCR Next Gen NOT DETECTED NOT DETECTED Final    Comment: (NOTE) The GeneXpert  MRSA Assay (FDA approved for NASAL specimens only), is one component of a comprehensive MRSA colonization surveillance program. It is not intended to diagnose MRSA infection nor to guide or monitor treatment for MRSA infections. Test performance is not FDA approved in patients less than 47 years old. Performed at [facility], [address] [place], [place] [address]   Aerobic/Anaerobic Culture w Gram Stain (surgical/deep wound)     Status: None (Preliminary result)   Collection Time: 01/20/24  9:35 AM   Specimen: PATH Traumatic digit amputation; Tissue  Result Value Ref Range Status   Specimen Description TISSUE  Final   Special Requests LEFT TOE ABSCESS  Final   Gram Stain   Final    RARE WBC SEEN FEW GRAM POSITIVE COCCI RARE GRAM NEGATIVE RODS    Culture   Final    RARE NORMAL SKIN FLORA NO GROUP A STREP (S.PYOGENES) ISOLATED NO STAPHYLOCOCCUS AUREUS ISOLATED HOLDING FOR POSSIBLE ANAEROBE Performed at [facility], [address], [place], [place] [address]    Report Status PENDING  Incomplete     Time coordinating discharge: Over 35 minutes  SIGNED:   Erle Odell Castor, MD  [facility] 01/23/2024, 8:34 AM Pager   If 7PM-7AM, please contact night-coverage [email or web address] Password [email or web address]

## 2024-01-23 NOTE — Progress Notes (Signed)
 Physical Therapy Treatment Patient Details Name: Fred Mcintosh MRN: [id number] DOB: 08-24-1960 Today's Date: 01/23/2024   History of Present Illness Fred Mcintosh is a  63 y.o. male admitted 01/18/24 for L toe abscess. Pt presented with sepsis secondary to cellulitis/diabetic foot with early osteomyelitis. Pt is s/p second ray amputation on left foot 7/5. PMHx: T2DM    PT Comments  Received pt semi-reclined in bed waiting to discharge home. Encouraged OOB mobility in preparation for discharge but pt refused stating when I get out of this bed, I'm leaving this place. Pt with other questions - therapist reviewed how to maintain TDWB precautions and demonstrated ambulation technique, adjusted Darco shoe and educated pt on donning/doffing and importance of wearing it at all times, discussed skin inspection and foot care, and reviewed healthy diet changes. Pt reports plan to go home with RW and with no other questions/concerns regarding discharge home. Recommend follow up HHPT services.    If plan is discharge home, recommend the following: A little help with walking and/or transfers;A little help with bathing/dressing/bathroom;Assistance with cooking/housework;Help with stairs or ramp for entrance   Can travel by private vehicle        Equipment Recommendations  Rolling walker (2 wheels);BSC/3in1    Recommendations for Other Services       Precautions / Restrictions Precautions Precautions: Fall Recall of Precautions/Restrictions: Impaired Precaution/Restrictions Comments: Pt needs cues to abide by [facility] precautions. Required Braces or Orthoses: Other Brace Other Brace: Post-Op Shoe Restrictions Weight Bearing Restrictions Per Provider Order: Yes LLE Weight Bearing Per Provider Order: Touchdown weight bearing     Mobility  Bed Mobility               General bed mobility comments: refused OOB mobility    Transfers                   General transfer comment:  refused OOB mobility    Ambulation/Gait               General Gait Details: refused OOB mobility   Stairs             Wheelchair Mobility     Tilt Bed    Modified Rankin (Stroke Patients Only)       Balance               Standing balance comment: refused OOB mobility                            Communication Communication Communication: No apparent difficulties  Cognition Arousal: Alert Behavior During Therapy: WFL for tasks assessed/performed   PT - Cognitive impairments: No apparent impairments                                Cueing    Exercises      General Comments        Pertinent Vitals/Pain Pain Assessment Pain Assessment: Faces Faces Pain Scale: Hurts a little bit Pain Location: L foot Pain Descriptors / Indicators: Discomfort, Sore, Tender Pain Intervention(s): Monitored during session, Premedicated before session, Limited activity within patient's tolerance    Home Living                          Prior Function            PT Goals (current goals  can now be found in the care plan section) Acute Rehab PT Goals Patient Stated Goal: Return Home PT Goal Formulation: With patient Time For Goal Achievement: 02/04/24 Potential to Achieve Goals: Good    Frequency    Min 2X/week      PT Plan      Co-evaluation              AM-PAC PT 6 Clicks Mobility   Outcome Measure  Help needed turning from your back to your side while in a flat bed without using bedrails?: A Little Help needed moving from lying on your back to sitting on the side of a flat bed without using bedrails?: A Little Help needed moving to and from a bed to a chair (including a wheelchair)?: A Little Help needed standing up from a chair using your arms (e.g., wheelchair or bedside chair)?: A Little Help needed to walk in hospital room?: A Little Help needed climbing 3-5 steps with a railing? : A Lot 6 Click  Score: 17    End of Session     Patient left: in bed;with call bell/phone within reach Nurse Communication: Mobility status PT Visit Diagnosis: Difficulty in walking, not elsewhere classified (R26.2);Other abnormalities of gait and mobility (R26.89);Unsteadiness on feet (R26.81)     Time: 9047-8992 PT Time Calculation (min) (ACUTE ONLY): 15 min  Charges:    $Therapeutic Activity: 8-22 mins PT General Charges $$ ACUTE PT VISIT: 1 Visit                     Therisa Stains PT, DPT Therisa HERO Zaunegger 01/23/2024, 10:41 AM

## 2024-02-06 ENCOUNTER — Telehealth: Payer: Self-pay

## 2024-02-06 ENCOUNTER — Encounter: Payer: Self-pay | Admitting: Physician Assistant

## 2024-02-06 ENCOUNTER — Telehealth: Payer: Self-pay | Admitting: Orthopedic Surgery

## 2024-02-06 ENCOUNTER — Ambulatory Visit (INDEPENDENT_AMBULATORY_CARE_PROVIDER_SITE_OTHER): Admitting: Physician Assistant

## 2024-02-06 DIAGNOSIS — L97529 Non-pressure chronic ulcer of other part of left foot with unspecified severity: Secondary | ICD-10-CM

## 2024-02-06 MED ORDER — NITROGLYCERIN 0.2 MG/HR TD PT24: 0.2000 mg | MEDICATED_PATCH | Freq: Every day | TRANSDERMAL | 12 refills | Status: DC

## 2024-02-06 NOTE — Telephone Encounter (Signed)
 This is a duplicate message. This has already been sent in.

## 2024-02-06 NOTE — Progress Notes (Signed)
 Office Visit Note   Patient: Fred Mcintosh           Date of Birth: Sep 14, 1960           MRN: [id number] Visit Date: 02/06/2024              Requested by: [name], MD [address] [place],  [place] [address] PCP: [name], MD  Chief Complaint  Patient presents with   Left Foot - Routine Post Op    01/20/24 left 2nd ray amputation      HPI: 63 y/o male s/p left second ray amputation.  Newly diagnosed DM.  He states he does drive to breakfast and then rides around some.  He then goes home daily and tries to elevate his foot.  He has been on Doxycycline  and Augmentin  since discharge.  He has also been started on Metformin  for his DM.  He denies pain.  No fever or chills.    Assessment & Plan: Visit Diagnoses:  1. Non-healing ulcer of left foot, unspecified ulcer stage (HCC)     Plan: He will wash with soap and water followed by VASHE wet to dry packing in the second ray amp site place nitroglycerin  patch on various dorsum of the foot area.  Wrap with ace for compression.    Follow-Up Instructions: Return in about 1 week (around 02/13/2024).   Ortho Exam  Patient is alert, oriented, no adenopathy, well-dressed, normal affect, normal respiratory effort. There is drainage from the open second toe with tunneling about 3 cm deep.  The third toe has superficial tissue loss with maceration.  The plantar foot is macerated at the base of the second MTH.      Imaging: No results found. No images are attached to the encounter.  Labs: Lab Results  Component Value Date   HGBA1C 7.5 (H) 01/18/2024   REPTSTATUS 01/23/2024 FINAL 01/20/2024   GRAMSTAIN  01/20/2024    RARE WBC SEEN FEW GRAM POSITIVE COCCI RARE GRAM NEGATIVE RODS    CULT  01/20/2024    RARE NORMAL SKIN FLORA NO GROUP A STREP (S.PYOGENES) ISOLATED NO STAPHYLOCOCCUS AUREUS ISOLATED MODERATE BACTEROIDES INTERMEDIUS BETA LACTAMASE POSITIVE Performed at [facility], [address],  [place], [place] [address]      Lab Results  Component Value Date   ALBUMIN 3.0 (L) 07/14/2012    No results found for: MG No results found for: VD25OH  No results found for: PREALBUMIN    Latest Ref Rng & Units 01/19/2024    5:45 AM 01/18/2024    2:25 PM 07/20/2012    4:42 AM  CBC EXTENDED  WBC 4.0 - 10.5 K/uL 10.6  12.1  4.1   RBC 4.22 - 5.81 MIL/uL 3.38  3.54  3.64   Hemoglobin 13.0 - 17.0 g/dL 89.5  88.9  88.8   HCT 39.0 - 52.0 % 31.0  33.0  32.9   Platelets 150 - 400 K/uL 301  229  285   NEUT# 1.7 - 7.7 K/uL  9.7    Lymph# 0.7 - 4.0 K/uL  1.6       There is no height or weight on file to calculate BMI.  Orders:  No orders of the defined types were placed in this encounter.  Meds ordered this encounter  Medications   nitroGLYCERIN  (NITRODUR - DOSED IN MG/24 HR) 0.2 mg/hr patch    Sig: Place 1 patch (0.2 mg total) onto the skin daily.  Dispense:  30 patch    Refill:  12     Procedures: No procedures performed  Clinical Data: No additional findings.  ROS:  All other systems negative, except as noted in the HPI. Review of Systems  Objective: Vital Signs: There were no vitals taken for this visit.  Specialty Comments:  No specialty comments available.  PMFS History: Patient Active Problem List   Diagnosis Date Noted   New onset type 2 diabetes mellitus (HCC) 01/19/2024   Essential hypertension 01/19/2024   Obesity, Class III, BMI 40-49.9 (morbid obesity) 01/19/2024   Hyponatremia 01/19/2024   Sepsis (HCC) 01/19/2024   Abscess of second toe of left foot 01/19/2024   Diabetic infection of left foot (HCC) 01/19/2024   Toe infection 01/18/2024   Anemia 07/18/2012   Acute respiratory failure with hypoxia (HCC) 07/18/2012   Community acquired pneumonia 07/13/2012   Hypokalemia 07/13/2012   Dehydration 07/13/2012   Thrombocytopenia (HCC) 07/13/2012   Past Medical History:  Diagnosis Date   Acute respiratory failure with hypoxia (HCC) 07/13/2012    Secondary to multi-lobar bilateral pneumonia   Asthma    Community acquired pneumonia 07/13/2012   Bilateral/multi-lobar    History reviewed. No pertinent family history.  Past Surgical History:  Procedure Laterality Date   AMPUTATION TOE Left 01/20/2024   Procedure: AMPUTATION, TOE;  Surgeon: Harden Jerona GAILS, MD;  Location: [facility] OR;  Service: Orthopedics;  Laterality: Left;  LEFT FOOT 2ND RAY   Social History   Occupational History   Not on file  Tobacco Use   Smoking status: Never    Passive exposure: Never   Smokeless tobacco: Not on file  Vaping Use   Vaping status: Never Used  Substance and Sexual Activity   Alcohol use: No   Drug use: No   Sexual activity: Not on file

## 2024-02-06 NOTE — Telephone Encounter (Signed)
 Can you please send in his nitroglycerin  patches?

## 2024-02-06 NOTE — Telephone Encounter (Signed)
 Patient called asking for a prescription for the small patches he has to use on his feet after he washes them.  Stated that when he washes his feet tomorrow he will not have any patches to use.   PATIENT USES [facility]

## 2024-02-06 NOTE — Telephone Encounter (Signed)
 Pt called requesting nitroglycerin  patches for his feet be sent to [facility]. [place]. Pt states he needs them today because he has none for tomorrow. Please call pt when sent. Pt phone number is [phone].

## 2024-02-06 NOTE — Telephone Encounter (Signed)
 Called pt, went straight to VM, lm that rx sent

## 2024-02-13 ENCOUNTER — Ambulatory Visit (INDEPENDENT_AMBULATORY_CARE_PROVIDER_SITE_OTHER): Admitting: Orthopedic Surgery

## 2024-02-13 ENCOUNTER — Telehealth: Payer: Self-pay | Admitting: Orthopedic Surgery

## 2024-02-13 ENCOUNTER — Encounter: Payer: Self-pay | Admitting: Orthopedic Surgery

## 2024-02-13 DIAGNOSIS — L97529 Non-pressure chronic ulcer of other part of left foot with unspecified severity: Secondary | ICD-10-CM

## 2024-02-13 NOTE — Progress Notes (Signed)
 Office Visit Note   Patient: Fred Mcintosh           Date of Birth: [birth year]           MRN: [id number] Visit Date: 02/13/2024              Requested by: [name], MD [address] [place],  [place] [address] PCP: [name], MD  Chief Complaint  Patient presents with   Left Foot - Routine Post Op    01/20/24 left 2nd ray amputation      HPI: Patient is a 63 year old gentleman is 3 weeks status post left foot second ray amputation.  Patient has been full weightbearing.  He has been using a nitroglycerin  patch to help microcirculation.  Assessment & Plan: Visit Diagnoses:  1. Non-healing ulcer of left foot, unspecified ulcer stage (HCC)     Plan: Due to patient's microcirculatory disease recommended proceeding with a transmetatarsal amputation.  Discussed the importance of strict nonweightbearing for the first 2 weeks.  Recommended a kneeling scooter.  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  Follow-Up Instructions: Return in about 2 weeks (around 02/27/2024).   Ortho Exam  Patient is alert, oriented, no adenopathy, well-dressed, normal affect, normal respiratory effort. Examination patient has a good palpable dorsalis pedis pulse.  He has progressive dry gangrenous changes of the third toe with ischemic changes to the great toe and fourth toe.  There is dehiscence of the second ray amputation.  He is using the nitroglycerin  patch without improvement.    Imaging: No results found.    Labs: Lab Results  Component Value Date   HGBA1C 7.5 (H) 01/18/2024   REPTSTATUS 01/23/2024 FINAL 01/20/2024   GRAMSTAIN  01/20/2024    RARE WBC SEEN FEW GRAM POSITIVE COCCI RARE GRAM NEGATIVE RODS    CULT  01/20/2024    RARE NORMAL SKIN FLORA NO GROUP A STREP (S.PYOGENES) ISOLATED NO STAPHYLOCOCCUS AUREUS ISOLATED MODERATE BACTEROIDES INTERMEDIUS BETA LACTAMASE POSITIVE Performed at  [facility], [address], [place], [place] [address]      Lab Results  Component Value Date   ALBUMIN 3.0 (L) 07/14/2012    No results found for: MG No results found for: VD25OH  No results found for: PREALBUMIN    Latest Ref Rng & Units 01/19/2024    5:45 AM 01/18/2024    2:25 PM 07/20/2012    4:42 AM  CBC EXTENDED  WBC 4.0 - 10.5 K/uL 10.6  12.1  4.1   RBC 4.22 - 5.81 MIL/uL 3.38  3.54  3.64   Hemoglobin 13.0 - 17.0 g/dL 89.5  88.9  88.8   HCT 39.0 - 52.0 % 31.0  33.0  32.9   Platelets 150 - 400 K/uL 301  229  285   NEUT# 1.7 - 7.7 K/uL  9.7    Lymph# 0.7 - 4.0 K/uL  1.6       There is no height or weight on file to calculate BMI.  Orders:  No orders of the defined types were placed in this encounter.  No orders of the defined types were placed in this encounter.    Procedures: No procedures performed  Clinical Data: No additional findings.  ROS:  All other systems negative, except as noted in the HPI. Review of Systems  Objective: Vital Signs: There were no vitals taken for this visit.  Specialty Comments:  No specialty comments available.  PMFS History: Patient Active Problem List   Diagnosis Date Noted   New onset type 2 diabetes mellitus (HCC) 01/19/2024   Essential hypertension 01/19/2024   Obesity, Class III, BMI 40-49.9 (morbid obesity) 01/19/2024   Hyponatremia 01/19/2024   Sepsis (HCC) 01/19/2024   Abscess of second toe of left foot 01/19/2024   Diabetic infection of left foot (HCC) 01/19/2024   Toe infection 01/18/2024   Anemia 07/18/2012   Acute respiratory failure with hypoxia (HCC) 07/18/2012   Community acquired pneumonia 07/13/2012   Hypokalemia 07/13/2012   Dehydration 07/13/2012   Thrombocytopenia (HCC) 07/13/2012   Past Medical History:  Diagnosis Date   Acute respiratory failure with hypoxia (HCC) 07/13/2012   Secondary to multi-lobar bilateral pneumonia   Asthma    Community acquired pneumonia 07/13/2012    Bilateral/multi-lobar    History reviewed. No pertinent family history.  Past Surgical History:  Procedure Laterality Date   AMPUTATION TOE Left 01/20/2024   Procedure: AMPUTATION, TOE;  Surgeon: Harden Jerona GAILS, MD;  Location: [facility] OR;  Service: Orthopedics;  Laterality: Left;  LEFT FOOT 2ND RAY   Social History   Occupational History   Not on file  Tobacco Use   Smoking status: Never    Passive exposure: Never   Smokeless tobacco: Not on file  Vaping Use   Vaping status: Never Used  Substance and Sexual Activity   Alcohol use: No   Drug use: No   Sexual activity: Not on file

## 2024-02-13 NOTE — Telephone Encounter (Signed)
 Left message with on patient's voicemail [phone].  Left transmetatarsal amputation is scheduled at [facility] 02-23-24 with Dr. Harden.  Arrival should be 2 hours prior to surgery.  Patient will get a call from hospital preadmission to go over medical history and details.  Post op appointment has been made for 03-01-24 at 10am with Arturo Hans, NP

## 2024-02-21 ENCOUNTER — Encounter (HOSPITAL_COMMUNITY): Payer: Self-pay | Admitting: Orthopedic Surgery

## 2024-02-22 MED ORDER — TRANEXAMIC ACID 1000 MG/10ML IV SOLN
2000.0000 mg | INTRAVENOUS | Status: AC
  Filled 2024-02-22: qty 20

## 2024-02-22 NOTE — Progress Notes (Signed)
 Anesthesia Chart Review: SAME DAY WORK-UP  Case: [id number] Date/Time: 02/23/24 1130   Procedure: AMPUTATION, FOOT, PARTIAL (Left: Foot)   Anesthesia type: Choice   Diagnosis: Gangrene of toe (HCC) [I96]   Pre-op diagnosis: dehiscence amputation and gangrene of toes   [facility] ROOM 12 / [facility]   Surgeons: Harden Jerona GAILS, MD       DISCUSSION: Patient is a 63 year old male scheduled for the above procedure.  Admitted 01/18/24-01/23/24 for sepsis secondary to left diabetic foot infection/early osteomyelitis. S/p left 2nd toe ray amputation 01/20/24.  History includes never smoker, DM2 (diagnosed 01/2024, A1c 7.5%), asthma, HTN, Graves' disease (s/p RAI treatment 06/26/98). S/p left 2nd toe ray amputation 01/20/24.  Anesthesia team to evaluate on the day of surgery. Updated labs and EKG as indicated.    VS:  Wt Readings from Last 3 Encounters:  01/18/24 (!) 153.3 kg  09/27/15 (!) 167.8 kg  03/05/14 (!) 172.4 kg   BP Readings from Last 3 Encounters:  01/23/24 117/69  09/28/15 128/82  03/05/14 148/83   Pulse Readings from Last 3 Encounters:  01/23/24 75  09/28/15 77  03/05/14 76     PROVIDERS: [name], MD is PCP   LABS: For day of surgery as indicated. Last results in [facility] include: Lab Results  Component Value Date   WBC 10.6 (H) 01/19/2024   HGB 10.4 (L) 01/19/2024   HCT 31.0 (L) 01/19/2024   PLT 301 01/19/2024   GLUCOSE 171 (H) 01/19/2024   CHOL 166 01/18/2024   TRIG 119 01/18/2024   HDL 15 (L) 01/18/2024   LDLCALC 127 (H) 01/18/2024   ALT 26 07/14/2012   AST 101 (H) 07/14/2012   NA 134 (L) 01/19/2024   K 3.7 01/19/2024   CL 98 01/19/2024   CREATININE 0.99 01/19/2024   BUN 13 01/19/2024   CO2 26 01/19/2024   HGBA1C 7.5 (H) 01/18/2024     EKG: For day of surgery as indicated. Last EKG noted is from December 2013 and showed NSR.   CV: N/A  Past Medical History:  Diagnosis Date   Acute respiratory failure with hypoxia (HCC) 07/13/2012   Secondary to  multi-lobar bilateral pneumonia   Anemia    Asthma    Community acquired pneumonia 07/13/2012   Bilateral/multi-lobar   Diabetes mellitus without complication (HCC)    type 2   Hypertension     Past Surgical History:  Procedure Laterality Date   AMPUTATION TOE Left 01/20/2024   Procedure: AMPUTATION, TOE;  Surgeon: Harden Jerona GAILS, MD;  Location: [facility] OR;  Service: Orthopedics;  Laterality: Left;  LEFT FOOT 2ND RAY    MEDICATIONS:  [START ON 02/23/2024] tranexamic acid  (CYKLOKAPRON ) 2,000 mg in sodium chloride  0.9 % 50 mL Topical Application    acetaminophen  (TYLENOL ) 325 MG tablet   metFORMIN  (GLUCOPHAGE ) 500 MG tablet   nitroGLYCERIN  (NITRODUR - DOSED IN MG/24 HR) 0.2 mg/hr patch    Isaiah Ruder, PA-C Surgical Short Stay/Anesthesiology [facility] Phone [phone] [facility] Phone [phone] 02/22/2024 4:29 PM

## 2024-02-22 NOTE — Anesthesia Preprocedure Evaluation (Signed)
 Anesthesia Evaluation    Airway        Dental   Pulmonary           Cardiovascular hypertension,      Neuro/Psych    GI/Hepatic   Endo/Other  diabetes    Renal/GU      Musculoskeletal   Abdominal   Peds  Hematology   Anesthesia Other Findings   Reproductive/Obstetrics                              Anesthesia Physical Anesthesia Plan  ASA:   Anesthesia Plan:    Post-op Pain Management:    Induction:   PONV Risk Score and Plan:   Airway Management Planned:   Additional Equipment:   Intra-op Plan:   Post-operative Plan:   Informed Consent:   Plan Discussed with:   Anesthesia Plan Comments: (PAT note written 02/22/2024 by Gunter Conde, PA-C.  )        Anesthesia Quick Evaluation

## 2024-02-23 ENCOUNTER — Encounter (HOSPITAL_COMMUNITY): Admission: RE | Payer: Self-pay | Source: Home / Self Care

## 2024-02-23 ENCOUNTER — Encounter (HOSPITAL_COMMUNITY): Payer: Self-pay | Admitting: Vascular Surgery

## 2024-02-23 ENCOUNTER — Ambulatory Visit (HOSPITAL_COMMUNITY): Admission: RE | Admit: 2024-02-23 | Source: Home / Self Care | Admitting: Orthopedic Surgery

## 2024-02-23 HISTORY — DX: Type 2 diabetes mellitus without complications: E11.9

## 2024-02-23 HISTORY — DX: Anemia, unspecified: D64.9

## 2024-02-23 HISTORY — DX: Essential (primary) hypertension: I10

## 2024-02-23 SURGERY — AMPUTATION, FOOT, PARTIAL
Anesthesia: Choice | Site: Foot | Laterality: Left

## 2024-02-23 NOTE — H&P (Signed)
 Fred Mcintosh is an 63 y.o. male.   Chief Complaint: Wound dehiscence left foot HPI: Patient is a 63 year old gentleman is 3 weeks status post left foot second ray amputation. Patient has been full weightbearing. He has been using a nitroglycerin  patch to help microcirculation.   Past Medical History:  Diagnosis Date   Acute respiratory failure with hypoxia (HCC) 07/13/2012   Secondary to multi-lobar bilateral pneumonia   Anemia    Asthma    Community acquired pneumonia 07/13/2012   Bilateral/multi-lobar   Diabetes mellitus without complication (HCC)    type 2   Hypertension     Past Surgical History:  Procedure Laterality Date   AMPUTATION TOE Left 01/20/2024   Procedure: AMPUTATION, TOE;  Surgeon: Harden Jerona GAILS, MD;  Location: [facility] OR;  Service: Orthopedics;  Laterality: Left;  LEFT FOOT 2ND RAY    No family history on file. Social History:  reports that he has never smoked. He has never been exposed to tobacco smoke. He does not have any smokeless tobacco history on file. He reports that he does not drink alcohol and does not use drugs.  Allergies: No Known Allergies  No medications prior to admission.    No results found for this or any previous visit (from the past 48 hours). No results found.  Review of Systems  All other systems reviewed and are negative.   There were no vitals taken for this visit. Physical Exam  Patient is alert, oriented, no adenopathy, well-dressed, normal affect, normal respiratory effort. Examination patient has a good palpable dorsalis pedis pulse.  He has progressive dry gangrenous changes of the third toe with ischemic changes to the great toe and fourth toe.  There is dehiscence of the second ray amputation.  He is using the nitroglycerin  patch without improvement. Assessment/Plan 1. Non-healing ulcer of left foot, unspecified ulcer stage (HCC)       Plan: Due to patient's microcirculatory disease recommended proceeding with a  transmetatarsal amputation.  Discussed the importance of strict nonweightbearing for the first 2 weeks.  Recommended a kneeling scooter.  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

## 2024-03-01 ENCOUNTER — Encounter: Payer: Self-pay | Admitting: Family

## 2024-03-01 ENCOUNTER — Ambulatory Visit (INDEPENDENT_AMBULATORY_CARE_PROVIDER_SITE_OTHER): Admitting: Family

## 2024-03-01 DIAGNOSIS — L02612 Cutaneous abscess of left foot: Secondary | ICD-10-CM

## 2024-03-01 DIAGNOSIS — L97509 Non-pressure chronic ulcer of other part of unspecified foot with unspecified severity: Secondary | ICD-10-CM

## 2024-03-01 DIAGNOSIS — E11628 Type 2 diabetes mellitus with other skin complications: Secondary | ICD-10-CM

## 2024-03-01 DIAGNOSIS — L97529 Non-pressure chronic ulcer of other part of left foot with unspecified severity: Secondary | ICD-10-CM

## 2024-03-01 DIAGNOSIS — L089 Local infection of the skin and subcutaneous tissue, unspecified: Secondary | ICD-10-CM

## 2024-03-01 DIAGNOSIS — E11621 Type 2 diabetes mellitus with foot ulcer: Secondary | ICD-10-CM

## 2024-03-01 MED ORDER — DOXYCYCLINE HYCLATE 100 MG PO TABS: 100.0000 mg | ORAL_TABLET | Freq: Two times a day (BID) | ORAL | 0 refills | Status: DC

## 2024-03-01 NOTE — Progress Notes (Signed)
   Post-Op Visit Note   Patient: Fred Mcintosh           Date of Birth: Apr 08, 1961           MRN: [id number] Visit Date: 03/01/2024 PCP: [name], MD  Chief Complaint: No chief complaint on file.   HPI:  HPI The patient is a 63 year old gentleman who is seen status post left second ray amputation unfortunately this has dehisced.  Unfortunately he did not receive any of the phone calls offering him a surgery time last week Ortho Exam On examination of the left foot he does have a palpable dorsalis pedis pulse there are progressive dry gangrenous changes of the third toe with dehiscence of the second ray amputation there is no purulence no odor no erythema  Visit Diagnoses: No diagnosis found.  Plan: Have told him face-to-face his surgery date and time will plan for transmetatarsal amputation of the left foot he will continue with daily Dial soap cleansing and dry dressing changes  Follow-Up Instructions: No follow-ups on file.   Imaging: No results found.  Orders:  No orders of the defined types were placed in this encounter.  No orders of the defined types were placed in this encounter.    PMFS History: Patient Active Problem List   Diagnosis Date Noted   New onset type 2 diabetes mellitus (HCC) 01/19/2024   Essential hypertension 01/19/2024   Obesity, Class III, BMI 40-49.9 (morbid obesity) 01/19/2024   Hyponatremia 01/19/2024   Sepsis (HCC) 01/19/2024   Abscess of second toe of left foot 01/19/2024   Diabetic infection of left foot (HCC) 01/19/2024   Toe infection 01/18/2024   Anemia 07/18/2012   Acute respiratory failure with hypoxia (HCC) 07/18/2012   Community acquired pneumonia 07/13/2012   Hypokalemia 07/13/2012   Dehydration 07/13/2012   Thrombocytopenia (HCC) 07/13/2012   Past Medical History:  Diagnosis Date   Acute respiratory failure with hypoxia (HCC) 07/13/2012   Secondary to multi-lobar bilateral pneumonia   Anemia    Asthma     Community acquired pneumonia 07/13/2012   Bilateral/multi-lobar   Diabetes mellitus without complication (HCC)    type 2   Hypertension     No family history on file.  Past Surgical History:  Procedure Laterality Date   AMPUTATION TOE Left 01/20/2024   Procedure: AMPUTATION, TOE;  Surgeon: Harden Jerona GAILS, MD;  Location: [facility] OR;  Service: Orthopedics;  Laterality: Left;  LEFT FOOT 2ND RAY   Social History   Occupational History   Not on file  Tobacco Use   Smoking status: Never    Passive exposure: Never   Smokeless tobacco: Not on file  Vaping Use   Vaping status: Never Used  Substance and Sexual Activity   Alcohol use: No   Drug use: No   Sexual activity: Not on file

## 2024-03-06 NOTE — H&P (Cosign Needed Addendum)
 Fred Mcintosh is an 63 y.o. male.   Chief Complaint: Wound dehiscence left foot HPI: Patient is a 63 year old gentleman is 3 weeks status post left foot second ray amputation. Patient has been full weightbearing. He has been using a nitroglycerin  patch to help microcirculation.      Past Medical History:  Diagnosis Date   Acute respiratory failure with hypoxia (HCC) 07/13/2012   Secondary to multi-lobar bilateral pneumonia   Anemia    Asthma    Community acquired pneumonia 07/13/2012   Bilateral/multi-lobar   Diabetes mellitus without complication (HCC)    type 2   Hypertension     Past Surgical History:  Procedure Laterality Date   AMPUTATION TOE Left 01/20/2024   Procedure: AMPUTATION, TOE;  Surgeon: Harden Jerona GAILS, MD;  Location: [facility] OR;  Service: Orthopedics;  Laterality: Left;  LEFT FOOT 2ND RAY    No family history on file. Social History:  reports that he has never smoked. He has never been exposed to tobacco smoke. He does not have any smokeless tobacco history on file. He reports that he does not drink alcohol and does not use drugs.  Allergies: No Known Allergies  No medications prior to admission.    No results found for this or any previous visit (from the past 48 hours). No results found.  Review of Systems  All other systems reviewed and are negative.   There were no vitals taken for this visit.      Physical Exam  Patient is alert, oriented, no adenopathy, well-dressed, normal affect, normal respiratory effort.  On examination of the left foot he does have a palpable dorsalis pedis pulse there are progressive dry gangrenous changes of the third toe with dehiscence of the second ray amputation there is no purulence no odor no erythema   Assessment/Plan  Non-healing ulcer of left foot, unspecified ulcer stage (HCC)       Plan: Due to patient's microcirculatory disease recommended proceeding with a transmetatarsal amputation.  Discussed the  importance of strict nonweightbearing for the first 2 weeks.  Recommended a kneeling scooter.    Maurilio Deland Collet, PA-C 03/06/2024, 1:32 PM

## 2024-03-07 ENCOUNTER — Other Ambulatory Visit: Payer: Self-pay

## 2024-03-07 ENCOUNTER — Encounter (HOSPITAL_COMMUNITY): Payer: Self-pay | Admitting: Orthopedic Surgery

## 2024-03-07 NOTE — Progress Notes (Signed)
 SDW CALL  Patient was given pre-op instructions over the phone. The opportunity was given for the patient to ask questions. No further questions asked. Patient verbalized understanding of instructions given.   PCP - [name], MD  Cardiologist -   PPM/ICD - denies Device Orders - n/a Rep Notified - n/a  Chest x-ray - denies EKG - DOS Stress Test - denies ECHO - denies Cardiac Cath - denies  Sleep Study - denies CPAP - n/a  DM- yes but does not check blood sugar. Last A1c on 01-18-24 was 7.5  Blood Thinner Instructions:denies Aspirin Instructions:n/a  ERAS Protcol -NPO   COVID TEST- no   Anesthesia review: Yes hx of DM, HTN, Asthma and graves disease  Patient denies shortness of breath, fever, cough and chest pain over the phone call   All instructions explained to the patient, with a verbal understanding of the material. Patient agrees to go over the instructions while at home for a better understanding.

## 2024-03-08 ENCOUNTER — Other Ambulatory Visit: Payer: Self-pay

## 2024-03-08 ENCOUNTER — Encounter (HOSPITAL_COMMUNITY): Payer: Self-pay | Admitting: Orthopedic Surgery

## 2024-03-08 ENCOUNTER — Encounter (HOSPITAL_COMMUNITY)
Admission: RE | Disposition: A | Discharge status: Home / Self Care | Payer: Self-pay | Source: Home / Self Care | Attending: Orthopedic Surgery

## 2024-03-08 ENCOUNTER — Ambulatory Visit (HOSPITAL_COMMUNITY): Admitting: Anesthesiology

## 2024-03-08 ENCOUNTER — Encounter (HOSPITAL_COMMUNITY)

## 2024-03-08 ENCOUNTER — Observation Stay (HOSPITAL_COMMUNITY)
Admission: RE | Admit: 2024-03-08 | Discharge: 2024-03-09 | Disposition: A | Discharge status: Home / Self Care | Attending: Orthopedic Surgery | Admitting: Orthopedic Surgery

## 2024-03-08 DIAGNOSIS — T8131XA Disruption of external operation (surgical) wound, not elsewhere classified, initial encounter: Secondary | ICD-10-CM | POA: Diagnosis present

## 2024-03-08 DIAGNOSIS — Y835 Amputation of limb(s) as the cause of abnormal reaction of the patient, or of later complication, without mention of misadventure at the time of the procedure: Secondary | ICD-10-CM | POA: Diagnosis present

## 2024-03-08 DIAGNOSIS — L089 Local infection of the skin and subcutaneous tissue, unspecified: Principal | ICD-10-CM

## 2024-03-08 DIAGNOSIS — Y829 Unspecified medical devices associated with adverse incidents: Secondary | ICD-10-CM | POA: Insufficient documentation

## 2024-03-08 DIAGNOSIS — E11621 Type 2 diabetes mellitus with foot ulcer: Secondary | ICD-10-CM | POA: Diagnosis present

## 2024-03-08 DIAGNOSIS — L02612 Cutaneous abscess of left foot: Secondary | ICD-10-CM | POA: Diagnosis not present

## 2024-03-08 DIAGNOSIS — T8789 Other complications of amputation stump: Principal | ICD-10-CM | POA: Insufficient documentation

## 2024-03-08 DIAGNOSIS — E1152 Type 2 diabetes mellitus with diabetic peripheral angiopathy with gangrene: Secondary | ICD-10-CM | POA: Diagnosis not present

## 2024-03-08 DIAGNOSIS — Z6841 Body Mass Index (BMI) 40.0 and over, adult: Secondary | ICD-10-CM | POA: Diagnosis not present

## 2024-03-08 DIAGNOSIS — Z7984 Long term (current) use of oral hypoglycemic drugs: Secondary | ICD-10-CM | POA: Diagnosis not present

## 2024-03-08 DIAGNOSIS — E66813 Obesity, class 3: Secondary | ICD-10-CM | POA: Diagnosis present

## 2024-03-08 DIAGNOSIS — J45909 Unspecified asthma, uncomplicated: Secondary | ICD-10-CM | POA: Diagnosis not present

## 2024-03-08 DIAGNOSIS — Z89422 Acquired absence of other left toe(s): Secondary | ICD-10-CM

## 2024-03-08 DIAGNOSIS — L97529 Non-pressure chronic ulcer of other part of left foot with unspecified severity: Secondary | ICD-10-CM | POA: Diagnosis present

## 2024-03-08 DIAGNOSIS — I10 Essential (primary) hypertension: Secondary | ICD-10-CM | POA: Diagnosis not present

## 2024-03-08 HISTORY — PX: AMPUTATION: SHX166

## 2024-03-08 HISTORY — DX: Thyrotoxicosis with diffuse goiter without thyrotoxic crisis or storm: E05.00

## 2024-03-08 LAB — CBC
HCT: 32.4 % — ABNORMAL LOW (ref 39.0–52.0)
Hemoglobin: 10.5 g/dL — ABNORMAL LOW (ref 13.0–17.0)
MCH: 29.1 pg (ref 26.0–34.0)
MCHC: 32.4 g/dL (ref 30.0–36.0)
MCV: 89.8 fL (ref 80.0–100.0)
Platelets: 215 K/uL (ref 150–400)
RBC: 3.61 MIL/uL — ABNORMAL LOW (ref 4.22–5.81)
RDW: 13.3 % (ref 11.5–15.5)
WBC: 3.4 K/uL — ABNORMAL LOW (ref 4.0–10.5)
nRBC: 0 % (ref 0.0–0.2)

## 2024-03-08 LAB — GLUCOSE, CAPILLARY
Glucose-Capillary: 103 mg/dL — ABNORMAL HIGH (ref 70–99)
Glucose-Capillary: 89 mg/dL (ref 70–99)
Glucose-Capillary: 104 mg/dL — ABNORMAL HIGH (ref 70–99)
Glucose-Capillary: 82 mg/dL (ref 70–99)
Glucose-Capillary: 125 mg/dL — ABNORMAL HIGH (ref 70–99)

## 2024-03-08 LAB — CBC WITH DIFFERENTIAL/PLATELET
Abs Immature Granulocytes: 0.01 K/uL (ref 0.00–0.07)
Basophils Absolute: 0 K/uL (ref 0.0–0.1)
Basophils Relative: 1 %
Eosinophils Absolute: 0.1 K/uL (ref 0.0–0.5)
Eosinophils Relative: 2 %
HCT: 35.7 % — ABNORMAL LOW (ref 39.0–52.0)
Hemoglobin: 11.6 g/dL — ABNORMAL LOW (ref 13.0–17.0)
Immature Granulocytes: 0 %
Lymphocytes Relative: 41 %
Lymphs Abs: 1.5 K/uL (ref 0.7–4.0)
MCH: 29.5 pg (ref 26.0–34.0)
MCHC: 32.5 g/dL (ref 30.0–36.0)
MCV: 90.8 fL (ref 80.0–100.0)
Monocytes Absolute: 0.3 K/uL (ref 0.1–1.0)
Monocytes Relative: 8 %
Neutro Abs: 1.8 K/uL (ref 1.7–7.7)
Neutrophils Relative %: 48 %
Platelets: 230 K/uL (ref 150–400)
RBC: 3.93 MIL/uL — ABNORMAL LOW (ref 4.22–5.81)
RDW: 13.4 % (ref 11.5–15.5)
WBC: 3.8 K/uL — ABNORMAL LOW (ref 4.0–10.5)
nRBC: 0 % (ref 0.0–0.2)

## 2024-03-08 LAB — COMPREHENSIVE METABOLIC PANEL WITH GFR
ALT: 9 U/L (ref 0–44)
AST: 16 U/L (ref 15–41)
Albumin: 3.2 g/dL — ABNORMAL LOW (ref 3.5–5.0)
Alkaline Phosphatase: 56 U/L (ref 38–126)
Anion gap: 9 (ref 5–15)
BUN: 9 mg/dL (ref 8–23)
CO2: 22 mmol/L (ref 22–32)
Calcium: 9.3 mg/dL (ref 8.9–10.3)
Chloride: 106 mmol/L (ref 98–111)
Creatinine, Ser: 0.95 mg/dL (ref 0.61–1.24)
GFR, Estimated: >60 mL/min (ref >60)
Glucose, Bld: 98 mg/dL (ref 70–99)
Potassium: 3.7 mmol/L (ref 3.5–5.1)
Sodium: 137 mmol/L (ref 135–145)
Total Bilirubin: 1.6 mg/dL — ABNORMAL HIGH (ref 0.0–1.2)
Total Protein: 7.2 g/dL (ref 6.5–8.1)

## 2024-03-08 LAB — CREATININE, SERUM
Creatinine, Ser: 0.86 mg/dL (ref 0.61–1.24)
GFR, Estimated: >60 mL/min (ref >60)

## 2024-03-08 SURGERY — AMPUTATION, FOOT, PARTIAL
Anesthesia: General | Site: Foot | Laterality: Left

## 2024-03-08 MED ORDER — CEFAZOLIN SODIUM-DEXTROSE 3-4 GM/150ML-% IV SOLN
3.0000 g | INTRAVENOUS | Status: AC
  Administered 2024-03-08: 10 g via INTRAVENOUS
  Filled 2024-03-08: qty 150

## 2024-03-08 MED ORDER — NITROGLYCERIN 0.2 MG/HR TD PT24
0.2000 mg | MEDICATED_PATCH | Freq: Every day | TRANSDERMAL | Status: DC
  Administered 2024-03-08 – 2024-03-09 (×2): 0.2 mg via TRANSDERMAL
  Filled 2024-03-08 (×3): qty 1

## 2024-03-08 MED ORDER — HYDROMORPHONE HCL 1 MG/ML IJ SOLN: 0.2500 mg | INTRAMUSCULAR | Status: DC | PRN

## 2024-03-08 MED ORDER — CEFAZOLIN SODIUM-DEXTROSE 2-4 GM/100ML-% IV SOLN
2.0000 g | Freq: Three times a day (TID) | INTRAVENOUS | Status: AC
  Administered 2024-03-08 – 2024-03-09 (×2): 2 g via INTRAVENOUS
  Filled 2024-03-08 (×2): qty 100

## 2024-03-08 MED ORDER — ONDANSETRON HCL 4 MG/2ML IJ SOLN: 4.0000 mg | Freq: Four times a day (QID) | INTRAMUSCULAR | Status: DC | PRN

## 2024-03-08 MED ORDER — OXYCODONE HCL 5 MG/5ML PO SOLN: 5.0000 mg | Freq: Once | ORAL | Status: DC | PRN

## 2024-03-08 MED ORDER — INSULIN ASPART 100 UNIT/ML IJ SOLN
0.0000 [IU] | Freq: Three times a day (TID) | INTRAMUSCULAR | Status: DC
  Administered 2024-03-09: 2 [IU] via SUBCUTANEOUS

## 2024-03-08 MED ORDER — DOCUSATE SODIUM 100 MG PO CAPS
100.0000 mg | ORAL_CAPSULE | Freq: Every day | ORAL | Status: DC
  Administered 2024-03-09: 100 mg via ORAL
  Filled 2024-03-08: qty 1

## 2024-03-08 MED ORDER — MIDAZOLAM HCL 2 MG/2ML IJ SOLN
INTRAMUSCULAR | Status: AC
  Administered 2024-03-08: 2 mg via INTRAVENOUS
  Filled 2024-03-08: qty 2

## 2024-03-08 MED ORDER — ORAL CARE MOUTH RINSE: 15.0000 mL | Freq: Once | OROMUCOSAL | Status: AC

## 2024-03-08 MED ORDER — PROPOFOL 500 MG/50ML IV EMUL
INTRAVENOUS | Status: DC | PRN
  Administered 2024-03-08: 175 ug/kg/min via INTRAVENOUS

## 2024-03-08 MED ORDER — GLYCOPYRROLATE PF 0.2 MG/ML IJ SOSY
PREFILLED_SYRINGE | INTRAMUSCULAR | Status: DC | PRN
Start: 2024-03-08 — End: 2024-03-08
  Administered 2024-03-08 (×2): .1 mg via INTRAVENOUS

## 2024-03-08 MED ORDER — HYDRALAZINE HCL 20 MG/ML IJ SOLN: 5.0000 mg | INTRAMUSCULAR | Status: DC | PRN

## 2024-03-08 MED ORDER — FENTANYL CITRATE (PF) 100 MCG/2ML IJ SOLN
INTRAMUSCULAR | Status: AC
  Administered 2024-03-08: 100 ug via INTRAVENOUS
  Filled 2024-03-08: qty 2

## 2024-03-08 MED ORDER — METOPROLOL TARTRATE 5 MG/5ML IV SOLN: 2.0000 mg | INTRAVENOUS | Status: DC | PRN

## 2024-03-08 MED ORDER — AMISULPRIDE (ANTIEMETIC) 5 MG/2ML IV SOLN: 10.0000 mg | Freq: Once | INTRAVENOUS | Status: DC | PRN

## 2024-03-08 MED ORDER — MIDAZOLAM HCL 2 MG/2ML IJ SOLN
INTRAMUSCULAR | Status: AC
  Filled 2024-03-08: qty 2

## 2024-03-08 MED ORDER — EPHEDRINE SULFATE-NACL 50-0.9 MG/10ML-% IV SOSY
PREFILLED_SYRINGE | INTRAVENOUS | Status: DC | PRN
  Administered 2024-03-08 (×2): 10 mg via INTRAVENOUS

## 2024-03-08 MED ORDER — ACETAMINOPHEN 325 MG PO TABS
650.0000 mg | ORAL_TABLET | Freq: Four times a day (QID) | ORAL | Status: DC | PRN
  Filled 2024-03-08: qty 2

## 2024-03-08 MED ORDER — SODIUM CHLORIDE 0.9 % IV SOLN: INTRAVENOUS | Status: DC

## 2024-03-08 MED ORDER — ROPIVACAINE HCL 5 MG/ML IJ SOLN
INTRAMUSCULAR | Status: DC | PRN
  Administered 2024-03-08: 30 mL via PERINEURAL

## 2024-03-08 MED ORDER — HYDROMORPHONE HCL 1 MG/ML IJ SOLN: 0.5000 mg | INTRAMUSCULAR | Status: DC | PRN

## 2024-03-08 MED ORDER — FENTANYL CITRATE (PF) 250 MCG/5ML IJ SOLN
INTRAMUSCULAR | Status: AC
  Filled 2024-03-08: qty 5

## 2024-03-08 MED ORDER — SODIUM CHLORIDE 0.9 % IV SOLN: 12.5000 mg | INTRAVENOUS | Status: DC | PRN

## 2024-03-08 MED ORDER — PHENYLEPHRINE 80 MCG/ML (10ML) SYRINGE FOR IV PUSH (FOR BLOOD PRESSURE SUPPORT)
PREFILLED_SYRINGE | INTRAVENOUS | Status: DC | PRN
  Administered 2024-03-08 (×3): 80 ug via INTRAVENOUS

## 2024-03-08 MED ORDER — MIDAZOLAM HCL 2 MG/2ML IJ SOLN: 2.0000 mg | Freq: Once | INTRAMUSCULAR | Status: AC

## 2024-03-08 MED ORDER — FENTANYL CITRATE (PF) 100 MCG/2ML IJ SOLN: 100.0000 ug | Freq: Once | INTRAMUSCULAR | Status: AC

## 2024-03-08 MED ORDER — LIDOCAINE 2% (20 MG/ML) 5 ML SYRINGE
INTRAMUSCULAR | Status: DC | PRN
  Administered 2024-03-08: 50 mg via INTRAVENOUS

## 2024-03-08 MED ORDER — CHLORHEXIDINE GLUCONATE 0.12 % MT SOLN
15.0000 mL | Freq: Once | OROMUCOSAL | Status: AC
  Administered 2024-03-08: 15 mL via OROMUCOSAL
  Filled 2024-03-08: qty 15

## 2024-03-08 MED ORDER — LABETALOL HCL 5 MG/ML IV SOLN: 10.0000 mg | INTRAVENOUS | Status: DC | PRN

## 2024-03-08 MED ORDER — PROPOFOL 10 MG/ML IV BOLUS
INTRAVENOUS | Status: DC | PRN
  Administered 2024-03-08: 200 mg via INTRAVENOUS

## 2024-03-08 MED ORDER — HEPARIN SODIUM (PORCINE) 5000 UNIT/ML IJ SOLN
5000.0000 [IU] | Freq: Three times a day (TID) | INTRAMUSCULAR | Status: DC
  Administered 2024-03-09: 5000 [IU] via SUBCUTANEOUS
  Filled 2024-03-08: qty 1

## 2024-03-08 MED ORDER — METFORMIN HCL 500 MG PO TABS
1000.0000 mg | ORAL_TABLET | Freq: Two times a day (BID) | ORAL | Status: DC
  Administered 2024-03-08 – 2024-03-09 (×2): 1000 mg via ORAL
  Filled 2024-03-08 (×2): qty 2

## 2024-03-08 MED ORDER — INSULIN ASPART 100 UNIT/ML IJ SOLN: 0.0000 [IU] | INTRAMUSCULAR | Status: DC | PRN

## 2024-03-08 MED ORDER — OXYCODONE HCL 5 MG PO TABS: 10.0000 mg | ORAL_TABLET | ORAL | Status: DC | PRN

## 2024-03-08 MED ORDER — LEVOTHYROXINE SODIUM 50 MCG PO TABS
50.0000 ug | ORAL_TABLET | Freq: Every day | ORAL | Status: DC
  Administered 2024-03-09: 50 ug via ORAL
  Filled 2024-03-08: qty 1

## 2024-03-08 MED ORDER — FENTANYL CITRATE (PF) 250 MCG/5ML IJ SOLN
INTRAMUSCULAR | Status: DC | PRN
  Administered 2024-03-08: 50 ug via INTRAVENOUS

## 2024-03-08 MED ORDER — PHENOL 1.4 % MT LIQD: 1.0000 | OROMUCOSAL | Status: DC | PRN

## 2024-03-08 MED ORDER — VASHE WOUND IRRIGATION OPTIME
TOPICAL | Status: DC | PRN
Start: 2024-03-08 — End: 2024-03-08
  Administered 2024-03-08: 34 [oz_av]

## 2024-03-08 MED ORDER — OXYCODONE HCL 5 MG PO TABS
5.0000 mg | ORAL_TABLET | ORAL | Status: DC | PRN
  Administered 2024-03-09: 5 mg via ORAL
  Filled 2024-03-08: qty 1

## 2024-03-08 MED ORDER — LACTATED RINGERS IV SOLN: INTRAVENOUS | Status: DC

## 2024-03-08 MED ORDER — OXYCODONE HCL 5 MG PO TABS: 5.0000 mg | ORAL_TABLET | Freq: Once | ORAL | Status: DC | PRN

## 2024-03-08 MED ORDER — POTASSIUM CHLORIDE CRYS ER 20 MEQ PO TBCR: 40.0000 meq | EXTENDED_RELEASE_TABLET | Freq: Every day | ORAL | Status: DC | PRN

## 2024-03-08 MED ORDER — ONDANSETRON HCL 4 MG/2ML IJ SOLN
INTRAMUSCULAR | Status: DC | PRN
  Administered 2024-03-08: 4 mg via INTRAVENOUS

## 2024-03-08 MED ORDER — ACETAMINOPHEN 500 MG PO TABS
1000.0000 mg | ORAL_TABLET | Freq: Four times a day (QID) | ORAL | Status: AC
  Administered 2024-03-08 – 2024-03-09 (×4): 1000 mg via ORAL
  Filled 2024-03-08 (×4): qty 2

## 2024-03-08 SURGICAL SUPPLY — 31 items
BAG COUNTER SPONGE SURGICOUNT (BAG) ×1 IMPLANT
BENZOIN TINCTURE PRP APPL 2/3 (GAUZE/BANDAGES/DRESSINGS) ×1 IMPLANT
BLADE SAW SGTL HD 18.5X60.5X1. (BLADE) ×1 IMPLANT
BLADE SURG 21 STRL SS (BLADE) ×1 IMPLANT
BNDG COHESIVE 4X5 TAN STRL LF (GAUZE/BANDAGES/DRESSINGS) IMPLANT
BNDG GAUZE DERMACEA FLUFF 4 (GAUZE/BANDAGES/DRESSINGS) IMPLANT
CANISTER WOUND CARE 500ML ATS (WOUND CARE) IMPLANT
COVER SURGICAL LIGHT HANDLE (MISCELLANEOUS) ×1 IMPLANT
DRAPE INCISE IOBAN 66X45 STRL (DRAPES) ×1 IMPLANT
DRAPE U-SHAPE 47X51 STRL (DRAPES) ×1 IMPLANT
DRSG ADAPTIC 3X8 NADH LF (GAUZE/BANDAGES/DRESSINGS) IMPLANT
DURAPREP 26ML APPLICATOR (WOUND CARE) ×1 IMPLANT
ELECTRODE REM PT RTRN 9FT ADLT (ELECTROSURGICAL) ×1 IMPLANT
GAUZE PAD ABD 8X10 STRL (GAUZE/BANDAGES/DRESSINGS) IMPLANT
GAUZE SPONGE 4X4 12PLY STRL (GAUZE/BANDAGES/DRESSINGS) IMPLANT
GLOVE BIOGEL PI IND STRL 9 (GLOVE) ×1 IMPLANT
GLOVE SURG ORTHO 9.0 STRL STRW (GLOVE) ×1 IMPLANT
GOWN STRL REUS W/ TWL XL LVL3 (GOWN DISPOSABLE) ×3 IMPLANT
GRAFT SKIN WND MICRO 38 (Tissue) IMPLANT
KIT BASIN OR (CUSTOM PROCEDURE TRAY) ×1 IMPLANT
KIT TURNOVER KIT B (KITS) ×1 IMPLANT
NS IRRIG 1000ML POUR BTL (IV SOLUTION) ×1 IMPLANT
PACK ORTHO EXTREMITY (CUSTOM PROCEDURE TRAY) ×1 IMPLANT
PAD ARMBOARD POSITIONER FOAM (MISCELLANEOUS) ×2 IMPLANT
SPONGE T-LAP 18X18 ~~LOC~~+RFID (SPONGE) IMPLANT
SUT ETHILON 2 0 PSLX (SUTURE) ×2 IMPLANT
TOWEL GREEN STERILE (TOWEL DISPOSABLE) ×1 IMPLANT
TOWEL GREEN STERILE FF (TOWEL DISPOSABLE) ×1 IMPLANT
TUBE CONNECTING 12X1/4 (SUCTIONS) ×1 IMPLANT
WATER STERILE IRR 1000ML POUR (IV SOLUTION) ×1 IMPLANT
YANKAUER SUCT BULB TIP NO VENT (SUCTIONS) ×1 IMPLANT

## 2024-03-08 NOTE — Op Note (Signed)
 03/08/2024  9:51 AM  PATIENT:  Fred Mcintosh    PRE-OPERATIVE DIAGNOSIS:  dehiscence amputation and gangrene of toes  POST-OPERATIVE DIAGNOSIS:  Same  PROCEDURE:  LEFT TRANSMETATARSAL AMPUTATION Application Kerecis micro graft 38 cm.  SURGEON:  Jerona LULLA Sage, MD  PHYSICIAN ASSISTANT:None ANESTHESIA:   General  PREOPERATIVE INDICATIONS:  Fred Mcintosh is a  63 y.o. male with a diagnosis of dehiscence amputation and gangrene of toes who failed conservative measures and elected for surgical management.    The risks benefits and alternatives were discussed with the patient preoperatively including but not limited to the risks of infection, bleeding, nerve injury, cardiopulmonary complications, the need for revision surgery, among others, and the patient was willing to proceed.  OPERATIVE IMPLANTS:   Implant Name Type Inv. Item Serial No. Manufacturer Lot No. LRB No. Used Action  GRAFT SKIN WND MICRO 38 - [id number] Tissue GRAFT SKIN WND MICRO 38  [employer] [id number] Left 1 Implanted    @ENCIMAGES @  OPERATIVE FINDINGS: Tissue margins were clear.  Abscess was distal to the surgical incision.  OPERATIVE PROCEDURE: Patient was brought the operating room and underwent a general anesthetic.  After adequate levels anesthesia were obtained patient's left lower extremity was prepped using DuraPrep draped into a sterile field a timeout was called.  A fishmouth incision was made proximal to the wound dehiscence with abscess.  This was carried sharply down to bone and a oscillating saw was used to perform a transmetatarsal amputation.  The tissue margins were clear.  Electrocautery was used for hemostasis.  The wound was irrigated with Vashe.  The wound bed was filled with 38 cm of Kerecis micro graft.  The incision was closed using 2-0 nylon a sterile dressing was applied patient was extubated taken the PACU in stable condition.  DISCHARGE PLANNING:  Antibiotic duration: 24 hours  antibiotics  Weightbearing: Touchdown weightbearing on the left  Pain medication: Opioid pathway  Dressing care/ Wound VAC: Dry dressing reinforce as needed  Ambulatory devices: Walker or crutches  Discharge to: Admit and discharge when safe with therapy.  Follow-up: In the office 1 week post operative.

## 2024-03-08 NOTE — Anesthesia Procedure Notes (Signed)
 Anesthesia Regional Block: Popliteal block   Pre-Anesthetic Checklist: , timeout performed,  Correct Patient, Correct Site, Correct Laterality,  Correct Procedure, Correct Position, site marked,  Risks and benefits discussed,  Surgical consent,  Pre-op evaluation,  At surgeon's request and post-op pain management  Laterality: Left  Prep: chloraprep       Needles:  Injection technique: Single-shot  Needle Type: Stimiplex     Needle Length: 9cm  Needle Gauge: 21     Additional Needles:   Procedures:,,,, ultrasound used (permanent image in chart),,    Narrative:  Start time: 03/08/2024 8:23 AM End time: 03/08/2024 8:28 AM Injection made incrementally with aspirations every 5 mL.  Performed by: Personally  Anesthesiologist: Cleotilde Butler Dade, MD

## 2024-03-08 NOTE — Anesthesia Preprocedure Evaluation (Signed)
 Anesthesia Evaluation  Patient identified by MRN, date of birth, ID band Patient awake    Reviewed: Allergy & Precautions, NPO status , Patient's Chart, lab work & pertinent test results  Airway Mallampati: II  TM Distance: >3 FB Neck ROM: Full    Dental  (+) Loose,    Pulmonary asthma , pneumonia   Pulmonary exam normal        Cardiovascular hypertension, Pt. on medications Normal cardiovascular exam     Neuro/Psych negative neurological ROS  negative psych ROS   GI/Hepatic negative GI ROS, Neg liver ROS,,,  Endo/Other  diabetes, Type 2  Class 3 obesity  Renal/GU negative Renal ROS     Musculoskeletal negative musculoskeletal ROS (+)    Abdominal  (+) + obese  Peds  Hematology  (+) Blood dyscrasia, anemia   Anesthesia Other Findings   Reproductive/Obstetrics                              Anesthesia Physical Anesthesia Plan  ASA: 3  Anesthesia Plan: General   Post-op Pain Management: Regional block* and Minimal or no pain anticipated   Induction: Intravenous  PONV Risk Score and Plan: 2 and Treatment may vary due to age or medical condition, Ondansetron  and Midazolam   Airway Management Planned: LMA  Additional Equipment:   Intra-op Plan:   Post-operative Plan: Extubation in OR  Informed Consent: I have reviewed the patients History and Physical, chart, labs and discussed the procedure including the risks, benefits and alternatives for the proposed anesthesia with the patient or authorized representative who has indicated his/her understanding and acceptance.     Dental advisory given  Plan Discussed with: CRNA  Anesthesia Plan Comments: (PAT note written 02/22/2024 by Allison Zelenak, PA-C.  )         Anesthesia Quick Evaluation

## 2024-03-08 NOTE — Interval H&P Note (Signed)
 History and Physical Interval Note:  03/08/2024 6:44 AM  Fred Mcintosh  has presented today for surgery, with the diagnosis of dehiscence amputation and gangrene of toes.  The various methods of treatment have been discussed with the patient and family. After consideration of risks, benefits and other options for treatment, the patient has consented to  Procedure(s) with comments: AMPUTATION, FOOT, PARTIAL (Left) - left transmetatarsal amputation as a surgical intervention.  The patient's history has been reviewed, patient examined, no change in status, stable for surgery.  I have reviewed the patient's chart and labs.  Questions were answered to the patient's satisfaction.     Lyra Alaimo V Mirai Greenwood

## 2024-03-08 NOTE — Anesthesia Procedure Notes (Signed)
 Procedure Name: LMA Insertion Date/Time: 03/08/2024 9:27 AM  Performed by: Mollie Olivia SAUNDERS, CRNAPre-anesthesia Checklist: Patient identified, Emergency Drugs available, Suction available and Patient being monitored Patient Re-evaluated:Patient Re-evaluated prior to induction Oxygen Delivery Method: Circle System Utilized Preoxygenation: Pre-oxygenation with 100% oxygen Induction Type: IV induction LMA: LMA inserted LMA Size: 5.0 Number of attempts: 1 Airway Equipment and Method: Bite block Placement Confirmation: positive ETCO2 Tube secured with: Tape Dental Injury: Teeth and Oropharynx as per pre-operative assessment

## 2024-03-08 NOTE — Anesthesia Postprocedure Evaluation (Signed)
 Anesthesia Post Note  Patient: Fred Mcintosh  Procedure(s) Performed: LEFT TRANSMETATARSAL AMPUTATION (Left: Foot)     Patient location during evaluation: PACU Anesthesia Type: General Level of consciousness: awake and alert Pain management: pain level controlled Vital Signs Assessment: post-procedure vital signs reviewed and stable Respiratory status: spontaneous breathing, nonlabored ventilation and respiratory function stable Cardiovascular status: blood pressure returned to baseline and stable Postop Assessment: no apparent nausea or vomiting Anesthetic complications: no   No notable events documented.  Last Vitals:  Vitals:   03/08/24 1015 03/08/24 1100  BP: 131/71 115/61  Pulse: 83 81  Resp: 13 14  Temp:    SpO2: 96% 95%    Last Pain:  Vitals:   03/08/24 1115  TempSrc:   PainSc: 0-No pain                 Butler Levander Pinal

## 2024-03-08 NOTE — Progress Notes (Signed)
 [facility]  ABI performed at [facility] on 01/18/24   COMPARISON:  None available.   FINDINGS: Right ABI:  1.20   Left ABI:  0.98   Right Lower Extremity:  Normal arterial waveforms at the ankle.   Left Lower Extremity:  Normal arterial waveforms at the ankle.   0.9-0.99 Borderline PAD   IMPRESSION: 1. Normal RIGHT lower extremity ankle brachial indices. 2. Minimally decreased LEFT ABI indicative of borderline peripheral arterial disease.  Maurilio Deland Collet PA-C

## 2024-03-08 NOTE — Transfer of Care (Signed)
 Immediate Anesthesia Transfer of Care Note  Patient: Fred Mcintosh  Procedure(s) Performed: LEFT TRANSMETATARSAL AMPUTATION (Left: Foot)  Patient Location: PACU  Anesthesia Type:GA combined with regional for post-op pain  Level of Consciousness: awake, oriented, and drowsy  Airway & Oxygen Therapy: Patient Spontanous Breathing and Patient connected to face mask oxygen  Post-op Assessment: Report given to RN, Post -op Vital signs reviewed and stable, and Patient moving all extremities X 4  Post vital signs: Reviewed and stable  Last Vitals:  Vitals Value Taken Time  BP 132/69 03/08/24 10:11  Temp 36.6 C 03/08/24 10:10  Pulse 81 03/08/24 10:15  Resp 12 03/08/24 10:15  SpO2 96 % 03/08/24 10:15  Vitals shown include unfiled device data.  Last Pain:  Vitals:   03/08/24 0833  TempSrc:   PainSc: 0-No pain      Patients Stated Pain Goal: 0 (03/08/24 9166)  Complications: No notable events documented.

## 2024-03-09 DIAGNOSIS — T8789 Other complications of amputation stump: Secondary | ICD-10-CM | POA: Diagnosis not present

## 2024-03-09 LAB — GLUCOSE, CAPILLARY
Glucose-Capillary: 134 mg/dL — ABNORMAL HIGH (ref 70–99)
Glucose-Capillary: 123 mg/dL — ABNORMAL HIGH (ref 70–99)

## 2024-03-09 LAB — BASIC METABOLIC PANEL WITH GFR
Anion gap: 8 (ref 5–15)
BUN: 9 mg/dL (ref 8–23)
CO2: 26 mmol/L (ref 22–32)
Calcium: 8.5 mg/dL — ABNORMAL LOW (ref 8.9–10.3)
Chloride: 102 mmol/L (ref 98–111)
Creatinine, Ser: 0.93 mg/dL (ref 0.61–1.24)
GFR, Estimated: >60 mL/min (ref >60)
Glucose, Bld: 134 mg/dL — ABNORMAL HIGH (ref 70–99)
Potassium: 3.8 mmol/L (ref 3.5–5.1)
Sodium: 136 mmol/L (ref 135–145)

## 2024-03-09 LAB — CBC
HCT: 32 % — ABNORMAL LOW (ref 39.0–52.0)
Hemoglobin: 10.6 g/dL — ABNORMAL LOW (ref 13.0–17.0)
MCH: 29.8 pg (ref 26.0–34.0)
MCHC: 33.1 g/dL (ref 30.0–36.0)
MCV: 89.9 fL (ref 80.0–100.0)
Platelets: 237 K/uL (ref 150–400)
RBC: 3.56 MIL/uL — ABNORMAL LOW (ref 4.22–5.81)
RDW: 13.2 % (ref 11.5–15.5)
WBC: 5.9 K/uL (ref 4.0–10.5)
nRBC: 0 % (ref 0.0–0.2)

## 2024-03-09 MED ORDER — OXYCODONE-ACETAMINOPHEN 5-325 MG PO TABS
1.0000 | ORAL_TABLET | ORAL | 0 refills | Status: DC | PRN
Start: 2024-03-09 — End: 2024-05-01

## 2024-03-09 NOTE — Progress Notes (Signed)
 Inpatient Rehab Admissions Coordinator:  Consult received. Note therapy recommending HH therapy. TOC made aware. AC will sign off.   Tinnie Yvone Cohens, MS, CCC-SLP Admissions Coordinator [phone]

## 2024-03-09 NOTE — TOC Transition Note (Addendum)
 Transition of Care [facility]) - Discharge Note   Patient Details  Name: RAMAR NOBREGA MRN: [id number] Date of Birth: [birth year]  Transition of [facility] CM/SW Contact:  Robynn Eileen Hoose, RN Phone Number: 03/09/2024, 1:33 PM   Clinical Narrative:   Attempt made to find [facility] agency to accept patient for PT/OT. Cory with [facility] denied due to out of network.           Patient Goals and CMS Choice            Discharge Placement                       Discharge Plan and Services Additional resources added to the After Visit Summary for                                       Social Drivers of Health (SDOH) Interventions SDOH Screenings   Food Insecurity: No Food Insecurity (03/08/2024)  Housing: Low Risk  (03/08/2024)  Transportation Needs: No Transportation Needs (03/08/2024)  Utilities: Not At Risk (03/08/2024)  Social Connections: Moderately Integrated (01/18/2024)  Tobacco Use: Unknown (03/08/2024)     Readmission Risk Interventions     No data to display

## 2024-03-09 NOTE — Plan of Care (Signed)

## 2024-03-09 NOTE — Evaluation (Signed)
 Physical Therapy Evaluation Patient Details Name: Fred Mcintosh MRN: [id number] DOB: 12-Aug-1960 Today's Date: 03/09/2024  History of Present Illness  Fred Mcintosh is an 63 y.o. male admitted 03/08/24 for dehiscence amputation and gangrene of toes. Pt s/p L TMA 8/22. 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  Clinical Impression  Pt admitted with above diagnosis. PTA, pt was independent with functional mobility, ADLs/IADLs, and driving. He lives with his mom and brother in a one story house with 5 STE. Pt currently with functional limitations due to the deficits listed below (see PT Problem List). He performed bed mobility with supervision and required CGA for OOB  mobility using RW. Utilized +2 assist throughout functional mobility for safety and line management. Pt demonstrated poor insight and decreased safety awareness. He is unable to maintain LLE TDWB through heel or NWB despite extensive education and multi-modal cues. Per secure chat with Dr. Harden after evaluation, pt can be wbat and just minimize activities. Pt will benefit from acute skilled PT to increase his independence and safety with mobility to allow discharge. Recommend HHPT to improve balance, decrease fall risk, and optimize safety within the home environment.      If plan is discharge home, recommend the following: A little help with walking and/or transfers;A little help with bathing/dressing/bathroom;Assistance with cooking/housework;Assist for transportation;Help with stairs or ramp for entrance   Can travel by private vehicle        Equipment Recommendations Wheelchair (measurements PT);Wheelchair cushion (measurements PT);BSC/3in1  Recommendations for Other Services       Functional Status Assessment Patient has had a recent decline in their functional status and demonstrates the ability to make significant improvements in function in a reasonable and predictable amount of time.     Precautions /  Restrictions Precautions Precautions: Fall Recall of Precautions/Restrictions: Impaired Precaution/Restrictions Comments: Pt unable to maintain LLE NWB or TDWB through heel despite extensive education and multi-modal cues. Required Braces or Orthoses: Other Brace Other Brace: Post-Op Shoe LLE Restrictions Weight Bearing Restrictions Per Provider Order: Yes LLE Weight Bearing Per Provider Order: Touchdown weight bearing (patient may be weightbearing on the heel as needed however minimizing weightbearing is ideal. Per secure chat with Dr. Harden after session He can be wbat and just minimize activities.)      Mobility  Bed Mobility Overal bed mobility: Needs Assistance Bed Mobility: Supine to Sit     Supine to sit: Supervision, HOB elevated, Used rails     General bed mobility comments: Pt sat up on L side of bed with increased time. Supervision for safety.    Transfers Overall transfer level: Needs assistance Equipment used: Rolling walker (2 wheels) Transfers: Sit to/from Stand, Bed to chair/wheelchair/BSC Sit to Stand: Contact guard assist, +2 safety/equipment   Step pivot transfers: Contact guard assist, +2 safety/equipment       General transfer comment: Pt stood from lowest bed height. Cued proper hand placement using RW. Powered up with light assist. Cues to minimize LLE weight bearing. Pt appeared to allow full load on L foot prior to standing. Transferred to recliner chair. Poor eccentric control, plopping down.    Ambulation/Gait Ambulation/Gait assistance: Contact guard assist, +2 safety/equipment Gait Distance (Feet): 15 Feet Assistive device: Rolling walker (2 wheels) Gait Pattern/deviations: Step-to pattern, Decreased step length - right, Decreased step length - left, Wide base of support, Drifts right/left Gait velocity: decr Gait velocity interpretation: <1.31 ft/sec, indicative of household ambulator   General Gait Details: Educated pt on proper and  safe  use of RW. Demonstrated technique to maintain LLE TDWB through heel vs hopping technique to maintian LLE NWB. Cues for sequencing and safety awareness. Pt appeared to accept full weight on L foot. He had difficulty processing how to take a step while maintaining L foot in the air. Instructed increased hip/knee flex and to hold the foot in front or behind him. Pt was able to go ~2-47ft but would intermittently put the front of his post-op shoe on the ground to stabilize. Pt unsteady with significant postural sway. He would occasionally lift AD off floor. Cued pt to maintain all four point in contact with the ground.  Stairs            Wheelchair Mobility     Tilt Bed    Modified Rankin (Stroke Patients Only)       Balance Overall balance assessment: Needs assistance Sitting-balance support: No upper extremity supported, Feet supported Sitting balance-Leahy Scale: Good Sitting balance - Comments: Pt sat EOB with supervision. He was able to reach outside his BOS and adjust post-op shoe.   Standing balance support: Bilateral upper extremity supported, During functional activity, Reliant on assistive device for balance Standing balance-Leahy Scale: Poor Standing balance comment: Pt dependent on RW and +2 assist for safety.                             Pertinent Vitals/Pain Pain Assessment Pain Assessment: No/denies pain    Home Living Family/patient expects to be discharged to:: Private residence Living Arrangements: Parent;Other relatives Available Help at Discharge: Family;Available 24 hours/day Type of Home: House Home Access: Stairs to enter Entrance Stairs-Rails: Right;Left;Can reach both Entrance Stairs-Number of Steps: 5   Home Layout: One level Home Equipment: Grab bars - tub/shower;Shower seat;Cane - single Librarian, academic (2 wheels)      Prior Function Prior Level of Function : Independent/Modified Independent;Driving             Mobility  Comments: Indep without AD. Denies falls in the last 82mo. ADLs Comments: Indep with ADLs/IADLs. 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     Extremity/Trunk Assessment   Upper Extremity Assessment Upper Extremity Assessment: Generalized weakness    Lower Extremity Assessment Lower Extremity Assessment: Defer to PT evaluation LLE Deficits / Details: Pt s/p TMA in post-op shoe. He demonstrated WFL hip, knee, and ankle ROM. Grossly 3/5 strength. LLE Sensation: decreased proprioception LLE Coordination: decreased gross motor    Cervical / Trunk Assessment Cervical / Trunk Assessment: Other exceptions Cervical / Trunk Exceptions: Body Habitus  Communication   Communication Communication: No apparent difficulties    Cognition Arousal: Alert Behavior During Therapy: WFL for tasks assessed/performed   PT - Cognitive impairments: No family/caregiver present to determine baseline, Sequencing, Problem solving, Safety/Judgement                       PT - Cognition Comments: Pt A,Ox4. He has poor insight and awareness to current health situation. Pt reported he is not interested in DME or follow-up therapy, reporting he can manage on his own and will do it how he sees fit. Following commands: Impaired Following commands impaired: Follows multi-step commands with increased time     Cueing Cueing Techniques: Verbal cues, Gestural cues, Visual cues     General Comments General comments (skin integrity, edema, etc.): noted some bleeding from L  foot dressing underneath coban wrap    Exercises     Assessment/Plan    PT Assessment Patient needs continued PT services  PT Problem List Decreased knowledge of precautions;Decreased safety awareness;Decreased balance;Decreased mobility       PT Treatment Interventions DME instruction;Gait training;Stair training;Functional mobility training;Therapeutic activities;Therapeutic  exercise;Balance training;Patient/family education;Wheelchair mobility training    PT Goals (Current goals can be found in the Care Plan section)  Acute Rehab PT Goals Patient Stated Goal: Return Home PT Goal Formulation: With patient Time For Goal Achievement: 03/23/24 Potential to Achieve Goals: Good    Frequency Min 2X/week     Co-evaluation PT/OT/SLP Co-Evaluation/Treatment: Yes Reason for Co-Treatment: Necessary to address cognition/behavior during functional activity;To address functional/ADL transfers PT goals addressed during session: Mobility/safety with mobility;Balance;Proper use of DME OT goals addressed during session: Strengthening/ROM;ADL's and self-care       AM-PAC PT 6 Clicks Mobility  Outcome Measure Help needed turning from your back to your side while in a flat bed without using bedrails?: A Little Help needed moving from lying on your back to sitting on the side of a flat bed without using bedrails?: A Little Help needed moving to and from a bed to a chair (including a wheelchair)?: A Little Help needed standing up from a chair using your arms (e.g., wheelchair or bedside chair)?: A Little Help needed to walk in hospital room?: A Little Help needed climbing 3-5 steps with a railing? : A Little 6 Click Score: 18    End of Session   Activity Tolerance: Patient tolerated treatment well Patient left: in chair;with call bell/phone within reach Nurse Communication: Mobility status;Other (comment) (bleeding on dressing; no green box to connect chair alarm to) PT Visit Diagnosis: Difficulty in walking, not elsewhere classified (R26.2);Unsteadiness on feet (R26.81);Other abnormalities of gait and mobility (R26.89)    Time: 0912-0939 PT Time Calculation (min) (ACUTE ONLY): 27 min   Charges:   PT Evaluation $PT Eval Moderate Complexity: 1 Mod   PT General Charges $$ ACUTE PT VISIT: 1 Visit         Randall SAUNDERS, PT, DPT Acute Rehabilitation  Services Office: [phone] Secure Chat Preferred  Fred Mcintosh 03/09/2024, 10:49 AM

## 2024-03-09 NOTE — Evaluation (Signed)
 Occupational Therapy Evaluation Patient Details Name: Fred Mcintosh MRN: [id number] DOB: August 29, 1960 Today's Date: 03/09/2024   History of Present Illness   Fred Mcintosh is an 63 y.o. male admitted 03/08/24 for dehiscence amputation and gangrene of toes. Pt s/p L TMA 8/22. 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     Clinical Impressions Pt reports ind at baseline with ADLs and functional mobility, mobilizing without AD PTA. Pt has assist from brother at d/c. Pt currently needing up to mod A for ADLs, supervision for bed mobility and CGA for transfers with RW. Pt with LLE postop shoe donned, difficulty keeping weight of LLE despite education. Pt presenting with impairments listed below, will follow acutely. Recommend HHOT at d/c.      If plan is discharge home, recommend the following:   A little help with walking and/or transfers;A lot of help with bathing/dressing/bathroom;Assistance with cooking/housework;Help with stairs or ramp for entrance;Assist for transportation     Functional Status Assessment   Patient has had a recent decline in their functional status and demonstrates the ability to make significant improvements in function in a reasonable and predictable amount of time.     Equipment Recommendations   BSC/3in1;Wheelchair (measurements OT);Wheelchair cushion (measurements OT)     Recommendations for Other Services   PT consult     Precautions/Restrictions   Precautions Precautions: Fall Recall of Precautions/Restrictions: Impaired Precaution/Restrictions Comments: Pt unable to maintain LLE NWB or TDWB through heel despite extensive education and multi-modal cues. Required Braces or Orthoses: Other Brace Other Brace: Post-Op Shoe LLE Restrictions Weight Bearing Restrictions Per Provider Order: Yes LLE Weight Bearing Per Provider Order: Weight bearing as tolerated (Per secure chat with Dr. Harden, WBAT, preferably through heel; however,  minimizing weight bearing is ideal.)     Mobility Bed Mobility Overal bed mobility: Needs Assistance Bed Mobility: Supine to Sit     Supine to sit: Supervision, HOB elevated, Used rails          Transfers Overall transfer level: Needs assistance Equipment used: Rolling walker (2 wheels) Transfers: Sit to/from Stand, Bed to chair/wheelchair/BSC Sit to Stand: Contact guard assist, +2 safety/equipment                  Balance Overall balance assessment: Needs assistance Sitting-balance support: No upper extremity supported, Feet supported Sitting balance-Leahy Scale: Good Sitting balance - Comments: Pt sat EOB with supervision. He was able to reach outside his BOS and adjust post-op shoe.   Standing balance support: Bilateral upper extremity supported, During functional activity, Reliant on assistive device for balance Standing balance-Leahy Scale: Poor Standing balance comment: Pt dependent on RW and +2 assist for safety.                           ADL either performed or assessed with clinical judgement   ADL Overall ADL's : Needs assistance/impaired Eating/Feeding: Set up;Sitting   Grooming: Set up;Sitting   Upper Body Bathing: Set up;Sitting   Lower Body Bathing: Moderate assistance;Sitting/lateral leans   Upper Body Dressing : Minimal assistance;Sitting   Lower Body Dressing: Moderate assistance;Sitting/lateral leans   Toilet Transfer: Minimal assistance;Ambulation;Rolling walker (2 wheels);Regular Toilet   Toileting- Clothing Manipulation and Hygiene: Contact guard assist       Functional mobility during ADLs: Minimal assistance;Rolling walker (2 wheels)       Vision   Vision Assessment?: No apparent visual deficits     Perception Perception: Not tested  Praxis Praxis: Not tested       Pertinent Vitals/Pain Pain Assessment Pain Assessment: No/denies pain     Extremity/Trunk Assessment Upper Extremity Assessment Upper  Extremity Assessment: Generalized weakness   Lower Extremity Assessment Lower Extremity Assessment: Defer to PT evaluation LLE Deficits / Details: Pt s/p TMA in post-op shoe. He demonstrated WFL hip, knee, and ankle ROM. Grossly 3/5 strength. LLE Sensation: decreased proprioception LLE Coordination: decreased gross motor   Cervical / Trunk Assessment Cervical / Trunk Assessment: Other exceptions Cervical / Trunk Exceptions: Body Habitus   Communication Communication Communication: No apparent difficulties   Cognition Arousal: Alert Behavior During Therapy: WFL for tasks assessed/performed Cognition: No apparent impairments                               Following commands: Impaired Following commands impaired: Follows multi-step commands with increased time     Cueing  General Comments   Cueing Techniques: Verbal cues;Gestural cues;Visual cues  noted some bleeding from L foot dressing underneath coban wrap   Exercises     Shoulder Instructions      Home Living Family/patient expects to be discharged to:: Private residence Living Arrangements: Parent;Other relatives Available Help at Discharge: Family;Available 24 hours/day Type of Home: House Home Access: Stairs to enter [employer] of Steps: 5 Entrance Stairs-Rails: Right;Left;Can reach both Home Layout: One level     Bathroom Shower/Tub: Producer, television/film/video: Handicapped height     Home Equipment: Grab bars - tub/shower;Shower seat;Cane - single Librarian, academic (2 wheels)          Prior Functioning/Environment Prior Level of Function : Independent/Modified Independent;Driving             Mobility Comments: Indep without AD. Denies falls in the last 27mo. ADLs Comments: Indep with ADLs/IADLs. 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    OT Problem List: Decreased strength;Decreased range of motion;Decreased  activity tolerance;Impaired balance (sitting and/or standing);Decreased safety awareness;Decreased knowledge of precautions   OT Treatment/Interventions: Self-care/ADL training;Therapeutic exercise;Energy conservation;DME and/or AE instruction;Therapeutic activities;Balance training;Patient/family education      OT Goals(Current goals can be found in the care plan section)   Acute Rehab OT Goals Patient Stated Goal: to go home OT Goal Formulation: With patient Time For Goal Achievement: 03/23/24 Potential to Achieve Goals: Good   OT Frequency:  Min 2X/week    Co-evaluation PT/OT/SLP Co-Evaluation/Treatment: Yes Reason for Co-Treatment: Necessary to address cognition/behavior during functional activity;To address functional/ADL transfers PT goals addressed during session: Mobility/safety with mobility;Balance;Proper use of DME OT goals addressed during session: Strengthening/ROM;ADL's and self-care      AM-PAC OT 6 Clicks Daily Activity     Outcome Measure Help from another person eating meals?: A Little Help from another person taking care of personal grooming?: A Little Help from another person toileting, which includes using toliet, bedpan, or urinal?: A Little Help from another person bathing (including washing, rinsing, drying)?: A Lot Help from another person to put on and taking off regular upper body clothing?: A Little Help from another person to put on and taking off regular lower body clothing?: A Lot 6 Click Score: 16   End of Session Equipment Utilized During Treatment: Rolling walker (2 wheels) Nurse Communication: Mobility status  Activity Tolerance: Patient tolerated treatment well Patient left: in chair;with call bell/phone within reach  OT Visit Diagnosis: Unsteadiness on  feet (R26.81);Other abnormalities of gait and mobility (R26.89);Muscle weakness (generalized) (M62.81)                Time: 9088-9061 OT Time Calculation (min): 27 min Charges:  OT  General Charges $OT Visit: 1 Visit OT Evaluation $OT Eval Moderate Complexity: 1 Mod  Kycen Spalla K, OTD, OTR/L SecureChat Preferred Acute Rehab [phone]   Laneta POUR Koonce 03/09/2024, 10:42 AM

## 2024-03-09 NOTE — Progress Notes (Addendum)
 Pt expressed verbal understanding of discharge plan of care.   Denies pain or discomfort at this time.   1 PIV removed dressing intact.   Transporting to lounge by NT to wait for ride.

## 2024-03-09 NOTE — Progress Notes (Signed)
 Orthopedic Tech Progress Note Patient Details:  Fred Mcintosh 21-Aug-1960 [id number]  Ortho Devices Type of Ortho Device: Postop shoe/boot Ortho Device/Splint Location: Left foot Ortho Device/Splint Interventions: Application   Post Interventions Patient Tolerated: Well  Fred Mcintosh Yamen Castrogiovanni 03/09/2024, 8:49 AM

## 2024-03-09 NOTE — Discharge Summary (Signed)
 Physician Discharge Summary  Patient ID: Fred Mcintosh MRN: [id number] DOB/AGE: 01-12-1961 63 y.o.  Admit date: 03/08/2024 Discharge date: 03/09/2024  Admission Diagnoses:  Principal Problem:   Non-healing wound of amputation stump (HCC) Active Problems:   Cutaneous abscess of left foot   Wound dehiscence, surgical, initial encounter   Discharge Diagnoses:  Same  Past Medical History:  Diagnosis Date   Acute respiratory failure with hypoxia (HCC) 07/13/2012   Secondary to multi-lobar bilateral pneumonia   Anemia    Asthma    Community acquired pneumonia 07/13/2012   Bilateral/multi-lobar   Diabetes mellitus without complication (HCC)    type 2   Graves disease    Hypertension     Surgeries: Procedure(s): LEFT TRANSMETATARSAL AMPUTATION on 03/08/2024   Consultants:   Discharged Condition: Improved  Hospital Course: Fred Mcintosh is an 63 y.o. male who was admitted 03/08/2024 with a chief complaint of No chief complaint on file. , and found to have a diagnosis of Non-healing wound of amputation stump (HCC).  They were brought to the operating room on 03/08/2024 and underwent the above named procedures.    They were given perioperative antibiotics:  Anti-infectives (From admission, onward)    Start     Dose/Rate Route Frequency Ordered Stop   03/08/24 1730  ceFAZolin  (ANCEF ) IVPB 2g/100 mL premix        2 g 200 mL/hr over 30 Minutes Intravenous Every 8 hours 03/08/24 1225 03/09/24 0153   03/08/24 0715  ceFAZolin  (ANCEF ) IVPB 3g/150 mL premix        3 g 300 mL/hr over 30 Minutes Intravenous On call to O.R. 03/08/24 [id number] 03/08/24 0931     .  They were given compression stockings, early ambulation, and chemoprophylaxis for DVT prophylaxis.  They benefited maximally from their hospital stay and there were no complications.    Recent vital signs:  Vitals:   03/09/24 0020 03/09/24 0445  BP: 122/73 115/82  Pulse: 92 96  Resp: 17 16  Temp: 98.9 F (37.2  C) 99.1 F (37.3 C)  SpO2: 94% 100%    Recent laboratory studies:  Results for orders placed or performed during the hospital encounter of 03/08/24  Glucose, capillary   Collection Time: 03/08/24  6:47 AM  Result Value Ref Range   Glucose-Capillary 103 (H) 70 - 99 mg/dL  CBC WITH DIFFERENTIAL   Collection Time: 03/08/24  7:20 AM  Result Value Ref Range   WBC 3.8 (L) 4.0 - 10.5 K/uL   RBC 3.93 (L) 4.22 - 5.81 MIL/uL   Hemoglobin 11.6 (L) 13.0 - 17.0 g/dL   HCT 64.2 (L) 60.9 - 47.9 %   MCV 90.8 80.0 - 100.0 fL   MCH 29.5 26.0 - 34.0 pg   MCHC 32.5 30.0 - 36.0 g/dL   RDW 86.5 88.4 - 84.4 %   Platelets 230 150 - 400 K/uL   nRBC 0.0 0.0 - 0.2 %   Neutrophils Relative % 48 %   Neutro Abs 1.8 1.7 - 7.7 K/uL   Lymphocytes Relative 41 %   Lymphs Abs 1.5 0.7 - 4.0 K/uL   Monocytes Relative 8 %   Monocytes Absolute 0.3 0.1 - 1.0 K/uL   Eosinophils Relative 2 %   Eosinophils Absolute 0.1 0.0 - 0.5 K/uL   Basophils Relative 1 %   Basophils Absolute 0.0 0.0 - 0.1 K/uL   Immature Granulocytes 0 %   Abs Immature Granulocytes 0.01 0.00 - 0.07 K/uL  Comprehensive metabolic panel  Collection Time: 03/08/24  7:20 AM  Result Value Ref Range   Sodium 137 135 - 145 mmol/L   Potassium 3.7 3.5 - 5.1 mmol/L   Chloride 106 98 - 111 mmol/L   CO2 22 22 - 32 mmol/L   Glucose, Bld 98 70 - 99 mg/dL   BUN 9 8 - 23 mg/dL   Creatinine, Ser 9.04 0.61 - 1.24 mg/dL   Calcium 9.3 8.9 - 89.6 mg/dL   Total Protein 7.2 6.5 - 8.1 g/dL   Albumin 3.2 (L) 3.5 - 5.0 g/dL   AST 16 15 - 41 U/L   ALT 9 0 - 44 U/L   Alkaline Phosphatase 56 38 - 126 U/L   Total Bilirubin 1.6 (H) 0.0 - 1.2 mg/dL   GFR, Estimated >39 >39 mL/min   Anion gap 9 5 - 15  Glucose, capillary   Collection Time: 03/08/24  8:32 AM  Result Value Ref Range   Glucose-Capillary 89 70 - 99 mg/dL  Glucose, capillary   Collection Time: 03/08/24 10:13 AM  Result Value Ref Range   Glucose-Capillary 104 (H) 70 - 99 mg/dL  CBC   Collection  Time: 03/08/24  2:05 PM  Result Value Ref Range   WBC 3.4 (L) 4.0 - 10.5 K/uL   RBC 3.61 (L) 4.22 - 5.81 MIL/uL   Hemoglobin 10.5 (L) 13.0 - 17.0 g/dL   HCT 67.5 (L) 60.9 - 47.9 %   MCV 89.8 80.0 - 100.0 fL   MCH 29.1 26.0 - 34.0 pg   MCHC 32.4 30.0 - 36.0 g/dL   RDW 86.6 88.4 - 84.4 %   Platelets 215 150 - 400 K/uL   nRBC 0.0 0.0 - 0.2 %  Creatinine, serum   Collection Time: 03/08/24  2:05 PM  Result Value Ref Range   Creatinine, Ser 0.86 0.61 - 1.24 mg/dL   GFR, Estimated >39 >39 mL/min  Glucose, capillary   Collection Time: 03/08/24  4:58 PM  Result Value Ref Range   Glucose-Capillary 82 70 - 99 mg/dL  Glucose, capillary   Collection Time: 03/08/24  9:36 PM  Result Value Ref Range   Glucose-Capillary 125 (H) 70 - 99 mg/dL  CBC   Collection Time: 03/09/24  5:54 AM  Result Value Ref Range   WBC 5.9 4.0 - 10.5 K/uL   RBC 3.56 (L) 4.22 - 5.81 MIL/uL   Hemoglobin 10.6 (L) 13.0 - 17.0 g/dL   HCT 67.9 (L) 60.9 - 47.9 %   MCV 89.9 80.0 - 100.0 fL   MCH 29.8 26.0 - 34.0 pg   MCHC 33.1 30.0 - 36.0 g/dL   RDW 86.7 88.4 - 84.4 %   Platelets 237 150 - 400 K/uL   nRBC 0.0 0.0 - 0.2 %  Glucose, capillary   Collection Time: 03/09/24  6:05 AM  Result Value Ref Range   Glucose-Capillary 134 (H) 70 - 99 mg/dL  Glucose, capillary   Collection Time: 03/09/24  8:03 AM  Result Value Ref Range   Glucose-Capillary 123 (H) 70 - 99 mg/dL    Discharge Medications:   Allergies as of 03/09/2024   No Known Allergies      Medication List     STOP taking these medications    doxycycline  100 MG tablet Commonly known as: VIBRA -TABS   nitroGLYCERIN  0.2 mg/hr patch Commonly known as: NITRODUR - Dosed in mg/24 hr       TAKE these medications    acetaminophen  325 MG tablet Commonly known as: TYLENOL  Take  2 tablets (650 mg total) by mouth every 6 (six) hours as needed for mild pain (pain score 1-3) or fever (or Fever >/= 101).   levothyroxine  50 MCG tablet Commonly known as:  SYNTHROID  Take 50 mcg by mouth daily before breakfast.   metFORMIN  500 MG tablet Commonly known as: GLUCOPHAGE  Take 2 tablets (1,000 mg total) by mouth 2 (two) times daily with a meal.   oxyCODONE -acetaminophen  5-325 MG tablet Commonly known as: PERCOCET/ROXICET Take 1 tablet by mouth every 4 (four) hours as needed.               Discharge Care Instructions  (From admission, onward)           Start     Ordered   03/09/24 0000  Touch down weight bearing       Question Answer Comment  Laterality left   Extremity Lower      03/09/24 0823            Diagnostic Studies: No results found.  Disposition: Discharge disposition: 01-Home or Self Care       Discharge Instructions     Call MD / Call 911   Complete by: As directed    If you experience chest pain or shortness of breath, CALL 911 and be transported to the hospital emergency room.  If you develope a fever above 101 F, pus (white drainage) or increased drainage or redness at the wound, or calf pain, call your surgeon's office.   Constipation Prevention   Complete by: As directed    Drink plenty of fluids.  Prune juice may be helpful.  You may use a stool softener, such as Colace (over the counter) 100 mg twice a day.  Use MiraLax  (over the counter) for constipation as needed.   Diet - low sodium heart healthy   Complete by: As directed    Increase activity slowly as tolerated   Complete by: As directed    Post-operative opioid taper instructions:   Complete by: As directed    POST-OPERATIVE OPIOID TAPER INSTRUCTIONS: It is important to wean off of your opioid medication as soon as possible. If you do not need pain medication after your surgery it is ok to stop day one. Opioids include: Codeine, Hydrocodone(Norco, Vicodin), Oxycodone (Percocet, oxycontin ) and hydromorphone  amongst others.  Long term and even short term use of opiods can cause: Increased pain  response Dependence Constipation Depression Respiratory depression And more.  Withdrawal symptoms can include Flu like symptoms Nausea, vomiting And more Techniques to manage these symptoms Hydrate well Eat regular healthy meals Stay active Use relaxation techniques(deep breathing, meditating, yoga) Do Not substitute Alcohol to help with tapering If you have been on opioids for less than two weeks and do not have pain than it is ok to stop all together.  Plan to wean off of opioids This plan should start within one week post op of your joint replacement. Maintain the same interval or time between taking each dose and first decrease the dose.  Cut the total daily intake of opioids by one tablet each day Next start to increase the time between doses. The last dose that should be eliminated is the evening dose.      Touch down weight bearing   Complete by: As directed    Laterality: left   Extremity: Lower        Follow-up Information     Harden Jerona GAILS, MD Follow up in 1 week(s).   Specialty: Orthopedic Surgery  Contact information: [address]  [place] [address] [phone]                  Signed: Jerona LULLA Sage 03/09/2024, 8:23 AM

## 2024-03-09 NOTE — Progress Notes (Signed)
 Patient ID: Fred Mcintosh, male   DOB: 01-08-1961, 63 y.o.   MRN: [id number] Patient is postoperative day 1 left transmetatarsal amputation.  The dressing is clean and dry.  Plan for discharge to home.  Discussed patient may be weightbearing on the heel as needed however minimizing weightbearing is ideal.  Ambulation with a postoperative shoe.

## 2024-03-11 ENCOUNTER — Encounter (HOSPITAL_COMMUNITY): Payer: Self-pay | Admitting: Orthopedic Surgery

## 2024-03-15 ENCOUNTER — Ambulatory Visit (INDEPENDENT_AMBULATORY_CARE_PROVIDER_SITE_OTHER): Admitting: Family

## 2024-03-15 ENCOUNTER — Encounter: Payer: Self-pay | Admitting: Family

## 2024-03-15 DIAGNOSIS — T8131XA Disruption of external operation (surgical) wound, not elsewhere classified, initial encounter: Secondary | ICD-10-CM

## 2024-03-15 DIAGNOSIS — Z89432 Acquired absence of left foot: Secondary | ICD-10-CM

## 2024-03-15 NOTE — Progress Notes (Signed)
   Post-Op Visit Note   Patient: Fred Mcintosh           Date of Birth: 1961/03/04           MRN: [id number] Visit Date: 03/15/2024 PCP: [name], MD  Chief Complaint:  Chief Complaint  Patient presents with   Left Foot - Routine Post Op    03/08/24 left TMA    HPI:  HPI The patient is a 63 year old gentleman seen status post left transmetatarsal amputation August 22 did not have a wound VAC postoperatively.  He has been having some pain complains of tightness from the operative dressing Ortho Exam On examination r left foot the incisions approximated with sutures there is surrounding maceration and a foul odor there is no purulence no erythema no sign of infection  Visit Diagnoses: No diagnosis found.  Plan: Begin daily Dial soap cleansing.  Dry dressings.  Nonweightbearing.  Follow-up in office in 2 weeks for reevaluation.  Follow-Up Instructions: No follow-ups on file.   Imaging: No results found.  Orders:  No orders of the defined types were placed in this encounter.  No orders of the defined types were placed in this encounter.    PMFS History: Patient Active Problem List   Diagnosis Date Noted   Wound dehiscence, surgical, initial encounter 03/08/2024   Non-healing wound of amputation stump (HCC) 03/08/2024   New onset type 2 diabetes mellitus (HCC) 01/19/2024   Essential hypertension 01/19/2024   Obesity, Class III, BMI 40-49.9 (morbid obesity) 01/19/2024   Hyponatremia 01/19/2024   Sepsis (HCC) 01/19/2024   Cutaneous abscess of left foot 01/19/2024   Diabetic infection of left foot (HCC) 01/19/2024   Toe infection 01/18/2024   Anemia 07/18/2012   Acute respiratory failure with hypoxia (HCC) 07/18/2012   Community acquired pneumonia 07/13/2012   Hypokalemia 07/13/2012   Dehydration 07/13/2012   Thrombocytopenia (HCC) 07/13/2012   Past Medical History:  Diagnosis Date   Acute respiratory failure with hypoxia (HCC) 07/13/2012   Secondary to  multi-lobar bilateral pneumonia   Anemia    Asthma    Community acquired pneumonia 07/13/2012   Bilateral/multi-lobar   Diabetes mellitus without complication (HCC)    type 2   Graves disease    Hypertension     No family history on file.  Past Surgical History:  Procedure Laterality Date   AMPUTATION Left 03/08/2024   Procedure: LEFT TRANSMETATARSAL AMPUTATION;  Surgeon: Harden Jerona GAILS, MD;  Location: [facility] OR;  Service: Orthopedics;  Laterality: Left;   AMPUTATION TOE Left 01/20/2024   Procedure: AMPUTATION, TOE;  Surgeon: Harden Jerona GAILS, MD;  Location: [facility] OR;  Service: Orthopedics;  Laterality: Left;  LEFT FOOT 2ND RAY   Social History   Occupational History   Not on file  Tobacco Use   Smoking status: Never    Passive exposure: Never   Smokeless tobacco: Not on file  Vaping Use   Vaping status: Never Used  Substance and Sexual Activity   Alcohol use: No   Drug use: No   Sexual activity: Not on file

## 2024-03-27 ENCOUNTER — Encounter (HOSPITAL_COMMUNITY)
Admission: EM | Disposition: A | Discharge status: Skilled Nursing Facility | Payer: Self-pay | Source: Home / Self Care | Attending: Internal Medicine

## 2024-03-27 ENCOUNTER — Emergency Department (HOSPITAL_COMMUNITY)

## 2024-03-27 ENCOUNTER — Inpatient Hospital Stay (HOSPITAL_COMMUNITY): Admitting: Anesthesiology

## 2024-03-27 ENCOUNTER — Other Ambulatory Visit: Payer: Self-pay

## 2024-03-27 ENCOUNTER — Inpatient Hospital Stay (HOSPITAL_COMMUNITY)
Admission: EM | Admit: 2024-03-27 | Discharge: 2024-05-01 | DRG: 853 | Disposition: A | Discharge status: Skilled Nursing Facility | Attending: Internal Medicine | Admitting: Internal Medicine

## 2024-03-27 ENCOUNTER — Encounter (HOSPITAL_COMMUNITY): Payer: Self-pay | Admitting: Emergency Medicine

## 2024-03-27 ENCOUNTER — Inpatient Hospital Stay (HOSPITAL_COMMUNITY)

## 2024-03-27 DIAGNOSIS — Z89512 Acquired absence of left leg below knee: Secondary | ICD-10-CM

## 2024-03-27 DIAGNOSIS — I358 Other nonrheumatic aortic valve disorders: Secondary | ICD-10-CM | POA: Diagnosis present

## 2024-03-27 DIAGNOSIS — E8729 Other acidosis: Secondary | ICD-10-CM

## 2024-03-27 DIAGNOSIS — I959 Hypotension, unspecified: Secondary | ICD-10-CM | POA: Diagnosis not present

## 2024-03-27 DIAGNOSIS — L89153 Pressure ulcer of sacral region, stage 3: Secondary | ICD-10-CM | POA: Diagnosis present

## 2024-03-27 DIAGNOSIS — E039 Hypothyroidism, unspecified: Secondary | ICD-10-CM | POA: Diagnosis present

## 2024-03-27 DIAGNOSIS — Z79890 Hormone replacement therapy (postmenopausal): Secondary | ICD-10-CM

## 2024-03-27 DIAGNOSIS — R739 Hyperglycemia, unspecified: Secondary | ICD-10-CM

## 2024-03-27 DIAGNOSIS — Z6833 Body mass index (BMI) 33.0-33.9, adult: Secondary | ICD-10-CM

## 2024-03-27 DIAGNOSIS — Z91199 Patient's noncompliance with other medical treatment and regimen due to unspecified reason: Secondary | ICD-10-CM

## 2024-03-27 DIAGNOSIS — E872 Acidosis, unspecified: Secondary | ICD-10-CM | POA: Diagnosis present

## 2024-03-27 DIAGNOSIS — J9601 Acute respiratory failure with hypoxia: Secondary | ICD-10-CM | POA: Diagnosis not present

## 2024-03-27 DIAGNOSIS — J45909 Unspecified asthma, uncomplicated: Secondary | ICD-10-CM | POA: Diagnosis present

## 2024-03-27 DIAGNOSIS — E66811 Obesity, class 1: Secondary | ICD-10-CM | POA: Diagnosis present

## 2024-03-27 DIAGNOSIS — M726 Necrotizing fasciitis: Secondary | ICD-10-CM | POA: Diagnosis present

## 2024-03-27 DIAGNOSIS — E0500 Thyrotoxicosis with diffuse goiter without thyrotoxic crisis or storm: Secondary | ICD-10-CM | POA: Diagnosis present

## 2024-03-27 DIAGNOSIS — E860 Dehydration: Secondary | ICD-10-CM | POA: Diagnosis present

## 2024-03-27 DIAGNOSIS — E1100 Type 2 diabetes mellitus with hyperosmolarity without nonketotic hyperglycemic-hyperosmolar coma (NKHHC): Secondary | ICD-10-CM | POA: Diagnosis present

## 2024-03-27 DIAGNOSIS — E11628 Type 2 diabetes mellitus with other skin complications: Secondary | ICD-10-CM | POA: Diagnosis not present

## 2024-03-27 DIAGNOSIS — R4182 Altered mental status, unspecified: Secondary | ICD-10-CM | POA: Diagnosis present

## 2024-03-27 DIAGNOSIS — Z7984 Long term (current) use of oral hypoglycemic drugs: Secondary | ICD-10-CM | POA: Diagnosis not present

## 2024-03-27 DIAGNOSIS — E870 Hyperosmolality and hypernatremia: Secondary | ICD-10-CM | POA: Diagnosis present

## 2024-03-27 DIAGNOSIS — R410 Disorientation, unspecified: Secondary | ICD-10-CM | POA: Diagnosis not present

## 2024-03-27 DIAGNOSIS — I38 Endocarditis, valve unspecified: Secondary | ICD-10-CM | POA: Diagnosis not present

## 2024-03-27 DIAGNOSIS — G9341 Metabolic encephalopathy: Secondary | ICD-10-CM | POA: Diagnosis present

## 2024-03-27 DIAGNOSIS — A491 Streptococcal infection, unspecified site: Secondary | ICD-10-CM | POA: Diagnosis not present

## 2024-03-27 DIAGNOSIS — B958 Unspecified staphylococcus as the cause of diseases classified elsewhere: Secondary | ICD-10-CM | POA: Diagnosis not present

## 2024-03-27 DIAGNOSIS — N179 Acute kidney failure, unspecified: Secondary | ICD-10-CM

## 2024-03-27 DIAGNOSIS — D62 Acute posthemorrhagic anemia: Secondary | ICD-10-CM | POA: Diagnosis present

## 2024-03-27 DIAGNOSIS — A409 Streptococcal sepsis, unspecified: Secondary | ICD-10-CM | POA: Diagnosis present

## 2024-03-27 DIAGNOSIS — Z794 Long term (current) use of insulin: Secondary | ICD-10-CM

## 2024-03-27 DIAGNOSIS — E1165 Type 2 diabetes mellitus with hyperglycemia: Secondary | ICD-10-CM | POA: Diagnosis not present

## 2024-03-27 DIAGNOSIS — E871 Hypo-osmolality and hyponatremia: Secondary | ICD-10-CM | POA: Diagnosis present

## 2024-03-27 DIAGNOSIS — B955 Unspecified streptococcus as the cause of diseases classified elsewhere: Secondary | ICD-10-CM

## 2024-03-27 DIAGNOSIS — N170 Acute kidney failure with tubular necrosis: Secondary | ICD-10-CM | POA: Diagnosis present

## 2024-03-27 DIAGNOSIS — Z7401 Bed confinement status: Secondary | ICD-10-CM

## 2024-03-27 DIAGNOSIS — I480 Paroxysmal atrial fibrillation: Secondary | ICD-10-CM | POA: Diagnosis present

## 2024-03-27 DIAGNOSIS — E875 Hyperkalemia: Secondary | ICD-10-CM | POA: Diagnosis present

## 2024-03-27 DIAGNOSIS — R1310 Dysphagia, unspecified: Secondary | ICD-10-CM | POA: Diagnosis present

## 2024-03-27 DIAGNOSIS — A419 Sepsis, unspecified organism: Secondary | ICD-10-CM

## 2024-03-27 DIAGNOSIS — E66813 Obesity, class 3: Secondary | ICD-10-CM | POA: Diagnosis not present

## 2024-03-27 DIAGNOSIS — R6521 Severe sepsis with septic shock: Secondary | ICD-10-CM

## 2024-03-27 DIAGNOSIS — E119 Type 2 diabetes mellitus without complications: Secondary | ICD-10-CM | POA: Diagnosis not present

## 2024-03-27 DIAGNOSIS — I10 Essential (primary) hypertension: Secondary | ICD-10-CM | POA: Diagnosis present

## 2024-03-27 DIAGNOSIS — R7881 Bacteremia: Secondary | ICD-10-CM | POA: Diagnosis not present

## 2024-03-27 DIAGNOSIS — R0689 Other abnormalities of breathing: Secondary | ICD-10-CM | POA: Diagnosis not present

## 2024-03-27 DIAGNOSIS — Z743 Need for continuous supervision: Secondary | ICD-10-CM | POA: Diagnosis not present

## 2024-03-27 HISTORY — PX: AMPUTATION: SHX166

## 2024-03-27 LAB — I-STAT CHEM 8, ED
BUN: 96 mg/dL — ABNORMAL HIGH (ref 8–23)
Calcium, Ion: 0.99 mmol/L — ABNORMAL LOW (ref 1.15–1.40)
Chloride: 95 mmol/L — ABNORMAL LOW (ref 98–111)
Creatinine, Ser: 2.5 mg/dL — ABNORMAL HIGH (ref 0.61–1.24)
Glucose, Bld: >700 mg/dL (ref 70–99)
HCT: 30 % — ABNORMAL LOW (ref 39.0–52.0)
Hemoglobin: 10.2 g/dL — ABNORMAL LOW (ref 13.0–17.0)
Potassium: 6.4 mmol/L (ref 3.5–5.1)
Sodium: 125 mmol/L — ABNORMAL LOW (ref 135–145)
TCO2: 18 mmol/L — ABNORMAL LOW (ref 22–32)

## 2024-03-27 LAB — BETA-HYDROXYBUTYRIC ACID: Beta-Hydroxybutyric Acid: 3.71 mmol/L — ABNORMAL HIGH (ref 0.05–0.27)

## 2024-03-27 LAB — GLUCOSE, CAPILLARY
Glucose-Capillary: >600 mg/dL (ref 70–99)
Glucose-Capillary: 570 mg/dL (ref 70–99)
Glucose-Capillary: 576 mg/dL (ref 70–99)
Glucose-Capillary: 577 mg/dL (ref 70–99)
Glucose-Capillary: 587 mg/dL (ref 70–99)
Glucose-Capillary: 528 mg/dL (ref 70–99)
Glucose-Capillary: 569 mg/dL (ref 70–99)
Glucose-Capillary: 435 mg/dL — ABNORMAL HIGH (ref 70–99)
Glucose-Capillary: 371 mg/dL — ABNORMAL HIGH (ref 70–99)
Glucose-Capillary: 436 mg/dL — ABNORMAL HIGH (ref 70–99)
Glucose-Capillary: 378 mg/dL — ABNORMAL HIGH (ref 70–99)
Glucose-Capillary: 304 mg/dL — ABNORMAL HIGH (ref 70–99)
Glucose-Capillary: 299 mg/dL — ABNORMAL HIGH (ref 70–99)
Glucose-Capillary: 246 mg/dL — ABNORMAL HIGH (ref 70–99)
Glucose-Capillary: 227 mg/dL — ABNORMAL HIGH (ref 70–99)
Glucose-Capillary: 222 mg/dL — ABNORMAL HIGH (ref 70–99)

## 2024-03-27 LAB — T4, FREE: Free T4: 0.67 ng/dL (ref 0.61–1.12)

## 2024-03-27 LAB — CBC WITH DIFFERENTIAL/PLATELET
Basophils Absolute: 0 K/uL (ref 0.0–0.1)
Basophils Relative: 0 %
Eosinophils Absolute: 0 K/uL (ref 0.0–0.5)
Eosinophils Relative: 0 %
HCT: 28.1 % — ABNORMAL LOW (ref 39.0–52.0)
Hemoglobin: 9 g/dL — ABNORMAL LOW (ref 13.0–17.0)
Lymphocytes Relative: 9 %
Lymphs Abs: 2.8 K/uL (ref 0.7–4.0)
MCH: 27.2 pg (ref 26.0–34.0)
MCHC: 32 g/dL (ref 30.0–36.0)
MCV: 84.9 fL (ref 80.0–100.0)
Monocytes Absolute: 0.3 K/uL (ref 0.1–1.0)
Monocytes Relative: 1 %
Neutro Abs: 28.3 K/uL — ABNORMAL HIGH (ref 1.7–7.7)
Neutrophils Relative %: 90 %
Platelets: 440 K/uL — ABNORMAL HIGH (ref 150–400)
RBC: 3.31 MIL/uL — ABNORMAL LOW (ref 4.22–5.81)
RDW: 18.2 % — ABNORMAL HIGH (ref 11.5–15.5)
Smear Review: NORMAL
WBC: 31.4 K/uL — ABNORMAL HIGH (ref 4.0–10.5)
nRBC: 0.4 % — ABNORMAL HIGH (ref 0.0–0.2)

## 2024-03-27 LAB — CBG MONITORING, ED
Glucose-Capillary: >600 mg/dL (ref 70–99)
Glucose-Capillary: >600 mg/dL (ref 70–99)
Glucose-Capillary: >600 mg/dL (ref 70–99)

## 2024-03-27 LAB — HEMOGLOBIN A1C
Hgb A1c MFr Bld: 6.7 % — ABNORMAL HIGH (ref 4.8–5.6)
Mean Plasma Glucose: 145.59 mg/dL

## 2024-03-27 LAB — LACTIC ACID, PLASMA
Lactic Acid, Venous: 5.4 mmol/L (ref 0.5–1.9)
Lactic Acid, Venous: 6.3 mmol/L (ref 0.5–1.9)
Lactic Acid, Venous: 7.6 mmol/L (ref 0.5–1.9)
Lactic Acid, Venous: 7.6 mmol/L (ref 0.5–1.9)

## 2024-03-27 LAB — HEPATIC FUNCTION PANEL
ALT: 13 U/L (ref 0–44)
AST: 32 U/L (ref 15–41)
Albumin: <1.5 g/dL — ABNORMAL LOW (ref 3.5–5.0)
Alkaline Phosphatase: 94 U/L (ref 38–126)
Bilirubin, Direct: 2.3 mg/dL — ABNORMAL HIGH (ref 0.0–0.2)
Indirect Bilirubin: 1.2 mg/dL — ABNORMAL HIGH (ref 0.3–0.9)
Total Bilirubin: 3.5 mg/dL — ABNORMAL HIGH (ref 0.0–1.2)
Total Protein: 6.2 g/dL — ABNORMAL LOW (ref 6.5–8.1)

## 2024-03-27 LAB — POCT I-STAT 7, (LYTES, BLD GAS, ICA,H+H)
Acid-Base Excess: 1 mmol/L (ref 0.0–2.0)
Bicarbonate: 22.8 mmol/L (ref 20.0–28.0)
Calcium, Ion: 1.06 mmol/L — ABNORMAL LOW (ref 1.15–1.40)
HCT: 23 % — ABNORMAL LOW (ref 39.0–52.0)
Hemoglobin: 7.8 g/dL — ABNORMAL LOW (ref 13.0–17.0)
O2 Saturation: 99 %
Patient temperature: 98.8
Potassium: 3.5 mmol/L (ref 3.5–5.1)
Sodium: 135 mmol/L (ref 135–145)
TCO2: 24 mmol/L (ref 22–32)
pCO2 arterial: 25.9 mmHg — ABNORMAL LOW (ref 32–48)
pH, Arterial: 7.552 — ABNORMAL HIGH (ref 7.35–7.45)
pO2, Arterial: 124 mmHg — ABNORMAL HIGH (ref 83–108)

## 2024-03-27 LAB — TYPE AND SCREEN
ABO/RH(D): AB POS
Antibody Screen: NEGATIVE

## 2024-03-27 LAB — URINALYSIS, W/ REFLEX TO CULTURE (INFECTION SUSPECTED)
Bilirubin Urine: NEGATIVE
Glucose, UA: >=500 mg/dL — AB
Ketones, ur: NEGATIVE mg/dL
Nitrite: NEGATIVE
Protein, ur: NEGATIVE mg/dL
Specific Gravity, Urine: 1.016 (ref 1.005–1.030)
pH: 5 (ref 5.0–8.0)

## 2024-03-27 LAB — COMPREHENSIVE METABOLIC PANEL WITH GFR
ALT: 18 U/L (ref 0–44)
AST: 25 U/L (ref 15–41)
Albumin: 1.7 g/dL — ABNORMAL LOW (ref 3.5–5.0)
Alkaline Phosphatase: 127 U/L — ABNORMAL HIGH (ref 38–126)
Anion gap: 23 — ABNORMAL HIGH (ref 5–15)
BUN: 101 mg/dL — ABNORMAL HIGH (ref 8–23)
CO2: 16 mmol/L — ABNORMAL LOW (ref 22–32)
Calcium: 8.2 mg/dL — ABNORMAL LOW (ref 8.9–10.3)
Chloride: 89 mmol/L — ABNORMAL LOW (ref 98–111)
Creatinine, Ser: 2.57 mg/dL — ABNORMAL HIGH (ref 0.61–1.24)
GFR, Estimated: 27 mL/min — ABNORMAL LOW (ref >60)
Glucose, Bld: 816 mg/dL (ref 70–99)
Potassium: 5.9 mmol/L — ABNORMAL HIGH (ref 3.5–5.1)
Sodium: 128 mmol/L — ABNORMAL LOW (ref 135–145)
Total Bilirubin: 3.3 mg/dL — ABNORMAL HIGH (ref 0.0–1.2)
Total Protein: 7.4 g/dL (ref 6.5–8.1)

## 2024-03-27 LAB — BASIC METABOLIC PANEL WITH GFR
Anion gap: 17 — ABNORMAL HIGH (ref 5–15)
BUN: 110 mg/dL — ABNORMAL HIGH (ref 8–23)
CO2: 18 mmol/L — ABNORMAL LOW (ref 22–32)
Calcium: 8.2 mg/dL — ABNORMAL LOW (ref 8.9–10.3)
Chloride: 94 mmol/L — ABNORMAL LOW (ref 98–111)
Creatinine, Ser: 2.6 mg/dL — ABNORMAL HIGH (ref 0.61–1.24)
GFR, Estimated: 27 mL/min — ABNORMAL LOW (ref >60)
Glucose, Bld: 715 mg/dL (ref 70–99)
Potassium: 4.2 mmol/L (ref 3.5–5.1)
Sodium: 129 mmol/L — ABNORMAL LOW (ref 135–145)

## 2024-03-27 LAB — BLOOD GAS, VENOUS
Acid-base deficit: 7.1 mmol/L — ABNORMAL HIGH (ref 0.0–2.0)
Bicarbonate: 16.6 mmol/L — ABNORMAL LOW (ref 20.0–28.0)
Drawn by: 4237
O2 Saturation: 65 %
Patient temperature: 36.4
pCO2, Ven: 27 mmHg — ABNORMAL LOW (ref 44–60)
pH, Ven: 7.39 (ref 7.25–7.43)
pO2, Ven: 41 mmHg (ref 32–45)

## 2024-03-27 LAB — PROTIME-INR
INR: 1.5 — ABNORMAL HIGH (ref 0.8–1.2)
Prothrombin Time: 19.3 s — ABNORMAL HIGH (ref 11.4–15.2)

## 2024-03-27 LAB — OSMOLALITY: Osmolality: 362 mosm/kg (ref 275–295)

## 2024-03-27 LAB — POC OCCULT BLOOD, ED: Fecal Occult Bld: NEGATIVE

## 2024-03-27 LAB — AMMONIA: Ammonia: 27 umol/L (ref 9–35)

## 2024-03-27 LAB — MAGNESIUM: Magnesium: 2.8 mg/dL — ABNORMAL HIGH (ref 1.7–2.4)

## 2024-03-27 LAB — MRSA NEXT GEN BY PCR, NASAL: MRSA by PCR Next Gen: NOT DETECTED

## 2024-03-27 LAB — PREPARE RBC (CROSSMATCH)

## 2024-03-27 LAB — C-REACTIVE PROTEIN: CRP: 37.6 mg/dL — ABNORMAL HIGH (ref <1.0)

## 2024-03-27 LAB — TSH
TSH: 6.747 u[IU]/mL — ABNORMAL HIGH (ref 0.350–4.500)
TSH: 7.797 u[IU]/mL — ABNORMAL HIGH (ref 0.350–4.500)

## 2024-03-27 LAB — CK: Total CK: 59 U/L (ref 49–397)

## 2024-03-27 SURGERY — AMPUTATION, ABOVE KNEE
Anesthesia: General | Site: Knee | Laterality: Left

## 2024-03-27 MED ORDER — PROPOFOL 10 MG/ML IV BOLUS
INTRAVENOUS | Status: AC
  Filled 2024-03-27: qty 20

## 2024-03-27 MED ORDER — ORAL CARE MOUTH RINSE
15.0000 mL | OROMUCOSAL | Status: DC
  Administered 2024-03-27 – 2024-03-28 (×11): 15 mL via OROMUCOSAL

## 2024-03-27 MED ORDER — ORAL CARE MOUTH RINSE
15.0000 mL | OROMUCOSAL | Status: DC
  Administered 2024-03-27: 15 mL via OROMUCOSAL

## 2024-03-27 MED ORDER — SODIUM CHLORIDE 0.9 % IV SOLN: INTRAVENOUS | Status: DC | PRN

## 2024-03-27 MED ORDER — PHENYLEPHRINE HCL-NACL 20-0.9 MG/250ML-% IV SOLN
INTRAVENOUS | Status: AC
  Filled 2024-03-27: qty 250

## 2024-03-27 MED ORDER — ONDANSETRON HCL 4 MG/2ML IJ SOLN
INTRAMUSCULAR | Status: AC
  Filled 2024-03-27: qty 2

## 2024-03-27 MED ORDER — FENTANYL CITRATE (PF) 250 MCG/5ML IJ SOLN
INTRAMUSCULAR | Status: AC
  Filled 2024-03-27: qty 5

## 2024-03-27 MED ORDER — ALBUTEROL SULFATE (2.5 MG/3ML) 0.083% IN NEBU: 2.5000 mg | INHALATION_SOLUTION | Freq: Four times a day (QID) | RESPIRATORY_TRACT | Status: DC | PRN

## 2024-03-27 MED ORDER — INSULIN REGULAR(HUMAN) IN NACL 100-0.9 UT/100ML-% IV SOLN
INTRAVENOUS | Status: DC
  Administered 2024-03-28: 8 [IU]/h via INTRAVENOUS
  Administered 2024-03-29: 4 [IU]/h via INTRAVENOUS
  Administered 2024-03-29: 10 [IU]/h via INTRAVENOUS
  Administered 2024-03-30: 5 [IU]/h via INTRAVENOUS
  Administered 2024-03-31: 4.2 [IU]/h via INTRAVENOUS
  Filled 2024-03-27 (×5): qty 100

## 2024-03-27 MED ORDER — HEPARIN SODIUM (PORCINE) 5000 UNIT/ML IJ SOLN
5000.0000 [IU] | Freq: Three times a day (TID) | INTRAMUSCULAR | Status: DC
  Administered 2024-03-27 – 2024-05-01 (×105): 5000 [IU] via SUBCUTANEOUS
  Filled 2024-03-27 (×105): qty 1

## 2024-03-27 MED ORDER — DOCUSATE SODIUM 100 MG PO CAPS: 100.0000 mg | ORAL_CAPSULE | Freq: Two times a day (BID) | ORAL | Status: DC | PRN

## 2024-03-27 MED ORDER — LIDOCAINE 2% (20 MG/ML) 5 ML SYRINGE
INTRAMUSCULAR | Status: AC
  Filled 2024-03-27: qty 5

## 2024-03-27 MED ORDER — NOREPINEPHRINE 4 MG/250ML-% IV SOLN
0.0000 ug/min | INTRAVENOUS | Status: DC
  Administered 2024-03-27: 2 ug/min via INTRAVENOUS
  Filled 2024-03-27 (×2): qty 250

## 2024-03-27 MED ORDER — LINEZOLID 600 MG/300ML IV SOLN
600.0000 mg | Freq: Two times a day (BID) | INTRAVENOUS | Status: DC
  Administered 2024-03-27 – 2024-03-29 (×5): 600 mg via INTRAVENOUS
  Filled 2024-03-27 (×5): qty 300

## 2024-03-27 MED ORDER — POVIDONE-IODINE 10 % EX SWAB: 2.0000 | Freq: Once | CUTANEOUS | Status: DC

## 2024-03-27 MED ORDER — ORAL CARE MOUTH RINSE: 15.0000 mL | OROMUCOSAL | Status: DC | PRN

## 2024-03-27 MED ORDER — LINEZOLID 600 MG/300ML IV SOLN
600.0000 mg | Freq: Once | INTRAVENOUS | Status: AC
  Administered 2024-03-27: 600 mg via INTRAVENOUS
  Filled 2024-03-27: qty 300

## 2024-03-27 MED ORDER — SODIUM BICARBONATE 8.4 % IV SOLN: 200.0000 meq | Freq: Once | INTRAVENOUS | Status: AC

## 2024-03-27 MED ORDER — POLYETHYLENE GLYCOL 3350 17 G PO PACK
17.0000 g | PACK | Freq: Every day | ORAL | Status: DC
  Administered 2024-03-27 – 2024-04-01 (×4): 17 g
  Filled 2024-03-27 (×5): qty 1

## 2024-03-27 MED ORDER — LACTATED RINGERS IV SOLN: INTRAVENOUS | Status: AC

## 2024-03-27 MED ORDER — AMIODARONE HCL IN DEXTROSE 360-4.14 MG/200ML-% IV SOLN
INTRAVENOUS | Status: AC
  Administered 2024-03-27: 60 mg/h via INTRAVENOUS
  Filled 2024-03-27: qty 200

## 2024-03-27 MED ORDER — FAMOTIDINE 20 MG PO TABS
20.0000 mg | ORAL_TABLET | Freq: Every day | ORAL | Status: DC
  Administered 2024-03-27: 20 mg
  Filled 2024-03-27: qty 1

## 2024-03-27 MED ORDER — ROCURONIUM BROMIDE 10 MG/ML (PF) SYRINGE
PREFILLED_SYRINGE | INTRAVENOUS | Status: AC
  Filled 2024-03-27: qty 10

## 2024-03-27 MED ORDER — SODIUM BICARBONATE 8.4 % IV SOLN
INTRAVENOUS | Status: AC
  Administered 2024-03-27: 200 meq via INTRAVENOUS
  Filled 2024-03-27: qty 200

## 2024-03-27 MED ORDER — DEXTROSE 50 % IV SOLN: 0.0000 mL | INTRAVENOUS | Status: DC | PRN

## 2024-03-27 MED ORDER — ONDANSETRON HCL 4 MG/2ML IJ SOLN
4.0000 mg | Freq: Four times a day (QID) | INTRAMUSCULAR | Status: DC | PRN
  Administered 2024-03-31 – 2024-04-10 (×2): 4 mg via INTRAVENOUS
  Filled 2024-03-27 (×2): qty 2

## 2024-03-27 MED ORDER — PROPOFOL 10 MG/ML IV BOLUS
INTRAVENOUS | Status: DC | PRN
  Administered 2024-03-27: 60 ug/kg/min via INTRAVENOUS

## 2024-03-27 MED ORDER — 0.9 % SODIUM CHLORIDE (POUR BTL) OPTIME: TOPICAL | Status: DC | PRN

## 2024-03-27 MED ORDER — CHLORHEXIDINE GLUCONATE 4 % EX SOLN
60.0000 mL | Freq: Once | CUTANEOUS | Status: DC
  Filled 2024-03-27: qty 60

## 2024-03-27 MED ORDER — AMIODARONE HCL IN DEXTROSE 360-4.14 MG/200ML-% IV SOLN
60.0000 mg/h | INTRAVENOUS | Status: DC
  Administered 2024-03-27: 60 mg/h via INTRAVENOUS
  Filled 2024-03-27: qty 200

## 2024-03-27 MED ORDER — FENTANYL CITRATE PF 50 MCG/ML IJ SOSY
50.0000 ug | PREFILLED_SYRINGE | INTRAMUSCULAR | Status: DC | PRN
  Administered 2024-03-27: 50 ug via INTRAVENOUS
  Filled 2024-03-27: qty 1

## 2024-03-27 MED ORDER — CEFAZOLIN SODIUM-DEXTROSE 3-4 GM/150ML-% IV SOLN: 3.0000 g | INTRAVENOUS | Status: DC

## 2024-03-27 MED ORDER — LACTATED RINGERS IV SOLN: INTRAVENOUS | Status: DC

## 2024-03-27 MED ORDER — FENTANYL CITRATE PF 50 MCG/ML IJ SOSY
50.0000 ug | PREFILLED_SYRINGE | INTRAMUSCULAR | Status: DC | PRN
  Administered 2024-03-27 – 2024-03-29 (×3): 100 ug via INTRAVENOUS
  Filled 2024-03-27 (×3): qty 2

## 2024-03-27 MED ORDER — VITAMIN C 500 MG PO TABS
1000.0000 mg | ORAL_TABLET | Freq: Every day | ORAL | Status: DC
  Administered 2024-03-28: 1000 mg via ORAL
  Filled 2024-03-27: qty 2

## 2024-03-27 MED ORDER — ROCURONIUM BROMIDE 10 MG/ML (PF) SYRINGE
PREFILLED_SYRINGE | INTRAVENOUS | Status: DC | PRN
  Administered 2024-03-27: 100 mg via INTRAVENOUS

## 2024-03-27 MED ORDER — MEDIHONEY WOUND/BURN DRESSING EX PSTE
1.0000 | PASTE | Freq: Every day | CUTANEOUS | Status: DC
  Administered 2024-03-28 – 2024-04-13 (×11): 1 via TOPICAL
  Filled 2024-03-27 (×4): qty 44

## 2024-03-27 MED ORDER — NOREPINEPHRINE 4 MG/250ML-% IV SOLN
INTRAVENOUS | Status: AC
  Filled 2024-03-27: qty 250

## 2024-03-27 MED ORDER — SODIUM CHLORIDE 0.9 % IV BOLUS
1000.0000 mL | Freq: Once | INTRAVENOUS | Status: AC
  Administered 2024-03-27: 1000 mL via INTRAVENOUS

## 2024-03-27 MED ORDER — SODIUM CHLORIDE 0.9 % IV SOLN
250.0000 mL | INTRAVENOUS | Status: AC
  Administered 2024-03-27: 250 mL via INTRAVENOUS

## 2024-03-27 MED ORDER — CHLORHEXIDINE GLUCONATE CLOTH 2 % EX PADS
6.0000 | MEDICATED_PAD | Freq: Every day | CUTANEOUS | Status: DC
  Administered 2024-03-27 – 2024-04-13 (×17): 6 via TOPICAL

## 2024-03-27 MED ORDER — LACTATED RINGERS IV BOLUS
1000.0000 mL | Freq: Once | INTRAVENOUS | Status: AC
  Administered 2024-03-27: 1000 mL via INTRAVENOUS

## 2024-03-27 MED ORDER — AMIODARONE LOAD VIA INFUSION
150.0000 mg | Freq: Once | INTRAVENOUS | Status: AC
  Administered 2024-03-27: 150 mg via INTRAVENOUS
  Filled 2024-03-27: qty 83.34

## 2024-03-27 MED ORDER — ONDANSETRON HCL 4 MG/2ML IJ SOLN
INTRAMUSCULAR | Status: AC
Start: 2024-03-27 — End: 2024-03-27
  Filled 2024-03-27: qty 2

## 2024-03-27 MED ORDER — ATROPINE SULFATE 0.4 MG/ML IV SOLN
INTRAVENOUS | Status: AC
  Filled 2024-03-27: qty 1

## 2024-03-27 MED ORDER — PIPERACILLIN-TAZOBACTAM 3.375 G IVPB 30 MIN
3.3750 g | Freq: Three times a day (TID) | INTRAVENOUS | Status: DC
  Administered 2024-03-27 – 2024-03-29 (×6): 3.375 g via INTRAVENOUS
  Filled 2024-03-27 (×6): qty 50

## 2024-03-27 MED ORDER — PANTOPRAZOLE SODIUM 40 MG IV SOLR
80.0000 mg | INTRAVENOUS | Status: AC
  Administered 2024-03-27: 80 mg via INTRAVENOUS
  Filled 2024-03-27: qty 20

## 2024-03-27 MED ORDER — JUVEN PO PACK
1.0000 | PACK | Freq: Two times a day (BID) | ORAL | Status: DC
  Administered 2024-03-28 – 2024-03-31 (×6): 1 via ORAL
  Filled 2024-03-27 (×6): qty 1

## 2024-03-27 MED ORDER — INSULIN REGULAR(HUMAN) IN NACL 100-0.9 UT/100ML-% IV SOLN
INTRAVENOUS | Status: DC
  Administered 2024-03-27: 29 [IU]/h via INTRAVENOUS
  Administered 2024-03-27: 28 [IU]/h via INTRAVENOUS
  Administered 2024-03-27: 11.5 [IU]/h via INTRAVENOUS
  Administered 2024-03-27: 16 [IU]/h via INTRAVENOUS
  Filled 2024-03-27 (×4): qty 100

## 2024-03-27 MED ORDER — INSULIN REGULAR(HUMAN) IN NACL 100-0.9 UT/100ML-% IV SOLN: INTRAVENOUS | Status: DC

## 2024-03-27 MED ORDER — CALCIUM GLUCONATE-NACL 2-0.675 GM/100ML-% IV SOLN
2.0000 g | Freq: Once | INTRAVENOUS | Status: AC
  Administered 2024-03-27: 2000 mg via INTRAVENOUS
  Filled 2024-03-27: qty 100

## 2024-03-27 MED ORDER — ONDANSETRON HCL 4 MG/2ML IJ SOLN
4.0000 mg | Freq: Once | INTRAMUSCULAR | Status: AC
  Administered 2024-03-27: 4 mg via INTRAVENOUS

## 2024-03-27 MED ORDER — SODIUM CHLORIDE 0.9% IV SOLUTION: Freq: Once | INTRAVENOUS | Status: DC

## 2024-03-27 MED ORDER — LACTATED RINGERS IV BOLUS (SEPSIS)
2000.0000 mL | Freq: Once | INTRAVENOUS | Status: AC
  Administered 2024-03-27: 2000 mL via INTRAVENOUS

## 2024-03-27 MED ORDER — FENTANYL CITRATE (PF) 250 MCG/5ML IJ SOLN
INTRAMUSCULAR | Status: DC | PRN
  Administered 2024-03-27: 100 ug via INTRAVENOUS

## 2024-03-27 MED ORDER — AMIODARONE HCL IN DEXTROSE 360-4.14 MG/200ML-% IV SOLN
30.0000 mg/h | INTRAVENOUS | Status: DC
  Administered 2024-03-28 – 2024-03-29 (×3): 30 mg/h via INTRAVENOUS
  Filled 2024-03-27 (×3): qty 200

## 2024-03-27 MED ORDER — DOCUSATE SODIUM 50 MG/5ML PO LIQD
100.0000 mg | Freq: Two times a day (BID) | ORAL | Status: DC
  Administered 2024-03-27 – 2024-04-01 (×9): 100 mg
  Filled 2024-03-27 (×12): qty 10

## 2024-03-27 MED ORDER — PANTOPRAZOLE SODIUM 40 MG IV SOLR
40.0000 mg | Freq: Two times a day (BID) | INTRAVENOUS | Status: DC
  Administered 2024-03-27 – 2024-03-28 (×2): 40 mg via INTRAVENOUS
  Filled 2024-03-27 (×3): qty 10

## 2024-03-27 MED ORDER — PROPOFOL 1000 MG/100ML IV EMUL
0.0000 ug/kg/min | INTRAVENOUS | Status: DC
  Administered 2024-03-27: 30 ug/kg/min via INTRAVENOUS
  Administered 2024-03-28: 35 ug/kg/min via INTRAVENOUS
  Administered 2024-03-28 (×3): 40 ug/kg/min via INTRAVENOUS
  Filled 2024-03-27 (×5): qty 100

## 2024-03-27 MED ORDER — DEXTROSE IN LACTATED RINGERS 5 % IV SOLN: INTRAVENOUS | Status: AC

## 2024-03-27 MED ORDER — AMIODARONE IV BOLUS ONLY 150 MG/100ML
INTRAVENOUS | Status: AC
  Filled 2024-03-27: qty 100

## 2024-03-27 MED ORDER — ARTIFICIAL TEARS OPHTHALMIC OINT
TOPICAL_OINTMENT | OPHTHALMIC | Status: AC
  Filled 2024-03-27: qty 3.5

## 2024-03-27 MED ORDER — ZINC SULFATE 220 (50 ZN) MG PO CAPS
220.0000 mg | ORAL_CAPSULE | Freq: Every day | ORAL | Status: DC
  Administered 2024-03-28: 220 mg via ORAL
  Filled 2024-03-27 (×2): qty 1

## 2024-03-27 MED ORDER — CALCIUM GLUCONATE-NACL 1-0.675 GM/50ML-% IV SOLN
1.0000 g | Freq: Once | INTRAVENOUS | Status: AC
Start: 2024-03-27 — End: 2024-03-27
  Administered 2024-03-27: 1000 mg via INTRAVENOUS
  Filled 2024-03-27: qty 50

## 2024-03-27 MED ORDER — POLYETHYLENE GLYCOL 3350 17 G PO PACK: 17.0000 g | PACK | Freq: Every day | ORAL | Status: DC | PRN

## 2024-03-27 MED ORDER — MIDAZOLAM HCL 2 MG/2ML IJ SOLN
INTRAMUSCULAR | Status: AC
  Filled 2024-03-27: qty 2

## 2024-03-27 MED ORDER — PIPERACILLIN-TAZOBACTAM 3.375 G IVPB 30 MIN
3.3750 g | Freq: Once | INTRAVENOUS | Status: AC
  Administered 2024-03-27: 3.375 g via INTRAVENOUS
  Filled 2024-03-27: qty 50

## 2024-03-27 SURGICAL SUPPLY — 35 items
BAG COUNTER SPONGE SURGICOUNT (BAG) IMPLANT
BLADE SAW RECIP 87.9 MT (BLADE) ×1 IMPLANT
BLADE SURG 21 STRL SS (BLADE) ×1 IMPLANT
BNDG COHESIVE 6X5 TAN ST LF (GAUZE/BANDAGES/DRESSINGS) ×1 IMPLANT
CANISTER WOUND CARE 500ML ATS (WOUND CARE) IMPLANT
COVER SURGICAL LIGHT HANDLE (MISCELLANEOUS) ×1 IMPLANT
CUFF TRNQT CYL 34X4.125X (TOURNIQUET CUFF) IMPLANT
DRAPE INCISE IOBAN 66X45 STRL (DRAPES) ×2 IMPLANT
DRAPE U-SHAPE 47X51 STRL (DRAPES) ×1 IMPLANT
DRESSING PREVENA PLUS CUSTOM (GAUZE/BANDAGES/DRESSINGS) ×1 IMPLANT
DRSG VAC PEEL AND PLACE LRG (GAUZE/BANDAGES/DRESSINGS) IMPLANT
DURAPREP 26ML APPLICATOR (WOUND CARE) ×1 IMPLANT
ELECTRODE REM PT RTRN 9FT ADLT (ELECTROSURGICAL) ×1 IMPLANT
GLOVE BIOGEL PI IND STRL 7.5 (GLOVE) ×1 IMPLANT
GLOVE BIOGEL PI IND STRL 9 (GLOVE) ×1 IMPLANT
GLOVE SURG ORTHO 9.0 STRL STRW (GLOVE) ×1 IMPLANT
GLOVE SURG SS PI 6.5 STRL IVOR (GLOVE) ×1 IMPLANT
GOWN STRL REUS W/ TWL LRG LVL3 (GOWN DISPOSABLE) ×1 IMPLANT
GOWN STRL REUS W/ TWL XL LVL3 (GOWN DISPOSABLE) ×2 IMPLANT
GRAFT SKIN WND SURGICLOSE M95 (Tissue) IMPLANT
KIT BASIN OR (CUSTOM PROCEDURE TRAY) ×1 IMPLANT
KIT TURNOVER KIT B (KITS) ×1 IMPLANT
MANIFOLD NEPTUNE II (INSTRUMENTS) ×1 IMPLANT
MAT PREVALON FULL STRYKER (MISCELLANEOUS) IMPLANT
NS IRRIG 1000ML POUR BTL (IV SOLUTION) ×1 IMPLANT
PACK ORTHO EXTREMITY (CUSTOM PROCEDURE TRAY) ×1 IMPLANT
PAD ARMBOARD POSITIONER FOAM (MISCELLANEOUS) ×1 IMPLANT
PREVENA RESTOR ARTHOFORM 46X30 (CANNISTER) ×1 IMPLANT
STAPLER SKIN PROX 35W (STAPLE) IMPLANT
STOCKINETTE IMPERVIOUS LG (DRAPES) IMPLANT
SUT ETHILON 2 0 PSLX (SUTURE) ×2 IMPLANT
SUT SILK 2-0 18XBRD TIE 12 (SUTURE) ×1 IMPLANT
TOWEL GREEN STERILE FF (TOWEL DISPOSABLE) ×1 IMPLANT
TUBE CONNECTING 20X1/4 (TUBING) ×1 IMPLANT
YANKAUER SUCT BULB TIP NO VENT (SUCTIONS) ×1 IMPLANT

## 2024-03-27 NOTE — ED Notes (Signed)
[facility] called to transport patient. Nurse aware.

## 2024-03-27 NOTE — H&P (Signed)
 NAME:  Fred Mcintosh, MRN:  [id number], DOB:  06/06/1961, LOS: 0 ADMISSION DATE:  03/27/2024, CONSULTATION DATE:  9/10 REFERRING MD:  Yolande, EDP CHIEF COMPLAINT:  nec fasc   History of Present Illness:  63 year old male with past medical history of diabetes, hypertension, asthma, hyperthyroidism/Graves', recent left foot amputation who presented to the emergency department on 9/10 AM with complaint of altered mental status. Per EDP, family called 911 after patient reportedly laying in bed for 9 days. With EMS, blood glucose out of range, noted coffee-ground emesis and dark tarry stools. On arrival to ED, tachycardic to 130s, lethargic. Left foot noted to be dehisced with exposed bone, foul drainage and subcutaneous emphysema up the shin. Labs notable for lactic acid 5.4, Na 128, K 5.9, Cl 89, bicarb 16, BG 816, BUN 101, sCr 2.57, alk phos 127, Tbili 3.3, AG 23, WBC 31.4, Hgb 9, plt 440, INR 1.5, BHB 3.71, UA with trace leukocytes. Blood cultures were drawn and pending. CXR with no acute abnormality. Left foot xray with air around the stump and lower half of leg. Obvious concern for necrotizing fasciitis with septic shock, HHS. He was given 3L LR fluid resuscitation, started on Zyvox  and Zosyn , insulin  drip. Requiring low dose levophed  despite fluid resuscitation. Orthopedic surgery consulted. Dr. Harden to take him to OR later today. CCM was consulted for admission.   Pertinent  Medical History  diabetes, hypertension, asthma, hyperthyroidism/Graves', recent left foot amputation  Significant Hospital Events: Including procedures, antibiotic start and stop dates in addition to other pertinent events   9/10: ED for septic shock vs left foot wound and nec fasc, HHS. Ortho to take to OR, CCM to admit ICU   Interim History / Subjective:  Arrives to ICU, pain in L leg, tells me his name, incomprehensible speech at times   Objective   Blood pressure 118/63, pulse (!) 129, temperature (!) 96.3 F  (35.7 C), resp. rate (!) 25, height 6' 2 (1.88 m), weight (!) 141.5 kg, SpO2 97%.        Intake/Output Summary (Last 24 hours) at 03/27/2024 1043 Last data filed at 03/27/2024 9170 Gross per 24 hour  Intake 100 ml  Output --  Net 100 ml   Filed Weights   03/27/24 0837  Weight: (!) 141.5 kg    Examination: General: middle aged male, acutely ill appearing HENT: pupils 2mm, reactive, anicteric sclera, dry mucous membranes  Lungs: clear bilaterally, room air, resp even and unlabored  Cardiovascular: s1s2, tachycardia, no murmur  Abdomen: rounded, soft, non-tender  Extremities: dressing around left foot, large blister with purulent material beneath on left outside lower leg, 2+ pitting edema to LLE  Neuro: awake, alert, oriented to name, incomprehensible speech at times, follows commands  GU: foley   Resolved Hospital Problem list    Assessment & Plan:  Septic shock 2/2 necrotizing fasciitis of left lower extremity wound  Lactic acidosis  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 In bed for 9 days, found altered, exposed bone and purulent drainage, subcutaneous air consistent with nec fasc. Initial lactic 5.4, repeat 6.3. hypotensive. WBC 31. Given fluid resusc, started on low dose levo.  - orthopedic surgery aware, expected to OR today for source control at 5PM per Dr. Harden  - continue levophed  for MAP > 65  - con't zosyn , zyvox  coverage  - LR @ 125cc/hr  - trend lactic, wbc, fever curve   Hyperglycemic hyperosmolar syndrome History of diabetes  Present with BG 816, BHB 3.71, bicarb 16, AG 23,  venous gas pH 7.39 - f/u osmolality  - con't insulin  gtt  - cbg per endotool  - f/u A1c  Altered mental status  Acute metabolic encephalopathy  Multifactorial AMS in setting of septic encephalopathy, shock, uremia, HHS  - correct underlying metabolic derangements  - f/u ammonia  - frequent neuro checks  - delirium precautions   Acute kidney injury  AGMA  Hyperkalemia   Presenting BUN/sCr 101/2.57, K 5.9, AG 23. Suspect this is combination pre-renal azotemia in setting of likely severe dehydration r/t decreased PO intake in bed for 9 days. Additional component of septic ATN. AGMA combination uremia and lactic acidosis. UA without infection. Foley placed and making urine.  - con't IVF with LR @ 125 ( received initial 3L IVF bolus in ED) - will not correct K with insulin  gtt running, trend for now - no EKG changes  - trend bmp, mag, phos - replete elytes - strict I&O - Avoid nephrotoxic agents, renally dose medications - ensure adequate renal perfusion   Hyponatremia  Related to dehydration  - LR @ 125 cc/hr  - trend   Elevated ALK and Tbili  Alkphos may be related to bony disease process related to problem 1. No RUQ tenderness  - monitor   Hyperthyroidism  - TSH check is 6.747, T4 pending - on synthroid  at home? Hold home meds for now   History of hypertension  - no home meds seen  - in shock, con't levo  History of asthma  - prn albuterol  for wheezing  Labs   CBC: Recent Labs  Lab 03/27/24 0857 03/27/24 0909  WBC  --  31.4*  NEUTROABS  --  28.3*  HGB 10.2* 9.0*  HCT 30.0* 28.1*  MCV  --  84.9  PLT  --  440*    Basic Metabolic Panel: Recent Labs  Lab 03/27/24 0857 03/27/24 0909  NA 125* 128*  K 6.4* 5.9*  CL 95* 89*  CO2  --  16*  GLUCOSE >700* 816*  BUN 96* 101*  CREATININE 2.50* 2.57*  CALCIUM   --  8.2*   GFR: Estimated Creatinine Clearance: 44.6 mL/min (A) (by C-G formula based on SCr of 2.57 mg/dL (H)). Recent Labs  Lab 03/27/24 0909  WBC 31.4*  LATICACIDVEN 5.4*    Liver Function Tests: Recent Labs  Lab 03/27/24 0909  AST 25  ALT 18  ALKPHOS 127*  BILITOT 3.3*  PROT 7.4  ALBUMIN 1.7*   No results for input(s): LIPASE, AMYLASE in the last 168 hours. No results for input(s): AMMONIA in the last 168 hours.  ABG    Component Value Date/Time   PHART 7.439 07/16/2012 1104   PCO2ART 43.8  07/16/2012 1104   PO2ART 57.4 (L) 07/16/2012 1104   HCO3 16.6 (L) 03/27/2024 0909   TCO2 18 (L) 03/27/2024 0857   ACIDBASEDEF 7.1 (H) 03/27/2024 0909   O2SAT 65 03/27/2024 0909     Coagulation Profile: Recent Labs  Lab 03/27/24 0909  INR 1.5*    Cardiac Enzymes: No results for input(s): CKTOTAL, CKMB, CKMBINDEX, TROPONINI in the last 168 hours.  HbA1C: Hgb A1c MFr Bld  Date/Time Value Ref Range Status  01/18/2024 02:25 PM 7.5 (H) 4.8 - 5.6 % Final    Comment:    (NOTE) Diagnosis of Diabetes The following HbA1c ranges recommended by the American Diabetes Association (ADA) may be used as an aid in the diagnosis of diabetes mellitus.  Hemoglobin  Suggested A1C NGSP%              Diagnosis  <5.7                   Non Diabetic  5.7-6.4                Pre-Diabetic  >6.4                   Diabetic  <7.0                   Glycemic control for                       adults with diabetes.      CBG: Recent Labs  Lab 03/27/24 0831 03/27/24 1010 03/27/24 1039  GLUCAP >600* >600* >600*    Review of Systems:   As above  Past Medical History:  He,  has a past medical history of Acute respiratory failure with hypoxia (HCC) (07/13/2012), Anemia, Asthma, Community acquired pneumonia (07/13/2012), Diabetes mellitus without complication (HCC), Graves disease, and Hypertension.   Surgical History:   Past Surgical History:  Procedure Laterality Date   AMPUTATION Left 03/08/2024   Procedure: LEFT TRANSMETATARSAL AMPUTATION;  Surgeon: Harden Jerona GAILS, MD;  [facility] OR;  Service: Orthopedics;  Laterality: Left;   AMPUTATION TOE Left 01/20/2024   Procedure: AMPUTATION, TOE;  Surgeon: Harden Jerona GAILS, MD;  Location: [facility] OR;  Service: Orthopedics;  Laterality: Left;  LEFT FOOT 2ND RAY     Social History:   reports that he has never smoked. He has never been exposed to tobacco smoke. He does not have any smokeless tobacco history on file. He reports that he  does not drink alcohol and does not use drugs.   Family History:  His family history is not on file.   Allergies No Known Allergies   Home Medications  Prior to Admission medications   Medication Sig Start Date End Date Taking? Authorizing Provider  acetaminophen  (TYLENOL ) 325 MG tablet Take 2 tablets (650 mg total) by mouth every 6 (six) hours as needed for mild pain (pain score 1-3) or fever (or Fever >/= 101). 01/23/24   Odell Celinda Balo, MD  levothyroxine  (SYNTHROID ) 50 MCG tablet Take 50 mcg by mouth daily before breakfast. 02/26/24   [provider]  metFORMIN  (GLUCOPHAGE ) 500 MG tablet Take 2 tablets (1,000 mg total) by mouth 2 (two) times daily with a meal. 01/23/24   Odell Celinda Balo, MD  oxyCODONE -acetaminophen  (PERCOCET/ROXICET) 5-325 MG tablet Take 1 tablet by mouth every 4 (four) hours as needed. 03/09/24   Harden Jerona GAILS, MD     Critical care time: 3   Tinnie FORBES Adolph DEVONNA [facility] 03/27/24 12:05 PM  Please see [email or web address] for pager details.  From 7A-7P if no response, please call [phone] After hours, please call ELink [phone]
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
## 2024-03-27 NOTE — Anesthesia Procedure Notes (Signed)
 Arterial Line Insertion Start/End9/04/2024 4:45 PM, 03/27/2024 4:50 PM Performed by: Mallory Manus, MD, anesthesiologist  Patient location: Pre-op. Preanesthetic checklist: patient identified, IV checked, site marked, risks and benefits discussed, surgical consent, monitors and equipment checked, pre-op evaluation, timeout performed and anesthesia consent Left, radial was placed Catheter size: 20 G Hand hygiene performed  and maximum sterile barriers used   Attempts: 1 Procedure performed using ultrasound guided technique. Ultrasound Notes:anatomy identified, needle tip was noted to be adjacent to the nerve/plexus identified and no ultrasound evidence of intravascular and/or intraneural injection Following insertion, dressing applied. Post procedure assessment: normal and unchanged  Patient tolerated the procedure well with no immediate complications.

## 2024-03-27 NOTE — Sepsis Progress Note (Signed)
 Sepsis protocol monitored by eLink

## 2024-03-27 NOTE — ED Notes (Signed)
 Pt verbalized that he is starting to feel better. Pt is alert and oriented x3. Left foot redressed with telfa dressing. Moisturizer placed in mouth on on lips.

## 2024-03-27 NOTE — ED Notes (Addendum)
 Upon assessing pt, pt presented with black coffee ground emesis around mouth and on neck. Pt has obvious cracked dry lips. When pt opens eyes due to pain, pt has obvious yellowing of the scalera. No abnormalities noted on neck, pt has slight rapid breathing at 20 breaths per min. Heart sounds are tachy upon auscultation. Abd sounds are hypoactive in all four quadrants. Pt has soft orange/brown stool noted when rectal temp was attempted. Stage two pressure injury noted on left inner buttocks. Temp foley placed with clear yellow urine noted upon urine return. Purulent drainage with yellow puss discharge noted on left foot due to complete toe amputation with heavy smell noted. MD placed photo in media in chart. Right foot unremarkable.

## 2024-03-27 NOTE — Anesthesia Preprocedure Evaluation (Addendum)
 Anesthesia Evaluation  Patient identified by MRN, date of birth, ID band  Reviewed: Allergy & Precautions, Patient's Chart, lab work & pertinent test results, Unable to perform ROS - Chart review only  Airway Mallampati: II  TM Distance: >3 FB Neck ROM: Full    Dental no notable dental hx. (+) Teeth Intact, Dental Advisory Given,    Pulmonary asthma , pneumonia   Pulmonary exam normal breath sounds clear to auscultation       Cardiovascular hypertension, Pt. on medications Normal cardiovascular exam Rhythm:Regular Rate:Normal     Neuro/Psych AMS    GI/Hepatic negative GI ROS, Neg liver ROS,,,  Endo/Other  diabetes, Type 2  Class 3 obesity  Renal/GU negative Renal ROS     Musculoskeletal negative musculoskeletal ROS (+)    Abdominal  (+) + obese  Peds  Hematology  (+) Blood dyscrasia, anemia   Anesthesia Other Findings   Reproductive/Obstetrics                              Anesthesia Physical Anesthesia Plan  ASA: 4 and emergent  Anesthesia Plan: General   Post-op Pain Management: Minimal or no pain anticipated   Induction: Intravenous, Rapid sequence and Cricoid pressure planned  PONV Risk Score and Plan: 2 and Treatment may vary due to age or medical condition, Ondansetron  and Midazolam   Airway Management Planned: Oral ETT  Additional Equipment: Arterial line  Intra-op Plan:   Post-operative Plan: Post-operative intubation/ventilation  Informed Consent:   Plan Discussed with:   Anesthesia Plan Comments: (PAT note written 02/22/2024 by Allison Zelenak, PA-C.  )         Anesthesia Quick Evaluation

## 2024-03-27 NOTE — Op Note (Signed)
 03/27/2024  5:25 PM  PATIENT:  Fred Mcintosh    PRE-OPERATIVE DIAGNOSIS:  necrotizing fascitis left leg  POST-OPERATIVE DIAGNOSIS:  Same  PROCEDURE:  AMPUTATION, ABOVE KNEE LEFT Application Kerecis micro graft 95 cm to cover wound surface area greater than 200 cm. Application of peel in place wound VAC sponges x 2.  SURGEON:  Jerona LULLA Sage, MD  PHYSICIAN ASSISTANT:None ANESTHESIA:   General  PREOPERATIVE INDICATIONS:  [name] is a  63 y.o. male with a diagnosis of necrotizing fascitis who failed conservative measures and elected for surgical management.    The risks benefits and alternatives were discussed with the patient preoperatively including but not limited to the risks of infection, bleeding, nerve injury, cardiopulmonary complications, the need for revision surgery, among others, and the patient was willing to proceed.  OPERATIVE IMPLANTS:   Implant Name Type Inv. Item Serial No. Manufacturer Lot No. LRB No. Used Action  GRAFT SKIN WND SURGICLOSE M95 - [id number] Tissue GRAFT SKIN WND SURGICLOSE M95  [employer] [id number] Left 1 Implanted    @ENCIMAGES @  OPERATIVE FINDINGS: Tissue margins were clear the muscle had good color and contractility.  OPERATIVE PROCEDURE: Patient was brought to the operating room and underwent a general anesthetic.  After adequate levels anesthesia were obtained patient's left lower extremity was prepped using DuraPrep draped into a sterile field a timeout was called.  An arterial line was placed and patient received 1 unit of packed red blood cells.  A fishmouth incision was made extra-articular just proximal to the patella.  This was carried sharply down to the femur.  The vascular bundle medially was identified and suture-ligated with 2-0 silk.  The posterior flap was created and the bone was cut with a reciprocating saw.  The leg was amputated and delivered off the table.  Electrocautery was used for hemostasis.  There is  good color and contractility of the muscle.  The wound was irrigated with Vashe.  95 cm of Kerecis micro graft was used to reinforce the soft tissue of wound surface area greater than 200 cm.  #1 Vicryl was used to close the deep and superficial fascial layers.  The skin was closed using staples.  2 of the peel in place wound VAC sponges were used to cover the wound.  This had a good suction fit.  Patient remained intubated with an arterial line in stable condition and was transferred back to the ICU.   DISCHARGE PLANNING:  Antibiotic duration: Continue IV antibiotics  Weightbearing: Bed rest  Pain medication: Opioid pathway  Dressing care/ Wound VAC: Continue wound VAC for 1 week  Anticipate discharge to skilled nursing once stabilized.

## 2024-03-27 NOTE — ED Triage Notes (Addendum)
 Pt BIB ems for altered mental status, failure to thrive. EMS reports upon arrival pt reported to be staying in the bed for over 9 days, coffee ground vomit noted, black tarry stools. EMS states family reports just letting him stay in the bed, no eating or drinking noted. Pt has amputated left toes noted. EMS reports poor living conditions. During triage pt noted to have all the reported findings.

## 2024-03-27 NOTE — Transfer of Care (Signed)
 Immediate Anesthesia Transfer of Care Note  Patient: Fred Mcintosh  Procedure(s) Performed: AMPUTATION, ABOVE KNEE LEFT (Left: Knee)  Patient Location: ICU  Anesthesia Type:General  Level of Consciousness: sedated and Patient remains intubated per anesthesia plan  Airway & Oxygen Therapy: Patient remains intubated per anesthesia plan and Patient placed on Ventilator (see vital sign flow sheet for setting)  Post-op Assessment: Report given to RN and Post -op Vital signs reviewed and stable  Post vital signs: Reviewed and stable  Last Vitals:  Vitals Value Taken Time  BP 95/50   Temp 37.4 C 03/27/24 17:53  Pulse 115 03/27/24 17:53  Resp 16 03/27/24 17:53  SpO2 100 % 03/27/24 17:53  Vitals shown include unfiled device data.  Last Pain:  Vitals:   03/27/24 1523  TempSrc: Bladder  PainSc:          Complications: No notable events documented.

## 2024-03-27 NOTE — Progress Notes (Signed)
 eLink Physician-Brief Progress Note Patient Name: KEMARION ABBEY DOB: 1960-11-05 MRN: [id number]   Date of Service  03/27/2024  HPI/Events of Note  KUB reviewed.  eICU Interventions  Okay to use OG tube which is in good position.        Jaci Desanto U Maren Wiesen 03/27/2024, 8:23 PM

## 2024-03-27 NOTE — Progress Notes (Signed)
 eLink Physician-Brief Progress Note Patient Name: Fred Mcintosh DOB: Nov 26, 1960 MRN: [id number]   Date of Service  03/27/2024  HPI/Events of Note  Patient went into atrial fib with RVR, blood pressure is soft.  eICU Interventions  Amiodarone  bolus + gtt ordered.        Eriberto Felch U Cordai Rodrigue 03/27/2024, 7:24 PM

## 2024-03-27 NOTE — Anesthesia Postprocedure Evaluation (Signed)
 Anesthesia Post Note  Patient: Fred Mcintosh  Procedure(s) Performed: AMPUTATION, ABOVE KNEE LEFT (Left: Knee)     Patient location during evaluation: ICU Anesthesia Type: General Level of consciousness: patient remains intubated per anesthesia plan Pain management: pain level controlled Vital Signs Assessment: post-procedure vital signs reviewed and stable Respiratory status: respiratory function stable and patient remains intubated per anesthesia plan Cardiovascular status: stable Anesthetic complications: yes   No notable events documented.  Last Vitals:  Vitals:   03/27/24 2000 03/27/24 2015  BP:    Pulse: (!) 126 (!) 117  Resp: 17 18  Temp: 37.4 C 37.4 C  SpO2: 100% 100%    Last Pain:  Vitals:   03/27/24 2000  TempSrc: Bladder  PainSc:                  Fred Mcintosh

## 2024-03-27 NOTE — ED Notes (Signed)
 Called mother who was in pt chart and went straight to voicemail. Message left to call us  back with number.

## 2024-03-27 NOTE — Consult Note (Addendum)
 WOC Nurse Consult Note: Reason for Consult: Consult requested for sacrum and left buttock wound.  Pt is critically ill with multiple systemic factors which can impair healing.  Wound type: Sacrum/gluteal cleft with Stage 3 pressure injury, 50% yellow, 50% red and moist, 2X2X.2cm Left buttock with scattered area of Stage 3 pressure injuries, 50% red and moist, 50% yellow, 8X4X.1cm Pressure Injury POA: Yes Dressing procedure/placement/frequency: Pt is on a low airloss mattress to decrease pressure. Topical treatment orders provided for bedside nurses to perform as follows to assist with removal of nonviable tissue: Apply Medihoney to sacrum and buttock wounds Q day, then cover with foam dressing.  Change foam dressing Q 3 days or PRN soiling.  Please re-consult if further assistance is needed.  Thank-you,  Stephane Fought MSN, RN, CWOCN, CWCN-AP, CNS Contact Mon-Fri 0700-1500: [phone]

## 2024-03-27 NOTE — Anesthesia Procedure Notes (Signed)
 Procedure Name: Intubation Date/Time: 03/27/2024 4:40 PM  Performed by: Atanacio Arland HERO, CRNAPre-anesthesia Checklist: Patient identified, Emergency Drugs available, Suction available and Patient being monitored Patient Re-evaluated:Patient Re-evaluated prior to induction Oxygen Delivery Method: Circle System Utilized Preoxygenation: Pre-oxygenation with 100% oxygen Induction Type: IV induction and Rapid sequence Laryngoscope Size: Mac and 4 Grade View: Grade III Tube type: Oral Tube size: 8.0 mm Number of attempts: 1 Airway Equipment and Method: Stylet Placement Confirmation: ETT inserted through vocal cords under direct vision, positive ETCO2 and breath sounds checked- equal and bilateral Secured at: 24 cm Tube secured with: Tape Dental Injury: Teeth and Oropharynx as per pre-operative assessment

## 2024-03-27 NOTE — Consult Note (Signed)
 ORTHOPAEDIC CONSULTATION  REQUESTING PHYSICIAN: Harold Scholz, MD  Chief Complaint: Ulceration and pain left lower extremity.  HPI: Fred Mcintosh is a 63 y.o. male who presents with necrotizing fasciitis of the left lower extremity.  He is a poorly controlled diabetic.  Glucose was elevated too high to be monitored.  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  Patient has been home bedbound with foul-smelling drainage from the left foot.  Patient was admitted started on vasopressor support and transferred to [facility] medical ICU.  Past Medical History:  Diagnosis Date   Acute respiratory failure with hypoxia (HCC) 07/13/2012   Secondary to multi-lobar bilateral pneumonia   Anemia    Asthma    Community acquired pneumonia 07/13/2012   Bilateral/multi-lobar   Diabetes mellitus without complication (HCC)    type 2   Graves disease    Hypertension    Past Surgical History:  Procedure Laterality Date   AMPUTATION Left 03/08/2024   Procedure: LEFT TRANSMETATARSAL AMPUTATION;  Surgeon: Harden Jerona GAILS, MD;  Location: [facility] OR;  Service: Orthopedics;  Laterality: Left;   AMPUTATION TOE Left 01/20/2024   Procedure: AMPUTATION, TOE;  Surgeon: Harden Jerona GAILS, MD;  Location: [facility] OR;  Service: Orthopedics;  Laterality: Left;  LEFT FOOT 2ND RAY   Social History   Socioeconomic History   Marital status: Single    Spouse name: Not on file   Number of children: Not on file   Years of education: Not on file   Highest education level: Not on file  Occupational History   Not on file  Tobacco Use   Smoking status: Never    Passive exposure: Never   Smokeless tobacco: Not on file  Vaping Use   Vaping status: Never Used  Substance and Sexual Activity   Alcohol use: No   Drug use: No   Sexual activity: Not on file  Other Topics Concern   Not on file  Social History Narrative   Not on file   Social Drivers of Health   Financial Resource Strain: Not on file  Food Insecurity:  No Food Insecurity (03/08/2024)   Hunger Vital Sign    Worried About Running Out of Food in the Last Year: Never true    Ran Out of Food in the Last Year: Never true  Transportation Needs: No Transportation Needs (03/08/2024)   PRAPARE - Administrator, Civil Service (Medical): No    Lack of Transportation (Non-Medical): No  Physical Activity: Not on file  Stress: Not on file  Social Connections: Moderately Integrated (01/18/2024)   Social Connection and Isolation Panel    Frequency of Communication with Friends and Family: More than three times a week    Frequency of Social Gatherings with Friends and Family: More than three times a week    Attends Religious Services: More than 4 times per year    Active Member of [employer] or Organizations: Yes    Attends Banker Meetings: Never    Marital Status: Never married   History reviewed. No pertinent family history. - negative except otherwise stated in the family history section No Known Allergies Prior to Admission medications   Medication Sig Start Date End Date Taking? Authorizing Provider  acetaminophen  (TYLENOL ) 325 MG tablet Take 2 tablets (650 mg total) by mouth every 6 (six) hours as needed for mild pain (pain score 1-3) or fever (or Fever >/= 101). 01/23/24   Odell Celinda Balo, MD  levothyroxine  (SYNTHROID ) 50 MCG  tablet Take 50 mcg by mouth daily before breakfast. 02/26/24   [provider]  metFORMIN  (GLUCOPHAGE ) 500 MG tablet Take 2 tablets (1,000 mg total) by mouth 2 (two) times daily with a meal. 01/23/24   Odell Celinda Balo, MD  oxyCODONE -acetaminophen  (PERCOCET/ROXICET) 5-325 MG tablet Take 1 tablet by mouth every 4 (four) hours as needed. 03/09/24   Harden Jerona GAILS, MD   DG Chest Port 1 View Result Date: 03/27/2024 CLINICAL DATA:  Questionable sepsis - evaluate for abnormality. EXAM: PORTABLE CHEST 1 VIEW COMPARISON:  08/22/2012. FINDINGS: Low lung volume. Mildly elevated right hemidiaphragm.  Bilateral lung fields are clear. Bilateral costophrenic angles are clear. Normal cardio-mediastinal silhouette. No acute osseous abnormalities. The soft tissues are within normal limits. IMPRESSION: No active disease. Electronically Signed   By: Ree Molt M.D.   On: 03/27/2024 09:33   DG Foot 2 Views Left Result Date: 03/27/2024 CLINICAL DATA:  possible nec fasc EXAM: LEFT FOOT - 2 VIEW; LEFT TIBIA AND FIBULA - 2 VIEW COMPARISON:  01/18/2024. FINDINGS: Bone mineralization within normal limits for patient's age. Since the prior study, patient underwent distal transmetatarsal amputation of all 5 toes. The resection margins are sharp. No focal bone erosions. However, there is air surrounding the stump as well as in the lower half of the leg, which is nonspecific but can be seen with suspected necrotizing fasciitis. No acute fracture or dislocation. No aggressive osseous lesion. Mild degenerative changes of imaged joints. Ankle mortise appears intact. Calcaneal spur noted along the Achilles tendon and Plantar aponeurosis attachment sites. No focal soft tissue swelling. No radiopaque foreign bodies. IMPRESSION: *Since the prior study, patient underwent distal transmetatarsal amputation of all 5 toes. The resection margins are sharp. However, there is air surrounding the stump as well as in the lower half of the leg, which is nonspecific but can be seen with suspected necrotizing fasciitis. Electronically Signed   By: Ree Molt M.D.   On: 03/27/2024 09:33   DG Tibia/Fibula Left Result Date: 03/27/2024 CLINICAL DATA:  possible nec fasc EXAM: LEFT FOOT - 2 VIEW; LEFT TIBIA AND FIBULA - 2 VIEW COMPARISON:  01/18/2024. FINDINGS: Bone mineralization within normal limits for patient's age. Since the prior study, patient underwent distal transmetatarsal amputation of all 5 toes. The resection margins are sharp. No focal bone erosions. However, there is air surrounding the stump as well as in the lower half of the  leg, which is nonspecific but can be seen with suspected necrotizing fasciitis. No acute fracture or dislocation. No aggressive osseous lesion. Mild degenerative changes of imaged joints. Ankle mortise appears intact. Calcaneal spur noted along the Achilles tendon and Plantar aponeurosis attachment sites. No focal soft tissue swelling. No radiopaque foreign bodies. IMPRESSION: *Since the prior study, patient underwent distal transmetatarsal amputation of all 5 toes. The resection margins are sharp. However, there is air surrounding the stump as well as in the lower half of the leg, which is nonspecific but can be seen with suspected necrotizing fasciitis. Electronically Signed   By: Ree Molt M.D.   On: 03/27/2024 09:33   - pertinent xrays, CT, MRI studies were reviewed and independently interpreted  Positive ROS: All other systems have been reviewed and were otherwise negative with the exception of those mentioned in the HPI and as above.  Physical Exam: General: Alert, no acute distress Psychiatric: Patient is competent for consent with normal mood and affect Lymphatic: No axillary or cervical lymphadenopathy Cardiovascular: No pedal edema Respiratory: No cyanosis, no use  of accessory musculature GI: No organomegaly, abdomen is soft and non-tender    Images:  @ENCIMAGES @  Labs:  Lab Results  Component Value Date   HGBA1C 6.7 (H) 03/27/2024   HGBA1C 7.5 (H) 01/18/2024   REPTSTATUS 01/23/2024 FINAL 01/20/2024   GRAMSTAIN  01/20/2024    RARE WBC SEEN FEW GRAM POSITIVE COCCI RARE GRAM NEGATIVE RODS    CULT  01/20/2024    RARE NORMAL SKIN FLORA NO GROUP A STREP (S.PYOGENES) ISOLATED NO STAPHYLOCOCCUS AUREUS ISOLATED MODERATE BACTEROIDES INTERMEDIUS BETA LACTAMASE POSITIVE Performed at [facility], [address] [place], [place] [address]     Lab Results  Component Value Date   ALBUMIN 1.7 (L) 03/27/2024   ALBUMIN 3.2 (L) 03/08/2024   ALBUMIN 3.0 (L) 07/14/2012         Latest Ref Rng & Units 03/27/2024    3:34 PM 03/27/2024    9:09 AM 03/27/2024    8:57 AM  CBC EXTENDED  WBC 4.0 - 10.5 K/uL  31.4    RBC 4.22 - 5.81 MIL/uL  3.31    Hemoglobin 13.0 - 17.0 g/dL 7.8  9.0  89.7   HCT 60.9 - 52.0 % 23.0  28.1  30.0   Platelets 150 - 400 K/uL  440    NEUT# 1.7 - 7.7 K/uL  28.3    Lymph# 0.7 - 4.0 K/uL  2.8      Neurologic: Patient does not have protective sensation bilateral lower extremities.   MUSCULOSKELETAL:   Skin: Examination patient has crepitation and pain to palpation of the calf.  There is no pain or crepitation to palpation in the thigh.  There are necrotic blisters of the left lower extremity.  Patient has dry cracked lips with emesis within his mouth.  Patient grunts in response to questions.  LRINEC score is 11 out of 13.  He is only below the maximum score because the CRP is not available yet.  Hemoglobin 7.8 at 3:30 PM.  Assessment: Assessment: Sepsis with necrotizing fasciitis of the left lower extremity.  Plan: Plan: Attempts to contact his mother were unsuccessful.  Patient is proceeding with surgery as emergency surgery for lifesaving intervention.  I had another orthopedic surgeon review patient's case and he agrees that this is a emergency surgery for life-saving intervention.  Plan for a left above-knee amputation.  Patient has been typed and crossed for 1 unit of packed red blood cells.  Patient is antibiotics and the Levophed  is off.  Thank you for the consult and the opportunity to see Mr. Steed Kanaan, MD [facility] [phone] 4:16 PM

## 2024-03-27 NOTE — ED Notes (Signed)
 Both blood cultures obtained, starting zosyn  now

## 2024-03-27 NOTE — ED Notes (Signed)
 CBG collected at 10:39am

## 2024-03-27 NOTE — ED Notes (Signed)
 CBG obtained, reading HI, POC occult blood obtained, negative

## 2024-03-27 NOTE — ED Provider Notes (Signed)
 Fred Mcintosh EMERGENCY DEPARTMENT AT [facility] Provider Note   CSN: [id number] Arrival date & time: 03/27/24  9171     Patient presents with: Altered Mental Status   [name] is a 63 y.o. male.   63 year old male with a history of hypothyroidism, DM2, and left foot osteomyelitis and phlegmon status post recent ray amputation who presents emergency department with altered mental status.  Family called 911 because patient has been laying in bed for 9 days.  Has been peeing into a urinal.  Patient unable to give any additional history.  EMS reports his blood sugar was too high to be detected.  Also noted some coffee-ground emesis on him as well and dark tarry stools.       Prior to Admission medications   Medication Sig Start Date End Date Taking? Authorizing Provider  acetaminophen  (TYLENOL ) 325 MG tablet Take 2 tablets (650 mg total) by mouth every 6 (six) hours as needed for mild pain (pain score 1-3) or fever (or Fever >/= 101). 01/23/24   Odell Celinda Balo, MD  levothyroxine  (SYNTHROID ) 50 MCG tablet Take 50 mcg by mouth daily before breakfast. 02/26/24   [provider]  metFORMIN  (GLUCOPHAGE ) 500 MG tablet Take 2 tablets (1,000 mg total) by mouth 2 (two) times daily with a meal. 01/23/24   Odell Celinda Balo, MD  oxyCODONE -acetaminophen  (PERCOCET/ROXICET) 5-325 MG tablet Take 1 tablet by mouth every 4 (four) hours as needed. 03/09/24   Harden Jerona GAILS, MD    Allergies: Patient has no known allergies.    Review of Systems  Updated Vital Signs BP 116/67   Pulse (!) 132   Temp (!) 97.2 F (36.2 C)   Resp (!) 22   Ht 6' 2 (1.88 m)   Wt (!) 141.5 kg   SpO2 98%   BMI 40.06 kg/m   Physical Exam Constitutional:      General: He is in acute distress.     Appearance: He is ill-appearing.     Comments: Response to name but is lethargic and hard to arouse.  Covered in emesis.  HENT:     Head: Normocephalic and atraumatic.     Mouth/Throat:      Mouth: Mucous membranes are dry.     Pharynx: Oropharynx is clear.  Eyes:     Comments: Pupils 3 mm bilaterally  Cardiovascular:     Rate and Rhythm: Normal rate and regular rhythm.  Pulmonary:     Effort: Pulmonary effort is normal.     Breath sounds: Normal breath sounds.  Abdominal:     General: There is no distension.     Palpations: There is no mass.     Tenderness: There is abdominal tenderness (Mild, diffuse). There is no guarding.  Musculoskeletal:     Comments: Left lower extremity ray amputation noted.  Wound appears to have partially dehisced with exposed bone.  Purulent foul-smelling discharge.  Does have subcutaneous emphysema palpated and erythema extending up the shin.  Skin:    Capillary Refill: Capillary refill takes more than 3 seconds.  Neurological:     Comments: Disoriented but will try to say his voice.  Moves all 4 extremities but is globally weak     (all labs ordered are listed, but only abnormal results are displayed) Labs Reviewed  LACTIC ACID, PLASMA - Abnormal; Notable for the following components:      Result Value   Lactic Acid, Venous 5.4 (*)    All other components within  normal limits  COMPREHENSIVE METABOLIC PANEL WITH GFR - Abnormal; Notable for the following components:   Sodium 128 (*)    Potassium 5.9 (*)    Chloride 89 (*)    CO2 16 (*)    Glucose, Bld 816 (*)    BUN 101 (*)    Creatinine, Ser 2.57 (*)    Calcium  8.2 (*)    Albumin 1.7 (*)    Alkaline Phosphatase 127 (*)    Total Bilirubin 3.3 (*)    GFR, Estimated 27 (*)    Anion gap 23 (*)    All other components within normal limits  CBC WITH DIFFERENTIAL/PLATELET - Abnormal; Notable for the following components:   WBC 31.4 (*)    RBC 3.31 (*)    Hemoglobin 9.0 (*)    HCT 28.1 (*)    RDW 18.2 (*)    Platelets 440 (*)    nRBC 0.4 (*)    Neutro Abs 28.3 (*)    All other components within normal limits  PROTIME-INR - Abnormal; Notable for the following components:    Prothrombin Time 19.3 (*)    INR 1.5 (*)    All other components within normal limits  BLOOD GAS, VENOUS - Abnormal; Notable for the following components:   pCO2, Ven 27 (*)    Bicarbonate 16.6 (*)    Acid-base deficit 7.1 (*)    All other components within normal limits  URINALYSIS, W/ REFLEX TO CULTURE (INFECTION SUSPECTED) - Abnormal; Notable for the following components:   Color, Urine AMBER (*)    APPearance HAZY (*)    Glucose, UA >=500 (*)    Hgb urine dipstick LARGE (*)    Leukocytes,Ua TRACE (*)    Bacteria, UA RARE (*)    All other components within normal limits  BETA-HYDROXYBUTYRIC ACID - Abnormal; Notable for the following components:   Beta-Hydroxybutyric Acid 3.71 (*)    All other components within normal limits  TSH - Abnormal; Notable for the following components:   TSH 6.747 (*)    All other components within normal limits  CBG MONITORING, ED - Abnormal; Notable for the following components:   Glucose-Capillary >600 (*)    All other components within normal limits  I-STAT CHEM 8, ED - Abnormal; Notable for the following components:   Sodium 125 (*)    Potassium 6.4 (*)    Chloride 95 (*)    BUN 96 (*)    Creatinine, Ser 2.50 (*)    Glucose, Bld >700 (*)    Calcium , Ion 0.99 (*)    TCO2 18 (*)    Hemoglobin 10.2 (*)    HCT 30.0 (*)    All other components within normal limits  CBG MONITORING, ED - Abnormal; Notable for the following components:   Glucose-Capillary >600 (*)    All other components within normal limits  CULTURE, BLOOD (ROUTINE X 2)  CULTURE, BLOOD (ROUTINE X 2)  LACTIC ACID, PLASMA  OSMOLALITY  T4, FREE  C-REACTIVE PROTEIN  BASIC METABOLIC PANEL WITH GFR  BASIC METABOLIC PANEL WITH GFR  BASIC METABOLIC PANEL WITH GFR  BASIC METABOLIC PANEL WITH GFR  POC OCCULT BLOOD, ED  TYPE AND SCREEN    EKG: EKG Interpretation Date/Time:  Wednesday March 27 2024 08:35:17 EDT Ventricular Rate:  112 PR Interval:  180 QRS Duration:  91 QT  Interval:  340 QTC Calculation: 465 R Axis:   115  Text Interpretation: Sinus tachycardia Probable left atrial enlargement Probable lateral infarct, age indeterminate  Confirmed by Yolande Charleston [phone]) on 03/27/2024 9:29:52 AM  Radiology: [facility] Chest Port 1 View Result Date: 03/27/2024 CLINICAL DATA:  Questionable sepsis - evaluate for abnormality. EXAM: PORTABLE CHEST 1 VIEW COMPARISON:  08/22/2012. FINDINGS: Low lung volume. Mildly elevated right hemidiaphragm. Bilateral lung fields are clear. Bilateral costophrenic angles are clear. Normal cardio-mediastinal silhouette. No acute osseous abnormalities. The soft tissues are within normal limits. IMPRESSION: No active disease. Electronically Signed   By: Ree Molt M.D.   On: 03/27/2024 09:33   DG Foot 2 Views Left Result Date: 03/27/2024 CLINICAL DATA:  possible nec fasc EXAM: LEFT FOOT - 2 VIEW; LEFT TIBIA AND FIBULA - 2 VIEW COMPARISON:  01/18/2024. FINDINGS: Bone mineralization within normal limits for patient's age. Since the prior study, patient underwent distal transmetatarsal amputation of all 5 toes. The resection margins are sharp. No focal bone erosions. However, there is air surrounding the stump as well as in the lower half of the leg, which is nonspecific but can be seen with suspected necrotizing fasciitis. No acute fracture or dislocation. No aggressive osseous lesion. Mild degenerative changes of imaged joints. Ankle mortise appears intact. Calcaneal spur noted along the Achilles tendon and Plantar aponeurosis attachment sites. No focal soft tissue swelling. No radiopaque foreign bodies. IMPRESSION: *Since the prior study, patient underwent distal transmetatarsal amputation of all 5 toes. The resection margins are sharp. However, there is air surrounding the stump as well as in the lower half of the leg, which is nonspecific but can be seen with suspected necrotizing fasciitis. Electronically Signed   By: Ree Molt M.D.   On:  03/27/2024 09:33   DG Tibia/Fibula Left Result Date: 03/27/2024 CLINICAL DATA:  possible nec fasc EXAM: LEFT FOOT - 2 VIEW; LEFT TIBIA AND FIBULA - 2 VIEW COMPARISON:  01/18/2024. FINDINGS: Bone mineralization within normal limits for patient's age. Since the prior study, patient underwent distal transmetatarsal amputation of all 5 toes. The resection margins are sharp. No focal bone erosions. However, there is air surrounding the stump as well as in the lower half of the leg, which is nonspecific but can be seen with suspected necrotizing fasciitis. No acute fracture or dislocation. No aggressive osseous lesion. Mild degenerative changes of imaged joints. Ankle mortise appears intact. Calcaneal spur noted along the Achilles tendon and Plantar aponeurosis attachment sites. No focal soft tissue swelling. No radiopaque foreign bodies. IMPRESSION: *Since the prior study, patient underwent distal transmetatarsal amputation of all 5 toes. The resection margins are sharp. However, there is air surrounding the stump as well as in the lower half of the leg, which is nonspecific but can be seen with suspected necrotizing fasciitis. Electronically Signed   By: Ree Molt M.D.   On: 03/27/2024 09:33     Procedures   Medications Ordered in the ED  linezolid  (ZYVOX ) IVPB 600 mg (600 mg Intravenous New Bag/Given 03/27/24 0944)  pantoprazole  (PROTONIX ) injection 80 mg (80 mg Intravenous Given 03/27/24 0855)    Followed by  pantoprazole  (PROTONIX ) injection 40 mg (has no administration in time range)  0.9 %  sodium chloride  infusion (250 mLs Intravenous New Bag/Given 03/27/24 0931)  norepinephrine  (LEVOPHED ) 4mg  in (0.016 mg/mL) premix infusion (1 mcg/min Intravenous Rate/Dose Change 03/27/24 1019)  calcium  gluconate 1 g/ 50 mL sodium chloride  IVPB (1,000 mg Intravenous New Bag/Given 03/27/24 1014)  insulin  regular, human (MYXREDLIN ) 100 units/ 100 mL infusion (11.5 Units/hr Intravenous New Bag/Given 03/27/24  1009)  lactated ringers  infusion (has no administration in time range)  dextrose  5 % in lactated ringers  infusion (has no administration in time range)  dextrose  50 % solution 0-50 mL (has no administration in time range)  sodium chloride  0.9 % bolus 1,000 mL (has no administration in time range)  lactated ringers  bolus 2,000 mL (2,000 mLs Intravenous New Bag/Given 03/27/24 0846)  piperacillin -tazobactam (ZOSYN ) IVPB 3.375 g (0 g Intravenous Stopped 03/27/24 0958)  lactated ringers  bolus 1,000 mL (0 mLs Intravenous Stopped 03/27/24 1015)  ondansetron  (ZOFRAN ) injection 4 mg (4 mg Intravenous Given 03/27/24 1004)    Clinical Course as of 03/27/24 1019  Wed Mar 27, 2024  0927 Discussed with Dr Harden who recommends getting the patient to [facility] ICU and will take to the OR when stable. [RP]  [phone] Dr K[facility] consulted for admission.  [RP]    Clinical Course User Index [RP] Yolande Lamar BROCKS, MD                                 Medical Decision Making Amount and/or Complexity of Data Reviewed Labs: ordered. Radiology: ordered.  Risk Prescription drug management. Decision regarding hospitalization.   Fred Mcintosh is a 63 year old male with a history of hypothyroidism, DM2, and left foot osteomyelitis and phlegmon status post recent ray amputation who presents emergency department with altered mental status.   Initial Ddx:  Necrotizing fasciitis, cellulitis, septic shock, dehydration, HHS, DKA  MDM/Course:  Patient presents to the emergency department with altered mental status after being bedbound for several days.  On exam appears very dehydrated.  Does have grossly infected left foot with subcutaneous emphysema palpated.  Altered but no focal deficits to make me suggest suspect stroke or ICH.  Blood sugar is also undetectably high which likely is causing his altered mental status through HHS.  Code sepsis was activated and he was treated for necrotizing fasciitis.   Did have x-rays that shows gas in his leg.  Lab work was obtained which showed a white blood cell count of 31 and a lactic acid of 5.4.  He was ordered for 30 mL/kg bolus of fluids which ended up being 4 L.  Did require Levophed  temporarily as well.  Was not acidotic on his venous blood gas.  Potassium was 5.9.  Patient started on insulin  for suspected HHS.  Also reported some dark stools recently but was fecal occult negative.  Discussed with orthopedics will take the patient to the OR later today.  ICU from [facility] to admit.  Upon re-evaluation mentation had improved with IV hydration.  Cap refill still over 3 seconds.  Remains on Levophed  at 2 mcg/min.  This patient presents to the ED for concern of complaints listed in HPI, this involves an extensive number of treatment options, and is a complaint that carries with it a high risk of complications and morbidity. Disposition including potential need for admission considered.   Dispo: ICU  Additional history obtained from EMS Records reviewed Outpatient Clinic Notes The following labs were independently interpreted: Chemistry and show AKI, anion gap metabolic acidosis, Hyperglycemia, and HHS I independently reviewed the following imaging with scope of interpretation limited to determining acute life threatening conditions related to emergency care: Extremity x-ray(s) and agree with the radiologist interpretation with the following exceptions: none I personally reviewed and interpreted cardiac monitoring: sinus tachycardia I personally reviewed and interpreted the pt's EKG: see above for interpretation  I have reviewed the patients home medications and  made adjustments as needed Consults: Critical care and Orthopedics  CRITICAL CARE Performed by: Lamar JAYSON Shan   Total critical care time: 60 minutes  Critical care time was exclusive of separately billable procedures and treating other patients.  Critical care was necessary to treat or  prevent imminent or life-threatening deterioration.  Critical care was time spent personally by me on the following activities: development of treatment plan with patient and/or surrogate as well as nursing, discussions with consultants, evaluation of patient's response to treatment, examination of patient, obtaining history from patient or surrogate, ordering and performing treatments and interventions, ordering and review of laboratory studies, ordering and review of radiographic studies, pulse oximetry and re-evaluation of patient's condition.   Portions of this note were generated with Scientist, clinical (histocompatibility and immunogenetics). Dictation errors may occur despite best attempts at proofreading.     Final diagnoses:  Altered mental status, unspecified altered mental status type  Hyperosmolar hyperglycemic state (HHS) (HCC)  Necrotizing fasciitis (HCC)  Septic shock [facility])    ED Discharge Orders     None          Shan Lamar JAYSON, MD 03/27/24 1020

## 2024-03-28 ENCOUNTER — Encounter (HOSPITAL_COMMUNITY): Payer: Self-pay | Admitting: Orthopedic Surgery

## 2024-03-28 DIAGNOSIS — A419 Sepsis, unspecified organism: Secondary | ICD-10-CM | POA: Diagnosis not present

## 2024-03-28 DIAGNOSIS — E1100 Type 2 diabetes mellitus with hyperosmolarity without nonketotic hyperglycemic-hyperosmolar coma (NKHHC): Secondary | ICD-10-CM | POA: Diagnosis not present

## 2024-03-28 DIAGNOSIS — M726 Necrotizing fasciitis: Secondary | ICD-10-CM | POA: Diagnosis not present

## 2024-03-28 DIAGNOSIS — J9601 Acute respiratory failure with hypoxia: Secondary | ICD-10-CM

## 2024-03-28 LAB — BLOOD CULTURE ID PANEL (REFLEXED) - BCID2

## 2024-03-28 LAB — PHOSPHORUS: Phosphorus: 3.5 mg/dL (ref 2.5–4.6)

## 2024-03-28 LAB — BASIC METABOLIC PANEL WITH GFR
Anion gap: 17 — ABNORMAL HIGH (ref 5–15)
BUN: 92 mg/dL — ABNORMAL HIGH (ref 8–23)
CO2: 23 mmol/L (ref 22–32)
Calcium: 7.8 mg/dL — ABNORMAL LOW (ref 8.9–10.3)
Chloride: 99 mmol/L (ref 98–111)
Creatinine, Ser: 2.19 mg/dL — ABNORMAL HIGH (ref 0.61–1.24)
GFR, Estimated: 33 mL/min — ABNORMAL LOW (ref >60)
Glucose, Bld: 314 mg/dL — ABNORMAL HIGH (ref 70–99)
Potassium: 3.8 mmol/L (ref 3.5–5.1)
Sodium: 139 mmol/L (ref 135–145)

## 2024-03-28 LAB — GLUCOSE, CAPILLARY
Glucose-Capillary: 112 mg/dL — ABNORMAL HIGH (ref 70–99)
Glucose-Capillary: 140 mg/dL — ABNORMAL HIGH (ref 70–99)
Glucose-Capillary: 143 mg/dL — ABNORMAL HIGH (ref 70–99)
Glucose-Capillary: 145 mg/dL — ABNORMAL HIGH (ref 70–99)
Glucose-Capillary: 146 mg/dL — ABNORMAL HIGH (ref 70–99)
Glucose-Capillary: 156 mg/dL — ABNORMAL HIGH (ref 70–99)
Glucose-Capillary: 164 mg/dL — ABNORMAL HIGH (ref 70–99)
Glucose-Capillary: 165 mg/dL — ABNORMAL HIGH (ref 70–99)
Glucose-Capillary: 168 mg/dL — ABNORMAL HIGH (ref 70–99)
Glucose-Capillary: 184 mg/dL — ABNORMAL HIGH (ref 70–99)
Glucose-Capillary: 196 mg/dL — ABNORMAL HIGH (ref 70–99)
Glucose-Capillary: 202 mg/dL — ABNORMAL HIGH (ref 70–99)
Glucose-Capillary: 204 mg/dL — ABNORMAL HIGH (ref 70–99)
Glucose-Capillary: 230 mg/dL — ABNORMAL HIGH (ref 70–99)
Glucose-Capillary: 240 mg/dL — ABNORMAL HIGH (ref 70–99)
Glucose-Capillary: 247 mg/dL — ABNORMAL HIGH (ref 70–99)
Glucose-Capillary: 260 mg/dL — ABNORMAL HIGH (ref 70–99)
Glucose-Capillary: 265 mg/dL — ABNORMAL HIGH (ref 70–99)
Glucose-Capillary: 277 mg/dL — ABNORMAL HIGH (ref 70–99)
Glucose-Capillary: 298 mg/dL — ABNORMAL HIGH (ref 70–99)
Glucose-Capillary: 300 mg/dL — ABNORMAL HIGH (ref 70–99)

## 2024-03-28 LAB — PREPARE RBC (CROSSMATCH)

## 2024-03-28 LAB — C-REACTIVE PROTEIN: CRP: 28.4 mg/dL — ABNORMAL HIGH (ref <1.0)

## 2024-03-28 LAB — CBC
HCT: 17.8 % — ABNORMAL LOW (ref 39.0–52.0)
Hemoglobin: 6.2 g/dL — CL (ref 13.0–17.0)
MCH: 28.6 pg (ref 26.0–34.0)
MCHC: 34.8 g/dL (ref 30.0–36.0)
MCV: 82 fL (ref 80.0–100.0)
Platelets: 193 K/uL (ref 150–400)
RBC: 2.17 MIL/uL — ABNORMAL LOW (ref 4.22–5.81)
RDW: 17.3 % — ABNORMAL HIGH (ref 11.5–15.5)
WBC: 18.9 K/uL — ABNORMAL HIGH (ref 4.0–10.5)
nRBC: 0.8 % — ABNORMAL HIGH (ref 0.0–0.2)

## 2024-03-28 LAB — TRIGLYCERIDES: Triglycerides: 169 mg/dL — ABNORMAL HIGH (ref <150)

## 2024-03-28 LAB — T4, FREE: Free T4: 0.62 ng/dL (ref 0.61–1.12)

## 2024-03-28 LAB — MAGNESIUM: Magnesium: 2.6 mg/dL — ABNORMAL HIGH (ref 1.7–2.4)

## 2024-03-28 MED ORDER — VITAMIN C 500 MG PO TABS
1000.0000 mg | ORAL_TABLET | Freq: Every day | ORAL | Status: DC
  Administered 2024-03-30 – 2024-04-01 (×3): 1000 mg
  Filled 2024-03-28 (×5): qty 2

## 2024-03-28 MED ORDER — ORAL CARE MOUTH RINSE: 15.0000 mL | OROMUCOSAL | Status: DC | PRN

## 2024-03-28 MED ORDER — VITAL 1.5 CAL PO LIQD: 1000.0000 mL | ORAL | Status: DC

## 2024-03-28 MED ORDER — HYDRALAZINE HCL 20 MG/ML IJ SOLN
10.0000 mg | INTRAMUSCULAR | Status: DC | PRN
  Administered 2024-03-29: 10 mg via INTRAVENOUS
  Filled 2024-03-28: qty 1

## 2024-03-28 MED ORDER — SODIUM CHLORIDE 0.9% IV SOLUTION: Freq: Once | INTRAVENOUS | Status: AC

## 2024-03-28 MED ORDER — POTASSIUM CHLORIDE 20 MEQ PO PACK
20.0000 meq | PACK | Freq: Once | ORAL | Status: AC
  Administered 2024-03-28: 20 meq
  Filled 2024-03-28: qty 1

## 2024-03-28 MED ORDER — PROSOURCE TF20 ENFIT COMPATIBL EN LIQD
60.0000 mL | Freq: Two times a day (BID) | ENTERAL | Status: DC
  Administered 2024-03-28: 60 mL
  Filled 2024-03-28: qty 60

## 2024-03-28 MED ORDER — LEVOTHYROXINE SODIUM 50 MCG PO TABS
50.0000 ug | ORAL_TABLET | Freq: Every day | ORAL | Status: DC
  Administered 2024-03-28 – 2024-04-02 (×5): 50 ug
  Filled 2024-03-28 (×5): qty 1

## 2024-03-28 MED ORDER — LABETALOL HCL 5 MG/ML IV SOLN
10.0000 mg | INTRAVENOUS | Status: DC | PRN
  Administered 2024-03-28 (×3): 10 mg via INTRAVENOUS
  Filled 2024-03-28 (×3): qty 4

## 2024-03-28 MED ORDER — FENTANYL 2500MCG IN NS 250ML (10MCG/ML) PREMIX INFUSION
0.0000 ug/h | INTRAVENOUS | Status: AC
  Administered 2024-03-28: 25 ug/h via INTRAVENOUS
  Filled 2024-03-28: qty 250

## 2024-03-28 MED ORDER — SODIUM CHLORIDE 0.9% IV SOLUTION
Freq: Once | INTRAVENOUS | Status: DC
Start: 2024-03-28 — End: 2024-04-01

## 2024-03-28 MED ORDER — PANTOPRAZOLE SODIUM 40 MG IV SOLR: 40.0000 mg | INTRAVENOUS | Status: DC

## 2024-03-28 MED ORDER — POTASSIUM CHLORIDE 10 MEQ/100ML IV SOLN: 10.0000 meq | INTRAVENOUS | Status: DC

## 2024-03-28 NOTE — Progress Notes (Signed)
 Patient extubated per MD order with RN at bedside. Patient tolerated well. Vitals stable. Placed on 2L nasal cannula. Able to speak.

## 2024-03-28 NOTE — Progress Notes (Signed)
 NAME:  Fred Mcintosh, MRN:  [id number], DOB:  14-May-1961, LOS: 1 ADMISSION DATE:  03/27/2024, CONSULTATION DATE:  9/11 REFERRING MD:  EDP, CHIEF COMPLAINT:  Necrotizing Fasciitis   History of Present Illness:  63 year old male with past medical history of diabetes, hypertension, asthma, hyperthyroidism/Graves', recent left foot amputation who presented to the emergency department on 9/10 AM with complaint of altered mental status. Per EDP, family called 911 after patient reportedly laying in bed for 9 days. With EMS, blood glucose out of range, noted coffee-ground emesis and dark tarry stools. On arrival to ED, tachycardic to 130s, lethargic. Left foot noted to be dehisced with exposed bone, foul drainage and subcutaneous emphysema up the shin. Labs notable for lactic acid 5.4, Na 128, K 5.9, Cl 89, bicarb 16, BG 816, BUN 101, sCr 2.57, alk phos 127, Tbili 3.3, AG 23, WBC 31.4, Hgb 9, plt 440, INR 1.5, BHB 3.71, UA with trace leukocytes. Blood cultures were drawn and pending. CXR with no acute abnormality. Left foot xray with air around the stump and lower half of leg. Obvious concern for necrotizing fasciitis with septic shock, HHS. He was given 3L LR fluid resuscitation, started on Zyvox  and Zosyn , insulin  drip. Requiring low dose levophed  despite fluid resuscitation. Orthopedic surgery consulted. Dr. Harden to take him to OR later today. CCM was consulted for admission.   Pertinent  Medical History  diabetes, hypertension, asthma, hyperthyroidism/Graves', recent left foot amputation   Significant Hospital Events: Including procedures, antibiotic start and stop dates in addition to other pertinent events   9/10: ED for septic shock vs left foot wound and nec fasc, HHS. Ortho to take to OR, CCM to admit ICU  POD#1 s/p L AKA, wound vac in place. OG tube in place  Interim History / Subjective:  Patient was in Afib w/ RVR overnight, given amio bolus, started on the drip Hgb 5.6, started on blood  transfusion Sugars appropriate on insulin  gtt but required approximately 405u to achieve  Objective    Blood pressure (!) 81/61, pulse 77, temperature 97.9 F (36.6 C), resp. rate 16, height 6' 2 (1.88 m), weight 128.6 kg, SpO2 100%.    Vent Mode: PRVC FiO2 (%):  [50 %-100 %] 50 % Set Rate:  [16 bmp] 16 bmp Vt Set:  [650 mL] 650 mL PEEP:  [5 cmH20] 5 cmH20 Plateau Pressure:  [17 cmH20-18 cmH20] 17 cmH20   Intake/Output Summary (Last 24 hours) at 03/28/2024 0900 Last data filed at 03/28/2024 0600 Gross per 24 hour  Intake 8907.07 ml  Output 1375 ml  Net 7532.07 ml   Filed Weights   03/27/24 0837 03/27/24 1222 03/28/24 0325  Weight: (!) 141.5 kg 128.5 kg 128.6 kg    Examination: General: Ill appearing, intubated HENT: Moist mucous membranes Lungs: CTAB, no increased WOB Cardiovascular: RRR, no m/r/g Abdomen: Soft, nontender Extremities: Warm, well perfused, L AKA site clean, dry and without swelling or erythema. No output from wound vac Neuro: Sedated  Resolved problem list  Septic shock  Assessment and Plan  Sepsis 2/2 necrotizing fasciitis of left lower extremity wound  Lactic acidosis  -s/p L AKA, wound vac in place, 0 cc output ovn -BP stable off Levo, continue to monitor - con't zosyn , zyvox  coverage  - LR @ 125cc/hr  - trend lactic, wbc, fever curve  - intubated post surgically d/t shock, tolerating SBT, will lighten sedation with goal of ventilator liberation   Hyperglycemic hyperosmolar syndrome History of diabetes  Likely in the setting  of massive infection -BG normalized after large amount of insulin , continue endotool for now - f/u osmolality- not yet resulted - A1c 6.7, not on insulin  at home  Acute Metabolic encephalopathy Atrial fibrillation -multifactorial -continue correction of underlying derangements -lessen sedation today -no prior history of Afib, likely in the setting of infection, stress -s/p amiodarone  bolus, continuing gtt   Acute  kidney injury  AGMA  - con't IVF with LR @ 125 ( received initial 3L IVF bolus in ED) - trend bmp, mag, phos - replete electrolytes - strict I&O - Avoid nephrotoxic agents, renally dose medications - ensure adequate renal perfusion    Hyponatremia  Related to dehydration  - LR @ 125 cc/hr  - trend    Elevated ALK and Tbili  Alkphos may be related to bony disease process related to problem 1. No RUQ tenderness  - monitor    Hyperthyroidism  - TSH check is 6.747, T4 pending - on synthroid  at home? Hold home meds for now  -no available records, patient has history of graves, may have had thyroidectomy? -attempted to contact patient's mother for collateral but call went straight to voicemail   History of hypertension  - no home meds seen  - pressures appropriate w/o intervention   History of asthma  - prn albuterol  for wheezing   Labs   CBC: Recent Labs  Lab 03/27/24 0857 03/27/24 0909 03/27/24 1534 03/28/24 0421  WBC  --  31.4*  --  18.9*  NEUTROABS  --  28.3*  --   --   HGB 10.2* 9.0* 7.8* 6.2*  HCT 30.0* 28.1* 23.0* 17.8*  MCV  --  84.9  --  82.0  PLT  --  440*  --  193    Basic Metabolic Panel: Recent Labs  Lab 03/27/24 0857 03/27/24 0909 03/27/24 1319 03/27/24 1534 03/27/24 2041 03/28/24 0421  NA 125* 128* 129* 135  --  139  K 6.4* 5.9* 4.2 3.5  --  3.8  CL 95* 89* 94*  --   --  99  CO2  --  16* 18*  --   --  23  GLUCOSE >700* 816* 715*  --   --  314*  BUN 96* 101* 110*  --   --  92*  CREATININE 2.50* 2.57* 2.60*  --   --  2.19*  CALCIUM   --  8.2* 8.2*  --   --  7.8*  MG  --   --   --   --  2.8* 2.6*  PHOS  --   --   --   --   --  3.5   GFR: Estimated Creatinine Clearance: 49.9 mL/min (A) (by C-G formula based on SCr of 2.19 mg/dL (H)). Recent Labs  Lab 03/27/24 0909 03/27/24 1031 03/27/24 1319 03/27/24 1529 03/28/24 0421  WBC 31.4*  --   --   --  18.9*  LATICACIDVEN 5.4* 6.3* 7.6* 7.6*  --     Liver Function Tests: Recent Labs  Lab  03/27/24 0909 03/27/24 2041  AST 25 32  ALT 18 13  ALKPHOS 127* 94  BILITOT 3.3* 3.5*  PROT 7.4 6.2*  ALBUMIN 1.7* <1.5*   No results for input(s): LIPASE, AMYLASE in the last 168 hours. Recent Labs  Lab 03/27/24 1319  AMMONIA 27    ABG    Component Value Date/Time   PHART 7.552 (H) 03/27/2024 1534   PCO2ART 25.9 (L) 03/27/2024 1534   PO2ART 124 (H) 03/27/2024 1534   HCO3  22.8 03/27/2024 1534   TCO2 24 03/27/2024 1534   ACIDBASEDEF 7.1 (H) 03/27/2024 0909   O2SAT 99 03/27/2024 1534     Coagulation Profile: Recent Labs  Lab 03/27/24 0909  INR 1.5*    Cardiac Enzymes: Recent Labs  Lab 03/27/24 1420  CKTOTAL 59    HbA1C: Hgb A1c MFr Bld  Date/Time Value Ref Range Status  03/27/2024 01:19 PM 6.7 (H) 4.8 - 5.6 % Final    Comment:    (NOTE) Diagnosis of Diabetes The following HbA1c ranges recommended by the American Diabetes Association (ADA) may be used as an aid in the diagnosis of diabetes mellitus.  Hemoglobin             Suggested A1C NGSP%              Diagnosis  <5.7                   Non Diabetic  5.7-6.4                Pre-Diabetic  >6.4                   Diabetic  <7.0                   Glycemic control for                       adults with diabetes.    01/18/2024 02:25 PM 7.5 (H) 4.8 - 5.6 % Final    Comment:    (NOTE) Diagnosis of Diabetes The following HbA1c ranges recommended by the American Diabetes Association (ADA) may be used as an aid in the diagnosis of diabetes mellitus.  Hemoglobin             Suggested A1C NGSP%              Diagnosis  <5.7                   Non Diabetic  5.7-6.4                Pre-Diabetic  >6.4                   Diabetic  <7.0                   Glycemic control for                       adults with diabetes.      CBG: Recent Labs  Lab 03/28/24 0301 03/28/24 0501 03/28/24 0556 03/28/24 0706 03/28/24 0807  GLUCAP 140* 184* 146* 204* 112*    Review of Systems:   Unable to  complete d/t intubation  Past Medical History:  He,  has a past medical history of Acute respiratory failure with hypoxia (HCC) (07/13/2012), Anemia, Asthma, Community acquired pneumonia (07/13/2012), Diabetes mellitus without complication (HCC), Graves disease, and Hypertension.   Surgical History:   Past Surgical History:  Procedure Laterality Date   AMPUTATION Left 03/08/2024   Procedure: LEFT TRANSMETATARSAL AMPUTATION;  Surgeon: Harden Jerona GAILS, MD;  Location: [facility] OR;  Service: Orthopedics;  Laterality: Left;   AMPUTATION TOE Left 01/20/2024   Procedure: AMPUTATION, TOE;  Surgeon: Harden Jerona GAILS, MD;  Location: [facility] OR;  Service: Orthopedics;  Laterality: Left;  LEFT FOOT 2ND RAY     Social History:   reports that he has never smoked. He  has never been exposed to tobacco smoke. He does not have any smokeless tobacco history on file. He reports that he does not drink alcohol and does not use drugs.   Family History:  His family history is not on file.   Allergies No Known Allergies   Home Medications  Prior to Admission medications   Medication Sig Start Date End Date Taking? Authorizing Provider  acetaminophen  (TYLENOL ) 325 MG tablet Take 2 tablets (650 mg total) by mouth every 6 (six) hours as needed for mild pain (pain score 1-3) or fever (or Fever >/= 101). 01/23/24   Odell Celinda Balo, MD  levothyroxine  (SYNTHROID ) 50 MCG tablet Take 50 mcg by mouth daily before breakfast. 02/26/24   [provider]  metFORMIN  (GLUCOPHAGE ) 500 MG tablet Take 2 tablets (1,000 mg total) by mouth 2 (two) times daily with a meal. 01/23/24   Odell Celinda Balo, MD  oxyCODONE -acetaminophen  (PERCOCET/ROXICET) 5-325 MG tablet Take 1 tablet by mouth every 4 (four) hours as needed. 03/09/24   Harden Jerona GAILS, MD     Critical care time:

## 2024-03-28 NOTE — Progress Notes (Signed)
 Patient ID: Fred Mcintosh, male   DOB: 17-Nov-1960, 63 y.o.   MRN: [id number] Patient is postoperative day 1 left above-knee amputation for necrotizing fasciitis.  Patient's white cell count has dropped from 31-18.  There is no drainage in the wound VAC canister.  Hemoglobin 6.2 this morning and I ordered 2 units of packed red blood cells.  Patient's mother has been unable to be reached for the last several days.  Surgery and blood products have been authorized as emergency surgery and emergency release.  CRP ordered this morning.

## 2024-03-28 NOTE — IPAL (Signed)
 Called and spoke with patient's primary care physician, Dr. Leigh, to clarify thyroid  regimen. Patient had limited healthcare encounters but had been started on Levothyroxine  in July after having a TSH of 27. Given improvement to 7.7 at admission, we will restart today and assume acquired hypthyroidism.   Lucie Pinal, DO PGY-2, Family Medicine

## 2024-03-28 NOTE — Progress Notes (Signed)
 Initial Nutrition Assessment  DOCUMENTATION CODES:  Not applicable  INTERVENTION:  If not extubated, recommend the following: Initiate tube feeding via OGT: Vital 1.5 at 65 ml/h (1560 ml per day) Start at 25 and advance by 10mL every 12 hours to reach goal Prosource TF20 60 ml BID Provides 2500 kcal, 145 gm protein, 1192 ml free water  daily 1 packet Juven BID, each packet provides 95 calories, 2.5 grams of protein (collagen), and 9.8 grams of carbohydrate (3 grams sugar); also contains 7 grams of L-arginine and L-glutamine, 300 mg vitamin C , 15 mg vitamin E, 1.2 mcg vitamin B-12, 9.5 mg zinc , 200 mg calcium , and 1.5 g  Calcium  Beta-hydroxy-Beta-methylbutyrate to support wound healing Per MD, vitamin C  1000 mg x 30 days and 220mg  of zinc    NUTRITION DIAGNOSIS:  Increased nutrient needs related to wound healing as evidenced by estimated needs.  GOAL:  Patient will meet greater than or equal to 90% of their needs  MONITOR:  Diet advancement, I & O's, Labs, Skin  REASON FOR ASSESSMENT:  Ventilator, Consult Enteral/tube feeding initiation and management  ASSESSMENT:  Pt with hx of HTN, DM type 2, grave's disease, and recent transmetatarsal amputation presented to ED with AMS and weakness. Family reports pt not getting out of bed x 9 days. In ED, left foot noted to be dehisced with exposed bone, foul drainage and subcutaneous emphysema up the shin.  Workup in ED consistent with necrotizing fascitis.Taken emergently to OR for left AKA.  9/10 - presented to ED, Op, left AKA with wound vac placement   Patient is currently intubated on ventilator support. Grimaces to touch, but does not open eyes. Family not present at bedside.   Pt discussed during ICU rounds and with RN and MD. Hopeful to be able to extubate today, propofol  to be stopped within the hour to assess mental status. If not able to extubate, ok to start TF. Pt may require temporary feeding tube if unable to pass a swallow  evaluation due to his elevated needs for wound healing.   On exam, pt with signs of muscle wasting. Likely has a degree of malnutrition at least in the acute setting, will confirm once able to obtain nutrition intake and recent hx.  MV: 13.2 L/min Temp (24hrs), Avg:98.4 F (36.9 C), Min:96.8 F (36 C), Max:99.3 F (37.4 C) MAP (Art Line):  Admit weight: 128.5 kg (first measured)  Current weight: 128.6 kg   Intake/Output Summary (Last 24 hours) at 03/28/2024 1605 Last data filed at 03/28/2024 1500 Gross per 24 hour  Intake 6939.95 ml  Output 1740 ml  Net 5199.95 ml  Net IO Since Admission: 8,851.84 mL [03/28/24 1605]  Drains/Lines: 14 Fr. OGT UOP x 24 hours Would Vac, left AKA site  Nutritionally Relevant Medications: Scheduled Meds:  vitamin C   1,000 mg Oral Daily   docusate  100 mg Per Tube BID   JUVEN  1 packet Oral BID BM   pantoprazole  (PROTONIX ) IV  40 mg Intravenous Q24H   polyethylene glycol  17 g Per Tube Daily   potassium chloride   20 mEq Per Tube Once   zinc  sulfate   220 mg Oral Daily   Continuous Infusions:  insulin  5.5 Units/hr (03/28/24 0600)   lactated ringers  Stopped (03/27/24 2132)   linezolid  (ZYVOX ) IV 600 mg (03/28/24 0930)   piperacillin -tazobactam 12.5 mL/hr at 03/28/24 0600   propofol  (DIPRIVAN ) infusion 40 mcg/kg/min (03/28/24 0913)   PRN Meds: docusate sodium , ondansetron , polyethylene glycol  Labs Reviewed:  BUN 92, creatinine 2.19 Magnesium 2.6 CBG ranges from 112 - >600 mg/dL over the last 24 hours HgbA1c 6.7%  NUTRITION - FOCUSED PHYSICAL EXAM: Flowsheet Row Most Recent Value  Orbital Region No depletion  Upper Arm Region Mild depletion  [signs of loss]  Thoracic and Lumbar Region No depletion  Buccal Region No depletion  Temple Region No depletion  Clavicle Bone Region Mild depletion  Clavicle and Acromion Bone Region Mild depletion  Scapular Bone Region Mild depletion  Dorsal Hand Unable to assess  [mittens]   Patellar Region Moderate depletion  Anterior Thigh Region Severe depletion  Posterior Calf Region Moderate depletion  Edema (RD Assessment) Moderate  Hair Reviewed  Eyes Reviewed  Mouth Reviewed  Skin Reviewed  Nails Unable to assess    Diet Order:   Diet Order             Diet NPO time specified  Diet effective now                   EDUCATION NEEDS:  Not appropriate for education at this time  Skin:  Skin Assessment: Skin Integrity Issues: Surgical incision, Right AKA, wound vac in place  Per WOC: Stage 3: - Sacrum/gluteal cleft (2 x 2 x 0.2 cm) - left buttock (8 x 4 x 0.1 cm)  Last BM:  9/11 - smear  Height:  Ht Readings from Last 1 Encounters:  03/27/24 6' 2 (1.88 m)   Weight:  Wt Readings from Last 1 Encounters:  03/28/24 128.6 kg   Ideal Body Weight:  77.7 kg (adjusted by 10% for AKA)  BMI:  Body mass index is 36.4 kg/m.  Estimated Nutritional Needs:  Kcal:  2400-2600 kcal/d Protein:  120-145 g/d Fluid:  2.5L/d    Vernell Lukes, RD, LDN, CNSC Registered Dietitian II Please reach out via secure chat

## 2024-03-28 NOTE — Inpatient Diabetes Management (Signed)
 Inpatient Diabetes Program Recommendations  AACE/ADA: New Consensus Statement on Inpatient Glycemic Control (2015)  Target Ranges:  Prepandial:   less than 140 mg/dL      Peak postprandial:   less than 180 mg/dL (1-2 hours)      Critically ill patients:  140 - 180 mg/dL   Lab Results  Component Value Date   GLUCAP 145 (H) 03/28/2024   HGBA1C 6.7 (H) 03/27/2024    Review of Glycemic Control  Diabetes history: DM 2 Outpatient Diabetes medications: metformin  1000 mg bid Current orders for Inpatient glycemic control:  IV insulin   Inpatient Diabetes Program Recommendations:    -   Recommend leaving pt on IV insulin  today due to current insulin  gtt rates between 5.5-9 units/hour. Reassess possible transition tomorrow 9/12.  Thanks,  Clotilda Bull RN, MSN, BC-ADM Inpatient Diabetes Coordinator Team Pager [phone] (8a-5p)

## 2024-03-28 NOTE — Progress Notes (Addendum)
 eLink Physician-Brief Progress Note Patient Name: Fred Mcintosh DOB: [birth year] MRN: [id number]   Date of Service  03/28/2024  HPI/Events of Note  Hemoglobin 6.2 gm / dl, mild bloody output from wound vac.  eICU Interventions  One unit PRBC ordered transfused.        Tomi Paddock U Hawley Michel 03/28/2024, 5:29 AM

## 2024-03-28 NOTE — Progress Notes (Signed)
 Primary RN Mindy could not locate a signed blood consent in patient's chart. This RN attempted to reach patients mother, the only emergency contact listed in chart. Called 2x, both times call went straight to voicemail. Called number listed as patient's cellphone, also went straight to voicemail. Will notify dayshift RN of need to obtain consent for ordered unit of PRBC.

## 2024-03-28 NOTE — Progress Notes (Signed)
 PHARMACY - PHYSICIAN COMMUNICATION CRITICAL VALUE ALERT - BLOOD CULTURE IDENTIFICATION (BCID)  Fred Mcintosh is an 63 y.o. male who presented to [facility] on 03/27/2024 with a chief complaint of altered mental status. Current infectious workup for sepsis in the setting of necrotizing fasciitis s/p above knee amputation (03/28/24).   Assessment:  GPC in chains in 1/3 bottles (aerobic bottle), BCID = Strep species.  (include suspected source if known)  Given that this is Strep species in 1/3 bottles, may represent a contaminant.   Name of physician (or Provider) Contacted: Lucie Pinal, DO  Current antibiotics: Linezolid , pip/tazo  Changes to prescribed antibiotics recommended:   No changes at this time.  - If clinically improved s/p above knee amputation, could possibly consider stopping antibiotics in the next 48 hours if source is considered adequately controlled per ortho and if cultures remain 1/3 bottles.   Results for orders placed or performed during the hospital encounter of 03/27/24  Blood Culture ID Panel (Reflexed) (Collected: 03/27/2024  9:10 AM)  Result Value Ref Range   Enterococcus faecalis NOT DETECTED NOT DETECTED   Enterococcus Faecium NOT DETECTED NOT DETECTED   Listeria monocytogenes NOT DETECTED NOT DETECTED   Staphylococcus species NOT DETECTED NOT DETECTED   Staphylococcus aureus (BCID) NOT DETECTED NOT DETECTED   Staphylococcus epidermidis NOT DETECTED NOT DETECTED   Staphylococcus lugdunensis NOT DETECTED NOT DETECTED   Streptococcus species DETECTED (A) NOT DETECTED   Streptococcus agalactiae NOT DETECTED NOT DETECTED   Streptococcus pneumoniae NOT DETECTED NOT DETECTED   Streptococcus pyogenes NOT DETECTED NOT DETECTED   A.calcoaceticus-baumannii NOT DETECTED NOT DETECTED   Bacteroides fragilis NOT DETECTED NOT DETECTED   Enterobacterales NOT DETECTED NOT DETECTED   Enterobacter cloacae complex NOT DETECTED NOT DETECTED   Escherichia coli NOT  DETECTED NOT DETECTED   Klebsiella aerogenes NOT DETECTED NOT DETECTED   Klebsiella oxytoca NOT DETECTED NOT DETECTED   Klebsiella pneumoniae NOT DETECTED NOT DETECTED   Proteus species NOT DETECTED NOT DETECTED   Salmonella species NOT DETECTED NOT DETECTED   Serratia marcescens NOT DETECTED NOT DETECTED   Haemophilus influenzae NOT DETECTED NOT DETECTED   Neisseria meningitidis NOT DETECTED NOT DETECTED   Pseudomonas aeruginosa NOT DETECTED NOT DETECTED   Stenotrophomonas maltophilia NOT DETECTED NOT DETECTED   Candida albicans NOT DETECTED NOT DETECTED   Candida auris NOT DETECTED NOT DETECTED   Candida glabrata NOT DETECTED NOT DETECTED   Candida krusei NOT DETECTED NOT DETECTED   Candida parapsilosis NOT DETECTED NOT DETECTED   Candida tropicalis NOT DETECTED NOT DETECTED   Cryptococcus neoformans/gattii NOT DETECTED NOT DETECTED    Feliciano Close, PharmD PGY2 Infectious Diseases Pharmacy Resident  03/28/2024 3:31 PM

## 2024-03-29 ENCOUNTER — Encounter: Admitting: Family

## 2024-03-29 DIAGNOSIS — E0500 Thyrotoxicosis with diffuse goiter without thyrotoxic crisis or storm: Secondary | ICD-10-CM

## 2024-03-29 DIAGNOSIS — E11628 Type 2 diabetes mellitus with other skin complications: Secondary | ICD-10-CM | POA: Diagnosis not present

## 2024-03-29 DIAGNOSIS — R7881 Bacteremia: Secondary | ICD-10-CM

## 2024-03-29 DIAGNOSIS — M726 Necrotizing fasciitis: Secondary | ICD-10-CM | POA: Diagnosis not present

## 2024-03-29 DIAGNOSIS — B958 Unspecified staphylococcus as the cause of diseases classified elsewhere: Secondary | ICD-10-CM | POA: Diagnosis not present

## 2024-03-29 LAB — TYPE AND SCREEN
ABO/RH(D): AB POS
Antibody Screen: NEGATIVE
Unit division: 0
Unit division: 0
Unit division: 0

## 2024-03-29 LAB — GLUCOSE, CAPILLARY
Glucose-Capillary: 105 mg/dL — ABNORMAL HIGH (ref 70–99)
Glucose-Capillary: 121 mg/dL — ABNORMAL HIGH (ref 70–99)
Glucose-Capillary: 130 mg/dL — ABNORMAL HIGH (ref 70–99)
Glucose-Capillary: 139 mg/dL — ABNORMAL HIGH (ref 70–99)
Glucose-Capillary: 148 mg/dL — ABNORMAL HIGH (ref 70–99)
Glucose-Capillary: 151 mg/dL — ABNORMAL HIGH (ref 70–99)
Glucose-Capillary: 159 mg/dL — ABNORMAL HIGH (ref 70–99)
Glucose-Capillary: 159 mg/dL — ABNORMAL HIGH (ref 70–99)
Glucose-Capillary: 171 mg/dL — ABNORMAL HIGH (ref 70–99)
Glucose-Capillary: 172 mg/dL — ABNORMAL HIGH (ref 70–99)
Glucose-Capillary: 181 mg/dL — ABNORMAL HIGH (ref 70–99)
Glucose-Capillary: 185 mg/dL — ABNORMAL HIGH (ref 70–99)
Glucose-Capillary: 188 mg/dL — ABNORMAL HIGH (ref 70–99)
Glucose-Capillary: 196 mg/dL — ABNORMAL HIGH (ref 70–99)
Glucose-Capillary: 201 mg/dL — ABNORMAL HIGH (ref 70–99)
Glucose-Capillary: 204 mg/dL — ABNORMAL HIGH (ref 70–99)
Glucose-Capillary: 248 mg/dL — ABNORMAL HIGH (ref 70–99)
Glucose-Capillary: 255 mg/dL — ABNORMAL HIGH (ref 70–99)
Glucose-Capillary: 267 mg/dL — ABNORMAL HIGH (ref 70–99)
Glucose-Capillary: 568 mg/dL (ref 70–99)

## 2024-03-29 LAB — COMPREHENSIVE METABOLIC PANEL WITH GFR
ALT: 10 U/L (ref 0–44)
AST: 24 U/L (ref 15–41)
Albumin: <1.5 g/dL — ABNORMAL LOW (ref 3.5–5.0)
Alkaline Phosphatase: 69 U/L (ref 38–126)
Anion gap: 14 (ref 5–15)
BUN: 89 mg/dL — ABNORMAL HIGH (ref 8–23)
CO2: 26 mmol/L (ref 22–32)
Calcium: 7.9 mg/dL — ABNORMAL LOW (ref 8.9–10.3)
Chloride: 102 mmol/L (ref 98–111)
Creatinine, Ser: 1.97 mg/dL — ABNORMAL HIGH (ref 0.61–1.24)
GFR, Estimated: 38 mL/min — ABNORMAL LOW (ref >60)
Glucose, Bld: 173 mg/dL — ABNORMAL HIGH (ref 70–99)
Potassium: 3.6 mmol/L (ref 3.5–5.1)
Sodium: 142 mmol/L (ref 135–145)
Total Bilirubin: 2.4 mg/dL — ABNORMAL HIGH (ref 0.0–1.2)
Total Protein: 6 g/dL — ABNORMAL LOW (ref 6.5–8.1)

## 2024-03-29 LAB — MAGNESIUM: Magnesium: 2.9 mg/dL — ABNORMAL HIGH (ref 1.7–2.4)

## 2024-03-29 LAB — BPAM RBC
Blood Product Expiration Date: 202510042359
Blood Product Expiration Date: 202510122359
Blood Product Expiration Date: 202509292359
ISSUE DATE / TIME: 202509110703
ISSUE DATE / TIME: 202509110944
ISSUE DATE / TIME: 202509101648
Unit Type and Rh: 8400
Unit Type and Rh: 8400
Unit Type and Rh: 8400

## 2024-03-29 LAB — HEMOGLOBIN AND HEMATOCRIT, BLOOD
HCT: 26.3 % — ABNORMAL LOW (ref 39.0–52.0)
Hemoglobin: 9.2 g/dL — ABNORMAL LOW (ref 13.0–17.0)

## 2024-03-29 LAB — CBC
HCT: 26.4 % — ABNORMAL LOW (ref 39.0–52.0)
Hemoglobin: 9 g/dL — ABNORMAL LOW (ref 13.0–17.0)
MCH: 28.2 pg (ref 26.0–34.0)
MCHC: 34.1 g/dL (ref 30.0–36.0)
MCV: 82.8 fL (ref 80.0–100.0)
Platelets: 153 K/uL (ref 150–400)
RBC: 3.19 MIL/uL — ABNORMAL LOW (ref 4.22–5.81)
RDW: 17 % — ABNORMAL HIGH (ref 11.5–15.5)
WBC: 23 K/uL — ABNORMAL HIGH (ref 4.0–10.5)
nRBC: 0.2 % (ref 0.0–0.2)

## 2024-03-29 LAB — PHOSPHORUS: Phosphorus: 4.1 mg/dL (ref 2.5–4.6)

## 2024-03-29 LAB — T4: T4, Total: 2.9 ug/dL — ABNORMAL LOW (ref 4.5–12.0)

## 2024-03-29 LAB — T3: T3, Total: 92 ng/dL (ref 71–180)

## 2024-03-29 LAB — T3, FREE: T3, Free: 1.4 pg/mL — ABNORMAL LOW (ref 2.0–4.4)

## 2024-03-29 LAB — AMMONIA: Ammonia: 21 umol/L (ref 9–35)

## 2024-03-29 MED ORDER — THROMBIN 5000 UNITS EX KIT
PACK | CUTANEOUS | Status: AC
  Filled 2024-03-29: qty 1

## 2024-03-29 MED ORDER — ACETAMINOPHEN 10 MG/ML IV SOLN
1000.0000 mg | Freq: Four times a day (QID) | INTRAVENOUS | Status: AC
  Administered 2024-03-29 – 2024-03-30 (×4): 1000 mg via INTRAVENOUS
  Filled 2024-03-29 (×4): qty 100

## 2024-03-29 MED ORDER — ZINC SULFATE 220 (50 ZN) MG PO CAPS
220.0000 mg | ORAL_CAPSULE | Freq: Every day | ORAL | Status: DC
  Administered 2024-03-30 – 2024-04-01 (×3): 220 mg
  Filled 2024-03-29 (×5): qty 1

## 2024-03-29 MED ORDER — LABETALOL HCL 5 MG/ML IV SOLN: 10.0000 mg | INTRAVENOUS | Status: DC | PRN

## 2024-03-29 MED ORDER — HYDRALAZINE HCL 20 MG/ML IJ SOLN
10.0000 mg | INTRAMUSCULAR | Status: DC | PRN
  Administered 2024-03-30: 10 mg via INTRAVENOUS
  Filled 2024-03-29: qty 1

## 2024-03-29 MED ORDER — POTASSIUM CHLORIDE 10 MEQ/100ML IV SOLN
10.0000 meq | INTRAVENOUS | Status: AC
  Administered 2024-03-29 (×4): 10 meq via INTRAVENOUS
  Filled 2024-03-29 (×4): qty 100

## 2024-03-29 MED ORDER — LACTATED RINGERS IV SOLN
INTRAVENOUS | Status: AC
Start: 2024-03-29 — End: 2024-03-30

## 2024-03-29 MED ORDER — LIDOCAINE-EPINEPHRINE 1 %-1:100000 IJ SOLN
INTRAMUSCULAR | Status: AC
  Filled 2024-03-29: qty 1

## 2024-03-29 MED ORDER — POLYETHYLENE GLYCOL 3350 17 G PO PACK
17.0000 g | PACK | Freq: Every day | ORAL | Status: DC | PRN
  Filled 2024-03-29: qty 1

## 2024-03-29 MED ORDER — THIAMINE MONONITRATE 100 MG PO TABS
100.0000 mg | ORAL_TABLET | Freq: Every day | ORAL | Status: AC
Start: 2024-03-29 — End: 2024-04-03
  Administered 2024-03-29 – 2024-04-01 (×4): 100 mg
  Filled 2024-03-29 (×5): qty 1

## 2024-03-29 MED ORDER — SODIUM CHLORIDE 0.9 % IV SOLN
2.0000 g | INTRAVENOUS | Status: DC
  Administered 2024-03-29 – 2024-04-02 (×5): 2 g via INTRAVENOUS
  Filled 2024-03-29 (×5): qty 20

## 2024-03-29 MED ORDER — FENTANYL CITRATE PF 50 MCG/ML IJ SOSY
25.0000 ug | PREFILLED_SYRINGE | INTRAMUSCULAR | Status: DC | PRN
  Administered 2024-03-31 (×3): 50 ug via INTRAVENOUS
  Filled 2024-03-29 (×3): qty 1

## 2024-03-29 MED ORDER — VITAL 1.5 CAL PO LIQD
1000.0000 mL | ORAL | Status: DC
  Administered 2024-03-29 – 2024-04-02 (×3): 1000 mL
  Filled 2024-03-29 (×4): qty 1000

## 2024-03-29 MED ORDER — PROSOURCE TF20 ENFIT COMPATIBL EN LIQD
60.0000 mL | Freq: Two times a day (BID) | ENTERAL | Status: DC
  Administered 2024-03-29 – 2024-04-02 (×8): 60 mL
  Filled 2024-03-29 (×9): qty 60

## 2024-03-29 NOTE — Progress Notes (Signed)
 NAME:  Fred Mcintosh, MRN:  [id number], DOB:  03-21-1961, LOS: 2 ADMISSION DATE:  03/27/2024, CONSULTATION DATE:  9/12 REFERRING MD:  EDP, CHIEF COMPLAINT:  Necrotizing Fasciitis   History of Present Illness:  63 year old male with past medical history of diabetes, hypertension, asthma, hyperthyroidism/Graves', recent left foot amputation who presented to the emergency department on 9/10 AM with complaint of altered mental status. Per EDP, family called 911 after patient reportedly laying in bed for 9 days. With EMS, blood glucose out of range, noted coffee-ground emesis and dark tarry stools. On arrival to ED, tachycardic to 130s, lethargic. Left foot noted to be dehisced with exposed bone, foul drainage and subcutaneous emphysema up the shin. Labs notable for lactic acid 5.4, Na 128, K 5.9, Cl 89, bicarb 16, BG 816, BUN 101, sCr 2.57, alk phos 127, Tbili 3.3, AG 23, WBC 31.4, Hgb 9, plt 440, INR 1.5, BHB 3.71, UA with trace leukocytes. Blood cultures were drawn and pending. CXR with no acute abnormality. Left foot xray with air around the stump and lower half of leg. Obvious concern for necrotizing fasciitis with septic shock, HHS. He was given 3L LR fluid resuscitation, started on Zyvox  and Zosyn , insulin  drip. Requiring low dose levophed  despite fluid resuscitation. Orthopedic surgery consulted. Dr. Harden to take him to OR later today. CCM was consulted for admission.   Pertinent  Medical History  diabetes, hypertension, asthma, hyperthyroidism/Graves', recent left foot amputation   Significant Hospital Events: Including procedures, antibiotic start and stop dates in addition to other pertinent events   9/10: ED for septic shock vs left foot wound and nec fasc, HHS. Ortho to take to OR, CCM to admit ICU  9/11 POD#1 s/p L AKA, wound vac in place. Extubated  Interim History / Subjective:  Patient still requiring large volume insulin  Pressors off since early yesterday, maintaining MAP  >65 Extubated, stable on RA Wound vac with 0 cc output Appropriate response to blood transfusion yesterday, Hgb rose from 6.2 to 9.2  Objective    Blood pressure (!) 81/61, pulse 67, temperature (!) 97.2 F (36.2 C), resp. rate 16, height 6' 2 (1.88 m), weight 128.6 kg, SpO2 99%.    Vent Mode: CPAP;PSV FiO2 (%):  [50 %] 50 % Set Rate:  [16 bmp] 16 bmp Vt Set:  [650 mL] 650 mL PEEP:  [5 cmH20] 5 cmH20 Pressure Support:  [8 cmH20] 8 cmH20 Plateau Pressure:  [17 cmH20] 17 cmH20   Intake/Output Summary (Last 24 hours) at 03/29/2024 0723 Last data filed at 03/29/2024 9379 Gross per 24 hour  Intake 3145.51 ml  Output 2425 ml  Net 720.51 ml   Filed Weights   03/27/24 0837 03/27/24 1222 03/28/24 0325  Weight: (!) 141.5 kg 128.5 kg 128.6 kg    Examination: General: Ill appearing HENT: PERRLA EOMI, moist mucous membranes Lungs: CTAB, no increased WOB Cardiovascular: RRR, no m/r/g Abdomen: soft, nontender Extremities: Warm, well perfused. L AKA site clean, dry, without signs of swelling or erythema Neuro: Awakens to voice, nods appropriately. Still somnolent intermittently  Resolved problem list  Septic shock   Assessment and Plan  Sepsis 2/2 necrotizing fasciitis of left lower extremity wound  Lactic acidosis  -POD #2 s/p L AKA, wound vac in place, 0 cc output x 48h -BP elevated despite amio - May discontinue zosyn , zyvox  when source of infection is appropriately controlled.  - LR @ 125cc/hr  -Fever curve normal x 48h   Hyperglycemic hyperosmolar syndrome History of diabetes  Likely  in the setting of massive infection - BG stable but still requiring huge amounts of insulin  -continue fluids LR @ 125 cc/h - Osmolality 362 - A1c 6.7, on metformin  1000 mg BID at home   Acute Metabolic encephalopathy Atrial fibrillation -multifactorial -continue correction of underlying derangements -sedation discontinued -no prior history of Afib, likely in the setting of infection,  stress -s/p amiodarone  bolus, continuing gtt -HR 69, could transition off today   Acute kidney injury  AGMA  - con't IVF with LR @ 125 ( received initial 3L IVF bolus in ED) - trend bmp, mag, phos - replete electrolytes - strict I&O - Avoid nephrotoxic agents, renally dose medications - ensure adequate renal perfusion    Hyponatremia  Related to dehydration  - LR @ 125 cc/hr  - trend    Elevated ALK and Tbili  Alkphos may be related to bony disease process related to problem 1. No RUQ tenderness  - monitor    Hyperthyroidism  - TSH check is 6.747, T4 pending -no available records, patient has history of graves, may have had thyroidectomy? -Patient has had low contact with medical system, was found to be hypothyroid by PCP in July -restarted synthroid  given elevated TSH   History of hypertension  - no home meds seen  - pressures elevated despite amio drip - would likely benefit from antihypertensive therapy moving forward   History of asthma  - prn albuterol  for wheezing   Labs   CBC: Recent Labs  Lab 03/27/24 0857 03/27/24 0909 03/27/24 1534 03/28/24 0421 03/28/24 1852  WBC  --  31.4*  --  18.9*  --   NEUTROABS  --  28.3*  --   --   --   HGB 10.2* 9.0* 7.8* 6.2* 9.2*  HCT 30.0* 28.1* 23.0* 17.8* 26.3*  MCV  --  84.9  --  82.0  --   PLT  --  440*  --  193  --     Basic Metabolic Panel: Recent Labs  Lab 03/27/24 0857 03/27/24 0909 03/27/24 1319 03/27/24 1534 03/27/24 2041 03/28/24 0421  NA 125* 128* 129* 135  --  139  K 6.4* 5.9* 4.2 3.5  --  3.8  CL 95* 89* 94*  --   --  99  CO2  --  16* 18*  --   --  23  GLUCOSE >700* 816* 715*  --   --  314*  BUN 96* 101* 110*  --   --  92*  CREATININE 2.50* 2.57* 2.60*  --   --  2.19*  CALCIUM   --  8.2* 8.2*  --   --  7.8*  MG  --   --   --   --  2.8* 2.6*  PHOS  --   --   --   --   --  3.5   GFR: Estimated Creatinine Clearance: 49.9 mL/min (A) (by C-G formula based on SCr of 2.19 mg/dL (H)). Recent Labs   Lab 03/27/24 0909 03/27/24 1031 03/27/24 1319 03/27/24 1529 03/28/24 0421  WBC 31.4*  --   --   --  18.9*  LATICACIDVEN 5.4* 6.3* 7.6* 7.6*  --     Liver Function Tests: Recent Labs  Lab 03/27/24 0909 03/27/24 2041  AST 25 32  ALT 18 13  ALKPHOS 127* 94  BILITOT 3.3* 3.5*  PROT 7.4 6.2*  ALBUMIN 1.7* <1.5*   No results for input(s): LIPASE, AMYLASE in the last 168 hours. Recent Labs  Lab 03/27/24 1319  AMMONIA 27    ABG    Component Value Date/Time   PHART 7.552 (H) 03/27/2024 1534   PCO2ART 25.9 (L) 03/27/2024 1534   PO2ART 124 (H) 03/27/2024 1534   HCO3 22.8 03/27/2024 1534   TCO2 24 03/27/2024 1534   ACIDBASEDEF 7.1 (H) 03/27/2024 0909   O2SAT 99 03/27/2024 1534     Coagulation Profile: Recent Labs  Lab 03/27/24 0909  INR 1.5*    Cardiac Enzymes: Recent Labs  Lab 03/27/24 1420  CKTOTAL 59    HbA1C: Hgb A1c MFr Bld  Date/Time Value Ref Range Status  03/27/2024 01:19 PM 6.7 (H) 4.8 - 5.6 % Final    Comment:    (NOTE) Diagnosis of Diabetes The following HbA1c ranges recommended by the American Diabetes Association (ADA) may be used as an aid in the diagnosis of diabetes mellitus.  Hemoglobin             Suggested A1C NGSP%              Diagnosis  <5.7                   Non Diabetic  5.7-6.4                Pre-Diabetic  >6.4                   Diabetic  <7.0                   Glycemic control for                       adults with diabetes.    01/18/2024 02:25 PM 7.5 (H) 4.8 - 5.6 % Final    Comment:    (NOTE) Diagnosis of Diabetes The following HbA1c ranges recommended by the American Diabetes Association (ADA) may be used as an aid in the diagnosis of diabetes mellitus.  Hemoglobin             Suggested A1C NGSP%              Diagnosis  <5.7                   Non Diabetic  5.7-6.4                Pre-Diabetic  >6.4                   Diabetic  <7.0                   Glycemic control for                       adults  with diabetes.      CBG: Recent Labs  Lab 03/29/24 0030 03/29/24 0130 03/29/24 0235 03/29/24 0432 03/29/24 0626  GLUCAP 148* 130* 139* 159* 159*    Review of Systems:   Denies pain or discomfort  Past Medical History:  He,  has a past medical history of Acute respiratory failure with hypoxia (HCC) (07/13/2012), Anemia, Asthma, Community acquired pneumonia (07/13/2012), Diabetes mellitus without complication (HCC), Graves disease, and Hypertension.   Surgical History:   Past Surgical History:  Procedure Laterality Date   AMPUTATION Left 03/08/2024   Procedure: LEFT TRANSMETATARSAL AMPUTATION;  Surgeon: Harden Jerona GAILS, MD;  Location: [facility] OR;  Service: Orthopedics;  Laterality: Left;   AMPUTATION Left 03/27/2024   Procedure: AMPUTATION, ABOVE KNEE LEFT;  Surgeon: Harden Jerona GAILS, MD;  Location: [facility] OR;  Service: Orthopedics;  Laterality: Left;   AMPUTATION TOE Left 01/20/2024   Procedure: AMPUTATION, TOE;  Surgeon: Harden Jerona GAILS, MD;  Location: [facility] OR;  Service: Orthopedics;  Laterality: Left;  LEFT FOOT 2ND RAY     Social History:   reports that he has never smoked. He has never been exposed to tobacco smoke. He does not have any smokeless tobacco history on file. He reports that he does not drink alcohol and does not use drugs.   Family History:  His family history is not on file.   Allergies No Known Allergies   Home Medications  Prior to Admission medications   Medication Sig Start Date End Date Taking? Authorizing Provider  acetaminophen  (TYLENOL ) 325 MG tablet Take 2 tablets (650 mg total) by mouth every 6 (six) hours as needed for mild pain (pain score 1-3) or fever (or Fever >/= 101). 01/23/24   Odell Celinda Balo, MD  levothyroxine  (SYNTHROID ) 50 MCG tablet Take 50 mcg by mouth daily before breakfast. 02/26/24   [provider]  metFORMIN  (GLUCOPHAGE ) 500 MG tablet Take 2 tablets (1,000 mg total) by mouth 2 (two) times daily with a meal. 01/23/24   Odell Celinda Balo, MD  oxyCODONE -acetaminophen  (PERCOCET/ROXICET) 5-325 MG tablet Take 1 tablet by mouth every 4 (four) hours as needed. 03/09/24   Harden Jerona GAILS, MD     Critical care time:

## 2024-03-29 NOTE — Evaluation (Signed)
 Clinical/Bedside Swallow Evaluation Patient Details  Name: Fred Mcintosh MRN: [id number] Date of Birth: [birth year]  Today's Date: 03/29/2024 Time: SLP Start Time (ACUTE ONLY): 1100 SLP Stop Time (ACUTE ONLY): 1118 SLP Time Calculation (min) (ACUTE ONLY): 18 min  Past Medical History:  Past Medical History:  Diagnosis Date   Acute respiratory failure with hypoxia (HCC) 07/13/2012   Secondary to multi-lobar bilateral pneumonia   Anemia    Asthma    Community acquired pneumonia 07/13/2012   Bilateral/multi-lobar   Diabetes mellitus without complication (HCC)    type 2   Graves disease    Hypertension    Past Surgical History:  Past Surgical History:  Procedure Laterality Date   AMPUTATION Left 03/08/2024   Procedure: LEFT TRANSMETATARSAL AMPUTATION;  Surgeon: Harden Jerona GAILS, MD;  Location: [facility] OR;  Service: Orthopedics;  Laterality: Left;   AMPUTATION Left 03/27/2024   Procedure: AMPUTATION, ABOVE KNEE LEFT;  Surgeon: Harden Jerona GAILS, MD;  Location: [facility] OR;  Service: Orthopedics;  Laterality: Left;   AMPUTATION TOE Left 01/20/2024   Procedure: AMPUTATION, TOE;  Surgeon: Harden Jerona GAILS, MD;  Location: [facility] OR;  Service: Orthopedics;  Laterality: Left;  LEFT FOOT 2ND RAY   HPI:  63 yo male admitted 9/10 with AMS and weakness after not getting OOB for 9 days. 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 ETT 9/10-9/11. PMH includes: recent L foot amputation, DM, HTN, asthma, Graves disease    Assessment / Plan / Recommendation  Clinical Impression  Pt shows possible signs of post-extubation dysphagia with hoarse vocal quality, but his mentation is what impacts his readiness for POs the most. Although he's really eager for POs and asking for food, he struggles to maintain his alertness even to take a single bite or sip at at time. He needs step-by-step cueing to take POs, including when to open and close his mouth, and to initiate sipping. There is a little coughing  with thin liquids, and diffuse oral residue is seen with purees. Recommend that he remain NPO pending further improvements in mentation. When he has moments of alertness, could offer ice chips after oral care.   SLP Visit Diagnosis: Dysphagia, unspecified (R13.10)    Aspiration Risk       Diet Recommendation Ice chips PRN after oral care    Medication Administration: Via alternative means    Other  Recommendations Oral Care Recommendations: Oral care QID;Oral care prior to ice chip/H20 Caregiver Recommendations: Have oral suction available     Assistance Recommended at Discharge    Functional Status Assessment Patient has had a recent decline in their functional status and demonstrates the ability to make significant improvements in function in a reasonable and predictable amount of time.  Frequency and Duration min 2x/week  2 weeks       Prognosis Prognosis for improved oropharyngeal function: Good Barriers to Reach Goals: Cognitive deficits      Swallow Study   General HPI: 63 yo male admitted 9/10 with AMS and weakness after not getting OOB for 9 days. 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 ETT 9/10-9/11. PMH includes: recent L foot amputation, DM, HTN, asthma, Graves disease Type of Study: Bedside Swallow Evaluation Previous Swallow Assessment: none in chart Diet Prior to this Study: NPO Temperature Spikes Noted: No Respiratory Status: Nasal cannula History of Recent Intubation: Yes Total duration of intubation (days): 2 days Date extubated: 03/28/24 Behavior/Cognition: Lethargic/Drowsy;Requires cueing Oral Cavity Assessment: Dried secretions Oral  Care Completed by SLP: Yes Oral Cavity - Dentition: Adequate natural dentition Self-Feeding Abilities: Total assist Patient Positioning: Upright in bed Baseline Vocal Quality: Hoarse Volitional Cough: Weak (more like huffing, no obvious subglottic pressure) Volitional Swallow: Unable to elicit     Oral/Motor/Sensory Function Overall Oral Motor/Sensory Function:  (reduced ROM but symmetrical, question impact of cognition)   Ice Chips Ice chips: Impaired Presentation: Spoon Oral Phase Impairments: Poor awareness of bolus   Thin Liquid Thin Liquid: Impaired Presentation: Spoon;Straw Oral Phase Impairments: Poor awareness of bolus Pharyngeal  Phase Impairments: Cough - Immediate    Nectar Thick Nectar Thick Liquid: Not tested   Honey Thick Honey Thick Liquid: Not tested   Puree Puree: Impaired Presentation: Spoon Oral Phase Impairments: Poor awareness of bolus Oral Phase Functional Implications: Oral residue   Solid     Solid: Not tested      Leita SAILOR., M.A. CCC-SLP Acute Rehabilitation Services Office: [phone]  Secure chat preferred  03/29/2024,12:06 PM

## 2024-03-29 NOTE — Progress Notes (Signed)
 OT Cancellation Note  Patient Details Name: Fred Mcintosh MRN: [id number] DOB: April 05, 1961   Cancelled Treatment:    Reason Eval/Treat Not Completed: Medical issues which prohibited therapy;Other (comment) (Pt scheduled to return to the OR for an I& D. Will follow up on a later date.)  [facility] 03/29/2024, 11:00 AM Kreg Sink, OT/L   Acute OT Clinical Specialist Acute Rehabilitation Services Pager [phone] Office [phone]

## 2024-03-29 NOTE — Progress Notes (Signed)
 Brief Nutrition Support Note  Pt unable to have diet advanced after extubation and remains NPO. Cortrak placed today. Will reinitiate previously ordered TF regimen recommended in assessment 9/11 and follow up for tolerance next week.    Initiate tube feeding via OGT: Vital 1.5 at 65 ml/h (1560 ml per day) Start at 25 and advance by 10mL every 12 hours to reach goal Prosource TF20 60 ml BID Provides 2500 kcal, 145 gm protein, 1192 ml free water  daily Pt is at risk for refeeding syndrome given probably poor intalke prior to admission. Monitor magnesium and phosphorus daily x 4 days, MD to replete as needed. 100mg  thiamine  x 5 days   Vernell Lukes, RD, LDN, CNSC Registered Dietitian II Please reach out via secure chat

## 2024-03-29 NOTE — Progress Notes (Signed)
 PT Cancellation Note  Patient Details Name: Fred Mcintosh MRN: [id number] DOB: 1961/02/24   Cancelled Treatment:    Reason Eval/Treat Not Completed: (P) Patient at procedure or test/unavailable Pt to return to OR for I&D. PT will follow back tomorrow to determine appropriateness.   Abbiegail Landgren B. Fleeta Lapidus PT, DPT Acute Rehabilitation Services Please use secure chat or  Call Office [phone]  Almarie KATHEE Fleeta [facility] 03/29/2024, 11:05 AM

## 2024-03-29 NOTE — Consult Note (Signed)
 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  Found to have necrotizing fasciitis of the left leg, #Streptococcus species bacteremia secondary to left leg necrotizing fasciitis #Diabetes mellitus #Graves' disease - Noted to have swelling, tenderness for drainage from left foot.  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 - Blood cultures from admission grew 1 out of 2 sets sets streps species. - Unable to provide history.  Recommendations:  -D/C linezolid  and pip-tazo -start ceftriaxone  -Repeat blood Cx -TTE, may need tee -Standard precautions Dr. Gweneth is covering the weekend.  I will be back on service on Monday. Microbiology:   Antibiotics: Piptazo 9/10- Linezolid  9/10-  Cultures: Blood 9/10 1/2 step species Urine  Other   HPI: Fred Mcintosh is a 63 y.o. male with past medical history of diabetes mellitus, hypertension, Graves' disease recently held left foot amputation presented with altered mental status.  Noted swelling, tenderness and foul drainage from left foot.  Started on broad-spectrum antibiotics.  Family had called 911 after patient reported lying in bed for about 9 days.  Orthopedics consulted patient To the OR on 9/10 for AKI in the setting of necrotizing fasciitis of left leg.  Found to have Streptococcus species bacteremia ID engaged for antibiotic recommendations.   Review of Systems: Review of Systems  All other systems reviewed and are  negative.   Past Medical History:  Diagnosis Date   Acute respiratory failure with hypoxia (HCC) 07/13/2012   Secondary to multi-lobar bilateral pneumonia   Anemia    Asthma    Community acquired pneumonia 07/13/2012   Bilateral/multi-lobar   Diabetes mellitus without complication (HCC)    type 2   Graves disease    Hypertension     Social History   Tobacco Use   Smoking status: Never    Passive exposure: Never  Vaping Use   Vaping status: Never Used  Substance Use Topics   Alcohol use: No   Drug use: No    History reviewed. No pertinent family history. Scheduled Meds:  sodium chloride    Intravenous Once   sodium chloride    Intravenous Once   vitamin C   1,000 mg Per Tube Daily   Chlorhexidine  Gluconate Cloth  6 each Topical Daily   docusate  100 mg Per Tube BID   feeding supplement (PROSource TF20)  60 mL Per Tube BID   heparin   5,000 Units Subcutaneous Q8H   leptospermum manuka honey  1 Application Topical Daily   levothyroxine   50 mcg Per Tube Q0600   nutrition supplement (JUVEN)  1 packet Oral BID BM   polyethylene glycol  17 g Per Tube Daily   thiamine   100 mg Per Tube Daily   [START ON 03/30/2024] zinc  sulfate (50mg  elemental zinc )  220 mg Per Tube Daily   Continuous Infusions:  acetaminophen  Stopped (03/29/24 1830)   cefTRIAXone  (ROCEPHIN )  IV Stopped (03/29/24 1311)   feeding supplement (VITAL 1.5 CAL) 65 mL/hr at 03/29/24 2000  insulin  6 Units/hr (03/29/24 2000)   lactated ringers  125 mL/hr at 03/29/24 2000   PRN Meds:.albuterol , dextrose , docusate sodium , fentaNYL  (SUBLIMAZE ) injection, hydrALAZINE , labetalol , ondansetron  (ZOFRAN ) IV, mouth rinse, polyethylene glycol No Known Allergies  OBJECTIVE: Blood pressure 111/62, pulse 80, temperature (!) 96.6 F (35.9 C), resp. rate 17, height 6' 2 (1.88 m), weight 128.6 kg, SpO2 97%.  Physical Exam Constitutional:      General: He is not in acute distress.    Appearance: He is normal weight. He is not  toxic-appearing.  HENT:     Head: Normocephalic and atraumatic.     Right Ear: External ear normal.     Left Ear: External ear normal.     Nose: No congestion or rhinorrhea.     Mouth/Throat:     Mouth: Mucous membranes are moist.     Pharynx: Oropharynx is clear.  Eyes:     General: Scleral icterus present.     Extraocular Movements: Extraocular movements intact.     Pupils: Pupils are equal, round, and reactive to light.  Cardiovascular:     Rate and Rhythm: Normal rate and regular rhythm.     Heart sounds: No murmur heard.    No friction rub. No gallop.  Pulmonary:     Effort: Pulmonary effort is normal.     Breath sounds: Normal breath sounds.  Abdominal:     General: Abdomen is flat. Bowel sounds are normal.     Palpations: Abdomen is soft.  Musculoskeletal:        General: No swelling.     Cervical back: Normal range of motion and neck supple.     Comments: Left aka  Neurological:     Mental Status: He is oriented to person, place, and time.  Psychiatric:        Mood and Affect: Mood normal.     Lab Results Lab Results  Component Value Date   WBC 23.0 (H) 03/29/2024   HGB 9.0 (L) 03/29/2024   HCT 26.4 (L) 03/29/2024   MCV 82.8 03/29/2024   PLT 153 03/29/2024    Lab Results  Component Value Date   CREATININE 1.97 (H) 03/29/2024   BUN 89 (H) 03/29/2024   NA 142 03/29/2024   K 3.6 03/29/2024   CL 102 03/29/2024   CO2 26 03/29/2024    Lab Results  Component Value Date   ALT 10 03/29/2024   AST 24 03/29/2024   ALKPHOS 69 03/29/2024   BILITOT 2.4 (H) 03/29/2024       Loney Stank, MD [facility] 03/29/2024, 10:15 PM

## 2024-03-29 NOTE — Procedures (Signed)
 Cortrak  Person Inserting Tube:  Fred Mcintosh, RD Tube Type:  Cortrak - 43 inches Tube Size:  10 Tube Location:  Left nare Secured by: Bridle Initial Placement:  Gastric Technique Used to Measure Tube Placement:  Marking at nare/corner of mouth Cortrak Secured At:  63 cm Initial Placement Verification:  Cortrak device (Registered Dieticians Only)    Cortrak Tube Team Note:  Consult received to place a Cortrak feeding tube.   No x-ray is required. RN may begin using tube.   If the tube becomes dislodged please keep the tube and contact the Cortrak team at [email or web address] for replacement.  If after hours and replacement cannot be delayed, place a NG tube and confirm placement with an abdominal x-ray.    Trude Ned RD, LDN Contact via [employer].

## 2024-03-29 NOTE — Progress Notes (Incomplete)
 NAME:  Fred Mcintosh, MRN:  [id number], DOB:  15-Dec-1960, LOS: 2 ADMISSION DATE:  03/27/2024, CONSULTATION DATE:  9/12 REFERRING MD:  EDP, CHIEF COMPLAINT:  Necrotizing Fasciitis   History of Present Illness:  63 year old male with past medical history of diabetes, hypertension, asthma, hyperthyroidism/Graves', recent left foot amputation who presented to the emergency department on 9/10 AM with complaint of altered mental status. Per EDP, family called 911 after patient reportedly laying in bed for 9 days. With EMS, blood glucose out of range, noted coffee-ground emesis and dark tarry stools. On arrival to ED, tachycardic to 130s, lethargic. Left foot noted to be dehisced with exposed bone, foul drainage and subcutaneous emphysema up the shin. Labs notable for lactic acid 5.4, Na 128, K 5.9, Cl 89, bicarb 16, BG 816, BUN 101, sCr 2.57, alk phos 127, Tbili 3.3, AG 23, WBC 31.4, Hgb 9, plt 440, INR 1.5, BHB 3.71, UA with trace leukocytes. Blood cultures were drawn and pending. CXR with no acute abnormality. Left foot xray with air around the stump and lower half of leg. Obvious concern for necrotizing fasciitis with septic shock, HHS. He was given 3L LR fluid resuscitation, started on Zyvox  and Zosyn , insulin  drip. Requiring low dose levophed  despite fluid resuscitation. Orthopedic surgery consulted. Dr. Harden to take him to OR later today. CCM was consulted for admission.   Pertinent  Medical History  diabetes, hypertension, asthma, hyperthyroidism/Graves', recent left foot amputation   Significant Hospital Events: Including procedures, antibiotic start and stop dates in addition to other pertinent events   9/10: ED for septic shock vs left foot wound and nec fasc, HHS. Ortho to take to OR, CCM to admit ICU  9/11 POD#1 s/p L AKA, wound vac in place. Extubated  Interim History / Subjective:  More awake today  Objective    Blood pressure (!) 114/58, pulse 74, temperature (!) 97.3 F (36.3  C), resp. rate 18, height 6' 2 (1.88 m), weight 128.6 kg, SpO2 100%.    Vent Mode: CPAP;PSV FiO2 (%):  [50 %] 50 % PEEP:  [5 cmH20] 5 cmH20 Pressure Support:  [8 cmH20] 8 cmH20   Intake/Output Summary (Last 24 hours) at 03/29/2024 1013 Last data filed at 03/29/2024 0900 Gross per 24 hour  Intake 1888.64 ml  Output 2425 ml  Net -536.36 ml   Filed Weights   03/27/24 0837 03/27/24 1222 03/28/24 0325  Weight: (!) 141.5 kg 128.5 kg 128.6 kg    Examination: A little more awake today Cortrak in place Nonlabored breathing pattern Stump dressed without strikethrough RASS -1 answering questions appropriately  CBC and BMP pending this am  Resolved problem list  Septic shock  Reactive Atrial fibrillation Lactic acidosis  Hyperglycemic hyperosmolar syndrome  Assessment and Plan  Sepsis 2/2 necrotizing fasciitis of acute on chronic left lower extremity wound- 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  Limiting his PO intake. Acute kidney injury- stable but elevated from baseline Elevated Tbili- f/u LFTs this am Hypothyroidism- continue PTA synthroid , needs OP f/u History of hypertension  History of asthma  Hx of infrequent medical care- complicating control of above Physical deconditioning  - TTE and consideration for TEE; given tenuous mental status would delay TEE a few days vs just treat empirically for IE; f/u repeat culture  data, abx to ceftriaxone , appreciate ID help - Encourage day/night cycles - Start PO trial, wean off TF as able - Current insulin  needs would be 60+ per day; will start with 30 long acting along with gtt and see how PO and CBG trajectory goes - f/u am labs; continued rise in WBC would also make me  lean toward longer therapy duration - PT/OT help appreciated - Appreciate Harden f/u with stump care - Will also start lasix if am Cr okay - Likely progressive later today  Rolan Sharps MD PCCM

## 2024-03-30 DIAGNOSIS — N179 Acute kidney failure, unspecified: Secondary | ICD-10-CM | POA: Diagnosis not present

## 2024-03-30 DIAGNOSIS — A491 Streptococcal infection, unspecified site: Secondary | ICD-10-CM

## 2024-03-30 DIAGNOSIS — M726 Necrotizing fasciitis: Secondary | ICD-10-CM | POA: Diagnosis not present

## 2024-03-30 DIAGNOSIS — R7881 Bacteremia: Secondary | ICD-10-CM | POA: Diagnosis not present

## 2024-03-30 DIAGNOSIS — E1165 Type 2 diabetes mellitus with hyperglycemia: Secondary | ICD-10-CM

## 2024-03-30 LAB — CBC
HCT: 27 % — ABNORMAL LOW (ref 39.0–52.0)
Hemoglobin: 9 g/dL — ABNORMAL LOW (ref 13.0–17.0)
MCH: 28.4 pg (ref 26.0–34.0)
MCHC: 33.3 g/dL (ref 30.0–36.0)
MCV: 85.2 fL (ref 80.0–100.0)
Platelets: 160 K/uL (ref 150–400)
RBC: 3.17 MIL/uL — ABNORMAL LOW (ref 4.22–5.81)
RDW: 17.2 % — ABNORMAL HIGH (ref 11.5–15.5)
WBC: 13.6 K/uL — ABNORMAL HIGH (ref 4.0–10.5)
nRBC: 0.7 % — ABNORMAL HIGH (ref 0.0–0.2)

## 2024-03-30 LAB — CK: Total CK: 165 U/L (ref 49–397)

## 2024-03-30 LAB — GLUCOSE, CAPILLARY
Glucose-Capillary: 143 mg/dL — ABNORMAL HIGH (ref 70–99)
Glucose-Capillary: 159 mg/dL — ABNORMAL HIGH (ref 70–99)
Glucose-Capillary: 160 mg/dL — ABNORMAL HIGH (ref 70–99)
Glucose-Capillary: 162 mg/dL — ABNORMAL HIGH (ref 70–99)
Glucose-Capillary: 162 mg/dL — ABNORMAL HIGH (ref 70–99)
Glucose-Capillary: 162 mg/dL — ABNORMAL HIGH (ref 70–99)
Glucose-Capillary: 167 mg/dL — ABNORMAL HIGH (ref 70–99)
Glucose-Capillary: 167 mg/dL — ABNORMAL HIGH (ref 70–99)
Glucose-Capillary: 170 mg/dL — ABNORMAL HIGH (ref 70–99)
Glucose-Capillary: 171 mg/dL — ABNORMAL HIGH (ref 70–99)
Glucose-Capillary: 174 mg/dL — ABNORMAL HIGH (ref 70–99)
Glucose-Capillary: 175 mg/dL — ABNORMAL HIGH (ref 70–99)
Glucose-Capillary: 175 mg/dL — ABNORMAL HIGH (ref 70–99)
Glucose-Capillary: 178 mg/dL — ABNORMAL HIGH (ref 70–99)
Glucose-Capillary: 178 mg/dL — ABNORMAL HIGH (ref 70–99)
Glucose-Capillary: 181 mg/dL — ABNORMAL HIGH (ref 70–99)
Glucose-Capillary: 182 mg/dL — ABNORMAL HIGH (ref 70–99)
Glucose-Capillary: 194 mg/dL — ABNORMAL HIGH (ref 70–99)
Glucose-Capillary: 208 mg/dL — ABNORMAL HIGH (ref 70–99)
Glucose-Capillary: 220 mg/dL — ABNORMAL HIGH (ref 70–99)

## 2024-03-30 LAB — BASIC METABOLIC PANEL WITH GFR
Anion gap: 12 (ref 5–15)
Anion gap: 8 (ref 5–15)
Anion gap: 12 (ref 5–15)
BUN: 87 mg/dL — ABNORMAL HIGH (ref 8–23)
BUN: 95 mg/dL — ABNORMAL HIGH (ref 8–23)
BUN: 97 mg/dL — ABNORMAL HIGH (ref 8–23)
CO2: 26 mmol/L (ref 22–32)
CO2: 28 mmol/L (ref 22–32)
CO2: 25 mmol/L (ref 22–32)
Calcium: 7.9 mg/dL — ABNORMAL LOW (ref 8.9–10.3)
Calcium: 8 mg/dL — ABNORMAL LOW (ref 8.9–10.3)
Calcium: 8.1 mg/dL — ABNORMAL LOW (ref 8.9–10.3)
Chloride: 102 mmol/L (ref 98–111)
Chloride: 108 mmol/L (ref 98–111)
Chloride: 107 mmol/L (ref 98–111)
Creatinine, Ser: 1.95 mg/dL — ABNORMAL HIGH (ref 0.61–1.24)
Creatinine, Ser: 1.83 mg/dL — ABNORMAL HIGH (ref 0.61–1.24)
Creatinine, Ser: 1.71 mg/dL — ABNORMAL HIGH (ref 0.61–1.24)
GFR, Estimated: 38 mL/min — ABNORMAL LOW (ref >60)
GFR, Estimated: 41 mL/min — ABNORMAL LOW (ref >60)
GFR, Estimated: 45 mL/min — ABNORMAL LOW (ref >60)
Glucose, Bld: 180 mg/dL — ABNORMAL HIGH (ref 70–99)
Glucose, Bld: 193 mg/dL — ABNORMAL HIGH (ref 70–99)
Glucose, Bld: 180 mg/dL — ABNORMAL HIGH (ref 70–99)
Potassium: 3.6 mmol/L (ref 3.5–5.1)
Potassium: 4.7 mmol/L (ref 3.5–5.1)
Potassium: 4.3 mmol/L (ref 3.5–5.1)
Sodium: 140 mmol/L (ref 135–145)
Sodium: 144 mmol/L (ref 135–145)
Sodium: 144 mmol/L (ref 135–145)

## 2024-03-30 LAB — COMPREHENSIVE METABOLIC PANEL WITH GFR
ALT: 8 U/L (ref 0–44)
AST: 21 U/L (ref 15–41)
Albumin: <1.5 g/dL — ABNORMAL LOW (ref 3.5–5.0)
Alkaline Phosphatase: 70 U/L (ref 38–126)
Anion gap: 11 (ref 5–15)
BUN: 93 mg/dL — ABNORMAL HIGH (ref 8–23)
CO2: 25 mmol/L (ref 22–32)
Calcium: 7.9 mg/dL — ABNORMAL LOW (ref 8.9–10.3)
Chloride: 104 mmol/L (ref 98–111)
Creatinine, Ser: 1.91 mg/dL — ABNORMAL HIGH (ref 0.61–1.24)
GFR, Estimated: 39 mL/min — ABNORMAL LOW (ref >60)
Glucose, Bld: 178 mg/dL — ABNORMAL HIGH (ref 70–99)
Potassium: 4.1 mmol/L (ref 3.5–5.1)
Sodium: 140 mmol/L (ref 135–145)
Total Bilirubin: 1.6 mg/dL — ABNORMAL HIGH (ref 0.0–1.2)
Total Protein: 6.1 g/dL — ABNORMAL LOW (ref 6.5–8.1)

## 2024-03-30 LAB — PHOSPHORUS: Phosphorus: 2.9 mg/dL (ref 2.5–4.6)

## 2024-03-30 LAB — MAGNESIUM: Magnesium: 2.9 mg/dL — ABNORMAL HIGH (ref 1.7–2.4)

## 2024-03-30 MED ORDER — ORAL CARE MOUTH RINSE
15.0000 mL | OROMUCOSAL | Status: DC
  Administered 2024-03-30 – 2024-03-31 (×5): 15 mL via OROMUCOSAL

## 2024-03-30 MED ORDER — INSULIN GLARGINE 100 UNIT/ML ~~LOC~~ SOLN
30.0000 [IU] | Freq: Every day | SUBCUTANEOUS | Status: DC
  Administered 2024-03-30: 30 [IU] via SUBCUTANEOUS
  Filled 2024-03-30 (×2): qty 0.3

## 2024-03-30 MED ORDER — ORAL CARE MOUTH RINSE
15.0000 mL | OROMUCOSAL | Status: DC | PRN
  Administered 2024-03-30: 15 mL via OROMUCOSAL

## 2024-03-30 MED ORDER — POTASSIUM CHLORIDE 20 MEQ PO PACK
40.0000 meq | PACK | ORAL | Status: AC
  Administered 2024-03-30 (×2): 40 meq
  Filled 2024-03-30: qty 2

## 2024-03-30 NOTE — Progress Notes (Signed)
 Family (brothers) at [facility] and are interested in, and asking about, SNF placement after discharge. Per family Patient lives with elderly mother who has poor health and mobility (It was implied that she is unable to care for patient going forward).

## 2024-03-30 NOTE — Evaluation (Signed)
 Physical Therapy Evaluation Patient Details Name: Fred Mcintosh MRN: [id number] DOB: [birth year] Today's Date: 03/30/2024  History of Present Illness  63 y.o. 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 Pt perseverates on place and unable to state time or situation with pt unaware of AKA despite education and visual attention drawn to limb. Pt slow to respond with decreased cognition, strength, balance and mobility who will benefit from acute therapy to maximize mobility, safety and function to decrease burden of care.  Patient will benefit from continued inpatient follow up therapy, <3 hours/day  sPo2 96% on RA HR 84      If plan is discharge home, recommend the following: Two people to help with walking and/or transfers;Two people to help with bathing/dressing/bathroom;Direct supervision/assist for medications management;Assistance with feeding;Assistance with cooking/housework;Direct supervision/assist for financial management;Supervision due to cognitive status;Assist for transportation;Help with stairs or ramp for entrance   Can travel by private vehicle   No    Equipment Recommendations Wheelchair (measurements PT);Wheelchair cushion (measurements PT);BSC/3in1;Hospital bed;Hoyer lift  Recommendations for Other Services  OT consult    Functional Status Assessment Patient has had a recent decline in their functional status and demonstrates the ability to make significant improvements in function in a reasonable and predictable amount of time.     Precautions / Restrictions Precautions Precautions: Fall;Other (comment) Recall of Precautions/Restrictions: Impaired Precaution/Restrictions Comments: VAC, cortrak       Mobility  Bed Mobility Overal bed mobility: Needs Assistance Bed Mobility: Rolling, Supine to Sit, Sit to Supine Rolling: Max assist   Supine to sit: Max assist, +2 for physical assistance, HOB elevated, Used rails     General bed mobility comments: max assist with rail and pad to roll to right with increased time. Side to sit with max +2 assist, HOB 30 degrees and physical assist to pivot pelvis. Return to bed with pt able to lower trunk max assist to position LB and total +2 assist to slide to [facility]. poor balance in sitting with use of UB    Transfers                        Ambulation/Gait                  Stairs            Wheelchair Mobility     Tilt Bed    Modified Rankin (Stroke Patients Only)       Balance Overall balance assessment: Needs assistance Sitting-balance support: Feet supported, Bilateral upper extremity supported Sitting balance-Leahy Scale: Poor Sitting balance - Comments: posterior left lean with min assist initially then reliant on UB to support trunk                                     Pertinent Vitals/Pain Pain Assessment Pain Assessment: Faces Faces Pain Scale: Hurts little more Pain Location: AKA, grimacing with movement and touch Pain Descriptors / Indicators: Grimacing Pain Intervention(s): Limited activity within patient's tolerance, Monitored during session, Repositioned    Home Living Family/patient expects to be discharged to:: Skilled nursing facility Living Arrangements: Parent;Other relatives Available  Help at Discharge: Family;Available 24 hours/day Type of Home: House Home Access: Stairs to enter Entrance Stairs-Rails: Right;Left;Can reach both Entrance Stairs-Number of Steps: 5   Home Layout: One level Home Equipment: Grab bars - tub/shower;Shower seat;Cane - single Librarian, academic (2 wheels)      Prior Function Prior Level of Function : Independent/Modified  Independent;Driving             Mobility Comments: Indep without AD. Denies falls in the last 56mo. prior to TMA, limited mobility once returned home ADLs Comments: Indep with ADLs/IADLs. 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 prior to last admission     Extremity/Trunk Assessment   Upper Extremity Assessment Upper Extremity Assessment: Generalized weakness    Lower Extremity Assessment LLE Deficits / Details: AKA with no active movement to command    Cervical / Trunk Assessment Cervical / Trunk Assessment: Kyphotic Cervical / Trunk Exceptions: Body Habitus  Communication   Communication Communication: No apparent difficulties    Cognition Arousal: Lethargic Behavior During Therapy: Flat affect   PT - Cognitive impairments: No family/caregiver present to determine baseline, Sequencing, Problem solving, Safety/Judgement, Orientation, Awareness, Memory, Difficult to assess, Attention                       PT - Cognition Comments: pt oriented to self and city, poor insight and very limited command following with increased time and multimodal cues Following commands: Impaired Following commands impaired: Follows one step commands inconsistently     Cueing Cueing Techniques: Verbal cues, Gestural cues, Visual cues     General Comments      Exercises     Assessment/Plan    PT Assessment Patient needs continued PT services  PT Problem List Decreased knowledge of precautions;Decreased safety awareness;Decreased balance;Decreased mobility;Decreased strength;Decreased range of motion;Decreased activity tolerance;Decreased knowledge of use of DME;Decreased cognition;Decreased coordination;Obesity;Decreased skin integrity       PT Treatment Interventions DME instruction;Functional mobility training;Therapeutic activities;Therapeutic exercise;Balance training;Patient/family education;Wheelchair mobility  training;Cognitive remediation;Neuromuscular re-education    PT Goals (Current goals can be found in the Care Plan section)  Acute Rehab PT Goals PT Goal Formulation: Patient unable to participate in goal setting Time For Goal Achievement: 04/13/24 Potential to Achieve Goals: Fair    Frequency Min 2X/week     Co-evaluation               AM-PAC PT 6 Clicks Mobility  Outcome Measure Help needed turning from your back to your side while in a flat bed without using bedrails?: A Lot Help needed moving from lying on your back to sitting on the side of a flat bed without using bedrails?: Total Help needed moving to and from a bed to a chair (including a wheelchair)?: Total Help needed standing up from a chair using your arms (e.g., wheelchair or bedside chair)?: Total Help needed to walk in hospital room?: Total Help needed climbing 3-5 steps with a railing? : Total 6 Click Score: 7    End of Session   Activity Tolerance: Patient limited by lethargy Patient left: in bed;with call bell/phone within reach;with nursing/sitter in room Nurse Communication: Mobility status;Need for lift equipment PT Visit Diagnosis: Difficulty in walking, not elsewhere classified (R26.2);Unsteadiness on feet (R26.81);Other abnormalities of gait and mobility (R26.89);Muscle weakness (generalized) (M62.81)    Time: 9099-9084 PT Time Calculation (min) (ACUTE ONLY): 15 min   Charges:   PT Evaluation $PT Eval Moderate Complexity:  1 Mod   PT General Charges $$ ACUTE PT VISIT: 1 Visit         Lenoard SQUIBB, PT Acute Rehabilitation Services Office: [phone]   Marifer Hurd B Alencia Gordon 03/30/2024, 9:30 AM

## 2024-03-31 ENCOUNTER — Inpatient Hospital Stay (HOSPITAL_COMMUNITY)

## 2024-03-31 DIAGNOSIS — I38 Endocarditis, valve unspecified: Secondary | ICD-10-CM

## 2024-03-31 DIAGNOSIS — N179 Acute kidney failure, unspecified: Secondary | ICD-10-CM | POA: Diagnosis not present

## 2024-03-31 DIAGNOSIS — M726 Necrotizing fasciitis: Secondary | ICD-10-CM | POA: Diagnosis not present

## 2024-03-31 DIAGNOSIS — A491 Streptococcal infection, unspecified site: Secondary | ICD-10-CM | POA: Diagnosis not present

## 2024-03-31 DIAGNOSIS — R7881 Bacteremia: Secondary | ICD-10-CM | POA: Diagnosis not present

## 2024-03-31 LAB — GLUCOSE, CAPILLARY
Glucose-Capillary: 129 mg/dL — ABNORMAL HIGH (ref 70–99)
Glucose-Capillary: 148 mg/dL — ABNORMAL HIGH (ref 70–99)
Glucose-Capillary: 155 mg/dL — ABNORMAL HIGH (ref 70–99)
Glucose-Capillary: 168 mg/dL — ABNORMAL HIGH (ref 70–99)
Glucose-Capillary: 175 mg/dL — ABNORMAL HIGH (ref 70–99)
Glucose-Capillary: 176 mg/dL — ABNORMAL HIGH (ref 70–99)
Glucose-Capillary: 190 mg/dL — ABNORMAL HIGH (ref 70–99)
Glucose-Capillary: 196 mg/dL — ABNORMAL HIGH (ref 70–99)
Glucose-Capillary: 226 mg/dL — ABNORMAL HIGH (ref 70–99)
Glucose-Capillary: 234 mg/dL — ABNORMAL HIGH (ref 70–99)
Glucose-Capillary: 252 mg/dL — ABNORMAL HIGH (ref 70–99)
Glucose-Capillary: 271 mg/dL — ABNORMAL HIGH (ref 70–99)

## 2024-03-31 LAB — COMPREHENSIVE METABOLIC PANEL WITH GFR
ALT: 8 U/L (ref 0–44)
AST: 19 U/L (ref 15–41)
Albumin: 1.5 g/dL — ABNORMAL LOW (ref 3.5–5.0)
Alkaline Phosphatase: 75 U/L (ref 38–126)
Anion gap: 10 (ref 5–15)
BUN: 91 mg/dL — ABNORMAL HIGH (ref 8–23)
CO2: 26 mmol/L (ref 22–32)
Calcium: 8.4 mg/dL — ABNORMAL LOW (ref 8.9–10.3)
Chloride: 111 mmol/L (ref 98–111)
Creatinine, Ser: 1.59 mg/dL — ABNORMAL HIGH (ref 0.61–1.24)
GFR, Estimated: 49 mL/min — ABNORMAL LOW (ref >60)
Glucose, Bld: 187 mg/dL — ABNORMAL HIGH (ref 70–99)
Potassium: 4.7 mmol/L (ref 3.5–5.1)
Sodium: 147 mmol/L — ABNORMAL HIGH (ref 135–145)
Total Bilirubin: 1.2 mg/dL (ref 0.0–1.2)
Total Protein: 6.5 g/dL (ref 6.5–8.1)

## 2024-03-31 LAB — CBC
HCT: 29 % — ABNORMAL LOW (ref 39.0–52.0)
Hemoglobin: 9.3 g/dL — ABNORMAL LOW (ref 13.0–17.0)
MCH: 28 pg (ref 26.0–34.0)
MCHC: 32.1 g/dL (ref 30.0–36.0)
MCV: 87.3 fL (ref 80.0–100.0)
Platelets: 152 K/uL (ref 150–400)
RBC: 3.32 MIL/uL — ABNORMAL LOW (ref 4.22–5.81)
RDW: 17.2 % — ABNORMAL HIGH (ref 11.5–15.5)
WBC: 12.9 K/uL — ABNORMAL HIGH (ref 4.0–10.5)
nRBC: 0.9 % — ABNORMAL HIGH (ref 0.0–0.2)

## 2024-03-31 LAB — CULTURE, BLOOD (ROUTINE X 2)

## 2024-03-31 LAB — MAGNESIUM: Magnesium: 3.1 mg/dL — ABNORMAL HIGH (ref 1.7–2.4)

## 2024-03-31 LAB — ECHOCARDIOGRAM COMPLETE BUBBLE STUDY
AR max vel: 3 cm2
AV Area VTI: 3.16 cm2
AV Area mean vel: 2.99 cm2
AV Mean grad: 5 mmHg
AV Peak grad: 7.2 mmHg
Ao pk vel: 1.34 m/s
Area-P 1/2: 2.75 cm2
S' Lateral: 1.7 cm

## 2024-03-31 LAB — PHOSPHORUS: Phosphorus: 2.8 mg/dL (ref 2.5–4.6)

## 2024-03-31 MED ORDER — INSULIN ASPART 100 UNIT/ML IJ SOLN: 0.0000 [IU] | INTRAMUSCULAR | Status: DC

## 2024-03-31 MED ORDER — INSULIN ASPART 100 UNIT/ML IJ SOLN
4.0000 [IU] | INTRAMUSCULAR | Status: DC
  Administered 2024-03-31 – 2024-04-02 (×13): 4 [IU] via SUBCUTANEOUS

## 2024-03-31 MED ORDER — INSULIN GLARGINE 100 UNIT/ML ~~LOC~~ SOLN
50.0000 [IU] | Freq: Every day | SUBCUTANEOUS | Status: DC
  Administered 2024-03-31 – 2024-04-01 (×2): 50 [IU] via SUBCUTANEOUS
  Filled 2024-03-31 (×4): qty 0.5

## 2024-03-31 MED ORDER — JUVEN PO PACK
1.0000 | PACK | Freq: Two times a day (BID) | ORAL | Status: DC
  Administered 2024-03-31 – 2024-04-02 (×4): 1
  Filled 2024-03-31 (×5): qty 1

## 2024-03-31 MED ORDER — INSULIN ASPART 100 UNIT/ML IJ SOLN
0.0000 [IU] | INTRAMUSCULAR | Status: DC
  Administered 2024-03-31: 11 [IU] via SUBCUTANEOUS
  Administered 2024-03-31: 4 [IU] via SUBCUTANEOUS
  Administered 2024-03-31: 11 [IU] via SUBCUTANEOUS
  Administered 2024-03-31: 7 [IU] via SUBCUTANEOUS
  Administered 2024-04-01: 4 [IU] via SUBCUTANEOUS
  Administered 2024-04-01: 7 [IU] via SUBCUTANEOUS
  Administered 2024-04-01: 4 [IU] via SUBCUTANEOUS
  Administered 2024-04-01: 7 [IU] via SUBCUTANEOUS
  Administered 2024-04-01: 4 [IU] via SUBCUTANEOUS
  Administered 2024-04-01: 11 [IU] via SUBCUTANEOUS
  Administered 2024-04-02: 4 [IU] via SUBCUTANEOUS
  Administered 2024-04-02: 3 [IU] via SUBCUTANEOUS
  Administered 2024-04-02: 7 [IU] via SUBCUTANEOUS
  Administered 2024-04-03: 4 [IU] via SUBCUTANEOUS

## 2024-03-31 MED ORDER — PERFLUTREN LIPID MICROSPHERE
1.0000 mL | INTRAVENOUS | Status: AC | PRN
  Administered 2024-03-31: 2 mL via INTRAVENOUS

## 2024-03-31 MED ORDER — GABAPENTIN 250 MG/5ML PO SOLN
100.0000 mg | Freq: Three times a day (TID) | ORAL | Status: DC
  Administered 2024-03-31 – 2024-04-02 (×6): 100 mg
  Filled 2024-03-31 (×8): qty 2

## 2024-03-31 MED ORDER — FREE WATER
300.0000 mL | Status: DC
  Administered 2024-03-31 – 2024-04-01 (×3): 300 mL

## 2024-03-31 MED ORDER — METHOCARBAMOL 1000 MG/10ML IJ SOLN
500.0000 mg | Freq: Three times a day (TID) | INTRAMUSCULAR | Status: DC | PRN
  Administered 2024-04-01: 500 mg via INTRAVENOUS
  Filled 2024-03-31: qty 10

## 2024-03-31 MED ORDER — INSULIN ASPART 100 UNIT/ML IJ SOLN: 4.0000 [IU] | INTRAMUSCULAR | Status: DC

## 2024-03-31 MED ORDER — OXYCODONE HCL 5 MG PO TABS
5.0000 mg | ORAL_TABLET | ORAL | Status: DC | PRN
  Administered 2024-03-31 – 2024-04-02 (×4): 5 mg
  Filled 2024-03-31 (×4): qty 1

## 2024-03-31 MED ORDER — OXYCODONE HCL 5 MG PO TABS: 5.0000 mg | ORAL_TABLET | ORAL | Status: DC | PRN

## 2024-03-31 MED ORDER — DOCUSATE SODIUM 50 MG/5ML PO LIQD: 100.0000 mg | Freq: Two times a day (BID) | ORAL | Status: DC | PRN

## 2024-03-31 MED ORDER — GABAPENTIN 100 MG PO CAPS
100.0000 mg | ORAL_CAPSULE | Freq: Three times a day (TID) | ORAL | Status: DC
  Administered 2024-03-31: 100 mg via ORAL
  Filled 2024-03-31: qty 1

## 2024-03-31 MED ORDER — ACETAMINOPHEN 325 MG PO TABS
650.0000 mg | ORAL_TABLET | Freq: Four times a day (QID) | ORAL | Status: DC
  Administered 2024-03-31 – 2024-04-02 (×8): 650 mg
  Filled 2024-03-31 (×9): qty 2

## 2024-03-31 NOTE — Plan of Care (Signed)
  Problem: Clinical Measurements: Goal: Ability to maintain clinical measurements within normal limits will improve Outcome: Progressing Goal: Respiratory complications will improve Outcome: Progressing   Problem: Activity: Goal: Risk for activity intolerance will decrease Outcome: Progressing   Problem: Clinical Measurements: Goal: Ability to maintain clinical measurements within normal limits will improve Outcome: Progressing Goal: Respiratory complications will improve Outcome: Progressing   Problem: Activity: Goal: Risk for activity intolerance will decrease Outcome: Progressing

## 2024-03-31 NOTE — Progress Notes (Signed)
 Pt stable and ready for transport. Vitals WNL and all belongings w/ pt

## 2024-03-31 NOTE — Progress Notes (Signed)
 NAME:  PENNY FRISBIE, MRN:  [id number], DOB:  October 01, 1960, LOS: 4 ADMISSION DATE:  03/27/2024, CONSULTATION DATE:  9/12 REFERRING MD:  EDP, CHIEF COMPLAINT:  Necrotizing Fasciitis   History of Present Illness:  63 year old male with past medical history of diabetes, hypertension, asthma, hyperthyroidism/Graves', recent left foot amputation who presented to the emergency department on 9/10 AM with complaint of altered mental status. Per EDP, family called 911 after patient reportedly laying in bed for 9 days. With EMS, blood glucose out of range, noted coffee-ground emesis and dark tarry stools. On arrival to ED, tachycardic to 130s, lethargic. Left foot noted to be dehisced with exposed bone, foul drainage and subcutaneous emphysema up the shin. Labs notable for lactic acid 5.4, Na 128, K 5.9, Cl 89, bicarb 16, BG 816, BUN 101, sCr 2.57, alk phos 127, Tbili 3.3, AG 23, WBC 31.4, Hgb 9, plt 440, INR 1.5, BHB 3.71, UA with trace leukocytes. Blood cultures were drawn and pending. CXR with no acute abnormality. Left foot xray with air around the stump and lower half of leg. Obvious concern for necrotizing fasciitis with septic shock, HHS. He was given 3L LR fluid resuscitation, started on Zyvox  and Zosyn , insulin  drip. Requiring low dose levophed  despite fluid resuscitation. Orthopedic surgery consulted. Dr. Harden to take him to OR later today. CCM was consulted for admission.   Pertinent  Medical History  diabetes, hypertension, asthma, hyperthyroidism/Graves', recent left foot amputation   Significant Hospital Events: Including procedures, antibiotic start and stop dates in addition to other pertinent events   9/10: ED for septic shock vs left foot wound and nec fasc, HHS. Ortho to take to OR, CCM to admit ICU  9/11 POD#1 s/p L AKA, wound vac in place. Extubated  Interim History / Subjective:  Fair bit of pain at stump this am.  Objective    Blood pressure (!) 150/80, pulse 83, temperature (!)  97 F (36.1 C), resp. rate 10, height 6' 2 (1.88 m), weight 127.2 kg, SpO2 100%.        Intake/Output Summary (Last 24 hours) at 03/31/2024 0740 Last data filed at 03/31/2024 0700 Gross per 24 hour  Intake 1698.3 ml  Output 2250 ml  Net -551.7 ml   Filed Weights   03/27/24 1222 03/28/24 0325 03/30/24 0500  Weight: 128.5 kg 128.6 kg 127.2 kg    Examination: No distress Still not 100% oriented Stump looks okay Foley with dark urine Abd soft  Resolved problem list  Septic shock  Reactive Atrial fibrillation Lactic acidosis  Hyperglycemic hyperosmolar syndrome Elevated Tbili Hypothyroidism- continue PTA synthroid , needs OP f/u  Assessment and Plan  Sepsis 2/2 necrotizing fasciitis of acute on chronic left lower extremity wound- 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 Strep bacteremia- echo pending, ID consulted per ortho request DM2 with hyperglycemia- variable PO needs have made transitioning off PO difficult Acute Metabolic encephalopathy- see discussion 9/13; think we are improving and mostly now just some lingering ICU delirium Acute kidney injury- stable but elevated from baseline History of hypertension  History of asthma  Hx of infrequent medical care- complicating control of above Physical deconditioning  - Transition off insulin  gtt - Multimodal pain control: add standing tylenol  and low dose gabapentin ; oxycodone  moderate pain, fent severe pain, robaxin  muscle spasm - PT/OT seeing, not sure family can handle current level of care needed, await f/u eval - Ceftriaxone , duration TBD, getting TTE, not a great candidate for TEE but can consider if there are not good transthoracic windows -  Reorient PRN, encourage day/night cycles and family visitation, minimize nightly disruptions - Transfer to PCU, appreciate TRH taking over 04/01/24  Rolan Sharps MD PCCM

## 2024-04-01 DIAGNOSIS — M726 Necrotizing fasciitis: Secondary | ICD-10-CM | POA: Diagnosis not present

## 2024-04-01 LAB — GLUCOSE, CAPILLARY
Glucose-Capillary: 194 mg/dL — ABNORMAL HIGH (ref 70–99)
Glucose-Capillary: 185 mg/dL — ABNORMAL HIGH (ref 70–99)
Glucose-Capillary: 184 mg/dL — ABNORMAL HIGH (ref 70–99)
Glucose-Capillary: 230 mg/dL — ABNORMAL HIGH (ref 70–99)
Glucose-Capillary: 225 mg/dL — ABNORMAL HIGH (ref 70–99)
Glucose-Capillary: 241 mg/dL — ABNORMAL HIGH (ref 70–99)

## 2024-04-01 LAB — CBC
HCT: 27.9 % — ABNORMAL LOW (ref 39.0–52.0)
Hemoglobin: 8.7 g/dL — ABNORMAL LOW (ref 13.0–17.0)
MCH: 28.5 pg (ref 26.0–34.0)
MCHC: 31.2 g/dL (ref 30.0–36.0)
MCV: 91.5 fL (ref 80.0–100.0)
Platelets: 138 K/uL — ABNORMAL LOW (ref 150–400)
RBC: 3.05 MIL/uL — ABNORMAL LOW (ref 4.22–5.81)
RDW: 17.2 % — ABNORMAL HIGH (ref 11.5–15.5)
WBC: 10.6 K/uL — ABNORMAL HIGH (ref 4.0–10.5)
nRBC: 0.7 % — ABNORMAL HIGH (ref 0.0–0.2)

## 2024-04-01 LAB — COMPREHENSIVE METABOLIC PANEL WITH GFR
ALT: 9 U/L (ref 0–44)
AST: 25 U/L (ref 15–41)
Albumin: 1.6 g/dL — ABNORMAL LOW (ref 3.5–5.0)
Alkaline Phosphatase: 71 U/L (ref 38–126)
Anion gap: 13 (ref 5–15)
BUN: 91 mg/dL — ABNORMAL HIGH (ref 8–23)
CO2: 27 mmol/L (ref 22–32)
Calcium: 8.7 mg/dL — ABNORMAL LOW (ref 8.9–10.3)
Chloride: 111 mmol/L (ref 98–111)
Creatinine, Ser: 1.62 mg/dL — ABNORMAL HIGH (ref 0.61–1.24)
GFR, Estimated: 48 mL/min — ABNORMAL LOW (ref >60)
Glucose, Bld: 184 mg/dL — ABNORMAL HIGH (ref 70–99)
Potassium: 5 mmol/L (ref 3.5–5.1)
Sodium: 151 mmol/L — ABNORMAL HIGH (ref 135–145)
Total Bilirubin: 0.9 mg/dL (ref 0.0–1.2)
Total Protein: 6.1 g/dL — ABNORMAL LOW (ref 6.5–8.1)

## 2024-04-01 LAB — C-REACTIVE PROTEIN: CRP: 2.9 mg/dL — ABNORMAL HIGH (ref <1.0)

## 2024-04-01 LAB — PHOSPHORUS: Phosphorus: 3.5 mg/dL (ref 2.5–4.6)

## 2024-04-01 LAB — CULTURE, BLOOD (ROUTINE X 2)
Culture: NO GROWTH
Special Requests: ADEQUATE

## 2024-04-01 LAB — MAGNESIUM: Magnesium: 3.1 mg/dL — ABNORMAL HIGH (ref 1.7–2.4)

## 2024-04-01 LAB — PROCALCITONIN: Procalcitonin: 13.19 ng/mL

## 2024-04-01 LAB — SURGICAL PATHOLOGY

## 2024-04-01 MED ORDER — PANTOPRAZOLE SODIUM 40 MG IV SOLR
40.0000 mg | INTRAVENOUS | Status: DC
  Administered 2024-04-01 – 2024-04-09 (×9): 40 mg via INTRAVENOUS
  Filled 2024-04-01 (×9): qty 10

## 2024-04-01 MED ORDER — FREE WATER
300.0000 mL | Status: DC
Start: 2024-04-01 — End: 2024-04-03
  Administered 2024-04-01 – 2024-04-02 (×8): 300 mL

## 2024-04-01 MED ORDER — INSULIN GLARGINE 100 UNIT/ML ~~LOC~~ SOLN
10.0000 [IU] | Freq: Once | SUBCUTANEOUS | Status: AC
  Administered 2024-04-01: 10 [IU] via SUBCUTANEOUS
  Filled 2024-04-01: qty 0.1

## 2024-04-01 MED ORDER — DEXTROSE 5 % IV SOLN: INTRAVENOUS | Status: AC

## 2024-04-01 NOTE — Plan of Care (Signed)
  Problem: Education: Goal: Knowledge of General Education information will improve Description: Including pain rating scale, medication(s)/side effects and non-pharmacologic comfort measures Outcome: Progressing   Problem: Clinical Measurements: Goal: Will remain free from infection Outcome: Progressing Goal: Diagnostic test results will improve Outcome: Progressing   Problem: Activity: Goal: Risk for activity intolerance will decrease Outcome: Progressing   Problem: Nutrition: Goal: Adequate nutrition will be maintained Outcome: Progressing   Problem: Coping: Goal: Level of anxiety will decrease Outcome: Progressing   Problem: Safety: Goal: Ability to remain free from injury will improve Outcome: Progressing

## 2024-04-01 NOTE — Progress Notes (Signed)
 PROGRESS NOTE                                                                                                                                                                                                             Patient Demographics:    Fred Mcintosh, is a 63 y.o. male, DOB - Feb 16, 1961, FMW:[id number]  Outpatient Primary MD for the patient is [name], MD    LOS - 5  Admit date - 03/27/2024    Chief Complaint  Patient presents with   Altered Mental Status       Brief Narrative (HPI from H&P)   63 year old male with past medical history of diabetes, hypertension, asthma, hyperthyroidism/[place]', recent left foot amputation who presented to the emergency department on 9/10 AM with complaint of altered mental status. Per EDP, family called 911 after patient reportedly laying in bed for 9 days. With EMS, blood glucose out of range, noted coffee-ground emesis and dark tarry stools. On arrival to ED, tachycardic to 130s, lethargic. Left foot noted to be dehisced with exposed bone, foul drainage and subcutaneous emphysema up the shin. Labs notable for lactic acid 5.4, Na 128, K 5.9, Cl 89, bicarb 16, BG 816, BUN 101, sCr 2.57, alk phos 127, Tbili 3.3, AG 23, WBC 31.4, Hgb 9, plt 440, INR 1.5, BHB 3.71, UA with trace leukocytes. Blood cultures were drawn and pending. CXR with no acute abnormality. Left foot xray with air around the stump and lower half of leg. Obvious concern for necrotizing fasciitis with septic shock, HHS.   He was given 3L LR fluid resuscitation, started on Zyvox  and Zosyn , insulin  drip. Requiring low dose levophed  despite fluid resuscitation. Orthopedic surgery consulted. Dr. Harden to take him to OR later today. 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 Harden, seen by ID, PCCM and orthopedics stabilized and transferred to hospitalist  service under my care on 04/01/2024.     Significant Hospital Events: Including procedures, antibiotic start and stop dates in addition to other pertinent events           9/10: ED for septic shock vs left foot wound and nec fasc, HHS. Ortho to take to OR, CCM to admit ICU  9/11 POD#1 s/p L AKA, wound vac in place. Extubated 03/31/2024.  Echocardiogram with bubble study.  1. Left ventricular ejection fraction, by estimation, is 70 to 75%. The left ventricle has hyperdynamic function. Left ventricular endocardial border not optimally defined to evaluate regional wall motion. There is moderate concentric left ventricular hypertrophy. Left ventricular diastolic parameters are consistent with Grade I diastolic dysfunction (impaired relaxation).  2. Right ventricular systolic function was not well visualized. The right ventricular size is normal.  3. The mitral valve is normal in structure. No evidence of mitral valve regurgitation. No evidence of mitral stenosis.  4. There is a bright mobile target on the ventricular side of the TV leaflets that is worrisome for vegetation. The tricuspid valve is abnormal.  5. The aortic valve is tricuspid. Aortic valve regurgitation is not visualized. Aortic valve sclerosis/calcification is present, without any evidence of aortic stenosis. Aortic valve area, by VTI measures 3.16 cm. Aortic valve mean gradient measures 5.0 mmHg. Aortic valve Vmax measures 1.34 m/s.  6. The inferior vena cava is normal in size with greater than 50% respiratory variability, suggesting right atrial pressure of 3 mmHg.     Subjective:    Fred Mcintosh today has, No headache, No chest pain, No abdominal pain - No Nausea, No new weakness tingling or numbness, no shortness of breath, generalized weakness.   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 Harden, has left AKA with a wound VAC, seen by ID as well, currently on IV Rocephin .  Echocardiogram shows questionable growth on tricuspid valve, will involve cardiology for TEE.   Acute Metabolic encephalopathy- due to #1 above, no headache or focal deficits, treat sepsis, appears dehydrated, will be hydrated, supportive care and monitor.  Dehydration, hypernatremia acute kidney injury- due to sepsis hydrate and monitor.  Trend is improving.  Elevated Tbili- f/u LFTs this am  Hypothyroidism- continue PTA synthroid , needs OP f/u  History of hypertension no acute issues continue to monitor  History of asthma no wheezing, supportive care.  Hx of infrequent medical care- complicating control of above  Physical deconditioning.  PT OT, may require placement   DM2 with hyperglycemia- variable PO needs have made transitioning off PO difficult  Lab Results  Component Value Date   HGBA1C 6.7 (H) 03/27/2024    CBG (last 3)  Recent Labs    04/01/24 0238 04/01/24 0628 04/01/24 0759  GLUCAP 194* 185* 184*         Condition - Extremely Guarded  Family Communication  : None present  Code Status : Full code  Consults  : PCCM, orthopedics, ID  PUD Prophylaxis :     Procedures  :            Disposition Plan  :    Status is: Inpatient   DVT Prophylaxis  :    SCD's Start: 03/27/24 1747 heparin  injection 5,000 Units Start: 03/27/24 1400    Lab Results  Component Value Date   PLT 138 (L) 04/01/2024    Diet :  Diet Order             Diet NPO time specified  Diet effective now                    Inpatient Medications  Scheduled Meds:  sodium chloride    Intravenous Once   acetaminophen   650 mg Per  Tube Q6H WA   vitamin C   1,000 mg Per Tube Daily   Chlorhexidine  Gluconate Cloth  6 each Topical Daily   docusate  100 mg Per Tube BID   feeding supplement (PROSource TF20)  60 mL Per Tube BID   free water   300  mL Per Tube Q4H   gabapentin   100 mg Per Tube Q8H   heparin   5,000 Units Subcutaneous Q8H   insulin  aspart  0-20 Units Subcutaneous Q4H   insulin  aspart  4 Units Subcutaneous Q4H   insulin  glargine  10 Units Subcutaneous Once   insulin  glargine  50 Units Subcutaneous Daily   leptospermum manuka honey  1 Application Topical Daily   levothyroxine   50 mcg Per Tube Q0600   nutrition supplement (JUVEN)  1 packet Per Tube BID BM   pantoprazole  (PROTONIX ) IV  40 mg Intravenous Q24H   polyethylene glycol  17 g Per Tube Daily   thiamine   100 mg Per Tube Daily   zinc  sulfate (50mg  elemental zinc )  220 mg Per Tube Daily   Continuous Infusions:  cefTRIAXone  (ROCEPHIN )  IV Stopped (03/31/24 1152)   dextrose      feeding supplement (VITAL 1.5 CAL) 45 mL/hr at 04/01/24 0423   PRN Meds:.albuterol , dextrose , fentaNYL  (SUBLIMAZE ) injection, hydrALAZINE , labetalol , methocarbamol  (ROBAXIN ) injection, ondansetron  (ZOFRAN ) IV, mouth rinse, oxyCODONE     Objective:   Vitals:   03/31/24 2342 04/01/24 0400 04/01/24 0500 04/01/24 0759  BP: (!) 151/72 131/71  (!) 141/68  Pulse: 98 91  98  Resp: 18     Temp: 97.9 F (36.6 C) 98.2 F (36.8 C)  98.5 F (36.9 C)  TempSrc: Axillary Oral  Axillary  SpO2: 100%   98%  Weight:   126.2 kg   Height:        Wt Readings from Last 3 Encounters:  04/01/24 126.2 kg  03/08/24 (!) 141.5 kg  01/18/24 (!) 153.3 kg     Intake/Output Summary (Last 24 hours) at 04/01/2024 0924 Last data filed at 04/01/2024 9366 Gross per 24 hour  Intake 1138.02 ml  Output 1850 ml  Net -711.98 ml     Physical Exam  Awake Alert, No new F.N deficits, Normal affect [place].AT,PERRAL Supple Neck, No JVD,   Symmetrical Chest wall movement, Good air movement bilaterally, CTAB RRR,No Gallops,Rubs or new Murmurs,  +ve B.Sounds, Abd Soft, No tenderness,   Left AKA stump site with wound VAC     Data Review:    Recent Labs  Lab 03/27/24 0909 03/27/24 1534 03/28/24 0421  03/28/24 1852 03/29/24 0508 03/30/24 0825 03/31/24 0838 04/01/24 0427  WBC 31.4*  --  18.9*  --  23.0* 13.6* 12.9* 10.6*  HGB 9.0*   < > 6.2* 9.2* 9.0* 9.0* 9.3* 8.7*  HCT 28.1*   < > 17.8* 26.3* 26.4* 27.0* 29.0* 27.9*  PLT 440*  --  193  --  153 160 152 138*  MCV 84.9  --  82.0  --  82.8 85.2 87.3 91.5  MCH 27.2  --  28.6  --  28.2 28.4 28.0 28.5  MCHC 32.0  --  34.8  --  34.1 33.3 32.1 31.2  RDW 18.2*  --  17.3*  --  17.0* 17.2* 17.2* 17.2*  LYMPHSABS 2.8  --   --   --   --   --   --   --   MONOABS 0.3  --   --   --   --   --   --   --  EOSABS 0.0  --   --   --   --   --   --   --   BASOSABS 0.0  --   --   --   --   --   --   --    < > = values in this interval not displayed.    Recent Labs  Lab 03/27/24 0909 03/27/24 0932 03/27/24 1031 03/27/24 1319 03/27/24 1529 03/27/24 1534 03/27/24 2041 03/28/24 0421 03/28/24 0717 03/29/24 0503 03/29/24 1802 03/29/24 2320 03/30/24 0825 03/30/24 1528 03/30/24 2223 03/31/24 0838 04/01/24 0427 04/01/24 0630  NA 128*  --   --  129*  --    < >  --  139  --  142  --    < > 140 144 144 147*  --  151*  K 5.9*  --   --  4.2  --    < >  --  3.8  --  3.6  --    < > 4.1 4.7 4.3 4.7  --  5.0  CL 89*  --   --  94*  --   --   --  99  --  102  --    < > 104 108 107 111  --  111  CO2 16*  --   --  18*  --   --   --  23  --  26  --    < > 25 28 25 26   --  27  ANIONGAP 23*  --   --  17*  --   --   --  17*  --  14  --    < > 11 8 12 10   --  13  GLUCOSE 816*  --   --  715*  --   --   --  314*  --  173*  --    < > 178* 193* 180* 187*  --  184*  BUN 101*  --   --  110*  --   --   --  92*  --  89*  --    < > 93* 95* 97* 91*  --  91*  CREATININE 2.57*  --   --  2.60*  --   --   --  2.19*  --  1.97*  --    < > 1.91* 1.83* 1.71* 1.59*  --  1.62*  AST 25  --   --   --   --   --  32  --   --  24  --   --  21  --   --  19  --  25  ALT 18  --   --   --   --   --  13  --   --  10  --   --  8  --   --  8  --  9  ALKPHOS 127*  --   --   --   --   --  94  --    --  69  --   --  70  --   --  75  --  71  BILITOT 3.3*  --   --   --   --   --  3.5*  --   --  2.4*  --   --  1.6*  --   --  1.2  --  0.9  ALBUMIN 1.7*  --   --   --   --   --  <  1.5*  --   --  <1.5*  --   --  <1.5*  --   --  1.5*  --  1.6*  CRP  --  37.6*  --   --   --   --   --   --  28.4*  --   --   --   --   --   --   --   --   --   PROCALCITON  --   --   --   --   --   --   --   --   --   --   --   --   --   --   --   --  13.19  --   LATICACIDVEN 5.4*  --  6.3* 7.6* 7.6*  --   --   --   --   --   --   --   --   --   --   --   --   --   INR 1.5*  --   --   --   --   --   --   --   --   --   --   --   --   --   --   --   --   --   TSH 6.747*  --   --   --   --   --  7.797*  --   --   --   --   --   --   --   --   --   --   --   HGBA1C  --   --   --  6.7*  --   --   --   --   --   --   --   --   --   --   --   --   --   --   AMMONIA  --   --   --  27  --   --   --   --   --   --  21  --   --   --   --   --   --   --   MG  --   --   --   --   --    < > 2.8* 2.6*  --  2.9*  --   --  2.9*  --   --  3.1* 3.1*  --   PHOS  --   --   --   --   --   --   --  3.5  --  4.1  --   --  2.9  --   --  2.8 3.5  --   CALCIUM  8.2*  --   --  8.2*  --   --   --  7.8*  --  7.9*  --    < > 7.9* 8.0* 8.1* 8.4*  --  8.7*   < > = values in this interval not displayed.      Recent Labs  Lab 03/27/24 [phone] 03/27/24 0932 03/27/24 1031 03/27/24 1319 03/27/24 1319 03/27/24 1529 03/27/24 2041 03/28/24 0421 03/28/24 9282 03/29/24 0503 03/29/24 1802 03/29/24 2320 03/30/24 0825 03/30/24 1528 03/30/24 2223 03/31/24 0838 04/01/24 0427 04/01/24 0630  CRP  --  37.6*  --   --   --   --   --   --  28.4*  --   --   --   --   --   --   --   --   --   PROCALCITON  --   --   --   --   --   --   --   --   --   --   --   --   --   --   --   --  13.19  --   LATICACIDVEN 5.4*  --  6.3* 7.6*  --  7.6*  --   --   --   --   --   --   --   --   --   --   --   --   INR 1.5*  --   --   --   --   --   --   --   --   --   --   --    --   --   --   --   --   --   TSH 6.747*  --   --   --   --   --  7.797*  --   --   --   --   --   --   --   --   --   --   --   HGBA1C  --   --   --  6.7*  --   --   --   --   --   --   --   --   --   --   --   --   --   --   AMMONIA  --   --   --  27  --   --   --   --   --   --  21  --   --   --   --   --   --   --   MG  --   --   --   --    < >  --  2.8* 2.6*  --  2.9*  --   --  2.9*  --   --  3.1* 3.1*  --   CALCIUM  8.2*  --   --  8.2*  --   --   --  7.8*  --  7.9*  --    < > 7.9* 8.0* 8.1* 8.4*  --  8.7*   < > = values in this interval not displayed.    --------------------------------------------------------------------------------------------------------------- Lab Results  Component Value Date   CHOL 166 01/18/2024   HDL 15 (L) 01/18/2024   LDLCALC 127 (H) 01/18/2024   TRIG 169 (H) 03/28/2024   CHOLHDL 11.1 01/18/2024    Lab Results  Component Value Date   HGBA1C 6.7 (H) 03/27/2024      Micro Results Recent Results (from the past 240 hours)  Blood Culture (routine x 2)     Status: None   Collection Time: 03/27/24  9:09 AM   Specimen: BLOOD  Result Value Ref Range Status   Specimen Description BLOOD BLOOD LEFT HAND  Final   Special Requests   Final    Blood Culture adequate volume BOTTLES DRAWN AEROBIC AND ANAEROBIC   Culture   Final    NO GROWTH 5 DAYS Performed at [facility], [address], [place], [place] [address]    Report Status 04/01/2024 FINAL  Final  Blood Culture (  routine x 2)     Status: Abnormal   Collection Time: 03/27/24  9:10 AM   Specimen: BLOOD  Result Value Ref Range Status   Specimen Description   Final    BLOOD BLOOD RIGHT HAND Performed at [facility], [address] [place], [place] [address]    Special Requests   Final    Blood Culture results may not be optimal due to an inadequate volume of blood received in culture bottles BOTTLES DRAWN AEROBIC AND ANAEROBIC Performed at [facility], [address], [place], [place] [address]     Culture  Setup Time   Final    AEROBIC BOTTLE ONLY GRAM POSITIVE COCCI IN CHAINS Gram Stain Report Called to,Read Back By and Verified With: E GANT AT 0952 ON 09.11.25 BY ADGER J  CRITICAL RESULT CALLED TO, READ BACK BY AND VERIFIED WITH: PHARMD BLAKE WANNARAT ON 03/28/24 @ 1506 BY DRT Performed at [facility], [address], [place], [place] [address]    Culture STREPTOCOCCUS ANGINOSIS (A)  Final   Report Status 03/31/2024 FINAL  Final   Organism ID, Bacteria STREPTOCOCCUS ANGINOSIS  Final      Susceptibility   Streptococcus anginosis - MIC*    PENICILLIN  <=0.06 SENSITIVE Sensitive     CEFTRIAXONE  <=0.12 SENSITIVE Sensitive     ERYTHROMYCIN <=0.12 SENSITIVE Sensitive     LEVOFLOXACIN  <=0.25 SENSITIVE Sensitive     VANCOMYCIN  0.5 SENSITIVE Sensitive     * STREPTOCOCCUS ANGINOSIS  Blood Culture ID Panel (Reflexed)     Status: Abnormal   Collection Time: 03/27/24  9:10 AM  Result Value Ref Range Status   Enterococcus faecalis NOT DETECTED NOT DETECTED Final   Enterococcus Faecium NOT DETECTED NOT DETECTED Final   Listeria monocytogenes NOT DETECTED NOT DETECTED Final   Staphylococcus species NOT DETECTED NOT DETECTED Final   Staphylococcus aureus (BCID) NOT DETECTED NOT DETECTED Final   Staphylococcus epidermidis NOT DETECTED NOT DETECTED Final   Staphylococcus lugdunensis NOT DETECTED NOT DETECTED Final   Streptococcus species DETECTED (A) NOT DETECTED Final    Comment: Not Enterococcus species, Streptococcus agalactiae, Streptococcus pyogenes, or Streptococcus pneumoniae. CRITICAL RESULT CALLED TO, READ BACK BY AND VERIFIED WITH: PHARMD BLAKE WANNARAT ON 03/28/24 @ 1506 BY DRT    Streptococcus agalactiae NOT DETECTED NOT DETECTED Final   Streptococcus pneumoniae NOT DETECTED NOT DETECTED Final   Streptococcus pyogenes NOT DETECTED NOT DETECTED Final   A.calcoaceticus-baumannii NOT DETECTED NOT DETECTED Final   Bacteroides fragilis NOT DETECTED NOT DETECTED Final    Enterobacterales NOT DETECTED NOT DETECTED Final   Enterobacter cloacae complex NOT DETECTED NOT DETECTED Final   Escherichia coli NOT DETECTED NOT DETECTED Final   Klebsiella aerogenes NOT DETECTED NOT DETECTED Final   Klebsiella oxytoca NOT DETECTED NOT DETECTED Final   Klebsiella pneumoniae NOT DETECTED NOT DETECTED Final   Proteus species NOT DETECTED NOT DETECTED Final   Salmonella species NOT DETECTED NOT DETECTED Final   Serratia marcescens NOT DETECTED NOT DETECTED Final   Haemophilus influenzae NOT DETECTED NOT DETECTED Final   Neisseria meningitidis NOT DETECTED NOT DETECTED Final   Pseudomonas aeruginosa NOT DETECTED NOT DETECTED Final   Stenotrophomonas maltophilia NOT DETECTED NOT DETECTED Final   Candida albicans NOT DETECTED NOT DETECTED Final   Candida auris NOT DETECTED NOT DETECTED Final   Candida glabrata NOT DETECTED NOT DETECTED Final   Candida krusei NOT DETECTED NOT DETECTED Final   Candida parapsilosis NOT DETECTED NOT DETECTED Final   Candida tropicalis NOT  DETECTED NOT DETECTED Final   Cryptococcus neoformans/gattii NOT DETECTED NOT DETECTED Final    Comment: Performed at [facility], [address] [place], [place] [address]  MRSA Next Gen by PCR, Nasal     Status: None   Collection Time: 03/27/24 12:02 PM   Specimen: Nasal Mucosa; Nasal Swab  Result Value Ref Range Status   MRSA by PCR Next Gen NOT DETECTED NOT DETECTED Final    Comment: (NOTE) The GeneXpert MRSA Assay (FDA approved for NASAL specimens only), is one component of a comprehensive MRSA colonization surveillance program. It is not intended to diagnose MRSA infection nor to guide or monitor treatment for MRSA infections. Test performance is not FDA approved in patients less than 58 years old. Performed at [facility], [address] [place], [place] [address]   Culture, blood (Routine X 2) w Reflex to ID Panel     Status: None (Preliminary result)   Collection Time: 03/29/24  10:52 AM   Specimen: BLOOD LEFT HAND  Result Value Ref Range Status   Specimen Description BLOOD LEFT HAND  Final   Special Requests   Final    BOTTLES DRAWN AEROBIC AND ANAEROBIC Blood Culture results may not be optimal due to an inadequate volume of blood received in culture bottles   Culture   Final    NO GROWTH 2 DAYS Performed at [facility], [address], [place], [place] [address]    Report Status PENDING  Incomplete  Culture, blood (Routine X 2) w Reflex to ID Panel     Status: None (Preliminary result)   Collection Time: 03/29/24 10:52 AM   Specimen: BLOOD LEFT HAND  Result Value Ref Range Status   Specimen Description BLOOD LEFT HAND  Final   Special Requests   Final    BOTTLES DRAWN AEROBIC AND ANAEROBIC Blood Culture results may not be optimal due to an inadequate volume of blood received in culture bottles   Culture   Final    NO GROWTH 2 DAYS Performed at [facility], [address] [place], [place] [address]    Report Status PENDING  Incomplete    Radiology Report ECHOCARDIOGRAM COMPLETE BUBBLE STUDY Result Date: 03/31/2024    ECHOCARDIOGRAM REPORT   Patient Name:   Fred Mcintosh Date of Exam: 03/31/2024 Medical Rec #:  [id number]          Height:       74.0 in Accession #:    [id number]         Weight:       280.4 lb Date of Birth:  15-May-1961         BSA:          2.509 m Patient Age:    62 years           BP:           150/80 mmHg Patient Gender: M                  HR:           95 bpm. Exam Location:  Inpatient Procedure: 2D Echo, Cardiac Doppler, Color Doppler and Intracardiac            Opacification Agent (Both Spectral and Color Flow Doppler were            utilized during procedure). Indications:    Endocarditis  History:        Patient has  no prior history of Echocardiogram examinations.                 Risk Factors:Hypertension and Diabetes.  Sonographer:    Jayson Gaskins Referring Phys: [id number] [facility] IMPRESSIONS  1. Left ventricular  ejection fraction, by estimation, is 70 to 75%. The left ventricle has hyperdynamic function. Left ventricular endocardial border not optimally defined to evaluate regional wall motion. There is moderate concentric left ventricular hypertrophy. Left ventricular diastolic parameters are consistent with Grade I diastolic dysfunction (impaired relaxation).  2. Right ventricular systolic function was not well visualized. The right ventricular size is normal.  3. The mitral valve is normal in structure. No evidence of mitral valve regurgitation. No evidence of mitral stenosis.  4. There is a bright mobile target on the ventricular side of the TV leaflets that is worrisome for vegetation.. The tricuspid valve is abnormal.  5. The aortic valve is tricuspid. Aortic valve regurgitation is not visualized. Aortic valve sclerosis/calcification is present, without any evidence of aortic stenosis. Aortic valve area, by VTI measures 3.16 cm. Aortic valve mean gradient measures 5.0 mmHg. Aortic valve Vmax measures 1.34 m/s.  6. The inferior vena cava is normal in size with greater than 50% respiratory variability, suggesting right atrial pressure of 3 mmHg. Conclusion(s)/Recommendation(s): Findings concerning for tricuspid valve vegetation, would recommend Transesophageal Echocardiogram for clarification. FINDINGS  Left Ventricle: Left ventricular ejection fraction, by estimation, is 70 to 75%. The left ventricle has hyperdynamic function. Left ventricular endocardial border not optimally defined to evaluate regional wall motion. The left ventricular internal cavity size was normal in size. There is moderate concentric left ventricular hypertrophy. Left ventricular diastolic parameters are consistent with Grade I diastolic dysfunction (impaired relaxation). Normal left ventricular filling pressure. Right Ventricle: The right ventricular size is normal. No increase in right ventricular wall thickness. Right ventricular systolic  function was not well visualized. Left Atrium: Left atrial size was normal in size. Right Atrium: Right atrial size was normal in size. Pericardium: There is no evidence of pericardial effusion. Mitral Valve: The mitral valve is normal in structure. No evidence of mitral valve regurgitation. No evidence of mitral valve stenosis. Tricuspid Valve: There is a bright mobile target on the ventricular side of the TV leaflets that is worrisome for vegetation. The tricuspid valve is abnormal. Tricuspid valve regurgitation is not demonstrated. No evidence of tricuspid stenosis. Aortic Valve: The aortic valve is tricuspid. Aortic valve regurgitation is not visualized. Aortic valve sclerosis/calcification is present, without any evidence of aortic stenosis. Aortic valve mean gradient measures 5.0 mmHg. Aortic valve peak gradient measures 7.2 mmHg. Aortic valve area, by VTI measures 3.16 cm. Pulmonic Valve: The pulmonic valve was normal in structure. Pulmonic valve regurgitation is not visualized. No evidence of pulmonic stenosis. Aorta: The aortic root is normal in size and structure. Venous: The inferior vena cava is normal in size with greater than 50% respiratory variability, suggesting right atrial pressure of 3 mmHg. IAS/Shunts: No atrial level shunt detected by color flow Doppler.  LEFT VENTRICLE PLAX 2D LVIDd:         3.70 cm   Diastology LVIDs:         1.70 cm   LV e' medial:    7.07 cm/s LV PW:         1.20 cm   LV E/e' medial:  9.4 LV IVS:        1.40 cm   LV e' lateral:   7.29 cm/s LVOT diam:  2.00 cm   LV E/e' lateral: 9.1 LV SV:         74 LV SV Index:   29 LVOT Area:     3.14 cm  RIGHT VENTRICLE RV S prime:     25.20 cm/s LEFT ATRIUM           Index        RIGHT ATRIUM           Index LA Vol (A4C): 55.5 ml 22.12 ml/m  RA Area:     17.60 cm                                    RA Volume:   48.60 ml  19.37 ml/m  AORTIC VALVE AV Area (Vmax):    3.00 cm AV Area (Vmean):   2.99 cm AV Area (VTI):     3.16 cm  AV Vmax:           134.00 cm/s AV Vmean:          103.000 cm/s AV VTI:            0.233 m AV Peak Grad:      7.2 mmHg AV Mean Grad:      5.0 mmHg LVOT Vmax:         128.00 cm/s LVOT Vmean:        97.900 cm/s LVOT VTI:          0.234 m LVOT/AV VTI ratio: 1.00  AORTA Ao Root diam: 3.40 cm MITRAL VALVE MV Area (PHT): 2.75 cm    SHUNTS MV Decel Time: 276 msec    Systemic VTI:  0.23 m MV E velocity: 66.60 cm/s  Systemic Diam: 2.00 cm MV A velocity: 88.60 cm/s MV E/A ratio:  0.75 Wilbert Bihari MD Electronically signed by Wilbert Bihari MD Signature Date/Time: 03/31/2024/11:55:49 AM    Final      Signature  -   Lavada Stank M.D on 04/01/2024 at 9:24 AM   -  To page go to [email or web address]

## 2024-04-01 NOTE — Progress Notes (Signed)
 Speech Language Pathology Treatment: Dysphagia  Patient Details Name: Fred Mcintosh MRN: [id number] DOB: 14-Jan-1961 Today's Date: 04/01/2024 Time: 9063-9049 SLP Time Calculation (min) (ACUTE ONLY): 14 min  Assessment / Plan / Recommendation Clinical Impression  Pt's mentation continues to be a barrier to initiating a PO diet. He is disoriented to time, place, and situation but follows commands consistently. He has difficulty sustaining his level of alertness across a session and needs cueing to attend to POs despite asking for them repeatedly. Cueing is needed to sequence each step needed to accept POs and initiate a swallow response. This contributes to prolonged oral holding. Thin liquids via cup and straw result in immediate coughing. Due to the need for such extensive cueing, it subjectively appears as if he is having difficulty coordinating his respiratory pattern with holding the bolus in his mouth and then swallowing. He is not currently demonstrating readiness for a PO diet, recommend NPO except ice chips when alert after oral care. Cortrak is in place for reliable medication administration. SLP will f/u.     HPI HPI: 63 yo male admitted 9/10 with AMS and weakness after not getting OOB for 9 days. 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 ETT 9/10-9/11. PMH includes: recent L foot amputation, DM, HTN, asthma, Graves disease      SLP Plan  Continue with current plan of care          Recommendations  Diet recommendations: NPO Medication Administration: Via alternative means                  Oral care QID;Oral care prior to ice chip/H20   Frequent or constant Supervision/Assistance Dysphagia, unspecified (R13.10)     Continue with current plan of care     Damien Blumenthal, M.A., CCC-SLP Speech Language Pathology, Acute Rehabilitation Services  Secure Chat preferred [phone]   04/01/2024, 9:59 AM

## 2024-04-01 NOTE — Progress Notes (Addendum)
 Nutrition Follow-up  DOCUMENTATION CODES:   Not applicable  INTERVENTION:  Continue tube feeding via Cortrak: Vital 1.5 at 65 ml/h (1560 ml per day) NOTE: Patient not yet up to goal rate - noted to be at 67ml/hr x2 days w/ no documented reason as to why he has not been titrated up Prosource TF20 60 ml BID FWF 300ml q4h, per MD (2400 ml per day)  Provides 2500 kcal, 145 gm protein, free water  daily  Continue 100mg  thiamine  x 2 more days  1 packet Juven BID, each packet provides 95 calories, 2.5 grams of protein (collagen), and 9.8 grams of carbohydrate (3 grams sugar); also contains 7 grams of L-arginine and L-glutamine, 300 mg vitamin C , 15 mg vitamin E, 1.2 mcg vitamin B-12, 9.5 mg zinc , 200 mg calcium , and 1.5 g  Calcium  Beta-hydroxy-Beta-methylbutyrate to support wound healing  Per MD, vitamin C  1000 mg x 30 days and 220mg  of zinc    Monitor SLP notes for diet advancement and tolerance  NUTRITION DIAGNOSIS:  Increased nutrient needs related to wound healing as evidenced by estimated needs. - remains applicable  GOAL:  Patient will meet greater than or equal to 90% of their needs - progressing  MONITOR:  Diet advancement, I & O's, Labs, Skin  REASON FOR ASSESSMENT:  Ventilator, Consult Enteral/tube feeding initiation and management  ASSESSMENT:  Pt with hx of HTN, DM type 2, grave's disease, and recent transmetatarsal amputation presented to ED with AMS and weakness. Family reports pt not getting out of bed x 9 days. In ED, left foot noted to be dehisced with exposed bone, foul drainage and subcutaneous emphysema up the shin.  9/10 - presented to ED, Op, left AKA with wound vac placement  9/11 - TF started; extubated 9/12 - Cortrak placed and TF re-initiated 9/14 - Echocardiogram w/ bubble study: Left ventricular  hypertrophy; EF 70-75%; aortic valve sclerosis/calcification 9/15 - THR assumed care 9/16 - TEE scheduled   Patient resting in bed at time of visit  today. No family at bedside. He continues with AMS and garbled speech. Remains NPO, per speech.  Noted patient is only running at 58ml/hr. Unsure as to why this is as there is no documented reason. This regimen currently provides 1780 kcals and 113g protein per day, which corresponds to 74% and 94% of minimum estimated needs, respectively.    Discussed with MD and RN. RN reports tube feeds to be turned off at midnight for scheduled TEE tomorrow and MD preferring to leave as is due to risk of aspiration. Of note, his estimated needs are significantly elevated d/t body habitus, presence of muscle wasting, infection and significant wounds.   Still unable to obtain nutrition-related history. Likely he does meet criteria for malnutrition as he was reportedly in bed for >9 days prior to admission. Unable to ascertain level of intake during that time. Weight trend stable as well as fat stores.   Admit weight: 128.5 kg (first measured)  Current weight: 126.2 kg  +generalized mild, pitting edema   Wt stable this admission s/p L AKA. Some mild edema noted, generalized in nature.  Bowels moving. Monitor need for modification to bowel regimen.    Intake/Output Summary (Last 24 hours) at 04/01/2024 1924 Last data filed at 04/01/2024 1609 Gross per 24 hour  Intake 1800 ml  Output 1450 ml  Net 350 ml    Net IO Since Admission: 10,599.79 mL [04/01/24 1924]    Drains/Lines: Cortrak (gastric) placed 9/12 Foley catheter UOP x  24 hours Would Vac, left AKA site: 0 mL output documented  Remains on vitamin/mineral regimen to promote wound healing. Refeeding labs stable. Sodium trending up. MD adjusted free water  flushes.   Meds: ascorbic acid , docusate, SS Novolog  0-20 q4h, SS novolog  4 q4h, Lantus  50 daily, levothyroxine , pantoprazole , Miralax , thiamine , zinc  sulfate  Drips: IV ABX D5W @ 11ml/hr  Labs:  Na+ 144>147>151 (H) K+ 5.0 (wdl) Crt 1.71>1.59>1.62 (H) PHOS 3.5 (wdl) Mg 3.1 (H) CRP  37.6>28.4>2.9 (H) WBC 13.6>12.9>10.6 (H) TSH 7.797 (H) T4 2.9 (L) CBGs >700--->184-187 x24 hours A1c 6.7 (03/2024)   Diet Order:   Diet Order             Diet NPO time specified  Diet effective midnight           Diet NPO time specified  Diet effective now            EDUCATION NEEDS:  Not appropriate for education at this time  Skin:  Skin Assessment: Skin Integrity Issues:  Last BM:  9/15 - type 6 x1 (small)  Height:  Ht Readings from Last 1 Encounters:  03/27/24 6' 2 (1.88 m)   Weight:  Wt Readings from Last 1 Encounters:  04/01/24 126.2 kg   Ideal Body Weight:  77.7 kg (adjusted by 10% for AKA)  BMI:  Body mass index is 35.72 kg/m.  Estimated Nutritional Needs:   Kcal:  2400-2600 kcal/d  Protein:  120-145 g/d  Fluid:  2.5L/d  Blair Deaner MS, RD, LDN Registered Dietitian Clinical Nutrition RD Inpatient Contact Info in Amion

## 2024-04-01 NOTE — Progress Notes (Addendum)
   [facility] has been requested to perform a transesophageal echocardiogram on Fred Mcintosh for bacteremia.    The patient does NOT have any absolute or relative contraindications to a Transesophageal Echocardiogram (TEE).  The patient has: No other conditions that may impact this procedure.    After careful review of history and examination, the risks and benefits of transesophageal echocardiogram have been explained including risks of esophageal damage, perforation (1:10,000 risk), bleeding, pharyngeal hematoma as well as other potential complications associated with conscious sedation including aspiration, arrhythmia, respiratory failure and death. Alternatives to treatment were discussed, questions were answered. Patient is willing to proceed.   Due to patients mental status at this time I have also called the patient's brother, [name], to discuss this procedure.[name] tells me that during this admission he has been assigned to make any medical decisions on his brothers behalf. I explained the procedure in depth with his brother after speaking with the patient. Both parties agreed to proceed. All questions were answered   Orders are also placed to hold tube feeds starting 0001 on 9/16  Signed, Waddell DELENA Donath, PA-C  04/01/2024 3:41 PM

## 2024-04-01 NOTE — Inpatient Diabetes Management (Signed)
 Inpatient Diabetes Program Recommendations  AACE/ADA: New Consensus Statement on Inpatient Glycemic Control (2015)  Target Ranges:  Prepandial:   less than 140 mg/dL      Peak postprandial:   less than 180 mg/dL (1-2 hours)      Critically ill patients:  140 - 180 mg/dL   Lab Results  Component Value Date   GLUCAP 230 (H) 04/01/2024   HGBA1C 6.7 (H) 03/27/2024    Review of Glycemic Control  Latest Reference Range & Units 03/31/24 12:29 03/31/24 15:25 03/31/24 21:09 04/01/24 02:38 04/01/24 06:28 04/01/24 07:59 04/01/24 11:58  Glucose-Capillary 70 - 99 mg/dL 747 (H) 728 (H) 765 (H) 194 (H) 185 (H) 184 (H) 230 (H)   Diabetes history: DM Outpatient Diabetes medications:  Metformin  1000 mg bid Current orders for Inpatient glycemic control:  Novolog  0-20 units q 4 hours Lantus  50 units daily Vital 65 ml/hr Novolog  4 units q 4 hours Inpatient Diabetes Program Recommendations:    Consider increasing Novolog  tube feed coverage to 6 units q 4 hours.   Thanks,  Randall Bullocks, RN, BC-ADM Inpatient Diabetes Coordinator Pager [phone]  (8a-5p)

## 2024-04-01 NOTE — Evaluation (Signed)
 Occupational Therapy Evaluation Patient Details Name: Fred Mcintosh MRN: [id number] DOB: Dec 14, 1960 Today's Date: 04/01/2024   History of Present Illness   63 y.o. 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 PMHx: Lt TMA 03/08/24, T2DM, HTN, obesity, Graves' disease     Clinical Impressions Pt was having difficulty maintaining NWB on R foot prior since his L TMA, but able to mobilize with RW and CGA. He needed up to mod assist for ADLs. PLOF gathered from chart review. Pt presents with lethargy. He repeatedly requests ice despite being educated on NPO status. Pt is disoriented except to himself. When told the month, pt perseverated. Presents with generalized weakness. He needs +2 total assist for rolling for pericare and linen change. He is dependent in all ADLs. Patient will benefit from continued inpatient follow up therapy, <3 hours/day.     If plan is discharge home, recommend the following:   Two people to help with walking and/or transfers;Two people to help with bathing/dressing/bathroom;Assistance with cooking/housework;Assistance with feeding;Direct supervision/assist for medications management;Direct supervision/assist for financial management;Assist for transportation;Help with stairs or ramp for entrance     Functional Status Assessment   Patient has had a recent decline in their functional status and/or demonstrates limited ability to make significant improvements in function in a reasonable and predictable amount of time     Equipment Recommendations   Other (comment) (defer)     Recommendations for Other Services         Precautions/Restrictions   Precautions Precautions: Fall;Other (comment) (L LE wound vac) Recall of Precautions/Restrictions: Impaired Precaution/Restrictions Comments: cortrak Restrictions Weight Bearing Restrictions Per Provider  Order: Yes LLE Weight Bearing Per Provider Order: Non weight bearing     Mobility Bed Mobility Overal bed mobility: Needs Assistance Bed Mobility: Rolling Rolling: +2 for physical assistance, Total assist         General bed mobility comments: assist for all aspects, no initiation    Transfers                   General transfer comment: will require maximove      Balance                                           ADL either performed or assessed with clinical judgement   ADL                                         General ADL Comments: dependent     Vision   Vision Assessment?: No apparent visual deficits     Perception         Praxis         Pertinent Vitals/Pain Pain Assessment Pain Assessment: Faces Faces Pain Scale: Hurts even more Pain Location: L LE Pain Descriptors / Indicators: Grimacing, Guarding Pain Intervention(s): Monitored during session, Repositioned     Extremity/Trunk Assessment Upper Extremity Assessment Upper Extremity Assessment: Generalized weakness;Right hand dominant       Cervical / Trunk Assessment Cervical / Trunk Assessment: Other exceptions (weakness) Cervical / Trunk Exceptions: obesity   Communication Communication Communication: Impaired Factors Affecting Communication: Reduced clarity of speech   Cognition Arousal: Lethargic Behavior During  Therapy: Flat affect Cognition: Cognition impaired   Orientation impairments: Place, Time, Situation Awareness: Intellectual awareness impaired, Online awareness impaired Memory impairment (select all impairments): Short-term memory, Working [employer] fast streamer, Non-declarative long-term memory, Geneticist, molecular long-term memory Attention impairment (select first level of impairment): Focused attention Executive functioning impairment (select all impairments): Initiation, Organization, Sequencing, Reasoning, Problem solving OT - Cognition Comments:  pt with poor immediate memory, repeatedly asking for ice after being told he is having trouble swallowing                 Following commands: Impaired Following commands impaired: Follows one step commands inconsistently     Cueing  General Comments   Cueing Techniques: Verbal cues;Visual cues;Tactile cues      Exercises     Shoulder Instructions      Home Living Family/patient expects to be discharged to:: Skilled nursing facility Living Arrangements: Parent;Other relatives Available Help at Discharge: Family;Available 24 hours/day Type of Home: House Home Access: Stairs to enter [employer] of Steps: 5 Entrance Stairs-Rails: Right;Left;Can reach both Home Layout: One level     Bathroom Shower/Tub: Producer, television/film/video: Handicapped height     Home Equipment: Grab bars - tub/shower;Shower seat;Cane - single Librarian, academic (2 wheels)          Prior Functioning/Environment Prior Level of Function : Independent/Modified Independent;Driving               ADLs Comments: Indep with ADLs/IADLs. 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 prior to last admission    OT Problem List: Decreased strength;Decreased activity tolerance;Impaired balance (sitting and/or standing);Decreased coordination;Decreased cognition;Decreased safety awareness;Decreased knowledge of use of DME or AE;Obesity;Pain;Impaired UE functional use   OT Treatment/Interventions: Self-care/ADL training;Therapeutic activities;Cognitive remediation/compensation;Patient/family education;Balance training      OT Goals(Current goals can be found in the care plan section)   Acute Rehab OT Goals OT Goal Formulation: Patient unable to participate in goal setting Time For Goal Achievement: 04/15/24 Potential to Achieve Goals: Fair ADL Goals Pt/caregiver will Perform Home Exercise Program: Increased strength;Both right and  left upper extremity;With minimal assist (A/AROM) Additional ADL Goal #1: Pt will perform hand to face/mouth as a precursor to self feeding and grooming. Additional ADL Goal #2: Pt will roll with mod assist for positioning and pericare. Additional ADL Goal #3: Pt will demonstrate sustained attention as a precursor to ADLs.   OT Frequency:  Min 2X/week    Co-evaluation              AM-PAC OT 6 Clicks Daily Activity     Outcome Measure Help from another person eating meals?: Total Help from another person taking care of personal grooming?: Total Help from another person toileting, which includes using toliet, bedpan, or urinal?: Total Help from another person bathing (including washing, rinsing, drying)?: Total Help from another person to put on and taking off regular upper body clothing?: Total Help from another person to put on and taking off regular lower body clothing?: Total 6 Click Score: 6   End of Session    Activity Tolerance: Patient limited by lethargy;Patient limited by fatigue Patient left: in bed;with call bell/phone within reach;with bed alarm set  OT Visit Diagnosis: Muscle weakness (generalized) (M62.81);Other symptoms and signs involving cognitive function;Pain                Time: 8785-8769 OT Time Calculation (min): 16 min Charges:  OT General Charges $OT Visit:  1 Visit OT Evaluation $OT Eval Moderate Complexity: 1 Mod  Mliss HERO, OTR/L Acute Rehabilitation Services Office: [phone]   Kennth Mliss Helling 04/01/2024, 1:12 PM

## 2024-04-01 NOTE — TOC Progression Note (Addendum)
 Transition of Care [facility]) - Progression Note    Patient Details  Name: Fred Mcintosh MRN: [id number] Date of Birth: 02-Dec-1960  Transition of Care [facility]) CM/SW Contact  Inocente GORMAN Kindle, LCSW Phone Number: 04/01/2024, 2:06 PM  Clinical Narrative:    CSW contacted [facility] to obtain contact info for Janine who is reported to be following patient. CSW was able to speak with Roanne who is following the patient ([phone]). She stated she is going to come visit patient today for an assessment as she has been unable to. CSW made her aware that CSW was able to speak with patient's brother, [name], since mother's number does not work. [name] stated there is not another number but that patient does live with their mother. [name] stated hospital can call him for decisions needed. CSW spoke with him about SNF versus LTACH recommendation per MD. CSW discussed insurance authorization process and will provide Medicare SNF ratings list. [name] reported understanding.   CSW requested Select review referral for LTACH candidacy prior to offering family choice.    Skilled Nursing Rehab Facilities-   [email or web address]   Ratings out of 5 stars (5 the highest)  Name Address  Phone # Quality Care Staffing Health Inspection Overall  [facility] [address], [place] [phone] 2 1 2 1   [facility] [address], [place] [phone] 5 2 4 5   [facility] [address], [facility] [phone] 2 1 1 1   [facility] [address], [place] [phone] 4 3 4 4   [facility]  [address], [place] [phone] 4 3 5 5   [facility] [address] [facility] [phone] 5 3 2 3   [facility] [address], [place] [phone] 5 1 2 2   [facility] [phone] [place] [phone] 3 4 4 4   [address] ([place]) [address], [place] [phone] 1 3 3 2    [facility] [address] [place], [place] [phone] 4 2 2 2   [facility] ([place]) [address] [place], [place] [phone] 2 1 1 1   [facility]ot[facility] Pereyra [address] [place] [phone] 2 4 4 4   [facility] [address] [place] [phone] 3 1 3 2   [facility]) [address], [place] [phone] 1 2 4 3           [facility] [address] [place] [phone] 3 1 5 4   [facility] [address], [place] [phone] 4 2 1 1   [facility]  [address] [place] [phone] 2 4 1 1   [facility] [address] [phone] 2 2 5 5   [facility] [address], [place] [phone] 2 2 3 3           [facility] [address] [place] [phone] 2 1 2 1   [facility] (No [facility]) [address], [place] [phone] 4 3 4 4   [facility] [address] [facility] [phone] 3  5 5   [facility] [address], [place] [phone] 4 2 1 1   [facility] [address] [place] [phone] 3 1 2 1   [facility] [address], [place] [phone] 1 3 3 2   [facility] [address] [place] [phone] 2 2 3 3   [facility] [address] [place], [place] [phone] 2 1 4 3   [facility] [address], [place] [phone] 2  1 1 1   [facility] [address], [place] [phone] 2 1 2 1   [facility] [address] [place] [phone] 3 1 2 1           [facility] [address] [place] [phone] 4 1 3 2   [place] [address], [place]  [phone] 2 4 4 4   [facility] (No Hum[employer]) [address], [place] [phone] 3 2 5 5   [facility]) [address], [place] [phone] 3 2 3 3   [facility] [address] [place] [phone] 5 3 5 5   [facility] [address], [place] [phone] 2 1 1 1   [facility] [address] [place], [place] [phone] 3 5 5 5           [facility] [address] [place], [place] [phone] 5 4 5 5   [facility])  [address] [place] [phone] 1 1 2 1   [facility]) [address] [place] [phone]  2 4 4   [facility] [address], [place] [phone] 3 5 5 5   [address] [place], [place] [phone] 4 2 2 2   [facility]) [address] [place] [phone] 1 1 3 1   [facility] [address], [place] [phone] 2 2 2 2         Barriers to Discharge: ED Claims of Negligence on the Part of Facility/Family               Expected Discharge Plan and Services In-house Referral: Clinical Social Work     Living arrangements for the past 2 months: Single Family Home                                       Social Drivers of Health (SDOH) Interventions SDOH Screenings   Food Insecurity: No Food Insecurity (03/08/2024)  Housing: Low Risk  (03/08/2024)  Transportation Needs: No Transportation Needs (03/08/2024)  Utilities: Not At Risk (03/08/2024)  Social Connections: Moderately Integrated (01/18/2024)  Tobacco Use: Unknown (03/27/2024)    Readmission Risk Interventions     No data to display

## 2024-04-02 ENCOUNTER — Encounter (HOSPITAL_COMMUNITY)
Admission: EM | Disposition: A | Discharge status: Skilled Nursing Facility | Payer: Self-pay | Source: Home / Self Care | Attending: Internal Medicine

## 2024-04-02 ENCOUNTER — Inpatient Hospital Stay (HOSPITAL_COMMUNITY)

## 2024-04-02 DIAGNOSIS — I38 Endocarditis, valve unspecified: Secondary | ICD-10-CM | POA: Diagnosis not present

## 2024-04-02 DIAGNOSIS — E66813 Obesity, class 3: Secondary | ICD-10-CM | POA: Diagnosis not present

## 2024-04-02 DIAGNOSIS — E119 Type 2 diabetes mellitus without complications: Secondary | ICD-10-CM | POA: Diagnosis not present

## 2024-04-02 DIAGNOSIS — B955 Unspecified streptococcus as the cause of diseases classified elsewhere: Secondary | ICD-10-CM

## 2024-04-02 DIAGNOSIS — R7881 Bacteremia: Secondary | ICD-10-CM | POA: Diagnosis not present

## 2024-04-02 DIAGNOSIS — I10 Essential (primary) hypertension: Secondary | ICD-10-CM

## 2024-04-02 DIAGNOSIS — M726 Necrotizing fasciitis: Secondary | ICD-10-CM | POA: Diagnosis not present

## 2024-04-02 HISTORY — PX: TRANSESOPHAGEAL ECHOCARDIOGRAM (CATH LAB): EP1270

## 2024-04-02 LAB — CBC WITH DIFFERENTIAL/PLATELET
Abs Immature Granulocytes: 0.29 K/uL — ABNORMAL HIGH (ref 0.00–0.07)
Basophils Absolute: 0 K/uL (ref 0.0–0.1)
Basophils Relative: 0 %
Eosinophils Absolute: 0.1 K/uL (ref 0.0–0.5)
Eosinophils Relative: 1 %
HCT: 28.5 % — ABNORMAL LOW (ref 39.0–52.0)
Hemoglobin: 8.6 g/dL — ABNORMAL LOW (ref 13.0–17.0)
Immature Granulocytes: 3 %
Lymphocytes Relative: 14 %
Lymphs Abs: 1.6 K/uL (ref 0.7–4.0)
MCH: 28.5 pg (ref 26.0–34.0)
MCHC: 30.2 g/dL (ref 30.0–36.0)
MCV: 94.4 fL (ref 80.0–100.0)
Monocytes Absolute: 0.5 K/uL (ref 0.1–1.0)
Monocytes Relative: 5 %
Neutro Abs: 8.9 K/uL — ABNORMAL HIGH (ref 1.7–7.7)
Neutrophils Relative %: 77 %
Platelets: 142 K/uL — ABNORMAL LOW (ref 150–400)
RBC: 3.02 MIL/uL — ABNORMAL LOW (ref 4.22–5.81)
RDW: 17.5 % — ABNORMAL HIGH (ref 11.5–15.5)
WBC: 11.4 K/uL — ABNORMAL HIGH (ref 4.0–10.5)
nRBC: 0.8 % — ABNORMAL HIGH (ref 0.0–0.2)

## 2024-04-02 LAB — COMPREHENSIVE METABOLIC PANEL WITH GFR
ALT: 11 U/L (ref 0–44)
AST: 18 U/L (ref 15–41)
Albumin: 1.6 g/dL — ABNORMAL LOW (ref 3.5–5.0)
Alkaline Phosphatase: 67 U/L (ref 38–126)
Anion gap: 9 (ref 5–15)
BUN: 72 mg/dL — ABNORMAL HIGH (ref 8–23)
CO2: 26 mmol/L (ref 22–32)
Calcium: 8.5 mg/dL — ABNORMAL LOW (ref 8.9–10.3)
Chloride: 116 mmol/L — ABNORMAL HIGH (ref 98–111)
Creatinine, Ser: 1.47 mg/dL — ABNORMAL HIGH (ref 0.61–1.24)
GFR, Estimated: 54 mL/min — ABNORMAL LOW (ref >60)
Glucose, Bld: 123 mg/dL — ABNORMAL HIGH (ref 70–99)
Potassium: 4.8 mmol/L (ref 3.5–5.1)
Sodium: 151 mmol/L — ABNORMAL HIGH (ref 135–145)
Total Bilirubin: 0.8 mg/dL (ref 0.0–1.2)
Total Protein: 6.1 g/dL — ABNORMAL LOW (ref 6.5–8.1)

## 2024-04-02 LAB — GLUCOSE, CAPILLARY
Glucose-Capillary: 109 mg/dL — ABNORMAL HIGH (ref 70–99)
Glucose-Capillary: 114 mg/dL — ABNORMAL HIGH (ref 70–99)
Glucose-Capillary: 134 mg/dL — ABNORMAL HIGH (ref 70–99)
Glucose-Capillary: 174 mg/dL — ABNORMAL HIGH (ref 70–99)
Glucose-Capillary: 203 mg/dL — ABNORMAL HIGH (ref 70–99)
Glucose-Capillary: 211 mg/dL — ABNORMAL HIGH (ref 70–99)
Glucose-Capillary: 93 mg/dL (ref 70–99)

## 2024-04-02 LAB — MAGNESIUM: Magnesium: 2.8 mg/dL — ABNORMAL HIGH (ref 1.7–2.4)

## 2024-04-02 LAB — ECHO TEE

## 2024-04-02 LAB — C-REACTIVE PROTEIN: CRP: 1.7 mg/dL — ABNORMAL HIGH (ref <1.0)

## 2024-04-02 LAB — PROCALCITONIN: Procalcitonin: 7.59 ng/mL

## 2024-04-02 LAB — PHOSPHORUS: Phosphorus: 4 mg/dL (ref 2.5–4.6)

## 2024-04-02 SURGERY — TRANSESOPHAGEAL ECHOCARDIOGRAM (TEE) (CATHLAB)
Anesthesia: Monitor Anesthesia Care

## 2024-04-02 MED ORDER — OXYCODONE HCL 5 MG PO TABS: 5.0000 mg | ORAL_TABLET | ORAL | Status: DC | PRN

## 2024-04-02 MED ORDER — SODIUM CHLORIDE 0.9 % IV SOLN: INTRAVENOUS | Status: DC

## 2024-04-02 MED ORDER — DOCUSATE SODIUM 100 MG PO CAPS: 100.0000 mg | ORAL_CAPSULE | Freq: Two times a day (BID) | ORAL | Status: DC

## 2024-04-02 MED ORDER — POLYETHYLENE GLYCOL 3350 17 G PO PACK: 17.0000 g | PACK | Freq: Every day | ORAL | Status: DC

## 2024-04-02 MED ORDER — ACETAMINOPHEN 325 MG PO TABS
650.0000 mg | ORAL_TABLET | Freq: Four times a day (QID) | ORAL | Status: DC
Start: 2024-04-02 — End: 2024-04-03

## 2024-04-02 MED ORDER — GABAPENTIN 250 MG/5ML PO SOLN
100.0000 mg | Freq: Three times a day (TID) | ORAL | Status: DC
  Filled 2024-04-02 (×3): qty 2

## 2024-04-02 MED ORDER — VITAMIN C 500 MG PO TABS: 1000.0000 mg | ORAL_TABLET | Freq: Every day | ORAL | Status: DC

## 2024-04-02 MED ORDER — ZINC SULFATE 220 (50 ZN) MG PO CAPS: 220.0000 mg | ORAL_CAPSULE | Freq: Every day | ORAL | Status: DC

## 2024-04-02 MED ORDER — VITAL 1.5 CAL PO LIQD: 1000.0000 mL | ORAL | Status: DC

## 2024-04-02 MED ORDER — PROPOFOL 10 MG/ML IV BOLUS
INTRAVENOUS | Status: DC | PRN
  Administered 2024-04-02 (×4): 20 mg via INTRAVENOUS

## 2024-04-02 MED ORDER — LEVOTHYROXINE SODIUM 50 MCG PO TABS
50.0000 ug | ORAL_TABLET | Freq: Every day | ORAL | Status: DC
Start: 2024-04-03 — End: 2024-04-02

## 2024-04-02 MED ORDER — VITAL 1.5 CAL PO LIQD
1000.0000 mL | ORAL | Status: DC
Start: 2024-04-02 — End: 2024-04-03
  Filled 2024-04-02 (×2): qty 1000

## 2024-04-02 MED ORDER — PHENYLEPHRINE 80 MCG/ML (10ML) SYRINGE FOR IV PUSH (FOR BLOOD PRESSURE SUPPORT)
PREFILLED_SYRINGE | INTRAVENOUS | Status: DC | PRN
  Administered 2024-04-02 (×2): 120 ug via INTRAVENOUS

## 2024-04-02 MED ORDER — DEXTROSE 5 % IV SOLN: INTRAVENOUS | Status: AC

## 2024-04-02 MED ORDER — VITAMIN C 500 MG PO TABS
1000.0000 mg | ORAL_TABLET | Freq: Every day | ORAL | Status: DC
  Administered 2024-04-02: 1000 mg via ORAL

## 2024-04-02 MED ORDER — GABAPENTIN 250 MG/5ML PO SOLN
100.0000 mg | Freq: Three times a day (TID) | ORAL | Status: DC
  Filled 2024-04-02 (×2): qty 2

## 2024-04-02 MED ORDER — INSULIN GLARGINE 100 UNIT/ML ~~LOC~~ SOLN
10.0000 [IU] | Freq: Once | SUBCUTANEOUS | Status: AC
  Administered 2024-04-02: 10 [IU] via SUBCUTANEOUS
  Filled 2024-04-02: qty 0.1

## 2024-04-02 MED ORDER — LEVOTHYROXINE SODIUM 50 MCG PO TABS: 50.0000 ug | ORAL_TABLET | Freq: Every day | ORAL | Status: DC

## 2024-04-02 MED ORDER — ACETAMINOPHEN 325 MG PO TABS: 650.0000 mg | ORAL_TABLET | Freq: Four times a day (QID) | ORAL | Status: DC

## 2024-04-02 MED ORDER — LIDOCAINE HCL 4 % EX SOLN
CUTANEOUS | Status: DC | PRN
  Administered 2024-04-02: 3 mL via TOPICAL

## 2024-04-02 MED ORDER — DOCUSATE SODIUM 50 MG/5ML PO LIQD: 100.0000 mg | Freq: Two times a day (BID) | ORAL | Status: DC

## 2024-04-02 MED ORDER — PROPOFOL 500 MG/50ML IV EMUL
INTRAVENOUS | Status: DC | PRN
  Administered 2024-04-02: 100 ug/kg/min via INTRAVENOUS

## 2024-04-02 NOTE — Care Management (Signed)
 LVM with patient's brother Bradin Mcadory [phone] Requesting callback to discuss DC planning. Will discuss LTACH options upon return call.

## 2024-04-02 NOTE — Progress Notes (Signed)
 PROGRESS NOTE                                                                                                                                                                                                             Patient Demographics:    Fred Mcintosh, is a 63 y.o. male, DOB - Nov 28, 1960, FMW:[id number]  Outpatient Primary MD for the patient is [name], MD    LOS - 6  Admit date - 03/27/2024    Chief Complaint  Patient presents with   Altered Mental Status       Brief Narrative (HPI from H&P)   63 year old male with past medical history of diabetes, hypertension, asthma, hyperthyroidism/[place]', recent left foot amputation who presented to the emergency department on 9/10 AM with complaint of altered mental status. Per EDP, family called 911 after patient reportedly laying in bed for 9 days. With EMS, blood glucose out of range, noted coffee-ground emesis and dark tarry stools. On arrival to ED, tachycardic to 130s, lethargic. Left foot noted to be dehisced with exposed bone, foul drainage and subcutaneous emphysema up the shin. Labs notable for lactic acid 5.4, Na 128, K 5.9, Cl 89, bicarb 16, BG 816, BUN 101, sCr 2.57, alk phos 127, Tbili 3.3, AG 23, WBC 31.4, Hgb 9, plt 440, INR 1.5, BHB 3.71, UA with trace leukocytes. Blood cultures were drawn and pending. CXR with no acute abnormality. Left foot xray with air around the stump and lower half of leg. Obvious concern for necrotizing fasciitis with septic shock, HHS.   He was given 3L LR fluid resuscitation, started on Zyvox  and Zosyn , insulin  drip. Requiring low dose levophed  despite fluid resuscitation. Orthopedic surgery consulted. Dr. Harden to take him to OR later today. 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 Harden, seen by ID, PCCM and orthopedics stabilized and transferred to hospitalist  service under my care on 04/01/2024.     Significant Hospital Events: Including procedures, antibiotic start and stop dates in addition to other pertinent events           9/10: ED for septic shock vs left foot wound and nec fasc, HHS. Ortho to take to OR, CCM to admit ICU  9/11 POD#1 s/p L AKA, wound vac in place. Extubated 03/31/2024.  Echocardiogram with bubble study.  1. Left ventricular ejection fraction, by estimation, is 70 to 75%. The left ventricle has hyperdynamic function. Left ventricular endocardial border not optimally defined to evaluate regional wall motion. There is moderate concentric left ventricular hypertrophy. Left ventricular diastolic parameters are consistent with Grade I diastolic dysfunction (impaired relaxation).  2. Right ventricular systolic function was not well visualized. The right ventricular size is normal.  3. The mitral valve is normal in structure. No evidence of mitral valve regurgitation. No evidence of mitral stenosis.  4. There is a bright mobile target on the ventricular side of the TV leaflets that is worrisome for vegetation. The tricuspid valve is abnormal.  5. The aortic valve is tricuspid. Aortic valve regurgitation is not visualized. Aortic valve sclerosis/calcification is present, without any evidence of aortic stenosis. Aortic valve area, by VTI measures 3.16 cm. Aortic valve mean gradient measures 5.0 mmHg. Aortic valve Vmax measures 1.34 m/s.  6. The inferior vena cava is normal in size with greater than 50% respiratory variability, suggesting right atrial pressure of 3 mmHg.     Subjective:   Patient in bed, appears comfortable, denies any headache, no fever, no chest pain or pressure, no shortness of breath , no abdominal pain. No focal weakness.   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 Harden, has left AKA with a wound VAC, seen by ID as well, currently on IV Rocephin .  Echocardiogram shows questionable growth on tricuspid valve, cardiology consulted undergoing TEE on 04/02/2024.   Acute Metabolic encephalopathy- due to #1 above, no headache or focal deficits, treat sepsis, appears dehydrated, will be hydrated, supportive care and monitor.  Dehydration, hypernatremia acute kidney injury- due to sepsis hydrate and monitor.  Unfortunately due to nursing miscommunication his free water  flushes were missed on 04/01/2024, nursing staff informed, will give D5W as well, sugars may run high for a day.  Extra Lantus  x 1.  Ongoing dysphagia.  Still has NG tube from ER, mentation and cough reflex is good, requested speech to follow closely, hopefully will be transition to oral diet soon and we will discontinue NG tube.    Elevated Tbili- f/u LFTs this am  Hypothyroidism- continue PTA synthroid , needs OP f/u  History of hypertension no acute issues continue to monitor  History of asthma no wheezing, supportive care.  Hx of infrequent medical care- complicating control of above  Physical deconditioning.  PT OT, may require placement   DM2 with hyperglycemia- variable PO needs have made transitioning off PO difficult  Lab Results  Component Value Date   HGBA1C 6.7 (H) 03/27/2024    CBG (last 3)  Recent Labs    04/02/24 0238 04/02/24 0608 04/02/24 0730  GLUCAP 134* 114* 93         Condition - Extremely Guarded  Family Communication  : None present  Code Status : Full code  Consults  : PCCM, orthopedics, ID  PUD Prophylaxis :     Procedures  :            Disposition Plan  :    Status is: Inpatient   DVT Prophylaxis  :    SCD's Start: 03/27/24 1747 heparin  injection 5,000 Units Start: 03/27/24 1400    Lab Results  Component Value Date   PLT 142 (L) 04/02/2024  Diet :  Diet Order             Diet NPO time  specified  Diet effective midnight                    Inpatient Medications  Scheduled Meds:  [MAR Hold] sodium chloride    Intravenous Once   [MAR Hold] acetaminophen   650 mg Per Tube Q6H WA   [MAR Hold] vitamin C   1,000 mg Per Tube Daily   [MAR Hold] Chlorhexidine  Gluconate Cloth  6 each Topical Daily   [MAR Hold] docusate  100 mg Per Tube BID   [MAR Hold] feeding supplement (PROSource TF20)  60 mL Per Tube BID   [MAR Hold] free water   300 mL Per Tube Q4H   [MAR Hold] gabapentin   100 mg Per Tube Q8H   [MAR Hold] heparin   5,000 Units Subcutaneous Q8H   [MAR Hold] insulin  aspart  0-20 Units Subcutaneous Q4H   [MAR Hold] insulin  aspart  4 Units Subcutaneous Q4H   insulin  glargine  10 Units Subcutaneous Once   [MAR Hold] insulin  glargine  50 Units Subcutaneous Daily   [MAR Hold] leptospermum manuka honey  1 Application Topical Daily   [MAR Hold] levothyroxine   50 mcg Per Tube Q0600   [MAR Hold] nutrition supplement (JUVEN)  1 packet Per Tube BID BM   [MAR Hold] pantoprazole  (PROTONIX ) IV  40 mg Intravenous Q24H   [MAR Hold] polyethylene glycol  17 g Per Tube Daily   [MAR Hold] thiamine   100 mg Per Tube Daily   [MAR Hold] zinc  sulfate (50mg  elemental zinc )  220 mg Per Tube Daily   Continuous Infusions:  sodium chloride      [MAR Hold] cefTRIAXone  (ROCEPHIN )  IV Stopped (04/01/24 1752)   dextrose      feeding supplement (VITAL 1.5 CAL) 45 mL/hr at 04/01/24 0423   PRN Meds:.[MAR Hold] albuterol , [MAR Hold] dextrose , [MAR Hold] fentaNYL  (SUBLIMAZE ) injection, [MAR Hold] hydrALAZINE , [MAR Hold] labetalol , [MAR Hold] methocarbamol  (ROBAXIN ) injection, [MAR Hold] ondansetron  (ZOFRAN ) IV, [MAR Hold] mouth rinse, [MAR Hold] oxyCODONE     Objective:   Vitals:   04/02/24 0400 04/02/24 0500 04/02/24 0731 04/02/24 0849  BP: (!) 140/75  (!) 163/85 (!) 147/80  Pulse: 84  84 79  Resp:    15  Temp: 98.2 F (36.8 C)  97.8 F (36.6 C) 98.3 F (36.8 C)  TempSrc: Oral  Axillary Temporal   SpO2: 98%  100% 92%  Weight:  124.6 kg    Height:        Wt Readings from Last 3 Encounters:  04/02/24 124.6 kg  03/08/24 (!) 141.5 kg  01/18/24 (!) 153.3 kg     Intake/Output Summary (Last 24 hours) at 04/02/2024 1012 Last data filed at 04/02/2024 0520 Gross per 24 hour  Intake 2528.74 ml  Output 2800 ml  Net -271.26 ml     Physical Exam  Awake Alert, No new F.N deficits, Normal affect [place].AT,PERRAL Supple Neck, No JVD,   Symmetrical Chest wall movement, Good air movement bilaterally, CTAB RRR,No Gallops,Rubs or new Murmurs,  +ve B.Sounds, Abd Soft, No tenderness,   Left AKA stump site with wound VAC     Data Review:    Recent Labs  Lab 03/27/24 0909 03/27/24 1534 03/29/24 0508 03/30/24 0825 03/31/24 0838 04/01/24 0427 04/02/24 0419  WBC 31.4*   < > 23.0* 13.6* 12.9* 10.6* 11.4*  HGB 9.0*   < > 9.0* 9.0* 9.3* 8.7* 8.6*  HCT 28.1*   < >  26.4* 27.0* 29.0* 27.9* 28.5*  PLT 440*   < > 153 160 152 138* 142*  MCV 84.9   < > 82.8 85.2 87.3 91.5 94.4  MCH 27.2   < > 28.2 28.4 28.0 28.5 28.5  MCHC 32.0   < > 34.1 33.3 32.1 31.2 30.2  RDW 18.2*   < > 17.0* 17.2* 17.2* 17.2* 17.5*  LYMPHSABS 2.8  --   --   --   --   --  1.6  MONOABS 0.3  --   --   --   --   --  0.5  EOSABS 0.0  --   --   --   --   --  0.1  BASOSABS 0.0  --   --   --   --   --  0.0   < > = values in this interval not displayed.    Recent Labs  Lab 03/27/24 0909 03/27/24 0932 03/27/24 1031 03/27/24 1319 03/27/24 1529 03/27/24 1534 03/27/24 2041 03/28/24 0421 03/28/24 0717 03/29/24 0503 03/29/24 1802 03/29/24 2320 03/30/24 0825 03/30/24 1528 03/30/24 2223 03/31/24 0838 04/01/24 0427 04/01/24 0630 04/01/24 1108 04/02/24 0419  NA 128*  --   --  129*  --    < >  --    < >  --  142  --    < > 140 144 144 147*  --  151*  --  151*  K 5.9*  --   --  4.2  --    < >  --    < >  --  3.6  --    < > 4.1 4.7 4.3 4.7  --  5.0  --  4.8  CL 89*  --   --  94*  --   --   --    < >  --  102  --    < >  104 108 107 111  --  111  --  116*  CO2 16*  --   --  18*  --   --   --    < >  --  26  --    < > 25 28 25 26   --  27  --  26  ANIONGAP 23*  --   --  17*  --   --   --    < >  --  14  --    < > 11 8 12 10   --  13  --  9  GLUCOSE 816*  --   --  715*  --   --   --    < >  --  173*  --    < > 178* 193* 180* 187*  --  184*  --  123*  BUN 101*  --   --  110*  --   --   --    < >  --  89*  --    < > 93* 95* 97* 91*  --  91*  --  72*  CREATININE 2.57*  --   --  2.60*  --   --   --    < >  --  1.97*  --    < > 1.91* 1.83* 1.71* 1.59*  --  1.62*  --  1.47*  AST 25  --   --   --   --   --  32  --   --  24  --   --  21  --   --  19  --  25  --  18  ALT 18  --   --   --   --   --  13  --   --  10  --   --  8  --   --  8  --  9  --  11  ALKPHOS 127*  --   --   --   --   --  94  --   --  69  --   --  70  --   --  75  --  71  --  67  BILITOT 3.3*  --   --   --   --   --  3.5*  --   --  2.4*  --   --  1.6*  --   --  1.2  --  0.9  --  0.8  ALBUMIN 1.7*  --   --   --   --   --  <1.5*  --   --  <1.5*  --   --  <1.5*  --   --  1.5*  --  1.6*  --  1.6*  CRP  --  37.6*  --   --   --   --   --   --  28.4*  --   --   --   --   --   --   --   --   --  2.9* 1.7*  PROCALCITON  --   --   --   --   --   --   --   --   --   --   --   --   --   --   --   --  13.19  --   --  7.59  LATICACIDVEN 5.4*  --  6.3* 7.6* 7.6*  --   --   --   --   --   --   --   --   --   --   --   --   --   --   --   INR 1.5*  --   --   --   --   --   --   --   --   --   --   --   --   --   --   --   --   --   --   --   TSH 6.747*  --   --   --   --   --  7.797*  --   --   --   --   --   --   --   --   --   --   --   --   --   HGBA1C  --   --   --  6.7*  --   --   --   --   --   --   --   --   --   --   --   --   --   --   --   --   AMMONIA  --   --   --  27  --   --   --   --   --   --  21  --   --   --   --   --   --   --   --   --  MG  --   --   --   --   --   --  2.8*   < >  --  2.9*  --   --  2.9*  --   --  3.1* 3.1*  --   --  2.8*  PHOS  --   --    --   --   --   --   --    < >  --  4.1  --   --  2.9  --   --  2.8 3.5  --   --  4.0  CALCIUM  8.2*  --   --  8.2*  --   --   --    < >  --  7.9*  --    < > 7.9* 8.0* 8.1* 8.4*  --  8.7*  --  8.5*   < > = values in this interval not displayed.      Recent Labs  Lab 03/27/24 0909 03/27/24 0932 03/27/24 1031 03/27/24 1319 03/27/24 1529 03/27/24 2041 03/28/24 0421 03/28/24 0717 03/29/24 0503 03/29/24 1802 03/29/24 2320 03/30/24 0825 03/30/24 1528 03/30/24 2223 03/31/24 0838 04/01/24 0427 04/01/24 0630 04/01/24 1108 04/02/24 0419  CRP  --  37.6*  --   --   --   --   --  28.4*  --   --   --   --   --   --   --   --   --  2.9* 1.7*  PROCALCITON  --   --   --   --   --   --   --   --   --   --   --   --   --   --   --  13.19  --   --  7.59  LATICACIDVEN 5.4*  --  6.3* 7.6* 7.6*  --   --   --   --   --   --   --   --   --   --   --   --   --   --   INR 1.5*  --   --   --   --   --   --   --   --   --   --   --   --   --   --   --   --   --   --   TSH 6.747*  --   --   --   --  7.797*  --   --   --   --   --   --   --   --   --   --   --   --   --   HGBA1C  --   --   --  6.7*  --   --   --   --   --   --   --   --   --   --   --   --   --   --   --   AMMONIA  --   --   --  27  --   --   --   --   --  21  --   --   --   --   --   --   --   --   --   MG  --   --   --   --   --  2.8*   < >  --  2.9*  --   --  2.9*  --   --  3.1* 3.1*  --   --  2.8*  CALCIUM  8.2*  --   --  8.2*  --   --    < >  --  7.9*  --    < > 7.9* 8.0* 8.1* 8.4*  --  8.7*  --  8.5*   < > = values in this interval not displayed.    --------------------------------------------------------------------------------------------------------------- Lab Results  Component Value Date   CHOL 166 01/18/2024   HDL 15 (L) 01/18/2024   LDLCALC 127 (H) 01/18/2024   TRIG 169 (H) 03/28/2024   CHOLHDL 11.1 01/18/2024    Lab Results  Component Value Date   HGBA1C 6.7 (H) 03/27/2024      Micro Results Recent Results (from  the past 240 hours)  Blood Culture (routine x 2)     Status: None   Collection Time: 03/27/24  9:09 AM   Specimen: BLOOD  Result Value Ref Range Status   Specimen Description BLOOD BLOOD LEFT HAND  Final   Special Requests   Final    Blood Culture adequate volume BOTTLES DRAWN AEROBIC AND ANAEROBIC   Culture   Final    NO GROWTH 5 DAYS Performed at [facility], [address] [place], [place] [address]    Report Status 04/01/2024 FINAL  Final  Blood Culture (routine x 2)     Status: Abnormal   Collection Time: 03/27/24  9:10 AM   Specimen: BLOOD  Result Value Ref Range Status   Specimen Description   Final    BLOOD BLOOD RIGHT HAND Performed at [facility], [address], [place], [place] [address]    Special Requests   Final    Blood Culture results may not be optimal due to an inadequate volume of blood received in culture bottles BOTTLES DRAWN AEROBIC AND ANAEROBIC Performed at [facility], [address] [place], [place] [address]    Culture  Setup Time   Final    AEROBIC BOTTLE ONLY GRAM POSITIVE COCCI IN CHAINS Gram Stain Report Called to,Read Back By and Verified With: E GANT AT 0952 ON 09.11.25 BY ADGER J  CRITICAL RESULT CALLED TO, READ BACK BY AND VERIFIED WITH: PHARMD BLAKE WANNARAT ON 03/28/24 @ 1506 BY DRT Performed at [facility], [address] [place], [place] [address]    Culture STREPTOCOCCUS ANGINOSIS (A)  Final   Report Status 03/31/2024 FINAL  Final   Organism ID, Bacteria STREPTOCOCCUS ANGINOSIS  Final      Susceptibility   Streptococcus anginosis - MIC*    PENICILLIN  <=0.06 SENSITIVE Sensitive     CEFTRIAXONE  <=0.12 SENSITIVE Sensitive     ERYTHROMYCIN <=0.12 SENSITIVE Sensitive     LEVOFLOXACIN  <=0.25 SENSITIVE Sensitive     VANCOMYCIN  0.5 SENSITIVE Sensitive     * STREPTOCOCCUS ANGINOSIS  Blood Culture ID Panel (Reflexed)     Status: Abnormal   Collection Time: 03/27/24  9:10 AM  Result Value Ref Range Status   Enterococcus faecalis NOT  DETECTED NOT DETECTED Final   Enterococcus Faecium NOT DETECTED NOT DETECTED Final   Listeria monocytogenes NOT DETECTED NOT DETECTED Final   Staphylococcus species NOT DETECTED NOT DETECTED Final   Staphylococcus aureus (BCID) NOT DETECTED NOT DETECTED Final   Staphylococcus epidermidis NOT DETECTED NOT DETECTED Final   Staphylococcus lugdunensis NOT DETECTED NOT DETECTED Final   Streptococcus species DETECTED (A) NOT  DETECTED Final    Comment: Not Enterococcus species, Streptococcus agalactiae, Streptococcus pyogenes, or Streptococcus pneumoniae. CRITICAL RESULT CALLED TO, READ BACK BY AND VERIFIED WITH: PHARMD BLAKE WANNARAT ON 03/28/24 @ 1506 BY DRT    Streptococcus agalactiae NOT DETECTED NOT DETECTED Final   Streptococcus pneumoniae NOT DETECTED NOT DETECTED Final   Streptococcus pyogenes NOT DETECTED NOT DETECTED Final   A.calcoaceticus-baumannii NOT DETECTED NOT DETECTED Final   Bacteroides fragilis NOT DETECTED NOT DETECTED Final   Enterobacterales NOT DETECTED NOT DETECTED Final   Enterobacter cloacae complex NOT DETECTED NOT DETECTED Final   Escherichia coli NOT DETECTED NOT DETECTED Final   Klebsiella aerogenes NOT DETECTED NOT DETECTED Final   Klebsiella oxytoca NOT DETECTED NOT DETECTED Final   Klebsiella pneumoniae NOT DETECTED NOT DETECTED Final   Proteus species NOT DETECTED NOT DETECTED Final   Salmonella species NOT DETECTED NOT DETECTED Final   Serratia marcescens NOT DETECTED NOT DETECTED Final   Haemophilus influenzae NOT DETECTED NOT DETECTED Final   Neisseria meningitidis NOT DETECTED NOT DETECTED Final   Pseudomonas aeruginosa NOT DETECTED NOT DETECTED Final   Stenotrophomonas maltophilia NOT DETECTED NOT DETECTED Final   Candida albicans NOT DETECTED NOT DETECTED Final   Candida auris NOT DETECTED NOT DETECTED Final   Candida glabrata NOT DETECTED NOT DETECTED Final   Candida krusei NOT DETECTED NOT DETECTED Final   Candida parapsilosis NOT DETECTED NOT  DETECTED Final   Candida tropicalis NOT DETECTED NOT DETECTED Final   Cryptococcus neoformans/gattii NOT DETECTED NOT DETECTED Final    Comment: Performed at [facility], [address], [place], [place] [address]  MRSA Next Gen by PCR, Nasal     Status: None   Collection Time: 03/27/24 12:02 PM   Specimen: Nasal Mucosa; Nasal Swab  Result Value Ref Range Status   MRSA by PCR Next Gen NOT DETECTED NOT DETECTED Final    Comment: (NOTE) The GeneXpert MRSA Assay (FDA approved for NASAL specimens only), is one component of a comprehensive MRSA colonization surveillance program. It is not intended to diagnose MRSA infection nor to guide or monitor treatment for MRSA infections. Test performance is not FDA approved in patients less than 8 years old. Performed at [facility], [address], [place], [place] [address]   Culture, blood (Routine X 2) w Reflex to ID Panel     Status: None (Preliminary result)   Collection Time: 03/29/24 10:52 AM   Specimen: BLOOD LEFT HAND  Result Value Ref Range Status   Specimen Description BLOOD LEFT HAND  Final   Special Requests   Final    BOTTLES DRAWN AEROBIC AND ANAEROBIC Blood Culture results may not be optimal due to an inadequate volume of blood received in culture bottles   Culture   Final    NO GROWTH 4 DAYS Performed at [facility], [address] [place], [place] [address]    Report Status PENDING  Incomplete  Culture, blood (Routine X 2) w Reflex to ID Panel     Status: None (Preliminary result)   Collection Time: 03/29/24 10:52 AM   Specimen: BLOOD LEFT HAND  Result Value Ref Range Status   Specimen Description BLOOD LEFT HAND  Final   Special Requests   Final    BOTTLES DRAWN AEROBIC AND ANAEROBIC Blood Culture results may not be optimal due to an inadequate volume of blood received in culture bottles   Culture   Final    NO GROWTH 4 DAYS Performed  at [facility], [address], [place], [place]  [address]    Report Status PENDING  Incomplete    Radiology Report DG Chest Port 1 View Result Date: 04/02/2024 CLINICAL DATA:  Shortness of breath. EXAM: PORTABLE CHEST 1 VIEW COMPARISON:  03/27/2024 FINDINGS: Cardiopericardial silhouette is at upper limits of normal for size. The lungs are clear without focal pneumonia, edema, pneumothorax or pleural effusion. A feeding tube passes into the stomach although the distal tip position is not included on the film. No acute bony abnormality. Telemetry leads overlie the chest. IMPRESSION: No acute cardiopulmonary findings. Electronically Signed   By: Camellia Candle M.D.   On: 04/02/2024 06:52   ECHOCARDIOGRAM COMPLETE BUBBLE STUDY Result Date: 03/31/2024    ECHOCARDIOGRAM REPORT   Patient Name:   KWEKU STANKEY Date of Exam: 03/31/2024 Medical Rec #:  [id number]          Height:       74.0 in Accession #:    [id number]         Weight:       280.4 lb Date of Birth:  [birth year]         BSA:          2.509 m Patient Age:    62 years           BP:           150/80 mmHg Patient Gender: M                  HR:           95 bpm. Exam Location:  Inpatient Procedure: 2D Echo, Cardiac Doppler, Color Doppler and Intracardiac            Opacification Agent (Both Spectral and Color Flow Doppler were            utilized during procedure). Indications:    Endocarditis  History:        Patient has no prior history of Echocardiogram examinations.                 Risk Factors:Hypertension and Diabetes.  Sonographer:    Jayson Gaskins Referring Phys: [id number] [facility] IMPRESSIONS  1. Left ventricular ejection fraction, by estimation, is 70 to 75%. The left ventricle has hyperdynamic function. Left ventricular endocardial border not optimally defined to evaluate regional wall motion. There is moderate concentric left ventricular hypertrophy. Left ventricular diastolic parameters are consistent with Grade I diastolic dysfunction (impaired relaxation).  2. Right ventricular systolic  function was not well visualized. The right ventricular size is normal.  3. The mitral valve is normal in structure. No evidence of mitral valve regurgitation. No evidence of mitral stenosis.  4. There is a bright mobile target on the ventricular side of the TV leaflets that is worrisome for vegetation.. The tricuspid valve is abnormal.  5. The aortic valve is tricuspid. Aortic valve regurgitation is not visualized. Aortic valve sclerosis/calcification is present, without any evidence of aortic stenosis. Aortic valve area, by VTI measures 3.16 cm. Aortic valve mean gradient measures 5.0 mmHg. Aortic valve Vmax measures 1.34 m/s.  6. The inferior vena cava is normal in size with greater than 50% respiratory variability, suggesting right atrial pressure of 3 mmHg. Conclusion(s)/Recommendation(s): Findings concerning for tricuspid valve vegetation, would recommend Transesophageal Echocardiogram for clarification. FINDINGS  Left Ventricle: Left ventricular ejection fraction, by estimation, is 70 to 75%. The left ventricle has hyperdynamic function. Left ventricular endocardial border not optimally  defined to evaluate regional wall motion. The left ventricular internal cavity size was normal in size. There is moderate concentric left ventricular hypertrophy. Left ventricular diastolic parameters are consistent with Grade I diastolic dysfunction (impaired relaxation). Normal left ventricular filling pressure. Right Ventricle: The right ventricular size is normal. No increase in right ventricular wall thickness. Right ventricular systolic function was not well visualized. Left Atrium: Left atrial size was normal in size. Right Atrium: Right atrial size was normal in size. Pericardium: There is no evidence of pericardial effusion. Mitral Valve: The mitral valve is normal in structure. No evidence of mitral valve regurgitation. No evidence of mitral valve stenosis. Tricuspid Valve: There is a bright mobile target on the  ventricular side of the TV leaflets that is worrisome for vegetation. The tricuspid valve is abnormal. Tricuspid valve regurgitation is not demonstrated. No evidence of tricuspid stenosis. Aortic Valve: The aortic valve is tricuspid. Aortic valve regurgitation is not visualized. Aortic valve sclerosis/calcification is present, without any evidence of aortic stenosis. Aortic valve mean gradient measures 5.0 mmHg. Aortic valve peak gradient measures 7.2 mmHg. Aortic valve area, by VTI measures 3.16 cm. Pulmonic Valve: The pulmonic valve was normal in structure. Pulmonic valve regurgitation is not visualized. No evidence of pulmonic stenosis. Aorta: The aortic root is normal in size and structure. Venous: The inferior vena cava is normal in size with greater than 50% respiratory variability, suggesting right atrial pressure of 3 mmHg. IAS/Shunts: No atrial level shunt detected by color flow Doppler.  LEFT VENTRICLE PLAX 2D LVIDd:         3.70 cm   Diastology LVIDs:         1.70 cm   LV e' medial:    7.07 cm/s LV PW:         1.20 cm   LV E/e' medial:  9.4 LV IVS:        1.40 cm   LV e' lateral:   7.29 cm/s LVOT diam:     2.00 cm   LV E/e' lateral: 9.1 LV SV:         74 LV SV Index:   29 LVOT Area:     3.14 cm  RIGHT VENTRICLE RV S prime:     25.20 cm/s LEFT ATRIUM           Index        RIGHT ATRIUM           Index LA Vol (A4C): 55.5 ml 22.12 ml/m  RA Area:     17.60 cm                                    RA Volume:   48.60 ml  19.37 ml/m  AORTIC VALVE AV Area (Vmax):    3.00 cm AV Area (Vmean):   2.99 cm AV Area (VTI):     3.16 cm AV Vmax:           134.00 cm/s AV Vmean:          103.000 cm/s AV VTI:            0.233 m AV Peak Grad:      7.2 mmHg AV Mean Grad:      5.0 mmHg LVOT Vmax:         128.00 cm/s LVOT Vmean:        97.900 cm/s LVOT VTI:  0.234 m LVOT/AV VTI ratio: 1.00  AORTA Ao Root diam: 3.40 cm MITRAL VALVE MV Area (PHT): 2.75 cm    SHUNTS MV Decel Time: 276 msec    Systemic VTI:  0.23 m MV E  velocity: 66.60 cm/s  Systemic Diam: 2.00 cm MV A velocity: 88.60 cm/s MV E/A ratio:  0.75 Wilbert Bihari MD Electronically signed by Wilbert Bihari MD Signature Date/Time: 03/31/2024/11:55:49 AM    Final      Signature  -   Lavada Stank M.D on 04/02/2024 at 10:12 AM   -  To page go to [email or web address]

## 2024-04-02 NOTE — Progress Notes (Signed)
  Echocardiogram Echocardiogram Transesophageal has been performed.  Fred Mcintosh 04/02/2024, 10:39 AM

## 2024-04-02 NOTE — Progress Notes (Addendum)
 Nutrition Brief Note  Discussed patient during progression this morning. Down for TEE. Sodium remains high at 151 as he did not receive free water  flushes, as scheduled, yesterday.   Patient did not making to goal rate due to miscommunication with nursing during transfer out of ICU. Did not make it past 20ml/hr. MD amicable to increasing rate by 41ml/hr to 40ml/hr.   Goal rate continues to be 59ml/hr. He is currently meeting estimated protein needs at this rate, however calorie provision remains inadequate. While large body habitus seems to indicate he has plenty of storage from which to pull, would ideally like to prevent catabolism and loss of lean body mass d/t under nutrition.   Will continue to monitor and recommend increased enteral nutrition provision to promote wound healing and progression toward discharge. Hopefully mentation improves as sodium trends decline and diet can be advanced.    INTERVENTION:  Continue tube feeding via Cortrak: Vital 1.5 at 65 ml/h (1560 ml per day) Prosource TF20 60 ml BID FWF 300ml q4h, per MD (2400 ml per day)   Provides 2500 kcal, 145 gm protein, free water  daily  Current rate (54ml/hr) provides: 1960 kcals (82% estimated calorie needs), 121 g protein (100% estimated protein needs), and 3317 ml per day (FWF+TF)   Continue 100mg  thiamine  x 1 more day   1 packet Juven BID, each packet provides 95 calories, 2.5 grams of protein (collagen), and 9.8 grams of carbohydrate (3 grams sugar); also contains 7 grams of L-arginine and L-glutamine, 300 mg vitamin C , 15 mg vitamin E, 1.2 mcg vitamin B-12, 9.5 mg zinc , 200 mg calcium , and 1.5 g  Calcium  Beta-hydroxy-Beta-methylbutyrate to support wound healing   Per MD, vitamin C  1000 mg x 30 days and 220mg  of zinc     Monitor SLP notes for diet advancement and tolerance   NUTRITION DIAGNOSIS:  Increased nutrient needs related to wound healing as evidenced by estimated needs. - remains applicable   GOAL:   Patient will meet greater than or equal to 90% of their needs - progressing   MONITOR:  Diet advancement, I & O's, Labs, Skin  Blair Deaner MS, RD, LDN Registered Dietitian Clinical Nutrition RD Inpatient Contact Info in Amion

## 2024-04-02 NOTE — Anesthesia Postprocedure Evaluation (Signed)
 Anesthesia Post Note  Patient: Fred Mcintosh  Procedure(s) Performed: TRANSESOPHAGEAL ECHOCARDIOGRAM     Patient location during evaluation: PACU Anesthesia Type: MAC Level of consciousness: awake and alert Pain management: pain level controlled Vital Signs Assessment: post-procedure vital signs reviewed and stable Respiratory status: spontaneous breathing, nonlabored ventilation, respiratory function stable and patient connected to nasal cannula oxygen Cardiovascular status: stable and blood pressure returned to baseline Postop Assessment: no apparent nausea or vomiting Anesthetic complications: no   No notable events documented.  Last Vitals:  Vitals:   04/02/24 1031 04/02/24 1125  BP: 135/74 (!) 143/76  Pulse: 88 80  Resp: 13 11  Temp:  (!) 36.4 C  SpO2: 95% 94%    Last Pain:  Vitals:   04/02/24 1125  TempSrc: Axillary  PainSc:                  Thom JONELLE Peoples

## 2024-04-02 NOTE — Plan of Care (Signed)
  Problem: Education: Goal: Knowledge of General Education information will improve Description: Including pain rating scale, medication(s)/side effects and non-pharmacologic comfort measures Outcome: Progressing   Problem: Health Behavior/Discharge Planning: Goal: Ability to manage health-related needs will improve Outcome: Progressing   Problem: Clinical Measurements: Goal: Ability to maintain clinical measurements within normal limits will improve Outcome: Progressing Goal: Will remain free from infection Outcome: Progressing Goal: Diagnostic test results will improve Outcome: Progressing Goal: Respiratory complications will improve Outcome: Progressing Goal: Cardiovascular complication will be avoided Outcome: Progressing   Problem: Activity: Goal: Risk for activity intolerance will decrease Outcome: Progressing   Problem: Nutrition: Goal: Adequate nutrition will be maintained Outcome: Progressing   Problem: Coping: Goal: Level of anxiety will decrease Outcome: Progressing   Problem: Elimination: Goal: Will not experience complications related to bowel motility Outcome: Progressing Goal: Will not experience complications related to urinary retention Outcome: Progressing   Problem: Pain Managment: Goal: General experience of comfort will improve and/or be controlled Outcome: Progressing   Problem: Safety: Goal: Ability to remain free from injury will improve Outcome: Progressing   Problem: Skin Integrity: Goal: Risk for impaired skin integrity will decrease Outcome: Progressing   Problem: Education: Goal: Ability to describe self-care measures that may prevent or decrease complications (Diabetes Survival Skills Education) will improve Outcome: Progressing Goal: Individualized Educational Video(s) Outcome: Progressing   Problem: Coping: Goal: Ability to adjust to condition or change in health will improve Outcome: Progressing   Problem: Fluid  Volume: Goal: Ability to maintain a balanced intake and output will improve Outcome: Progressing   Problem: Health Behavior/Discharge Planning: Goal: Ability to identify and utilize available resources and services will improve Outcome: Progressing Goal: Ability to manage health-related needs will improve Outcome: Progressing   Problem: Metabolic: Goal: Ability to maintain appropriate glucose levels will improve Outcome: Progressing   Problem: Nutritional: Goal: Maintenance of adequate nutrition will improve Outcome: Progressing Goal: Progress toward achieving an optimal weight will improve Outcome: Progressing   Problem: Skin Integrity: Goal: Risk for impaired skin integrity will decrease Outcome: Progressing   Problem: Tissue Perfusion: Goal: Adequacy of tissue perfusion will improve Outcome: Progressing   Problem: Education: Goal: Knowledge of the prescribed therapeutic regimen will improve Outcome: Progressing Goal: Ability to verbalize activity precautions or restrictions will improve Outcome: Progressing Goal: Understanding of discharge needs will improve Outcome: Progressing   Problem: Activity: Goal: Ability to perform//tolerate increased activity and mobilize with assistive devices will improve Outcome: Progressing   Problem: Clinical Measurements: Goal: Postoperative complications will be avoided or minimized Outcome: Progressing   Problem: Self-Care: Goal: Ability to meet self-care needs will improve Outcome: Progressing   Problem: Self-Concept: Goal: Ability to maintain and perform role responsibilities to the fullest extent possible will improve Outcome: Progressing   Problem: Pain Management: Goal: Pain level will decrease with appropriate interventions Outcome: Progressing   Problem: Skin Integrity: Goal: Demonstration of wound healing without infection will improve Outcome: Progressing   Problem: Activity: Goal: Ability to tolerate  increased activity will improve Outcome: Progressing   Problem: Respiratory: Goal: Ability to maintain a clear airway and adequate ventilation will improve Outcome: Progressing   Problem: Role Relationship: Goal: Method of communication will improve Outcome: Progressing   Problem: Safety: Goal: Non-violent Restraint(s) Outcome: Progressing

## 2024-04-02 NOTE — Anesthesia Preprocedure Evaluation (Signed)
 Anesthesia Evaluation  Patient identified by MRN, date of birth, ID band  Reviewed: Allergy & Precautions, Patient's Chart, lab work & pertinent test results, Unable to perform ROS - Chart review only  History of Anesthesia Complications Negative for: history of anesthetic complications  Airway Mallampati: II  TM Distance: >3 FB Neck ROM: Full    Dental no notable dental hx. (+) Teeth Intact, Dental Advisory Given,    Pulmonary asthma , pneumonia   Pulmonary exam normal breath sounds clear to auscultation       Cardiovascular hypertension, Pt. 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   TTE to rule out endocarditis in the setting of known bacteremia   Reproductive/Obstetrics                              Anesthesia Physical Anesthesia Plan  ASA: 3  Anesthesia Plan: MAC   Post-op Pain Management: Minimal or no pain anticipated   Induction: Intravenous  PONV Risk Score and Plan: Treatment may vary due to age or medical condition and TIVA  Airway Management Planned:   Additional Equipment:   Intra-op Plan:   Post-operative Plan:   Informed Consent: I have reviewed the patients History and Physical, chart, labs and discussed the procedure including the risks, benefits and alternatives for the proposed anesthesia with the patient or authorized representative who has indicated his/her understanding and acceptance.       Plan Discussed with:   Anesthesia Plan Comments:  (  )         Anesthesia Quick Evaluation

## 2024-04-02 NOTE — CV Procedure (Signed)
     PROCEDURE NOTE:  Procedure:  Transesophageal echocardiogram Operator:  Wilbert Bihari, MD Indications:  Strep bacteremia Complications: None  During this procedure the patient is administered a total of Propofol  220 mg and  to achieve and maintain moderate conscious sedation.  The patient's heart rate, blood pressure, and oxygen saturation are monitored continuously during the procedure. The period of conscious sedation is 6 minutes, of which I was present face-to-face 100% of this time. Rumalda Favor, CRNA is an independent, trained observer who assisted in the monitoring of the patient's level of consciousness.    Results: Normal LV size and function Normal RV size and function Normal RA Normal LA and LA appendage with no thrombus and normal emptying velocity of 85. Normal TV with trivial TR Normal PV with trivial PR Normal MV with trivial MR Trileaflet AV with mild aortic valve sclerosis with no stenosis. Normal interatrial septum with no evidence of shunt by colorflow dopper  Normal thoracic and ascending aorta. No evidence of vegetation.  The patient tolerated the procedure well and was transferred back to their room in stable condition.  Signed: Wilbert Bihari, MD [facility]

## 2024-04-02 NOTE — Progress Notes (Signed)
 SLP Cancellation Note  Patient Details Name: Fred Mcintosh MRN: [id number] DOB: May 13, 1961   Cancelled treatment:       Reason Eval/Treat Not Completed: Patient at procedure or test/unavailable. Will f/u.    Damien Blumenthal, M.A., CCC-SLP Speech Language Pathology, Acute Rehabilitation Services  Secure Chat preferred [phone]  04/02/2024, 9:49 AM

## 2024-04-02 NOTE — Transfer of Care (Signed)
 Immediate Anesthesia Transfer of Care Note  Patient: Fred Mcintosh  Procedure(s) Performed: TRANSESOPHAGEAL ECHOCARDIOGRAM  Patient Location: Cath Lab   Anesthesia Type:MAC  Level of Consciousness: drowsy and responds to stimulation  Airway & Oxygen Therapy: Patient Spontanous Breathing  Post-op Assessment: Report given to RN and Post -op Vital signs reviewed and stable  Post vital signs: Reviewed and stable  Last Vitals:  Vitals Value Taken Time  BP    Temp    Pulse 88 04/02/24 10:30  Resp 12 04/02/24 10:30  SpO2 95 % 04/02/24 10:30  Vitals shown include unfiled device data.  Last Pain:  Vitals:   04/02/24 0857  TempSrc:   PainSc: Asleep      Patients Stated Pain Goal: 0 (04/01/24 2100)  Complications: No notable events documented.

## 2024-04-02 NOTE — Plan of Care (Signed)
 Patient removed the cortak.  Requesting Cortrak him to replace the tube again.  Continue maintenance fluid by the meantime.  Patient has significant dysphagia unable to tolerate any oral food.

## 2024-04-02 NOTE — Interval H&P Note (Signed)
 History and Physical Interval Note:  04/02/2024 8:49 AM  Fred Mcintosh  has presented today for surgery, with the diagnosis of bacteremia.  The various methods of treatment have been discussed with the patient and family. After consideration of risks, benefits and other options for treatment, the patient has consented to  Procedure(s): TRANSESOPHAGEAL ECHOCARDIOGRAM (N/A) as a surgical intervention.  The patient's history has been reviewed, patient examined, no change in status, stable for surgery.  I have reviewed the patient's chart and labs.  Questions were answered to the patient's satisfaction.     Wilbert Bihari

## 2024-04-02 NOTE — Progress Notes (Signed)
 Physical Therapy Treatment Patient Details Name: Fred Mcintosh MRN: [id number] DOB: [birth year] Today's Date: 04/02/2024   History of Present Illness 63 y.o. 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 Pt able to assist with rolling and transition to and from sitting this session with improved sitting balance. Pt following commands with multimodal cues for RLE HEP EOB and requires +2 assist for all transfers. Pt incontinent of stool on arrival, unaware and increased time for pericare. Patient will benefit from continued inpatient follow up therapy, <3 hours/day      If plan is discharge home, recommend the following: Two people to help with walking and/or transfers;Two people to help with bathing/dressing/bathroom;Direct supervision/assist for medications management;Assistance with feeding;Assistance with cooking/housework;Direct supervision/assist for financial management;Supervision due to cognitive status;Assist for transportation;Help with stairs or ramp for entrance   Can travel by private vehicle     No  Equipment Recommendations  Wheelchair (measurements PT);Wheelchair cushion (measurements PT);BSC/3in1;Hospital bed;Hoyer lift    Recommendations for Other Services       Precautions / Restrictions Precautions Precautions: Fall;Other (comment) Recall of Precautions/Restrictions: Impaired Precaution/Restrictions Comments: cortrak, Lt AKA, sacral wound Restrictions LLE Weight Bearing Per Provider Order: Non weight bearing     Mobility  Bed Mobility Overal bed mobility: Needs Assistance Bed Mobility: Rolling, Supine to Sit, Sit to Supine Rolling: Mod assist, Max assist   Supine to sit: Max assist, +2 for physical  assistance, HOB elevated, Used rails Sit to supine: Max assist, +2 for physical assistance   General bed mobility comments: mod to roll Rt, max to roll left. Max +2 to pivot to EOB and back to supine. Pt able to sit EOB with CGA statically 4 min. min assist with RLE movement    Transfers                   General transfer comment: will require maximove    Ambulation/Gait                   Stairs             Wheelchair Mobility     Tilt Bed    Modified Rankin (Stroke Patients Only)       Balance Overall balance assessment: Needs assistance Sitting-balance support: Feet supported, No upper extremity supported Sitting balance-Leahy Scale: Fair Sitting balance - Comments: static sitting CGA                                    Communication Communication Communication: Impaired Factors Affecting Communication: Reduced clarity of speech  Cognition Arousal: Lethargic Behavior During Therapy: Flat affect   PT - Cognitive impairments: No family/caregiver present to determine baseline, Sequencing, Problem solving, Safety/Judgement, Orientation, Awareness, Memory, Difficult to assess, Attention   Orientation impairments: Time, Situation, Place                   PT - Cognition Comments: pt oriented to self and city, poor insight and very limited command following with increased time and multimodal cues, unaware of month, year or situation. Max multimodal cues Following commands: Impaired Following commands impaired: Follows one step commands inconsistently    Cueing  Cueing Techniques: Verbal cues, Visual cues, Tactile cues, Gestural cues  Exercises General Exercises - Lower Extremity Long Arc Quad: AAROM, Right, Seated, 10 reps, Strengthening Hip Flexion/Marching: AAROM, Right, Seated, 10 reps, Strengthening    General Comments        Pertinent Vitals/Pain Pain Assessment Faces Pain Scale: Hurts little more Pain Location:  pain response with movement of LLE Pain Descriptors / Indicators: Grimacing, Guarding Pain Intervention(s): Limited activity within patient's tolerance, Monitored during session, Repositioned    Home Living                          Prior Function            PT Goals (current goals can now be found in the care plan section) Progress towards PT goals: Progressing toward goals    Frequency    Min 2X/week      PT Plan      Co-evaluation              AM-PAC PT 6 Clicks Mobility   Outcome Measure  Help needed turning from your back to your side while in a flat bed without using bedrails?: A Lot Help needed moving from lying on your back to sitting on the side of a flat bed without using bedrails?: Total Help needed moving to and from a bed to a chair (including a wheelchair)?: Total Help needed standing up from a chair using your arms (e.g., wheelchair or bedside chair)?: Total Help needed to walk in hospital room?: Total Help needed climbing 3-5 steps with a railing? : Total 6 Click Score: 7    End of Session   Activity Tolerance: Patient limited by lethargy Patient left: in bed;with call bell/phone within reach;with nursing/sitter in room;with bed alarm set Nurse Communication: Mobility status;Need for lift equipment PT Visit Diagnosis: Difficulty in walking, not elsewhere classified (R26.2);Unsteadiness on feet (R26.81);Other abnormalities of gait and mobility (R26.89);Muscle weakness (generalized) (M62.81)     Time: 9198-9168 PT Time Calculation (min) (ACUTE ONLY): 30 min  Charges:    $Therapeutic Activity: 23-37 mins PT General Charges $$ ACUTE PT VISIT: 1 Visit                     Lenoard SQUIBB, PT Acute Rehabilitation Services Office: [phone]    Lenoard NOVAK Lamar Naef 04/02/2024, 10:17 AM

## 2024-04-02 NOTE — Progress Notes (Signed)
 Speech Language Pathology Treatment: Dysphagia  Patient Details Name: Fred Mcintosh MRN: [id number] DOB: 06-22-1961 Today's Date: 04/02/2024 Time: 8568-8553 SLP Time Calculation (min) (ACUTE ONLY): 15 min  Assessment / Plan / Recommendation Clinical Impression  Pt more alert compared to previous visits and is more impulsive with POs. He takes large sips that are often followed by delayed coughing. He could not complete the 3 oz water  test without prolonged coughing. Oral transit appears improved overall, though he still intermittently requires cueing when less attentive. Given ongoing s/s of aspiration, will proceed with an MBS and his mentation is now appropriate for participation. Pending results, recommend NPO except ice chips in moderation after oral care with RN supervision. Discussed with family members and RN, will f/u as scheduling allows.    HPI HPI: 63 yo male admitted 9/10 with AMS and weakness after not getting OOB for 9 days. 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 ETT 9/10-9/11. PMH includes: recent L foot amputation, DM, HTN, asthma, Graves disease      SLP Plan  MBS          Recommendations  Diet recommendations: NPO Medication Administration: Via alternative means                  Oral care QID;Oral care prior to ice chip/H20   Frequent or constant Supervision/Assistance Dysphagia, unspecified (R13.10)     MBS     Damien Blumenthal, M.A., CCC-SLP Speech Language Pathology, Acute Rehabilitation Services  Secure Chat preferred [phone]   04/02/2024, 3:16 PM

## 2024-04-03 ENCOUNTER — Inpatient Hospital Stay (HOSPITAL_COMMUNITY)

## 2024-04-03 ENCOUNTER — Encounter (HOSPITAL_COMMUNITY): Payer: Self-pay | Admitting: Cardiology

## 2024-04-03 DIAGNOSIS — M726 Necrotizing fasciitis: Secondary | ICD-10-CM | POA: Diagnosis not present

## 2024-04-03 LAB — MAGNESIUM: Magnesium: 2.7 mg/dL — ABNORMAL HIGH (ref 1.7–2.4)

## 2024-04-03 LAB — CBC WITH DIFFERENTIAL/PLATELET
Abs Immature Granulocytes: 0.13 K/uL — ABNORMAL HIGH (ref 0.00–0.07)
Basophils Absolute: 0 K/uL (ref 0.0–0.1)
Basophils Relative: 0 %
Eosinophils Absolute: 0.1 K/uL (ref 0.0–0.5)
Eosinophils Relative: 1 %
HCT: 29.8 % — ABNORMAL LOW (ref 39.0–52.0)
Hemoglobin: 9 g/dL — ABNORMAL LOW (ref 13.0–17.0)
Immature Granulocytes: 1 %
Lymphocytes Relative: 12 %
Lymphs Abs: 1.2 K/uL (ref 0.7–4.0)
MCH: 28.8 pg (ref 26.0–34.0)
MCHC: 30.2 g/dL (ref 30.0–36.0)
MCV: 95.2 fL (ref 80.0–100.0)
Monocytes Absolute: 0.4 K/uL (ref 0.1–1.0)
Monocytes Relative: 4 %
Neutro Abs: 8.5 K/uL — ABNORMAL HIGH (ref 1.7–7.7)
Neutrophils Relative %: 82 %
Platelets: 151 K/uL (ref 150–400)
RBC: 3.13 MIL/uL — ABNORMAL LOW (ref 4.22–5.81)
RDW: 18.1 % — ABNORMAL HIGH (ref 11.5–15.5)
WBC: 10.4 K/uL (ref 4.0–10.5)
nRBC: 0.3 % — ABNORMAL HIGH (ref 0.0–0.2)

## 2024-04-03 LAB — PROCALCITONIN: Procalcitonin: 5 ng/mL

## 2024-04-03 LAB — COMPREHENSIVE METABOLIC PANEL WITH GFR
ALT: 12 U/L (ref 0–44)
AST: 17 U/L (ref 15–41)
Albumin: 1.6 g/dL — ABNORMAL LOW (ref 3.5–5.0)
Alkaline Phosphatase: 66 U/L (ref 38–126)
Anion gap: 5 (ref 5–15)
BUN: 61 mg/dL — ABNORMAL HIGH (ref 8–23)
CO2: 27 mmol/L (ref 22–32)
Calcium: 8.5 mg/dL — ABNORMAL LOW (ref 8.9–10.3)
Chloride: 118 mmol/L — ABNORMAL HIGH (ref 98–111)
Creatinine, Ser: 1.47 mg/dL — ABNORMAL HIGH (ref 0.61–1.24)
GFR, Estimated: 54 mL/min — ABNORMAL LOW (ref >60)
Glucose, Bld: 205 mg/dL — ABNORMAL HIGH (ref 70–99)
Potassium: 5.1 mmol/L (ref 3.5–5.1)
Sodium: 150 mmol/L — ABNORMAL HIGH (ref 135–145)
Total Bilirubin: 0.7 mg/dL (ref 0.0–1.2)
Total Protein: 6.1 g/dL — ABNORMAL LOW (ref 6.5–8.1)

## 2024-04-03 LAB — CULTURE, BLOOD (ROUTINE X 2)
Culture: NO GROWTH
Culture: NO GROWTH

## 2024-04-03 LAB — PHOSPHORUS: Phosphorus: 4 mg/dL (ref 2.5–4.6)

## 2024-04-03 LAB — GLUCOSE, CAPILLARY
Glucose-Capillary: 188 mg/dL — ABNORMAL HIGH (ref 70–99)
Glucose-Capillary: 168 mg/dL — ABNORMAL HIGH (ref 70–99)
Glucose-Capillary: 175 mg/dL — ABNORMAL HIGH (ref 70–99)
Glucose-Capillary: 254 mg/dL — ABNORMAL HIGH (ref 70–99)
Glucose-Capillary: 293 mg/dL — ABNORMAL HIGH (ref 70–99)
Glucose-Capillary: 242 mg/dL — ABNORMAL HIGH (ref 70–99)

## 2024-04-03 LAB — C-REACTIVE PROTEIN: CRP: 2.1 mg/dL — ABNORMAL HIGH (ref <1.0)

## 2024-04-03 MED ORDER — INSULIN ASPART 100 UNIT/ML IJ SOLN
0.0000 [IU] | Freq: Three times a day (TID) | INTRAMUSCULAR | Status: DC
  Administered 2024-04-03 – 2024-04-04 (×2): 8 [IU] via SUBCUTANEOUS
  Administered 2024-04-04: 5 [IU] via SUBCUTANEOUS
  Administered 2024-04-04: 8 [IU] via SUBCUTANEOUS
  Administered 2024-04-05 (×2): 3 [IU] via SUBCUTANEOUS
  Administered 2024-04-05: 2 [IU] via SUBCUTANEOUS
  Administered 2024-04-06: 3 [IU] via SUBCUTANEOUS
  Administered 2024-04-06 – 2024-04-07 (×4): 2 [IU] via SUBCUTANEOUS
  Administered 2024-04-07 – 2024-04-08 (×2): 5 [IU] via SUBCUTANEOUS
  Administered 2024-04-08: 2 [IU] via SUBCUTANEOUS
  Administered 2024-04-08 – 2024-04-09 (×2): 3 [IU] via SUBCUTANEOUS
  Administered 2024-04-09: 2 [IU] via SUBCUTANEOUS
  Administered 2024-04-10 (×2): 3 [IU] via SUBCUTANEOUS
  Administered 2024-04-11: 11 [IU] via SUBCUTANEOUS
  Administered 2024-04-11 – 2024-04-12 (×2): 3 [IU] via SUBCUTANEOUS
  Administered 2024-04-12: 2 [IU] via SUBCUTANEOUS
  Administered 2024-04-12: 3 [IU] via SUBCUTANEOUS
  Administered 2024-04-13 (×2): 5 [IU] via SUBCUTANEOUS
  Administered 2024-04-14: 8 [IU] via SUBCUTANEOUS
  Administered 2024-04-14: 3 [IU] via SUBCUTANEOUS
  Administered 2024-04-14: 2 [IU] via SUBCUTANEOUS
  Administered 2024-04-15 (×2): 3 [IU] via SUBCUTANEOUS
  Administered 2024-04-15: 2 [IU] via SUBCUTANEOUS
  Administered 2024-04-16: 5 [IU] via SUBCUTANEOUS
  Administered 2024-04-16 – 2024-04-17 (×3): 2 [IU] via SUBCUTANEOUS
  Administered 2024-04-17: 3 [IU] via SUBCUTANEOUS
  Administered 2024-04-17: 2 [IU] via SUBCUTANEOUS
  Administered 2024-04-20 – 2024-04-21 (×2): 3 [IU] via SUBCUTANEOUS
  Administered 2024-04-22 – 2024-04-24 (×4): 2 [IU] via SUBCUTANEOUS
  Administered 2024-04-25: 3 [IU] via SUBCUTANEOUS

## 2024-04-03 MED ORDER — POLYETHYLENE GLYCOL 3350 17 G PO PACK
17.0000 g | PACK | Freq: Every day | ORAL | Status: DC
  Filled 2024-04-03 (×8): qty 1

## 2024-04-03 MED ORDER — INSULIN ASPART 100 UNIT/ML IJ SOLN
0.0000 [IU] | Freq: Every day | INTRAMUSCULAR | Status: DC
  Administered 2024-04-03: 2 [IU] via SUBCUTANEOUS
  Administered 2024-04-04: 5 [IU] via SUBCUTANEOUS
  Administered 2024-04-12 – 2024-04-26 (×2): 3 [IU] via SUBCUTANEOUS

## 2024-04-03 MED ORDER — LEVOTHYROXINE SODIUM 50 MCG PO TABS
50.0000 ug | ORAL_TABLET | Freq: Every day | ORAL | Status: DC
  Administered 2024-04-04 – 2024-05-01 (×28): 50 ug via ORAL
  Filled 2024-04-03 (×28): qty 1

## 2024-04-03 MED ORDER — JUVEN PO PACK
1.0000 | PACK | Freq: Two times a day (BID) | ORAL | Status: DC
  Administered 2024-04-03 – 2024-04-08 (×9): 1 via ORAL
  Filled 2024-04-03 (×10): qty 1

## 2024-04-03 MED ORDER — DOCUSATE SODIUM 100 MG PO CAPS
100.0000 mg | ORAL_CAPSULE | Freq: Two times a day (BID) | ORAL | Status: DC
  Administered 2024-04-08 – 2024-04-15 (×9): 100 mg via ORAL
  Filled 2024-04-03 (×22): qty 1

## 2024-04-03 MED ORDER — ACETAMINOPHEN 325 MG PO TABS
650.0000 mg | ORAL_TABLET | Freq: Four times a day (QID) | ORAL | Status: DC
  Administered 2024-04-03 – 2024-04-26 (×63): 650 mg via ORAL
  Filled 2024-04-03 (×69): qty 2

## 2024-04-03 MED ORDER — OXYCODONE HCL 5 MG PO TABS
5.0000 mg | ORAL_TABLET | ORAL | Status: DC | PRN
  Administered 2024-04-18 – 2024-04-24 (×3): 5 mg via ORAL
  Filled 2024-04-03 (×3): qty 1

## 2024-04-03 MED ORDER — PENICILLIN G POTASSIUM 20000000 UNITS IJ SOLR
24.0000 10*6.[IU] | INTRAVENOUS | Status: AC
  Administered 2024-04-03 – 2024-04-12 (×10): 24 10*6.[IU] via INTRAVENOUS
  Filled 2024-04-03 (×5): qty 20
  Filled 2024-04-03: qty 5
  Filled 2024-04-03: qty 24
  Filled 2024-04-03: qty 20
  Filled 2024-04-03: qty 24
  Filled 2024-04-03: qty 20

## 2024-04-03 MED ORDER — INSULIN GLARGINE 100 UNIT/ML ~~LOC~~ SOLN
10.0000 [IU] | SUBCUTANEOUS | Status: DC
Start: 2024-04-03 — End: 2024-04-04
  Administered 2024-04-03 – 2024-04-04 (×2): 10 [IU] via SUBCUTANEOUS
  Filled 2024-04-03 (×2): qty 0.1

## 2024-04-03 MED ORDER — JUVEN PO PACK: 1.0000 | PACK | Freq: Two times a day (BID) | ORAL | Status: DC

## 2024-04-03 MED ORDER — ENSURE PLUS HIGH PROTEIN PO LIQD
237.0000 mL | Freq: Two times a day (BID) | ORAL | Status: DC
  Administered 2024-04-03 – 2024-04-05 (×4): 237 mL via ORAL

## 2024-04-03 MED ORDER — VITAMIN C 500 MG PO TABS
1000.0000 mg | ORAL_TABLET | Freq: Every day | ORAL | Status: AC
  Administered 2024-04-04 – 2024-04-25 (×21): 1000 mg via ORAL
  Filled 2024-04-03 (×23): qty 2

## 2024-04-03 MED ORDER — ZINC SULFATE 220 (50 ZN) MG PO CAPS
220.0000 mg | ORAL_CAPSULE | Freq: Every day | ORAL | Status: AC
  Administered 2024-04-04 – 2024-04-10 (×6): 220 mg via ORAL
  Filled 2024-04-03 (×8): qty 1

## 2024-04-03 NOTE — TOC Progression Note (Signed)
 Transition of Care [facility]) - Progression Note    Patient Details  Name: Fred Mcintosh MRN: [id number] Date of Birth: [birth year]  Transition of Care [facility]) CM/SW Contact  Inocente GORMAN Kindle, LCSW Phone Number: 04/03/2024, 4:22 PM  Clinical Narrative:    Per RNCM, patient not in LTACH criteria anymore as Cortrak was removed. CSW faxed out SNF referrals.      Barriers to Discharge: ED Claims of Negligence on the Part of Facility/Family               Expected Discharge Plan and Services In-house Referral: Clinical Social Work     Living arrangements for the past 2 months: Single Family Home                                       Social Drivers of Health (SDOH) Interventions SDOH Screenings   Food Insecurity: No Food Insecurity (03/08/2024)  Housing: Low Risk  (03/08/2024)  Transportation Needs: No Transportation Needs (03/08/2024)  Utilities: Not At Risk (03/08/2024)  Social Connections: Moderately Integrated (01/18/2024)  Tobacco Use: Unknown (03/27/2024)    Readmission Risk Interventions     No data to display

## 2024-04-03 NOTE — [place] FL2 (Signed)
 [place]  MEDICAID FL2 LEVEL OF CARE FORM     IDENTIFICATION  Patient Name: Fred Mcintosh Birthdate: 1960-10-18 Sex: male Admission Date (Current Location): 03/27/2024  [facility] and [place] Number:  [employer] and Address:  [facility] [place], [address] [place], [place] [address]      Provider Number: [id number]  Attending Physician Name and Address:  Elgergawy, Brayton RAMAN, MD  Relative Name and Phone Number:       Current Level of Care: Hospital Recommended Level of Care: Skilled Nursing Facility Prior Approval Number:    Date Approved/Denied: 04/03/24 PASRR Number: [id number]  Discharge Plan: SNF    Current Diagnoses: Patient Active Problem List   Diagnosis Date Noted   Streptococcal bacteremia 04/02/2024   Necrotizing fasciitis (HCC) 03/27/2024   Altered mental status 03/27/2024   Hyperosmolar hyperglycemic state (HHS) (HCC) 03/27/2024   Wound dehiscence, surgical, initial encounter 03/08/2024   Non-healing wound of amputation stump (HCC) 03/08/2024   New onset type 2 diabetes mellitus (HCC) 01/19/2024   Essential hypertension 01/19/2024   Obesity, Class III, BMI 40-49.9 (morbid obesity) 01/19/2024   Hyponatremia 01/19/2024   Septic shock (HCC) 01/19/2024   Cutaneous abscess of left foot 01/19/2024   Diabetic infection of left foot (HCC) 01/19/2024   Toe infection 01/18/2024   Anemia 07/18/2012   Acute respiratory failure with hypoxia (HCC) 07/18/2012   Community acquired pneumonia 07/13/2012   Hypokalemia 07/13/2012   Dehydration 07/13/2012   Thrombocytopenia (HCC) 07/13/2012    Orientation RESPIRATION BLADDER Height & Weight     Self (disoriented to time, situation, and place)  Normal Incontinent, External catheter Weight: 268 lb 4.8 oz (121.7 kg) Height:  6' 2 (188 cm)  BEHAVIORAL SYMPTOMS/MOOD NEUROLOGICAL BOWEL NUTRITION STATUS      Incontinent Diet (See DC summary)  AMBULATORY STATUS COMMUNICATION OF NEEDS  Skin   Extensive Assist Verbally PU Stage and Appropriate Care, Surgical wounds (3 pressure injury woulds, 2 surgically closed incisions)                       Personal Care Assistance Level of Assistance  Bathing, Feeding, Dressing Bathing Assistance: Maximum assistance Feeding assistance: Maximum assistance Dressing Assistance: Maximum assistance     Functional Limitations Info  Sight, Hearing, Speech Sight Info: Adequate Hearing Info: Adequate Speech Info: Impaired    SPECIAL CARE FACTORS FREQUENCY  PT (By licensed PT), OT (By licensed OT)     PT Frequency: 5x/week OT Frequency: 5x/week            Contractures Contractures Info: Present    Additional Factors Info  Code Status, Allergies Code Status Info: Full Allergies Info: NKA           Current Medications (04/03/2024):  This is the current hospital active medication list Current Facility-Administered Medications  Medication Dose Route Frequency Provider Last Rate Last Admin   0.9 %  sodium chloride  infusion (Manually program via Guardrails IV Fluids)   Intravenous Once Duda, Marcus V, MD       acetaminophen  (TYLENOL ) tablet 650 mg  650 mg Oral Q6H WA Paytes, Austin A, RPH   650 mg at 04/03/24 1446   albuterol  (PROVENTIL ) (2.5 MG/3ML) 0.083% nebulizer solution 2.5 mg  2.5 mg Nebulization Q6H PRN Harden Jerona GAILS, MD       [START ON 04/04/2024] ascorbic acid  (VITAMIN C ) tablet 1,000 mg  1,000 mg Oral Daily Paytes, Massie LABOR, RPH  Chlorhexidine  Gluconate Cloth 2 % PADS 6 each  6 each Topical Daily Harden Jerona GAILS, MD   6 each at 04/03/24 1000   dextrose  50 % solution 0-50 mL  0-50 mL Intravenous PRN Harden Jerona GAILS, MD       docusate sodium  (COLACE) capsule 100 mg  100 mg Oral BID Paytes, Austin A, RPH       feeding supplement (ENSURE PLUS HIGH PROTEIN) liquid 237 mL  237 mL Oral BID BM Elgergawy, Dawood S, MD   237 mL at 04/03/24 1447   fentaNYL  (SUBLIMAZE ) injection 25-50 mcg  25-50 mcg Intravenous Q2H PRN  Paytes, Austin A, RPH   50 mcg at 03/31/24 0746   heparin  injection 5,000 Units  5,000 Units Subcutaneous Q8H Harden Jerona GAILS, MD   5,000 Units at 04/03/24 1446   hydrALAZINE  (APRESOLINE ) injection 10 mg  10 mg Intravenous Q4H PRN Claudene Toribio BROCKS, MD   10 mg at 03/30/24 2105   insulin  aspart (novoLOG ) injection 0-15 Units  0-15 Units Subcutaneous TID WC Elgergawy, Brayton RAMAN, MD       insulin  aspart (novoLOG ) injection 0-5 Units  0-5 Units Subcutaneous QHS Elgergawy, Dawood S, MD       insulin  glargine (LANTUS ) injection 10 Units  10 Units Subcutaneous Q24H Elgergawy, Dawood S, MD   10 Units at 04/03/24 1456   labetalol  (NORMODYNE ) injection 10 mg  10 mg Intravenous Q2H PRN Smith, Daniel C, MD       leptospermum manuka honey (MEDIHONEY) paste 1 Application  1 Application Topical Daily Harden Jerona GAILS, MD   1 Application at 04/03/24 1000   [START ON 04/04/2024] levothyroxine  (SYNTHROID ) tablet 50 mcg  50 mcg Oral Q0600 PaytesMassie LABOR, RPH       methocarbamol  (ROBAXIN ) injection 500 mg  500 mg Intravenous Q8H PRN Claudene Toribio BROCKS, MD   500 mg at 04/01/24 9361   nutrition supplement (JUVEN) (JUVEN) powder packet 1 packet  1 packet Oral BID AC & HS Elgergawy, Dawood S, MD       ondansetron  (ZOFRAN ) injection 4 mg  4 mg Intravenous Q6H PRN Harden Jerona GAILS, MD   4 mg at 03/31/24 0746   Oral care mouth rinse  15 mL Mouth Rinse PRN Claudene Toribio BROCKS, MD   15 mL at 03/30/24 1533   oxyCODONE  (Oxy IR/ROXICODONE ) immediate release tablet 5 mg  5 mg Oral Q4H PRN Paytes, Austin A, RPH       pantoprazole  (PROTONIX ) injection 40 mg  40 mg Intravenous Q24H Singh, Prashant K, MD   40 mg at 04/03/24 1446   penicillin  G potassium 24 Million Units in dextrose  5 % 500 mL CONTINUOUS infusion  24 Million Units Intravenous Q24H Migdalia Feliciano RAMAN, RPH 20.8 mL/hr at 04/03/24 1501 24 Million Units at 04/03/24 1501   [START ON 04/04/2024] polyethylene glycol (MIRALAX  / GLYCOLAX ) packet 17 g  17 g Oral Daily Paytes, Massie LABOR, RPH        [START ON 04/04/2024] zinc  sulfate (50mg  elemental zinc ) capsule 220 mg  220 mg Oral Daily Paytes, Massie LABOR, RPH         Discharge Medications: Please see discharge summary for a list of discharge medications.  Relevant Imaging Results:  Relevant Lab Results:   Additional Information SSN: [id number]  Dashayla Theissen S Chavela Justiniano, LCSW

## 2024-04-03 NOTE — Progress Notes (Signed)
 Modified Barium Swallow Study  Patient Details  Name: Fred Mcintosh MRN: [id number] Date of Birth: [birth year]  Today's Date: 04/03/2024  Modified Barium Swallow completed.  Full report located under Chart Review in the Imaging Section.  History of Present Illness 63 yo male admitted 9/10 with AMS and weakness after not getting OOB for 9 days. 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 ETT 9/10-9/11. PMH includes: recent L foot amputation, DM, HTN, asthma, Graves disease   Clinical Impression Pt exhibits mild oropharyngeal dysphagia primarily affected by his impulsivity. There is adequate laryngeal closure but the swallow is initiated at the pyriform sinuses, contributing to trace penetration (PAS 3). A cued cough helps clear these penetrates. There is no significant residue but his inattention contributes to prolonged mastication and suspected timing deficits. Recommend starting with Dys 2 solids and thin liquids in an attempt to control rate and ensure adequate mastication. Discussed with MD and RN. Will continue following to assess readiness to advance diet given fluctuating mentation. Factors that may increase risk of adverse event in presence of aspiration Noe & Lianne 2021): Reduced cognitive function;Limited mobility  Swallow Evaluation Recommendations Recommendations: PO diet PO Diet Recommendation: Dysphagia 2 (Finely chopped);Thin liquids (Level 0) Liquid Administration via: Cup;Straw Medication Administration: Whole meds with puree Supervision: Patient able to self-feed;Full supervision/cueing for swallowing strategies Swallowing strategies  : Minimize environmental distractions;Slow rate;Small bites/sips Postural changes: Position pt fully upright for meals;Stay upright 30-60 min after meals Oral care recommendations: Oral care BID (2x/day)    Damien Blumenthal, M.A., CCC-SLP Speech Language Pathology, Acute Rehabilitation Services  Secure Chat  preferred [phone]  04/03/2024,11:48 AM

## 2024-04-03 NOTE — Progress Notes (Signed)
 Nutrition Brief Note  Consult for Cortrak placement noted this morning as patient pulled his overnight. RN and SLP both noted he has MBSS scheduled for 10:30AM this morning. Able to be upgraded to DYS2/thin liquid diet.  Discussed with MD. Will hold on Cortrak placement today and initiate calorie count to assess need for nutrition support.  Discussed with RN and calorie count envelope hung on patient's door.   INTERVENTION:  Hold TF and Cortrak placement to assess oral intake  Initiate calorie count to assess need for nutrition support   Continue Juven BID before breakfast and at bedtime, each packet provides 95 calories, 2.5 grams of protein (collagen), and 9.8 grams of carbohydrate (3 grams sugar); also contains 7 grams of L-arginine and L-glutamine, 300 mg vitamin C , 15 mg vitamin E, 1.2 mcg vitamin B-12, 9.5 mg zinc , 200 mg calcium , and 1.5 g  Calcium  Beta-hydroxy-Beta-methylbutyrate to support wound healing   Per MD, vitamin C  1000 mg x 30 days and 220mg  of zinc     Monitor SLP notes for diet advancement and tolerance  Add Ensure Plus High Protein po BID, each supplement provides 350 kcal and 20 grams of protein   Add Magic cup TID with meals, each supplement provides 290 kcal and 9 grams of protein    NUTRITION DIAGNOSIS:  Increased nutrient needs related to wound healing as evidenced by estimated needs. - remains applicable   GOAL:  Patient will meet greater than or equal to 90% of their needs - progressing   MONITOR:  Diet advancement, I & O's, Labs, Skin  Blair Deaner MS, RD, LDN Registered Dietitian Clinical Nutrition RD Inpatient Contact Info in Amion

## 2024-04-03 NOTE — Progress Notes (Signed)
 PROGRESS NOTE                                                                                                                                                                                                             Patient Demographics:    Fred Mcintosh, is a 63 y.o. male, DOB - 1961/04/16, FMW:[id number]  Outpatient Primary MD for the patient is [name], MD    LOS - 7  Admit date - 03/27/2024    Chief Complaint  Patient presents with   Altered Mental Status       Brief Narrative (HPI from H&P)    63 year old male with past medical history of diabetes, hypertension, asthma, hyperthyroidism/Graves', recent left foot amputation who presented to the emergency department on 9/10 AM with complaint of altered mental status. Per EDP, family called 911 after patient reportedly laying in bed for 9 days. With EMS, blood glucose out of range, noted coffee-ground emesis and dark tarry stools. On arrival to ED, tachycardic to 130s, lethargic. Left foot noted to be dehisced with exposed bone, foul drainage and subcutaneous emphysema up the shin. Labs notable for lactic acid 5.4, Na 128, K 5.9, Cl 89, bicarb 16, BG 816, BUN 101, sCr 2.57, alk phos 127, Tbili 3.3, AG 23, WBC 31.4, Hgb 9, plt 440, INR 1.5, BHB 3.71, UA with trace leukocytes. Blood cultures were drawn and pending. CXR with no acute abnormality. Left foot xray with air around the stump and lower half of leg. Obvious concern for necrotizing fasciitis with septic shock, HHS.   He was given 3L LR fluid resuscitation, started on Zyvox  and Zosyn , insulin  drip. Requiring low dose levophed  despite fluid resuscitation. Orthopedic surgery consulted. Dr. Harden to take him to OR later today. 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 Harden, seen by ID, PCCM and orthopedics stabilized and transferred to hospitalist  service under my care on 04/01/2024.     Significant Hospital Events: Including procedures, antibiotic start and stop dates in addition to other pertinent events           9/10: ED for septic shock vs left foot wound and nec fasc, HHS. Ortho to take to OR, CCM to admit ICU  9/11 POD#1 s/p L AKA, wound vac in place. Extubated  03/31/2024.  Echocardiogram with bubble study.  1. Left ventricular ejection fraction, by estimation, is 70 to 75%. The left ventricle has hyperdynamic function. Left ventricular endocardial border not optimally defined to evaluate regional wall motion. There is moderate concentric left ventricular hypertrophy. Left ventricular diastolic parameters are consistent with Grade I diastolic dysfunction (impaired relaxation).  2. Right ventricular systolic function was not well visualized. The right ventricular size is normal.  3. The mitral valve is normal in structure. No evidence of mitral valve regurgitation. No evidence of mitral stenosis.  4. There is a bright mobile target on the ventricular side of the TV leaflets that is worrisome for vegetation. The tricuspid valve is abnormal.  5. The aortic valve is tricuspid. Aortic valve regurgitation is not visualized. Aortic valve sclerosis/calcification is present, without any evidence of aortic stenosis. Aortic valve area, by VTI measures 3.16 cm. Aortic valve mean gradient measures 5.0 mmHg. Aortic valve Vmax measures 1.34 m/s.  6. The inferior vena cava is normal in size with greater than 50% respiratory variability, suggesting right atrial pressure of 3 mmHg.     Subjective:   Patient in bed, appears comfortable, denies any headache, no fever, no chest pain or pressure, no shortness of breath , he was confused overnight where he pulled his IVs and cortrak   Assessment  & Plan :   Sepsis 2/2 necrotizing fasciitis of acute on chronic left lower extremity wound, streptococcal bacteremia - he was admitted to ICU by PCCM, underwent  L  AKA 03/27/24 by Dr. Harden, has left AKA with a wound VAC, seen by ID as well, biotics management per ID.  - Echocardiogram shows questionable growth on tricuspid valve, but TEE negative for endocarditis   Acute Metabolic encephalopathy- due to #1 above, no headache or focal deficits, treat sepsis, appears dehydrated, will be hydrated, supportive care and monitor.  Dehydration, hypernatremia acute kidney injury - due to sepsis hydrate and monitor.  - Passed bedside swallow evaluation, encouraged to increase his fluid intake.  Ongoing dysphagia.  Still has NG tube from ER, mentation and cough reflex is good, requested speech to follow closely, hopefully will be transition to oral diet soon and we will discontinue NG tube.    Elevated Tbili- f/u LFTs this am  Hypothyroidism- continue PTA synthroid , needs OP f/u  History of hypertension no acute issues continue to monitor  History of asthma no wheezing, supportive care.  Hx of infrequent medical care- complicating control of above  Physical deconditioning.  PT OT, may require placement   DM2 with hyperglycemia -variable oral intake, will give low-dose Lantus  today and adjust as needed.     Lab Results  Component Value Date   HGBA1C 6.7 (H) 03/27/2024    CBG (last 3)  Recent Labs    04/03/24 0524 04/03/24 0813 04/03/24 1125  GLUCAP 188* 168* 175*         Condition - Extremely Guarded  Family Communication  : None present  Code Status : Full code  Consults  : PCCM, orthopedics, ID  PUD Prophylaxis :     Procedures  :            Disposition Plan  :    Status is: Inpatient   DVT Prophylaxis  :    SCD's Start: 03/27/24 1747 heparin  injection 5,000 Units Start: 03/27/24 1400    Lab Results  Component Value Date   PLT 151 04/03/2024    Diet :  Diet Order  DIET DYS 2 Room service appropriate? No; Fluid consistency: Thin  Diet effective now                    Inpatient  Medications  Scheduled Meds:  [MAR Hold] sodium chloride    Intravenous Once   [MAR Hold] acetaminophen   650 mg Oral Q6H WA   [MAR Hold] vitamin C   1,000 mg Oral Daily   [MAR Hold] Chlorhexidine  Gluconate Cloth  6 each Topical Daily   [MAR Hold] docusate sodium   100 mg Oral BID   [MAR Hold] feeding supplement (PROSource TF20)  60 mL Per Tube BID   [MAR Hold] free water   300 mL Per Tube Q4H   [MAR Hold] heparin   5,000 Units Subcutaneous Q8H   [MAR Hold] insulin  aspart  0-20 Units Subcutaneous Q4H   [MAR Hold] insulin  aspart  4 Units Subcutaneous Q4H   [MAR Hold] insulin  glargine  10 Units Subcutaneous Q24H   [MAR Hold] leptospermum manuka honey  1 Application Topical Daily   [MAR Hold] levothyroxine   50 mcg Oral Q0600   [MAR Hold] nutrition supplement (JUVEN)  1 packet Oral BID BM   [MAR Hold] pantoprazole  (PROTONIX ) IV  40 mg Intravenous Q24H   [MAR Hold] polyethylene glycol  17 g Oral Daily   [MAR Hold] zinc  sulfate (50mg  elemental zinc )  220 mg Oral Daily   Continuous Infusions:  feeding supplement (VITAL 1.5 CAL) Stopped (04/02/24 1900)   [MAR Hold] penicillin  G potassium 24 Million Units in dextrose  5 % 500 mL CONTINUOUS infusion     PRN Meds:.[MAR Hold] albuterol , [MAR Hold] dextrose , [MAR Hold] fentaNYL  (SUBLIMAZE ) injection, [MAR Hold] hydrALAZINE , [MAR Hold] labetalol , [MAR Hold] methocarbamol  (ROBAXIN ) injection, [MAR Hold] ondansetron  (ZOFRAN ) IV, [MAR Hold] mouth rinse, [MAR Hold] oxyCODONE     Objective:   Vitals:   04/03/24 0400 04/03/24 0500 04/03/24 0807 04/03/24 1126  BP: 118/71  124/76 127/79  Pulse: 82  80 88  Resp: (!) 9  14 (!) 21  Temp: 97.6 F (36.4 C)  97.6 F (36.4 C) 98 F (36.7 C)  TempSrc: Oral  Oral Oral  SpO2: 94%     Weight:  121.7 kg    Height:        Wt Readings from Last 3 Encounters:  04/03/24 121.7 kg  03/08/24 (!) 141.5 kg  01/18/24 (!) 153.3 kg     Intake/Output Summary (Last 24 hours) at 04/03/2024 1201 Last data filed at  04/03/2024 0810 Gross per 24 hour  Intake 810 ml  Output 2625 ml  Net -1815 ml     Physical Exam  Awake Alert, Oriented X 2, mildly confused, pleasant but answering most questions appropriately and follow commands Symmetrical Chest wall movement, Good air movement bilaterally, CTAB RRR,No Gallops,Rubs or new Murmurs, No Parasternal Heave +ve B.Sounds, Abd Soft, No tenderness, No rebound - guarding or rigidity. No Cyanosis ,left AKA, connected to wound VAC      Data Review:    Recent Labs  Lab 03/30/24 0825 03/31/24 0838 04/01/24 0427 04/02/24 0419 04/03/24 0357  WBC 13.6* 12.9* 10.6* 11.4* 10.4  HGB 9.0* 9.3* 8.7* 8.6* 9.0*  HCT 27.0* 29.0* 27.9* 28.5* 29.8*  PLT 160 152 138* 142* 151  MCV 85.2 87.3 91.5 94.4 95.2  MCH 28.4 28.0 28.5 28.5 28.8  MCHC 33.3 32.1 31.2 30.2 30.2  RDW 17.2* 17.2* 17.2* 17.5* 18.1*  LYMPHSABS  --   --   --  1.6 1.2  MONOABS  --   --   --  0.5 0.4  EOSABS  --   --   --  0.1 0.1  BASOSABS  --   --   --  0.0 0.0    Recent Labs  Lab 03/27/24 1319 03/27/24 1529 03/27/24 1534 03/27/24 2041 03/28/24 0421 03/28/24 0717 03/29/24 0503 03/29/24 1802 03/29/24 2320 03/30/24 0825 03/30/24 1528 03/30/24 2223 03/31/24 0838 04/01/24 0427 04/01/24 0630 04/01/24 1108 04/02/24 0419 04/03/24 0357  NA 129*  --    < >  --    < >  --    < >  --    < > 140   < > 144 147*  --  151*  --  151* 150*  K 4.2  --    < >  --    < >  --    < >  --    < > 4.1   < > 4.3 4.7  --  5.0  --  4.8 5.1  CL 94*  --   --   --    < >  --    < >  --    < > 104   < > 107 111  --  111  --  116* 118*  CO2 18*  --   --   --    < >  --    < >  --    < > 25   < > 25 26  --  27  --  26 27  ANIONGAP 17*  --   --   --    < >  --    < >  --    < > 11   < > 12 10  --  13  --  9 5  GLUCOSE 715*  --   --   --    < >  --    < >  --    < > 178*   < > 180* 187*  --  184*  --  123* 205*  BUN 110*  --   --   --    < >  --    < >  --    < > 93*   < > 97* 91*  --  91*  --  72* 61*   CREATININE 2.60*  --   --   --    < >  --    < >  --    < > 1.91*   < > 1.71* 1.59*  --  1.62*  --  1.47* 1.47*  AST  --   --   --  32  --   --    < >  --   --  21  --   --  19  --  25  --  18 17  ALT  --   --   --  13  --   --    < >  --   --  8  --   --  8  --  9  --  11 12  ALKPHOS  --   --   --  94  --   --    < >  --   --  70  --   --  75  --  71  --  67 66  BILITOT  --   --   --  3.5*  --   --    < >  --   --  1.6*  --   --  1.2  --  0.9  --  0.8 0.7  ALBUMIN  --   --   --  <1.5*  --   --    < >  --   --  <1.5*  --   --  1.5*  --  1.6*  --  1.6* 1.6*  CRP  --   --   --   --   --  28.4*  --   --   --   --   --   --   --   --   --  2.9* 1.7* 2.1*  PROCALCITON  --   --   --   --   --   --   --   --   --   --   --   --   --  13.19  --   --  7.59 5.00  LATICACIDVEN 7.6* 7.6*  --   --   --   --   --   --   --   --   --   --   --   --   --   --   --   --   TSH  --   --   --  7.797*  --   --   --   --   --   --   --   --   --   --   --   --   --   --   HGBA1C 6.7*  --   --   --   --   --   --   --   --   --   --   --   --   --   --   --   --   --   AMMONIA 27  --   --   --   --   --   --  21  --   --   --   --   --   --   --   --   --   --   MG  --   --   --  2.8*   < >  --    < >  --   --  2.9*  --   --  3.1* 3.1*  --   --  2.8* 2.7*  PHOS  --   --   --   --    < >  --    < >  --   --  2.9  --   --  2.8 3.5  --   --  4.0 4.0  CALCIUM  8.2*  --   --   --    < >  --    < >  --    < > 7.9*   < > 8.1* 8.4*  --  8.7*  --  8.5* 8.5*   < > = values in this interval not displayed.      Recent Labs  Lab 03/27/24 1319 03/27/24 1529 03/27/24 2041 03/28/24 0421 03/28/24 9282 03/29/24 0503 03/29/24 1802 03/29/24 2320 03/30/24 0825 03/30/24 1528 03/30/24 2223 03/31/24 9161 04/01/24 0427 04/01/24 0630 04/01/24 1108 04/02/24 0419 04/03/24 0357  CRP  --   --   --   --  28.4*  --   --   --   --   --   --   --   --   --  2.9* 1.7* 2.1*  PROCALCITON  --   --   --   --   --   --   --   --   --   --    --   --  13.19  --   --  7.59 5.00  LATICACIDVEN 7.6* 7.6*  --   --   --   --   --   --   --   --   --   --   --   --   --   --   --   TSH  --   --  7.797*  --   --   --   --   --   --   --   --   --   --   --   --   --   --   HGBA1C 6.7*  --   --   --   --   --   --   --   --   --   --   --   --   --   --   --   --   AMMONIA 27  --   --   --   --   --  21  --   --   --   --   --   --   --   --   --   --   MG  --   --  2.8*   < >  --    < >  --   --  2.9*  --   --  3.1* 3.1*  --   --  2.8* 2.7*  CALCIUM  8.2*  --   --    < >  --    < >  --    < > 7.9*   < > 8.1* 8.4*  --  8.7*  --  8.5* 8.5*   < > = values in this interval not displayed.    --------------------------------------------------------------------------------------------------------------- Lab Results  Component Value Date   CHOL 166 01/18/2024   HDL 15 (L) 01/18/2024   LDLCALC 127 (H) 01/18/2024   TRIG 169 (H) 03/28/2024   CHOLHDL 11.1 01/18/2024    Lab Results  Component Value Date   HGBA1C 6.7 (H) 03/27/2024      Micro Results Recent Results (from the past 240 hours)  Blood Culture (routine x 2)     Status: None   Collection Time: 03/27/24  9:09 AM   Specimen: BLOOD  Result Value Ref Range Status   Specimen Description BLOOD BLOOD LEFT HAND  Final   Special Requests   Final    Blood Culture adequate volume BOTTLES DRAWN AEROBIC AND ANAEROBIC   Culture   Final    NO GROWTH 5 DAYS Performed at [facility], [address], [place], [place] [address]    Report Status 04/01/2024 FINAL  Final  Blood Culture (routine x 2)     Status: Abnormal   Collection Time: 03/27/24  9:10 AM   Specimen: BLOOD  Result Value Ref Range Status   Specimen Description   Final    BLOOD BLOOD RIGHT HAND Performed at [facility], [address] [place], [place] [address]    Special Requests   Final    Blood Culture results may not be optimal due to an inadequate volume of blood received in culture bottles BOTTLES DRAWN AEROBIC AND  ANAEROBIC Performed at [facility]  [facility], [address] [place], [place] [address]    Culture  Setup Time   Final    AEROBIC BOTTLE ONLY GRAM POSITIVE COCCI IN CHAINS Gram Stain Report Called to,Read Back By and Verified With: E GANT AT 0952 ON 09.11.25 BY ADGER J  CRITICAL RESULT CALLED TO, READ BACK BY AND VERIFIED WITH: PHARMD BLAKE WANNARAT ON 03/28/24 @ 1506 BY DRT Performed at [facility], [address], [place], [place] [address]    Culture STREPTOCOCCUS ANGINOSIS (A)  Final   Report Status 03/31/2024 FINAL  Final   Organism ID, Bacteria STREPTOCOCCUS ANGINOSIS  Final      Susceptibility   Streptococcus anginosis - MIC*    PENICILLIN  <=0.06 SENSITIVE Sensitive     CEFTRIAXONE  <=0.12 SENSITIVE Sensitive     ERYTHROMYCIN <=0.12 SENSITIVE Sensitive     LEVOFLOXACIN  <=0.25 SENSITIVE Sensitive     VANCOMYCIN  0.5 SENSITIVE Sensitive     * STREPTOCOCCUS ANGINOSIS  Blood Culture ID Panel (Reflexed)     Status: Abnormal   Collection Time: 03/27/24  9:10 AM  Result Value Ref Range Status   Enterococcus faecalis NOT DETECTED NOT DETECTED Final   Enterococcus Faecium NOT DETECTED NOT DETECTED Final   Listeria monocytogenes NOT DETECTED NOT DETECTED Final   Staphylococcus species NOT DETECTED NOT DETECTED Final   Staphylococcus aureus (BCID) NOT DETECTED NOT DETECTED Final   Staphylococcus epidermidis NOT DETECTED NOT DETECTED Final   Staphylococcus lugdunensis NOT DETECTED NOT DETECTED Final   Streptococcus species DETECTED (A) NOT DETECTED Final    Comment: Not Enterococcus species, Streptococcus agalactiae, Streptococcus pyogenes, or Streptococcus pneumoniae. CRITICAL RESULT CALLED TO, READ BACK BY AND VERIFIED WITH: PHARMD BLAKE WANNARAT ON 03/28/24 @ 1506 BY DRT    Streptococcus agalactiae NOT DETECTED NOT DETECTED Final   Streptococcus pneumoniae NOT DETECTED NOT DETECTED Final   Streptococcus pyogenes NOT DETECTED NOT DETECTED Final   A.calcoaceticus-baumannii NOT DETECTED  NOT DETECTED Final   Bacteroides fragilis NOT DETECTED NOT DETECTED Final   Enterobacterales NOT DETECTED NOT DETECTED Final   Enterobacter cloacae complex NOT DETECTED NOT DETECTED Final   Escherichia coli NOT DETECTED NOT DETECTED Final   Klebsiella aerogenes NOT DETECTED NOT DETECTED Final   Klebsiella oxytoca NOT DETECTED NOT DETECTED Final   Klebsiella pneumoniae NOT DETECTED NOT DETECTED Final   Proteus species NOT DETECTED NOT DETECTED Final   Salmonella species NOT DETECTED NOT DETECTED Final   Serratia marcescens NOT DETECTED NOT DETECTED Final   Haemophilus influenzae NOT DETECTED NOT DETECTED Final   Neisseria meningitidis NOT DETECTED NOT DETECTED Final   Pseudomonas aeruginosa NOT DETECTED NOT DETECTED Final   Stenotrophomonas maltophilia NOT DETECTED NOT DETECTED Final   Candida albicans NOT DETECTED NOT DETECTED Final   Candida auris NOT DETECTED NOT DETECTED Final   Candida glabrata NOT DETECTED NOT DETECTED Final   Candida krusei NOT DETECTED NOT DETECTED Final   Candida parapsilosis NOT DETECTED NOT DETECTED Final   Candida tropicalis NOT DETECTED NOT DETECTED Final   Cryptococcus neoformans/gattii NOT DETECTED NOT DETECTED Final    Comment: Performed at [facility], [address], [place], [place] [address]  MRSA Next Gen by PCR, Nasal     Status: None   Collection Time: 03/27/24 12:02 PM   Specimen: Nasal Mucosa; Nasal Swab  Result Value Ref Range Status   MRSA by PCR Next Gen NOT DETECTED NOT DETECTED Final    Comment: (NOTE) The GeneXpert MRSA Assay (FDA approved for NASAL specimens only),  is one component of a comprehensive MRSA colonization surveillance program. It is not intended to diagnose MRSA infection nor to guide or monitor treatment for MRSA infections. Test performance is not FDA approved in patients less than 64 years old. Performed at [facility], [address], [place], [place] [address]   Culture, blood (Routine X 2) w Reflex  to ID Panel     Status: None   Collection Time: 03/29/24 10:52 AM   Specimen: BLOOD LEFT HAND  Result Value Ref Range Status   Specimen Description BLOOD LEFT HAND  Final   Special Requests   Final    BOTTLES DRAWN AEROBIC AND ANAEROBIC Blood Culture results may not be optimal due to an inadequate volume of blood received in culture bottles   Culture   Final    NO GROWTH 5 DAYS Performed at [facility], [address], [place], [place] [address]    Report Status 04/03/2024 FINAL  Final  Culture, blood (Routine X 2) w Reflex to ID Panel     Status: None   Collection Time: 03/29/24 10:52 AM   Specimen: BLOOD LEFT HAND  Result Value Ref Range Status   Specimen Description BLOOD LEFT HAND  Final   Special Requests   Final    BOTTLES DRAWN AEROBIC AND ANAEROBIC Blood Culture results may not be optimal due to an inadequate volume of blood received in culture bottles   Culture   Final    NO GROWTH 5 DAYS Performed at [facility], [address] [place], [place] [address]    Report Status 04/03/2024 FINAL  Final    Radiology Report DG Swallowing Func-Speech Pathology Result Date: 04/03/2024 Table formatting from the original result was not included. Modified Barium Swallow Study Patient Details Name: KEYLON LABELLE MRN: [id number] Date of Birth: 01/31/1961 Today's Date: 04/03/2024 HPI/PMH: HPI: 63 yo male admitted 9/10 with AMS and weakness after not getting OOB for 9 days. 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 ETT 9/10-9/11. PMH includes: recent L foot amputation, DM, HTN, asthma, Graves disease Clinical Impression: Clinical Impression: Pt exhibits mild oropharyngeal dysphagia primarily affected by his impulsivity. There is adequate laryngeal closure but the swallow is initiated at the pyriform sinuses, contributing to trace penetration (PAS 3). A cued cough helps clear these penetrates. There is no significant residue but his inattention contributes  to prolonged mastication and suspected timing deficits. Recommend starting with Dys 2 solids and thin liquids in an attempt to control rate and ensure adequate mastication. Discussed with MD and RN. Will continue following to assess readiness to advance diet given fluctuating mentation. Factors that may increase risk of adverse event in presence of aspiration Noe & Lianne 2021): Factors that may increase risk of adverse event in presence of aspiration Noe & Lianne 2021): Reduced cognitive function; Limited mobility Recommendations/Plan: Swallowing Evaluation Recommendations Swallowing Evaluation Recommendations Recommendations: PO diet PO Diet Recommendation: Dysphagia 2 (Finely chopped); Thin liquids (Level 0) Liquid Administration via: Cup; Straw Medication Administration: Whole meds with puree Supervision: Patient able to self-feed; Full supervision/cueing for swallowing strategies Swallowing strategies  : Minimize environmental distractions; Slow rate; Small bites/sips Postural changes: Position pt fully upright for meals; Stay upright 30-60 min after meals Oral care recommendations: Oral care BID (2x/day) Treatment Plan Treatment Plan Treatment recommendations: Therapy as outlined in treatment plan below Follow-up recommendations: Skilled nursing-short term rehab (<3 hours/day) Functional status assessment: Patient has had a recent decline in their  functional status and demonstrates the ability to make significant improvements in function in a reasonable and predictable amount of time. Treatment frequency: Min 2x/week Treatment duration: 2 weeks Interventions: Aspiration precaution training; Patient/family education; Trials of upgraded texture/liquids; Diet toleration management by SLP Recommendations Recommendations for follow up therapy are one component of a multi-disciplinary discharge planning process, led by the attending physician.  Recommendations may be updated based on patient status,  additional functional criteria and insurance authorization. Assessment: Orofacial Exam: Orofacial Exam Oral Cavity: Oral Hygiene: WFL Oral Cavity - Dentition: Adequate natural dentition Orofacial Anatomy: WFL Oral Motor/Sensory Function: WFL Anatomy: Anatomy: Suspected cervical osteophytes Boluses Administered: Boluses Administered Boluses Administered: Thin liquids (Level 0); Mildly thick liquids (Level 2, nectar thick); Moderately thick liquids (Level 3, honey thick); Puree; Solid  Oral Impairment Domain: Oral Impairment Domain Lip Closure: No labial escape Tongue control during bolus hold: Posterior escape of less than half of bolus Bolus preparation/mastication: Timely and efficient chewing and mashing Bolus transport/lingual motion: Brisk tongue motion Oral residue: Complete oral clearance Location of oral residue : N/A Initiation of pharyngeal swallow : Pyriform sinuses  Pharyngeal Impairment Domain: Pharyngeal Impairment Domain Soft palate elevation: No bolus between soft palate (SP)/pharyngeal wall (PW) Laryngeal elevation: Complete superior movement of thyroid  cartilage with complete approximation of arytenoids to epiglottic petiole Anterior hyoid excursion: Complete anterior movement Epiglottic movement: Complete inversion Laryngeal vestibule closure: Complete, no air/contrast in laryngeal vestibule Pharyngeal stripping wave : Present - complete Pharyngeal contraction (A/P view only): N/A Pharyngoesophageal segment opening: -- (unable to visualize) Tongue base retraction: No contrast between tongue base and posterior pharyngeal wall (PPW) Pharyngeal residue: Trace residue within or on pharyngeal structures Location of pharyngeal residue: Valleculae  Esophageal Impairment Domain: No data recorded Pill: No data recorded Penetration/Aspiration Scale Score: Penetration/Aspiration Scale Score 1.  Material does not enter airway: Mildly thick liquids (Level 2, nectar thick); Moderately thick liquids (Level 3,  honey thick); Puree; Solid 3.  Material enters airway, remains ABOVE vocal cords and not ejected out: Thin liquids (Level 0) Compensatory Strategies: Compensatory Strategies Compensatory strategies: No   General Information: Caregiver present: No  Diet Prior to this Study: NPO   Temperature : Normal   Respiratory Status: WFL   Supplemental O2: None (Room air)   History of Recent Intubation: Yes  Behavior/Cognition: Alert; Cooperative Self-Feeding Abilities: Able to self-feed Baseline vocal quality/speech: Normal Volitional Cough: Able to elicit Volitional Swallow: Able to elicit Exam Limitations: Limited visibility Goal Planning: Prognosis for improved oropharyngeal function: Good Barriers to Reach Goals: Cognitive deficits No data recorded Patient/Family Stated Goal: asking for food Consulted and agree with results and recommendations: Patient; Physician; Nurse; Dietitian Pain: Pain Assessment Pain Assessment: No/denies pain Faces Pain Scale: 0 Breathing: 0 Negative Vocalization: 0 Facial Expression: 0 Body Language: 0 Consolability: 0 PAINAD Score: 0 Pain Location: pain response with movement of LLE Pain Descriptors / Indicators: Grimacing; Guarding Pain Intervention(s): Monitored during session End of Session: Start Time:SLP Start Time (ACUTE ONLY): 1040 Stop Time: SLP Stop Time (ACUTE ONLY): 1100 Time Calculation:SLP Time Calculation (min) (ACUTE ONLY): 20 min Charges: SLP Evaluations $ SLP Speech Visit: 1 Visit SLP Evaluations $MBS Swallow: 1 Procedure $Swallowing Treatment: 1 Procedure SLP visit diagnosis: SLP Visit Diagnosis: Dysphagia, oropharyngeal phase (R13.12) Past Medical History: Past Medical History: Diagnosis Date  Acute respiratory failure with hypoxia (HCC) 07/13/2012  Secondary to multi-lobar bilateral pneumonia  Anemia   Asthma   Community acquired pneumonia 07/13/2012  Bilateral/multi-lobar  Diabetes mellitus without complication (HCC)  type 2  Graves disease   Hypertension  Past Surgical  History: Past Surgical History: Procedure Laterality Date  AMPUTATION Left 03/08/2024  Procedure: LEFT TRANSMETATARSAL AMPUTATION;  Surgeon: Harden Jerona GAILS, MD;  Location: [facility] OR;  Service: Orthopedics;  Laterality: Left;  AMPUTATION Left 03/27/2024  Procedure: AMPUTATION, ABOVE KNEE LEFT;  Surgeon: Harden Jerona GAILS, MD;  Location: [facility] OR;  Service: Orthopedics;  Laterality: Left;  AMPUTATION TOE Left 01/20/2024  Procedure: AMPUTATION, TOE;  Surgeon: Harden Jerona GAILS, MD;  Location: [facility] OR;  Service: Orthopedics;  Laterality: Left;  LEFT FOOT 2ND RAY  TRANSESOPHAGEAL ECHOCARDIOGRAM (CATH LAB) N/A 04/02/2024  Procedure: TRANSESOPHAGEAL ECHOCARDIOGRAM;  Surgeon: Shlomo Wilbert SAUNDERS, MD;  Location: [facility] INVASIVE CV LAB;  Service: Cardiovascular;  Laterality: N/A; Damien Blumenthal, M.A., CCC-SLP Speech Language Pathology, Acute Rehabilitation Services Secure Chat preferred [phone] 04/03/2024, 11:48 AM  ECHO TEE Result Date: 04/02/2024    TRANSESOPHOGEAL ECHO REPORT   Patient Name:   Fred Mcintosh Date of Exam: 04/02/2024 Medical Rec #:  [id number]          Height:       74.0 in Accession #:    [id number]         Weight:       274.7 lb Date of Birth:  1961-03-18         BSA:          2.487 m Patient Age:    62 years           BP:           109/88 mmHg Patient Gender: M                  HR:           92 bpm. Exam Location:  Inpatient Procedure: Transesophageal Echo, Cardiac Doppler and Color Doppler (Both            Spectral and Color Flow Doppler were utilized during procedure). Indications:     Subacute bacterial endocarditis;  History:         Patient has prior history of Echocardiogram examinations, most                  recent 03/31/2024. Signs/Symptoms:Altered Mental Status and                  Bacteremia; Risk Factors:Hypertension.  Sonographer:     Ellouise Mose RDCS Referring Phys:  [id number] WADDELL A PARCELLS Diagnosing Phys: Wilbert Shlomo MD PROCEDURE: After discussion of the risks and benefits of a TEE, an informed consent was  obtained from a family member. TEE procedure time was 6 minutes. The transesophogeal probe was passed without difficulty through the esophogus of the patient. Imaged were obtained with the patient in a left lateral decubitus position. Local oropharyngeal anesthetic was provided with viscous lidocaine . Sedation performed by different physician. The patient was monitored while under deep sedation. Anesthestetic sedation was provided intravenously by Anesthesiology: 220mg  of Propofol . Image quality was good. The patient's vital signs; including heart rate, blood pressure, and oxygen saturation; remained stable throughout the procedure. The patient developed no complications during the procedure. Transgastric views not obtained: inability to pass probe.  IMPRESSIONS  1. Left ventricular ejection fraction, by estimation, is 65 to 70%. The left ventricle has normal function. The left ventricle has no regional wall motion abnormalities.  2. Right ventricular systolic function is normal. The right ventricular size is normal.  3. No left atrial/left atrial appendage thrombus was  detected. The LAA emptying velocity was 87 cm/s.  4. The mitral valve is normal in structure. Trivial mitral valve regurgitation. No evidence of mitral stenosis.  5. The aortic valve is tricuspid. Aortic valve regurgitation is not visualized. No aortic stenosis is present.  6. Aortic dilatation noted. There is mild dilatation of the ascending aorta, measuring 38 mm.  7. The inferior vena cava is normal in size with greater than 50% respiratory variability, suggesting right atrial pressure of 3 mmHg. Conclusion(s)/Recommendation(s): Normal biventricular function without evidence of hemodynamically significant valvular heart disease. No evidence of vegetation/infective endocarditis on this transesophageael echocardiogram. FINDINGS  Left Ventricle: Left ventricular ejection fraction, by estimation, is 65 to 70%. The left ventricle has normal function.  The left ventricle has no regional wall motion abnormalities. The left ventricular internal cavity size was normal in size. There is  no left ventricular hypertrophy. Right Ventricle: The right ventricular size is normal. No increase in right ventricular wall thickness. Right ventricular systolic function is normal. Left Atrium: Left atrial size was normal in size. No left atrial/left atrial appendage thrombus was detected. The LAA emptying velocity was 87 cm/s. Right Atrium: Right atrial size was normal in size. Pericardium: There is no evidence of pericardial effusion. Mitral Valve: The mitral valve is normal in structure. Trivial mitral valve regurgitation. No evidence of mitral valve stenosis. Tricuspid Valve: The tricuspid valve is normal in structure. Tricuspid valve regurgitation is trivial. No evidence of tricuspid stenosis. Aortic Valve: The aortic valve is tricuspid. Aortic valve regurgitation is not visualized. No aortic stenosis is present. Pulmonic Valve: The pulmonic valve was normal in structure. Pulmonic valve regurgitation is trivial. No evidence of pulmonic stenosis. Aorta: Aortic dilatation noted. There is mild dilatation of the ascending aorta, measuring 38 mm. Venous: The inferior vena cava is normal in size with greater than 50% respiratory variability, suggesting right atrial pressure of 3 mmHg. IAS/Shunts: No atrial level shunt detected by color flow Doppler. Additional Comments: Spectral Doppler performed. LEFT VENTRICLE PLAX 2D LVOT diam:     2.60 cm LVOT Area:     5.31 cm   AORTA Ao Root diam: 3.67 cm Ao Asc diam:  3.80 cm  SHUNTS Systemic Diam: 2.60 cm Wilbert Bihari MD Electronically signed by Wilbert Bihari MD Signature Date/Time: 04/02/2024/4:20:10 PM    Final    EP STUDY Result Date: 04/02/2024 See surgical note for result.  DG Chest Port 1 View Result Date: 04/02/2024 CLINICAL DATA:  Shortness of breath. EXAM: PORTABLE CHEST 1 VIEW COMPARISON:  03/27/2024 FINDINGS:  Cardiopericardial silhouette is at upper limits of normal for size. The lungs are clear without focal pneumonia, edema, pneumothorax or pleural effusion. A feeding tube passes into the stomach although the distal tip position is not included on the film. No acute bony abnormality. Telemetry leads overlie the chest. IMPRESSION: No acute cardiopulmonary findings. Electronically Signed   By: Camellia Candle M.D.   On: 04/02/2024 06:52     Signature  -   Brayton Lye M.D on 04/03/2024 at 12:01 PM   -  To page go to [email or web address]

## 2024-04-04 DIAGNOSIS — B955 Unspecified streptococcus as the cause of diseases classified elsewhere: Secondary | ICD-10-CM

## 2024-04-04 DIAGNOSIS — R7881 Bacteremia: Secondary | ICD-10-CM | POA: Diagnosis not present

## 2024-04-04 DIAGNOSIS — M726 Necrotizing fasciitis: Secondary | ICD-10-CM | POA: Diagnosis not present

## 2024-04-04 DIAGNOSIS — E0500 Thyrotoxicosis with diffuse goiter without thyrotoxic crisis or storm: Secondary | ICD-10-CM | POA: Diagnosis not present

## 2024-04-04 LAB — COMPREHENSIVE METABOLIC PANEL WITH GFR
ALT: 14 U/L (ref 0–44)
AST: 18 U/L (ref 15–41)
Albumin: 1.9 g/dL — ABNORMAL LOW (ref 3.5–5.0)
Alkaline Phosphatase: 81 U/L (ref 38–126)
Anion gap: 11 (ref 5–15)
BUN: 51 mg/dL — ABNORMAL HIGH (ref 8–23)
CO2: 25 mmol/L (ref 22–32)
Calcium: 8.8 mg/dL — ABNORMAL LOW (ref 8.9–10.3)
Chloride: 113 mmol/L — ABNORMAL HIGH (ref 98–111)
Creatinine, Ser: 1.49 mg/dL — ABNORMAL HIGH (ref 0.61–1.24)
GFR, Estimated: 53 mL/min — ABNORMAL LOW (ref >60)
Glucose, Bld: 213 mg/dL — ABNORMAL HIGH (ref 70–99)
Potassium: 4.6 mmol/L (ref 3.5–5.1)
Sodium: 149 mmol/L — ABNORMAL HIGH (ref 135–145)
Total Bilirubin: 1.1 mg/dL (ref 0.0–1.2)
Total Protein: 6.7 g/dL (ref 6.5–8.1)

## 2024-04-04 LAB — GLUCOSE, CAPILLARY
Glucose-Capillary: 210 mg/dL — ABNORMAL HIGH (ref 70–99)
Glucose-Capillary: 232 mg/dL — ABNORMAL HIGH (ref 70–99)
Glucose-Capillary: 256 mg/dL — ABNORMAL HIGH (ref 70–99)
Glucose-Capillary: 270 mg/dL — ABNORMAL HIGH (ref 70–99)
Glucose-Capillary: 248 mg/dL — ABNORMAL HIGH (ref 70–99)

## 2024-04-04 LAB — CBC WITH DIFFERENTIAL/PLATELET
Abs Immature Granulocytes: 0.06 K/uL (ref 0.00–0.07)
Basophils Absolute: 0 K/uL (ref 0.0–0.1)
Basophils Relative: 0 %
Eosinophils Absolute: 0.1 K/uL (ref 0.0–0.5)
Eosinophils Relative: 1 %
HCT: 32.2 % — ABNORMAL LOW (ref 39.0–52.0)
Hemoglobin: 9.6 g/dL — ABNORMAL LOW (ref 13.0–17.0)
Immature Granulocytes: 1 %
Lymphocytes Relative: 12 %
Lymphs Abs: 1.2 K/uL (ref 0.7–4.0)
MCH: 28.4 pg (ref 26.0–34.0)
MCHC: 29.8 g/dL — ABNORMAL LOW (ref 30.0–36.0)
MCV: 95.3 fL (ref 80.0–100.0)
Monocytes Absolute: 0.4 K/uL (ref 0.1–1.0)
Monocytes Relative: 4 %
Neutro Abs: 8.2 K/uL — ABNORMAL HIGH (ref 1.7–7.7)
Neutrophils Relative %: 82 %
Platelets: 185 K/uL (ref 150–400)
RBC: 3.38 MIL/uL — ABNORMAL LOW (ref 4.22–5.81)
RDW: 18.4 % — ABNORMAL HIGH (ref 11.5–15.5)
WBC: 10 K/uL (ref 4.0–10.5)
nRBC: 0 % (ref 0.0–0.2)

## 2024-04-04 LAB — MAGNESIUM: Magnesium: 2.7 mg/dL — ABNORMAL HIGH (ref 1.7–2.4)

## 2024-04-04 LAB — C-REACTIVE PROTEIN: CRP: 3.9 mg/dL — ABNORMAL HIGH (ref <1.0)

## 2024-04-04 LAB — PROCALCITONIN: Procalcitonin: 3.29 ng/mL

## 2024-04-04 LAB — PHOSPHORUS: Phosphorus: 3.6 mg/dL (ref 2.5–4.6)

## 2024-04-04 MED ORDER — SODIUM CHLORIDE 0.45 % IV SOLN: INTRAVENOUS | Status: DC

## 2024-04-04 MED ORDER — INSULIN GLARGINE 100 UNIT/ML ~~LOC~~ SOLN
15.0000 [IU] | SUBCUTANEOUS | Status: DC
  Administered 2024-04-05 – 2024-04-24 (×20): 15 [IU] via SUBCUTANEOUS
  Filled 2024-04-04 (×22): qty 0.15

## 2024-04-04 MED ORDER — INSULIN GLARGINE 100 UNIT/ML ~~LOC~~ SOLN
5.0000 [IU] | Freq: Once | SUBCUTANEOUS | Status: AC
  Administered 2024-04-04: 5 [IU] via SUBCUTANEOUS
  Filled 2024-04-04: qty 0.05

## 2024-04-04 NOTE — Plan of Care (Signed)
 Orthopedic MD d/c wound vac and recommended to place customized shrinker for pt. Order placed by MD. IV fluid is continuously. Foley catheter clean/dry/ intact. Attempted to take pictures of pt's wound and pt refused. Sitter at the bedside. No pain reported. Sepsis screening negative and continue to monitor.   Problem: Education: Goal: Knowledge of General Education information will improve Description: Including pain rating scale, medication(s)/side effects and non-pharmacologic comfort measures Outcome: Progressing   Problem: Health Behavior/Discharge Planning: Goal: Ability to manage health-related needs will improve Outcome: Progressing   Problem: Clinical Measurements: Goal: Ability to maintain clinical measurements within normal limits will improve Outcome: Progressing Goal: Will remain free from infection Outcome: Progressing Goal: Diagnostic test results will improve Outcome: Progressing Goal: Respiratory complications will improve Outcome: Progressing Goal: Cardiovascular complication will be avoided Outcome: Progressing   Problem: Activity: Goal: Risk for activity intolerance will decrease Outcome: Progressing   Problem: Nutrition: Goal: Adequate nutrition will be maintained Outcome: Progressing   Problem: Coping: Goal: Level of anxiety will decrease Outcome: Progressing   Problem: Elimination: Goal: Will not experience complications related to bowel motility Outcome: Progressing Goal: Will not experience complications related to urinary retention Outcome: Progressing   Problem: Pain Managment: Goal: General experience of comfort will improve and/or be controlled Outcome: Progressing   Problem: Safety: Goal: Ability to remain free from injury will improve Outcome: Progressing   Problem: Skin Integrity: Goal: Risk for impaired skin integrity will decrease Outcome: Progressing   Problem: Education: Goal: Ability to describe self-care measures that may  prevent or decrease complications (Diabetes Survival Skills Education) will improve Outcome: Progressing Goal: Individualized Educational Video(s) Outcome: Progressing   Problem: Coping: Goal: Ability to adjust to condition or change in health will improve Outcome: Progressing   Problem: Fluid Volume: Goal: Ability to maintain a balanced intake and output will improve Outcome: Progressing   Problem: Health Behavior/Discharge Planning: Goal: Ability to identify and utilize available resources and services will improve Outcome: Progressing Goal: Ability to manage health-related needs will improve Outcome: Progressing   Problem: Metabolic: Goal: Ability to maintain appropriate glucose levels will improve Outcome: Progressing   Problem: Nutritional: Goal: Maintenance of adequate nutrition will improve Outcome: Progressing Goal: Progress toward achieving an optimal weight will improve Outcome: Progressing   Problem: Skin Integrity: Goal: Risk for impaired skin integrity will decrease Outcome: Progressing   Problem: Tissue Perfusion: Goal: Adequacy of tissue perfusion will improve Outcome: Progressing   Problem: Education: Goal: Knowledge of the prescribed therapeutic regimen will improve Outcome: Progressing Goal: Ability to verbalize activity precautions or restrictions will improve Outcome: Progressing Goal: Understanding of discharge needs will improve Outcome: Progressing   Problem: Activity: Goal: Ability to perform//tolerate increased activity and mobilize with assistive devices will improve Outcome: Progressing   Problem: Clinical Measurements: Goal: Postoperative complications will be avoided or minimized Outcome: Progressing   Problem: Self-Care: Goal: Ability to meet self-care needs will improve Outcome: Progressing   Problem: Self-Concept: Goal: Ability to maintain and perform role responsibilities to the fullest extent possible will improve Outcome:  Progressing   Problem: Pain Management: Goal: Pain level will decrease with appropriate interventions Outcome: Progressing   Problem: Skin Integrity: Goal: Demonstration of wound healing without infection will improve Outcome: Progressing   Problem: Activity: Goal: Ability to tolerate increased activity will improve Outcome: Progressing   Problem: Respiratory: Goal: Ability to maintain a clear airway and adequate ventilation will improve Outcome: Progressing   Problem: Role Relationship: Goal: Method of communication will improve Outcome: Progressing   Problem: Safety:  Goal: Non-violent Restraint(s) Outcome: Progressing

## 2024-04-04 NOTE — TOC Progression Note (Signed)
 Transition of Care [facility]) - Progression Note    Patient Details  Name: Fred Mcintosh MRN: [id number] Date of Birth: 11/04/1960  [facility]) CM/SW Contact  Inocente GORMAN Kindle, LCSW Phone Number: 04/04/2024, 9:37 AM  Clinical Narrative:    Requested [facility] review referral. Their liaison will come evaluate patient.      Barriers to Discharge: ED Claims of Negligence on the Part of Facility/Family               Expected Discharge Plan and Services In-house Referral: Clinical Social Work     Living arrangements for the past 2 months: Single Family Home                                       Social Drivers of Health (SDOH) Interventions SDOH Screenings   Food Insecurity: No Food Insecurity (03/08/2024)  Housing: Low Risk  (03/08/2024)  Transportation Needs: No Transportation Needs (03/08/2024)  Utilities: Not At Risk (03/08/2024)  Social Connections: Moderately Integrated (01/18/2024)  Tobacco Use: Unknown (03/27/2024)    Readmission Risk Interventions     No data to display

## 2024-04-04 NOTE — Progress Notes (Signed)
 Patient ID: Fred Mcintosh, male   DOB: July 24, 1960, 63 y.o.   MRN: [id number] Examination of the left above-knee amputation the incision has a very small amount of serosanguineous drainage.  Recommend a Mepilex type dressing and I will place an order for [employer] for a custom shrinker.

## 2024-04-04 NOTE — Progress Notes (Signed)
 Physical Therapy Treatment Patient Details Name: Fred Mcintosh MRN: [id number] DOB: 08/14/1960 Today's Date: 04/04/2024   History of Present Illness 63 y.o. 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 PMHx: Lt TMA 03/08/24, T2DM, HTN, obesity, Graves' disease    PT Comments  Pt with slowly improving cognition oriented today but with delayed response and decreased safety awareness. Pt able to assist with transfer to EOB and initiate lateral scooting and rising and RLE today with continued max +2 assist. Will continue to follow and attempt to lift OOB. Pt encouraged to continue HEP. Pillow under left hip end of session for sacral pressure relief.  Patient will benefit from continued inpatient follow up therapy, <3 hours/day    If plan is discharge home, recommend the following: Two people to help with walking and/or transfers;Two people to help with bathing/dressing/bathroom;Direct supervision/assist for medications management;Assistance with feeding;Assistance with cooking/housework;Direct supervision/assist for financial management;Supervision due to cognitive status;Assist for transportation;Help with stairs or ramp for entrance   Can travel by private vehicle     No  Equipment Recommendations  Wheelchair (measurements PT);Wheelchair cushion (measurements PT);BSC/3in1;Hospital bed;Hoyer lift    Recommendations for Other Services       Precautions / Restrictions Precautions Precautions: Fall;Other (comment) Precaution/Restrictions Comments: Lt AKA, sacral wound Restrictions LLE Weight Bearing Per Provider Order: Non weight bearing     Mobility  Bed Mobility Overal bed mobility: Needs Assistance Bed Mobility: Rolling, Supine to Sit, Sit to Supine Rolling: Mod assist   Supine to sit: Max assist, HOB elevated, Used rails     General bed mobility comments: mod assist to roll bil  for pad placement. max assist with HOB 25 degrees to pivot to EOB. max +2 to return to supine. Mod +2 assist to scoot EOB with pad with max multimodal cues    Transfers Overall transfer level: Needs assistance   Transfers: Sit to/from Stand Sit to Stand: Max assist, +2 physical assistance, +2 safety/equipment, From elevated surface           General transfer comment: from elevated surface able to initiate standing with max +2 assist and use of pad. RLE blocked with heavy assist for anterior translation and rise. Pt could clear sacrum but could not extend hip or fully rise x 4 trials    Ambulation/Gait               General Gait Details: unable   Stairs             Wheelchair Mobility     Tilt Bed    Modified Rankin (Stroke Patients Only)       Balance Overall balance assessment: Needs assistance Sitting-balance support: Feet supported, No upper extremity supported Sitting balance-Leahy Scale: Fair Sitting balance - Comments: static sitting CGA                                    Communication Communication Communication: No apparent difficulties  Cognition Arousal: Alert Behavior During Therapy: Flat affect   PT - Cognitive impairments: No family/caregiver present to determine baseline, Sequencing, Problem solving, Safety/Judgement, Awareness, Memory, Difficult to assess, Attention                       PT - Cognition Comments: pt A&Ox 4 this session, maintains poor insight, delayed command  following and limited awareness of function and progression Following commands: Impaired Following commands impaired: Follows one step commands inconsistently, Follows one step commands with increased time    Cueing Cueing Techniques: Verbal cues, Visual cues, Tactile cues, Gestural cues  Exercises General Exercises - Lower Extremity Long Arc Quad: AAROM, Right, Seated, 10 reps, Strengthening, 15 reps Hip Flexion/Marching: AAROM, Right,  Seated, Strengthening, 15 reps    General Comments        Pertinent Vitals/Pain Pain Assessment Pain Assessment: Faces Pain Score: 4  Pain Location: LLE with pressure at end of limb with transfers Pain Descriptors / Indicators: Grimacing, Guarding Pain Intervention(s): Limited activity within patient's tolerance, Monitored during session, Repositioned    Home Living                          Prior Function            PT Goals (current goals can now be found in the care plan section) Progress towards PT goals: Progressing toward goals    Frequency    Min 2X/week      PT Plan      Co-evaluation              AM-PAC PT 6 Clicks Mobility   Outcome Measure  Help needed turning from your back to your side while in a flat bed without using bedrails?: A Lot Help needed moving from lying on your back to sitting on the side of a flat bed without using bedrails?: Total Help needed moving to and from a bed to a chair (including a wheelchair)?: Total Help needed standing up from a chair using your arms (e.g., wheelchair or bedside chair)?: Total Help needed to walk in hospital room?: Total Help needed climbing 3-5 steps with a railing? : Total 6 Click Score: 7    End of Session   Activity Tolerance: Patient tolerated treatment well Patient left: in bed;with call bell/phone within reach;with nursing/sitter in room;with bed alarm set Nurse Communication: Mobility status;Need for lift equipment PT Visit Diagnosis: Difficulty in walking, not elsewhere classified (R26.2);Unsteadiness on feet (R26.81);Other abnormalities of gait and mobility (R26.89);Muscle weakness (generalized) (M62.81)     Time: 0801-0820 PT Time Calculation (min) (ACUTE ONLY): 19 min  Charges:    $Therapeutic Activity: 8-22 mins PT General Charges $$ ACUTE PT VISIT: 1 Visit                     Lenoard SQUIBB, PT Acute Rehabilitation Services Office: [phone]    Enedelia Martorelli B  Jaimy Kliethermes 04/04/2024, 10:20 AM

## 2024-04-04 NOTE — Progress Notes (Signed)
 PROGRESS NOTE                                                                                                                                                                                                             Patient Demographics:    Fred Mcintosh, is a 63 y.o. male, DOB - [birth year], FMW:[id number]  Outpatient Primary MD for the patient is Fred Lung, MD    LOS - 8  Admit date - 03/27/2024    Chief Complaint  Patient presents with   Altered Mental Status       Brief Narrative (HPI from H&P)    63 year old male with past medical history of diabetes, hypertension, asthma, hyperthyroidism/Graves', recent left foot amputation who presented to the emergency department on 9/10 AM with complaint of altered mental status. Per EDP, family called 911 after patient reportedly laying in bed for 9 days. With EMS, blood glucose out of range, noted coffee-ground emesis and dark tarry stools. On arrival to ED, tachycardic to 130s, lethargic. Left foot noted to be dehisced with exposed bone, foul drainage and subcutaneous emphysema up the shin. Labs notable for lactic acid 5.4, Na 128, K 5.9, Cl 89, bicarb 16, BG 816, BUN 101, sCr 2.57, alk phos 127, Tbili 3.3, AG 23, WBC 31.4, Hgb 9, plt 440, INR 1.5, BHB 3.71, UA with trace leukocytes. Blood cultures were drawn and pending. CXR with no acute abnormality. Left foot xray with air around the stump and lower half of leg. Obvious concern for necrotizing fasciitis with septic shock, HHS.   He was given 3L LR fluid resuscitation, started on Zyvox  and Zosyn , insulin  drip. Requiring low dose levophed  despite fluid resuscitation. Orthopedic surgery consulted. Dr. Harden to take him to OR later today. 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 Harden, seen by ID, PCCM and orthopedics stabilized and transferred to hospitalist  service under my care on 04/01/2024.     Significant Hospital Events: Including procedures, antibiotic start and stop dates in addition to other pertinent events           9/10: ED for septic shock vs left foot wound and nec fasc, HHS. Ortho to take to OR, CCM to admit ICU  9/11 POD#1 s/p L AKA, wound vac in place. Extubated  03/31/2024.  Echocardiogram with bubble study.  1. Left ventricular ejection fraction, by estimation, is 70 to 75%. The left ventricle has hyperdynamic function. Left ventricular endocardial border not optimally defined to evaluate regional wall motion. There is moderate concentric left ventricular hypertrophy. Left ventricular diastolic parameters are consistent with Grade I diastolic dysfunction (impaired relaxation).  2. Right ventricular systolic function was not well visualized. The right ventricular size is normal.  3. The mitral valve is normal in structure. No evidence of mitral valve regurgitation. No evidence of mitral stenosis.  4. There is a bright mobile target on the ventricular side of the TV leaflets that is worrisome for vegetation. The tricuspid valve is abnormal.  5. The aortic valve is tricuspid. Aortic valve regurgitation is not visualized. Aortic valve sclerosis/calcification is present, without any evidence of aortic stenosis. Aortic valve area, by VTI measures 3.16 cm. Aortic valve mean gradient measures 5.0 mmHg. Aortic valve Vmax measures 1.34 m/s.  6. The inferior vena cava is normal in size with greater than 50% respiratory variability, suggesting right atrial pressure of 3 mmHg.     Subjective:   In bed, comfortable, denies any complaints, wound VAC got dislodged overnight.   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 Harden, has left AKA with a wound VAC, seen by ID as well, biotics management per  ID.  - Echocardiogram shows questionable growth on tricuspid valve, but TEE negative for endocarditis -Cussed with ID, recommendation for 2 weeks antibiotic from negative blood cultures, estimated end of treatment 9/25, to remain on IV penicillin  during hospital stay, and can transition to cefadroxil 1 g twice daily upon discharge   Acute Metabolic encephalopathy - due to #1 above, no headache or focal deficits, treat sepsis. - CT head with no acute findings - Improving, but not back to baseline  Dehydration, hypernatremia acute kidney injury - due to sepsis hydrate and monitor.  - Passed bedside swallow evaluation, encouraged to increase his fluid intake. - Sodium trending down, will start on half NS.  Ongoing dysphagia.   - Passed swallow evaluation yesterday, but remains with poor oral intake, continue supplements, encouraged to increase fluid intake.    Elevated Tbili- f/u LFTs this am  Hypothyroidism- continue PTA synthroid , needs OP f/u  History of hypertension no acute issues continue to monitor  History of asthma no wheezing, supportive care.  Hx of infrequent medical care- complicating control of above  Physical deconditioning.  PT OT, may require placement   DM2 with hyperglycemia -Now he is taking oral he is started on Lantus , CBG remains on the higher side so we will increase Lantus  to 15 units daily   Lab Results  Component Value Date   HGBA1C 6.7 (H) 03/27/2024    CBG (last 3)  Recent Labs    04/04/24 0733 04/04/24 0945 04/04/24 1212  GLUCAP 210* 232* 256*         Condition - Extremely Guarded  Family Communication  : None present  Code Status : Full code  Consults  : PCCM, orthopedics, ID  PUD Prophylaxis :     Procedures  :            Disposition Plan  :    Status is: Inpatient   DVT Prophylaxis  :    SCD's Start: 03/27/24 1747 heparin  injection 5,000  Units Start: 03/27/24 1400    Lab Results  Component Value Date   PLT  185 04/04/2024    Diet :  Diet Order             DIET DYS 2 Room service appropriate? No; Fluid consistency: Thin  Diet effective now                    Inpatient Medications  Scheduled Meds:  sodium chloride    Intravenous Once   acetaminophen   650 mg Oral Q6H WA   vitamin C   1,000 mg Oral Daily   Chlorhexidine  Gluconate Cloth  6 each Topical Daily   docusate sodium   100 mg Oral BID   feeding supplement  237 mL Oral BID BM   heparin   5,000 Units Subcutaneous Q8H   insulin  aspart  0-15 Units Subcutaneous TID WC   insulin  aspart  0-5 Units Subcutaneous QHS   insulin  glargine  10 Units Subcutaneous Q24H   leptospermum manuka honey  1 Application Topical Daily   levothyroxine   50 mcg Oral Q0600   nutrition supplement (JUVEN)  1 packet Oral BID AC & HS   pantoprazole  (PROTONIX ) IV  40 mg Intravenous Q24H   polyethylene glycol  17 g Oral Daily   zinc  sulfate (50mg  elemental zinc )  220 mg Oral Daily   Continuous Infusions:  penicillin  G potassium 24 Million Units in dextrose  5 % 500 mL CONTINUOUS infusion 24 Million Units (04/04/24 1218)   PRN Meds:.albuterol , dextrose , fentaNYL  (SUBLIMAZE ) injection, hydrALAZINE , labetalol , methocarbamol  (ROBAXIN ) injection, ondansetron  (ZOFRAN ) IV, mouth rinse, oxyCODONE     Objective:   Vitals:   04/04/24 0329 04/04/24 0416 04/04/24 0719 04/04/24 1107  BP:   127/78 128/60  Pulse:   89 97  Resp:   18 18  Temp: 98.2 F (36.8 C)  98 F (36.7 C)   TempSrc: Oral  Oral   SpO2:   98% 96%  Weight:  116.3 kg    Height:        Wt Readings from Last 3 Encounters:  04/04/24 116.3 kg  03/08/24 (!) 141.5 kg  01/18/24 (!) 153.3 kg     Intake/Output Summary (Last 24 hours) at 04/04/2024 1355 Last data filed at 04/04/2024 1050 Gross per 24 hour  Intake 1642.25 ml  Output 1850 ml  Net -207.75 ml     Physical Exam  Awake Alert, x 2, slow to respond, confused but he is more appropriate and coherent today Symmetrical Chest wall  movement, Good air movement bilaterally, CTAB RRR,No Gallops,Rubs or new Murmurs, No Parasternal Heave +ve B.Sounds, Abd Soft, No tenderness, No rebound - guarding or rigidity. No Cyanosis, left AKA.      Data Review:    Recent Labs  Lab 03/31/24 0838 04/01/24 0427 04/02/24 0419 04/03/24 0357 04/04/24 0256  WBC 12.9* 10.6* 11.4* 10.4 10.0  HGB 9.3* 8.7* 8.6* 9.0* 9.6*  HCT 29.0* 27.9* 28.5* 29.8* 32.2*  PLT 152 138* 142* 151 185  MCV 87.3 91.5 94.4 95.2 95.3  MCH 28.0 28.5 28.5 28.8 28.4  MCHC 32.1 31.2 30.2 30.2 29.8*  RDW 17.2* 17.2* 17.5* 18.1* 18.4*  LYMPHSABS  --   --  1.6 1.2 1.2  MONOABS  --   --  0.5 0.4 0.4  EOSABS  --   --  0.1 0.1 0.1  BASOSABS  --   --  0.0 0.0 0.0    Recent Labs  Lab 03/29/24 1802 03/29/24 2320 03/31/24 0838 04/01/24 0427 04/01/24 0630 04/01/24  1108 04/02/24 0419 04/03/24 0357 04/04/24 0256  NA  --    < > 147*  --  151*  --  151* 150* 149*  K  --    < > 4.7  --  5.0  --  4.8 5.1 4.6  CL  --    < > 111  --  111  --  116* 118* 113*  CO2  --    < > 26  --  27  --  26 27 25   ANIONGAP  --    < > 10  --  13  --  9 5 11   GLUCOSE  --    < > 187*  --  184*  --  123* 205* 213*  BUN  --    < > 91*  --  91*  --  72* 61* 51*  CREATININE  --    < > 1.59*  --  1.62*  --  1.47* 1.47* 1.49*  AST  --    < > 19  --  25  --  18 17 18   ALT  --    < > 8  --  9  --  11 12 14   ALKPHOS  --    < > 75  --  71  --  67 66 81  BILITOT  --    < > 1.2  --  0.9  --  0.8 0.7 1.1  ALBUMIN  --    < > 1.5*  --  1.6*  --  1.6* 1.6* 1.9*  CRP  --   --   --   --   --  2.9* 1.7* 2.1* 3.9*  PROCALCITON  --   --   --  13.19  --   --  7.59 5.00 3.29  AMMONIA 21  --   --   --   --   --   --   --   --   MG  --    < > 3.1* 3.1*  --   --  2.8* 2.7* 2.7*  PHOS  --    < > 2.8 3.5  --   --  4.0 4.0 3.6  CALCIUM   --    < > 8.4*  --  8.7*  --  8.5* 8.5* 8.8*   < > = values in this interval not displayed.      Recent Labs  Lab 03/29/24 1802 03/29/24 2320 03/31/24 0838  04/01/24 0427 04/01/24 0630 04/01/24 1108 04/02/24 0419 04/03/24 0357 04/04/24 0256  CRP  --   --   --   --   --  2.9* 1.7* 2.1* 3.9*  PROCALCITON  --   --   --  13.19  --   --  7.59 5.00 3.29  AMMONIA 21  --   --   --   --   --   --   --   --   MG  --    < > 3.1* 3.1*  --   --  2.8* 2.7* 2.7*  CALCIUM   --    < > 8.4*  --  8.7*  --  8.5* 8.5* 8.8*   < > = values in this interval not displayed.    --------------------------------------------------------------------------------------------------------------- Lab Results  Component Value Date   CHOL 166 01/18/2024   HDL 15 (L) 01/18/2024   LDLCALC 127 (H) 01/18/2024   TRIG 169 (H) 03/28/2024   CHOLHDL 11.1 01/18/2024    Lab Results  Component  Value Date   HGBA1C 6.7 (H) 03/27/2024      Micro Results Recent Results (from the past 240 hours)  Blood Culture (routine x 2)     Status: None   Collection Time: 03/27/24  9:09 AM   Specimen: BLOOD  Result Value Ref Range Status   Specimen Description BLOOD BLOOD LEFT HAND  Final   Special Requests   Final    Blood Culture adequate volume BOTTLES DRAWN AEROBIC AND ANAEROBIC   Culture   Final    NO GROWTH 5 DAYS Performed at [facility], [address] [place], [place] [address]    Report Status 04/01/2024 FINAL  Final  Blood Culture (routine x 2)     Status: Abnormal   Collection Time: 03/27/24  9:10 AM   Specimen: BLOOD  Result Value Ref Range Status   Specimen Description   Final    BLOOD BLOOD RIGHT HAND Performed at [facility], [address], [place], [place] [address]    Special Requests   Final    Blood Culture results may not be optimal due to an inadequate volume of blood received in culture bottles BOTTLES DRAWN AEROBIC AND ANAEROBIC Performed at [facility], [address] [place], [place] [address]    Culture  Setup Time   Final    AEROBIC BOTTLE ONLY GRAM POSITIVE COCCI IN CHAINS Gram Stain Report Called to,Read Back By and Verified With: E GANT AT 0952  ON 09.11.25 BY ADGER J  CRITICAL RESULT CALLED TO, READ BACK BY AND VERIFIED WITH: PHARMD BLAKE WANNARAT ON 03/28/24 @ 1506 BY DRT Performed at [facility], [address], [place], [place] [address]    Culture STREPTOCOCCUS ANGINOSIS (A)  Final   Report Status 03/31/2024 FINAL  Final   Organism ID, Bacteria STREPTOCOCCUS ANGINOSIS  Final      Susceptibility   Streptococcus anginosis - MIC*    PENICILLIN  <=0.06 SENSITIVE Sensitive     CEFTRIAXONE  <=0.12 SENSITIVE Sensitive     ERYTHROMYCIN <=0.12 SENSITIVE Sensitive     LEVOFLOXACIN  <=0.25 SENSITIVE Sensitive     VANCOMYCIN  0.5 SENSITIVE Sensitive     * STREPTOCOCCUS ANGINOSIS  Blood Culture ID Panel (Reflexed)     Status: Abnormal   Collection Time: 03/27/24  9:10 AM  Result Value Ref Range Status   Enterococcus faecalis NOT DETECTED NOT DETECTED Final   Enterococcus Faecium NOT DETECTED NOT DETECTED Final   Listeria monocytogenes NOT DETECTED NOT DETECTED Final   Staphylococcus species NOT DETECTED NOT DETECTED Final   Staphylococcus aureus (BCID) NOT DETECTED NOT DETECTED Final   Staphylococcus epidermidis NOT DETECTED NOT DETECTED Final   Staphylococcus lugdunensis NOT DETECTED NOT DETECTED Final   Streptococcus species DETECTED (A) NOT DETECTED Final    Comment: Not Enterococcus species, Streptococcus agalactiae, Streptococcus pyogenes, or Streptococcus pneumoniae. CRITICAL RESULT CALLED TO, READ BACK BY AND VERIFIED WITH: PHARMD BLAKE WANNARAT ON 03/28/24 @ 1506 BY DRT    Streptococcus agalactiae NOT DETECTED NOT DETECTED Final   Streptococcus pneumoniae NOT DETECTED NOT DETECTED Final   Streptococcus pyogenes NOT DETECTED NOT DETECTED Final   A.calcoaceticus-baumannii NOT DETECTED NOT DETECTED Final   Bacteroides fragilis NOT DETECTED NOT DETECTED Final   Enterobacterales NOT DETECTED NOT DETECTED Final   Enterobacter cloacae complex NOT DETECTED NOT DETECTED Final   Escherichia coli NOT DETECTED NOT DETECTED Final    Klebsiella aerogenes NOT DETECTED NOT DETECTED Final   Klebsiella oxytoca NOT DETECTED NOT DETECTED Final   Klebsiella pneumoniae NOT  DETECTED NOT DETECTED Final   Proteus species NOT DETECTED NOT DETECTED Final   Salmonella species NOT DETECTED NOT DETECTED Final   Serratia marcescens NOT DETECTED NOT DETECTED Final   Haemophilus influenzae NOT DETECTED NOT DETECTED Final   Neisseria meningitidis NOT DETECTED NOT DETECTED Final   Pseudomonas aeruginosa NOT DETECTED NOT DETECTED Final   Stenotrophomonas maltophilia NOT DETECTED NOT DETECTED Final   Candida albicans NOT DETECTED NOT DETECTED Final   Candida auris NOT DETECTED NOT DETECTED Final   Candida glabrata NOT DETECTED NOT DETECTED Final   Candida krusei NOT DETECTED NOT DETECTED Final   Candida parapsilosis NOT DETECTED NOT DETECTED Final   Candida tropicalis NOT DETECTED NOT DETECTED Final   Cryptococcus neoformans/gattii NOT DETECTED NOT DETECTED Final    Comment: Performed at [facility], [address], [place], [place] [address]  MRSA Next Gen by PCR, Nasal     Status: None   Collection Time: 03/27/24 12:02 PM   Specimen: Nasal Mucosa; Nasal Swab  Result Value Ref Range Status   MRSA by PCR Next Gen NOT DETECTED NOT DETECTED Final    Comment: (NOTE) The GeneXpert MRSA Assay (FDA approved for NASAL specimens only), is one component of a comprehensive MRSA colonization surveillance program. It is not intended to diagnose MRSA infection nor to guide or monitor treatment for MRSA infections. Test performance is not FDA approved in patients less than 67 years old. Performed at [facility], [address] [place], [place] [address]   Culture, blood (Routine X 2) w Reflex to ID Panel     Status: None   Collection Time: 03/29/24 10:52 AM   Specimen: BLOOD LEFT HAND  Result Value Ref Range Status   Specimen Description BLOOD LEFT HAND  Final   Special Requests   Final    BOTTLES DRAWN AEROBIC AND ANAEROBIC  Blood Culture results may not be optimal due to an inadequate volume of blood received in culture bottles   Culture   Final    NO GROWTH 5 DAYS Performed at [facility], [address], [place], [place] [address]    Report Status 04/03/2024 FINAL  Final  Culture, blood (Routine X 2) w Reflex to ID Panel     Status: None   Collection Time: 03/29/24 10:52 AM   Specimen: BLOOD LEFT HAND  Result Value Ref Range Status   Specimen Description BLOOD LEFT HAND  Final   Special Requests   Final    BOTTLES DRAWN AEROBIC AND ANAEROBIC Blood Culture results may not be optimal due to an inadequate volume of blood received in culture bottles   Culture   Final    NO GROWTH 5 DAYS Performed at [facility], [address], [place], [place] [address]    Report Status 04/03/2024 FINAL  Final    Radiology Report CT HEAD WO CONTRAST ( ) Result Date: 04/04/2024 CLINICAL DATA:  63 year old male with altered mental status. Recent shortness of breath. EXAM: CT HEAD WITHOUT CONTRAST TECHNIQUE: Contiguous axial images were obtained from the base of the skull through the vertex without intravenous contrast. RADIATION DOSE REDUCTION: This exam was performed according to the departmental dose-optimization program which includes automated exposure control, adjustment of the mA and/or kV according to patient size and/or use of iterative reconstruction technique. COMPARISON:  None Available. FINDINGS: Brain: No midline shift, ventriculomegaly, mass effect, evidence of mass lesion, intracranial hemorrhage or evidence of cortically based acute infarction. Gray-white differentiation is within  normal limits for age. Vascular: No suspicious intracranial vascular hyperdensity. Mild Calcified atherosclerosis at the skull base. Skull: Intact. No acute osseous abnormality identified. Sinuses/Orbits: Visualized paranasal sinuses and mastoids are clear. Other: Visualized orbits and scalp soft tissues are within normal  limits. IMPRESSION: No acute intracranial abnormality. Normal for age noncontrast CT appearance of the brain. Electronically Signed   By: VEAR Hurst M.D.   On: 04/04/2024 05:12   DG Swallowing Func-Speech Pathology Result Date: 04/03/2024 Table formatting from the original result was not included. Modified Barium Swallow Study Patient Details Name: Fred Mcintosh MRN: [id number] Date of Birth: 24-Feb-1961 Today's Date: 04/03/2024 HPI/PMH: HPI: 63 yo male admitted 9/10 with AMS and weakness after not getting OOB for 9 days. 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 ETT 9/10-9/11. PMH includes: recent L foot amputation, DM, HTN, asthma, Graves disease Clinical Impression: Clinical Impression: Pt exhibits mild oropharyngeal dysphagia primarily affected by his impulsivity. There is adequate laryngeal closure but the swallow is initiated at the pyriform sinuses, contributing to trace penetration (PAS 3). A cued cough helps clear these penetrates. There is no significant residue but his inattention contributes to prolonged mastication and suspected timing deficits. Recommend starting with Dys 2 solids and thin liquids in an attempt to control rate and ensure adequate mastication. Discussed with MD and RN. Will continue following to assess readiness to advance diet given fluctuating mentation. Factors that may increase risk of adverse event in presence of aspiration Noe & Lianne 2021): Factors that may increase risk of adverse event in presence of aspiration Noe & Lianne 2021): Reduced cognitive function; Limited mobility Recommendations/Plan: Swallowing Evaluation Recommendations Swallowing Evaluation Recommendations Recommendations: PO diet PO Diet Recommendation: Dysphagia 2 (Finely chopped); Thin liquids (Level 0) Liquid Administration via: Cup; Straw Medication Administration: Whole meds with puree Supervision: Patient able to self-feed; Full supervision/cueing for swallowing  strategies Swallowing strategies  : Minimize environmental distractions; Slow rate; Small bites/sips Postural changes: Position pt fully upright for meals; Stay upright 30-60 min after meals Oral care recommendations: Oral care BID (2x/day) Treatment Plan Treatment Plan Treatment recommendations: Therapy as outlined in treatment plan below Follow-up recommendations: Skilled nursing-short term rehab (<3 hours/day) Functional status assessment: Patient has had a recent decline in their functional status and demonstrates the ability to make significant improvements in function in a reasonable and predictable amount of time. Treatment frequency: Min 2x/week Treatment duration: 2 weeks Interventions: Aspiration precaution training; Patient/family education; Trials of upgraded texture/liquids; Diet toleration management by SLP Recommendations Recommendations for follow up therapy are one component of a multi-disciplinary discharge planning process, led by the attending physician.  Recommendations may be updated based on patient status, additional functional criteria and insurance authorization. Assessment: Orofacial Exam: Orofacial Exam Oral Cavity: Oral Hygiene: WFL Oral Cavity - Dentition: Adequate natural dentition Orofacial Anatomy: WFL Oral Motor/Sensory Function: WFL Anatomy: Anatomy: Suspected cervical osteophytes Boluses Administered: Boluses Administered Boluses Administered: Thin liquids (Level 0); Mildly thick liquids (Level 2, nectar thick); Moderately thick liquids (Level 3, honey thick); Puree; Solid  Oral Impairment Domain: Oral Impairment Domain Lip Closure: No labial escape Tongue control during bolus hold: Posterior escape of less than half of bolus Bolus preparation/mastication: Timely and efficient chewing and mashing Bolus transport/lingual motion: Brisk tongue motion Oral residue: Complete oral clearance Location of oral residue : N/A Initiation of pharyngeal swallow : Pyriform sinuses  Pharyngeal  Impairment Domain: Pharyngeal Impairment Domain Soft palate elevation: No bolus between soft palate (SP)/pharyngeal wall (PW) Laryngeal elevation: Complete  superior movement of thyroid  cartilage with complete approximation of arytenoids to epiglottic petiole Anterior hyoid excursion: Complete anterior movement Epiglottic movement: Complete inversion Laryngeal vestibule closure: Complete, no air/contrast in laryngeal vestibule Pharyngeal stripping wave : Present - complete Pharyngeal contraction (A/P view only): N/A Pharyngoesophageal segment opening: -- (unable to visualize) Tongue base retraction: No contrast between tongue base and posterior pharyngeal wall (PPW) Pharyngeal residue: Trace residue within or on pharyngeal structures Location of pharyngeal residue: Valleculae  Esophageal Impairment Domain: No data recorded Pill: No data recorded Penetration/Aspiration Scale Score: Penetration/Aspiration Scale Score 1.  Material does not enter airway: Mildly thick liquids (Level 2, nectar thick); Moderately thick liquids (Level 3, honey thick); Puree; Solid 3.  Material enters airway, remains ABOVE vocal cords and not ejected out: Thin liquids (Level 0) Compensatory Strategies: Compensatory Strategies Compensatory strategies: No   General Information: Caregiver present: No  Diet Prior to this Study: NPO   Temperature : Normal   Respiratory Status: WFL   Supplemental O2: None (Room air)   History of Recent Intubation: Yes  Behavior/Cognition: Alert; Cooperative Self-Feeding Abilities: Able to self-feed Baseline vocal quality/speech: Normal Volitional Cough: Able to elicit Volitional Swallow: Able to elicit Exam Limitations: Limited visibility Goal Planning: Prognosis for improved oropharyngeal function: Good Barriers to Reach Goals: Cognitive deficits No data recorded Patient/Family Stated Goal: asking for food Consulted and agree with results and recommendations: Patient; Physician; Nurse; Dietitian Pain: Pain  Assessment Pain Assessment: No/denies pain Faces Pain Scale: 0 Breathing: 0 Negative Vocalization: 0 Facial Expression: 0 Body Language: 0 Consolability: 0 PAINAD Score: 0 Pain Location: pain response with movement of LLE Pain Descriptors / Indicators: Grimacing; Guarding Pain Intervention(s): Monitored during session End of Session: Start Time:SLP Start Time (ACUTE ONLY): 1040 Stop Time: SLP Stop Time (ACUTE ONLY): 1100 Time Calculation:SLP Time Calculation (min) (ACUTE ONLY): 20 min Charges: SLP Evaluations $ SLP Speech Visit: 1 Visit SLP Evaluations $MBS Swallow: 1 Procedure $Swallowing Treatment: 1 Procedure SLP visit diagnosis: SLP Visit Diagnosis: Dysphagia, oropharyngeal phase (R13.12) Past Medical History: Past Medical History: Diagnosis Date  Acute respiratory failure with hypoxia (HCC) 07/13/2012  Secondary to multi-lobar bilateral pneumonia  Anemia   Asthma   Community acquired pneumonia 07/13/2012  Bilateral/multi-lobar  Diabetes mellitus without complication (HCC)   type 2  Graves disease   Hypertension  Past Surgical History: Past Surgical History: Procedure Laterality Date  AMPUTATION Left 03/08/2024  Procedure: LEFT TRANSMETATARSAL AMPUTATION;  Surgeon: Harden Jerona GAILS, MD;  Location: [facility] OR;  Service: Orthopedics;  Laterality: Left;  AMPUTATION Left 03/27/2024  Procedure: AMPUTATION, ABOVE KNEE LEFT;  Surgeon: Harden Jerona GAILS, MD;  Location: [facility] OR;  Service: Orthopedics;  Laterality: Left;  AMPUTATION TOE Left 01/20/2024  Procedure: AMPUTATION, TOE;  Surgeon: Harden Jerona GAILS, MD;  Location: [facility] OR;  Service: Orthopedics;  Laterality: Left;  LEFT FOOT 2ND RAY  TRANSESOPHAGEAL ECHOCARDIOGRAM (CATH LAB) N/A 04/02/2024  Procedure: TRANSESOPHAGEAL ECHOCARDIOGRAM;  Surgeon: Shlomo Wilbert SAUNDERS, MD;  Location: [facility] INVASIVE CV LAB;  Service: Cardiovascular;  Laterality: N/A; Damien Blumenthal, M.A., CCC-SLP Speech Language Pathology, Acute Rehabilitation Services Secure Chat preferred [phone] 04/03/2024, 11:48 AM     Signature  -   Brayton Lye M.D on 04/04/2024 at 1:55 PM   -  To page go to [email or web address]

## 2024-04-04 NOTE — Progress Notes (Signed)
 Calorie Count Note  48-hour calorie count ordered. Day one now completed. Results for review below.   Estimated Nutritional Needs:  Kcal:  2400-2600 kcal/d Protein:  120-145 g/d Fluid:  2.5L/d  Patient not sufficiently or consistently meeting needs. PO intake varying greatly. Patient requiring adjustments to Lantus  regimen because of this. PT stating cognition slowly improving. Poor PO intake and poorly controlled blood sugars will impact wound healing significantly.   Albumin remains low 2/2 level of inflammation as well as inadequate PO intake. Encouraged patient to consume more today.   Diet: Dysphagia 2/ thin liquids Supplements: Ensure Plus High Protein x2, Magic Cup x3  Day 1 (9/17-9/18) Lunch: 100% baked chicken w/ gravy, broccoli, and orange sherbet (400 kcals, 23g protein) Dinner: 25% roast turkey w/ gravy, mashed potatoes, and green beans, 100% mixed berries, 100% iced tea (149 kcals, 5g protein) Breakfast: 15% pineapple, oatmeal, scrambled eggs, sausage, coffee, 100% OJ and milk  (204 kcals, 10g protein) Supplements: no documented intake of supplements  Day 1 Total intake: 956 kcal (40% of minimum estimated needs)  38g protein (32% of minimum estimated needs)  INTERVENTION:  Hold TF and Cortrak placement to assess oral intake  Continue calorie count to assess need for nutrition support  Continue Juven BID before breakfast and at bedtime, each packet provides 95 calories, 2.5 grams of protein (collagen), and 9.8 grams of carbohydrate (3 grams sugar); also contains 7 grams of L-arginine and L-glutamine, 300 mg vitamin C , 15 mg vitamin E, 1.2 mcg vitamin B-12, 9.5 mg zinc , 200 mg calcium , and 1.5 g  Calcium  Beta-hydroxy-Beta-methylbutyrate to support wound healing  Per MD, vitamin C  1000 mg x 30 days and 220mg  of zinc    Monitor SLP notes for diet advancement and tolerance  Continue Ensure Plus High Protein po BID, each supplement provides 350 kcal and 20 grams of protein    Continue Magic cup TID with meals, each supplement provides 290 kcal and 9 grams of protein    NUTRITION DIAGNOSIS:  Increased nutrient needs related to wound healing as evidenced by estimated needs. - remains applicable   GOAL:  Patient will meet greater than or equal to 90% of their needs - progressing   MONITOR:  Diet advancement, I & O's, Labs, Skin  Blair Deaner MS, RD, LDN Registered Dietitian Clinical Nutrition RD Inpatient Contact Info in Amion

## 2024-04-04 NOTE — Progress Notes (Incomplete)
 Plan on 2 weeks of antibiotics from negative blood culturesEOT 9/25. Penicillin  for now can transition to cefadroxil 1 g twice daily when closer to discharge. CT head ok

## 2024-04-05 DIAGNOSIS — M726 Necrotizing fasciitis: Secondary | ICD-10-CM | POA: Diagnosis not present

## 2024-04-05 LAB — COMPREHENSIVE METABOLIC PANEL WITH GFR
ALT: 12 U/L (ref 0–44)
AST: 16 U/L (ref 15–41)
Albumin: 1.7 g/dL — ABNORMAL LOW (ref 3.5–5.0)
Alkaline Phosphatase: 71 U/L (ref 38–126)
Anion gap: 13 (ref 5–15)
BUN: 46 mg/dL — ABNORMAL HIGH (ref 8–23)
CO2: 23 mmol/L (ref 22–32)
Calcium: 8.5 mg/dL — ABNORMAL LOW (ref 8.9–10.3)
Chloride: 112 mmol/L — ABNORMAL HIGH (ref 98–111)
Creatinine, Ser: 1.23 mg/dL (ref 0.61–1.24)
GFR, Estimated: >60 mL/min (ref >60)
Glucose, Bld: 205 mg/dL — ABNORMAL HIGH (ref 70–99)
Potassium: 4.8 mmol/L (ref 3.5–5.1)
Sodium: 148 mmol/L — ABNORMAL HIGH (ref 135–145)
Total Bilirubin: 0.8 mg/dL (ref 0.0–1.2)
Total Protein: 6.3 g/dL — ABNORMAL LOW (ref 6.5–8.1)

## 2024-04-05 LAB — CBC WITH DIFFERENTIAL/PLATELET
Abs Immature Granulocytes: 0.06 K/uL (ref 0.00–0.07)
Basophils Absolute: 0 K/uL (ref 0.0–0.1)
Basophils Relative: 0 %
Eosinophils Absolute: 0.1 K/uL (ref 0.0–0.5)
Eosinophils Relative: 1 %
HCT: 26.9 % — ABNORMAL LOW (ref 39.0–52.0)
Hemoglobin: 8.2 g/dL — ABNORMAL LOW (ref 13.0–17.0)
Immature Granulocytes: 1 %
Lymphocytes Relative: 16 %
Lymphs Abs: 1.6 K/uL (ref 0.7–4.0)
MCH: 28.4 pg (ref 26.0–34.0)
MCHC: 30.5 g/dL (ref 30.0–36.0)
MCV: 93.1 fL (ref 80.0–100.0)
Monocytes Absolute: 0.4 K/uL (ref 0.1–1.0)
Monocytes Relative: 4 %
Neutro Abs: 7.7 K/uL (ref 1.7–7.7)
Neutrophils Relative %: 78 %
Platelets: 187 K/uL (ref 150–400)
RBC: 2.89 MIL/uL — ABNORMAL LOW (ref 4.22–5.81)
RDW: 18.1 % — ABNORMAL HIGH (ref 11.5–15.5)
WBC: 10 K/uL (ref 4.0–10.5)
nRBC: 0 % (ref 0.0–0.2)

## 2024-04-05 LAB — C-REACTIVE PROTEIN: CRP: 4.8 mg/dL — ABNORMAL HIGH (ref <1.0)

## 2024-04-05 LAB — PROCALCITONIN: Procalcitonin: 2.06 ng/mL

## 2024-04-05 LAB — PHOSPHORUS: Phosphorus: 3.3 mg/dL (ref 2.5–4.6)

## 2024-04-05 LAB — GLUCOSE, CAPILLARY
Glucose-Capillary: 156 mg/dL — ABNORMAL HIGH (ref 70–99)
Glucose-Capillary: 149 mg/dL — ABNORMAL HIGH (ref 70–99)

## 2024-04-05 LAB — MAGNESIUM: Magnesium: 2.3 mg/dL (ref 1.7–2.4)

## 2024-04-05 MED ORDER — MIRTAZAPINE 15 MG PO TABS
15.0000 mg | ORAL_TABLET | Freq: Every day | ORAL | Status: DC
  Administered 2024-04-05: 15 mg via ORAL
  Filled 2024-04-05 (×2): qty 1

## 2024-04-05 MED ORDER — ENSURE PLUS HIGH PROTEIN PO LIQD
237.0000 mL | Freq: Four times a day (QID) | ORAL | Status: DC
  Administered 2024-04-05 – 2024-04-07 (×6): 237 mL via ORAL

## 2024-04-05 MED ORDER — PROSOURCE PLUS PO LIQD
30.0000 mL | Freq: Two times a day (BID) | ORAL | Status: DC
  Administered 2024-04-05 – 2024-04-07 (×4): 30 mL via ORAL
  Filled 2024-04-05 (×6): qty 30

## 2024-04-05 MED ORDER — INSULIN ASPART 100 UNIT/ML IJ SOLN
4.0000 [IU] | Freq: Three times a day (TID) | INTRAMUSCULAR | Status: DC
  Administered 2024-04-05 – 2024-04-25 (×43): 4 [IU] via SUBCUTANEOUS

## 2024-04-05 NOTE — Plan of Care (Signed)

## 2024-04-05 NOTE — Progress Notes (Signed)
 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  Found to have necrotizing fasciitis of the left leg, #Streptococcus species bacteremia secondary to left leg necrotizing fasciitis #Diabetes mellitus #Graves' disease - Noted to have swelling, tenderness for drainage from left foot.  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 - Blood cultures from admission grew 1 out of 2 sets sets streps species. - Unable to provide history. -TEE no vegetation Recommendations:  -Continue penicillin  - CT head as patient was mental status with waxing and waning, return negative for intracranial abnormalities. - Transition to cefadroxil 1 g twice daily at discharge from negative blood cultures on 9/12 EOT 9/25 - ID follow-up on 10/7 to blood cx off of abx - ID will sign off communicated plan to primary.   SUBJECTIVE: Seen in bed more alert today.  Review of Systems: Review of Systems  All other systems reviewed and are negative.    Scheduled Meds:  sodium chloride    Intravenous Once   acetaminophen   650 mg Oral Q6H WA   vitamin C   1,000 mg Oral Daily   Chlorhexidine  Gluconate Cloth  6 each Topical Daily   docusate sodium   100 mg Oral BID   feeding supplement  237 mL Oral BID BM   heparin   5,000 Units Subcutaneous Q8H   insulin  aspart  0-15 Units Subcutaneous TID WC   insulin  aspart  0-5 Units Subcutaneous QHS   insulin  aspart  4 Units Subcutaneous TID WC   insulin  glargine  15 Units Subcutaneous Q24H   leptospermum manuka honey  1  Application Topical Daily   levothyroxine   50 mcg Oral Q0600   nutrition supplement (JUVEN)  1 packet Oral BID AC & HS   pantoprazole  (PROTONIX ) IV  40 mg Intravenous Q24H   polyethylene glycol  17 g Oral Daily   zinc  sulfate (50mg  elemental zinc )  220 mg Oral Daily   Continuous Infusions:  penicillin  G potassium 24 Million Units in dextrose  5 % 500 mL CONTINUOUS infusion 24 Million Units (04/04/24 1218)   PRN Meds:.albuterol , dextrose , fentaNYL  (SUBLIMAZE ) injection, hydrALAZINE , labetalol , methocarbamol  (ROBAXIN ) injection, ondansetron  (ZOFRAN ) IV, mouth rinse, oxyCODONE  No Known Allergies  OBJECTIVE: Vitals:   04/05/24 0050 04/05/24 0500 04/05/24 0510 04/05/24 0818  BP:   (!) 143/83 132/82  Pulse:   96 94  Resp: 15 18 18 18   Temp:   98.1 F (36.7 C) 98 F (36.7 C)  TempSrc:   Oral Oral  SpO2:   95%   Weight:  121.2 kg    Height:       Body mass index is 34.31 kg/m.  Physical Exam Constitutional:      General: He is not in acute distress.    Appearance: He is normal weight. He is not toxic-appearing.  HENT:     Head: Normocephalic and atraumatic.     Right Ear: External ear normal.     Left Ear: External ear normal.     Nose: No congestion or rhinorrhea.     Mouth/Throat:  Mouth: Mucous membranes are moist.     Pharynx: Oropharynx is clear.  Eyes:     Extraocular Movements: Extraocular movements intact.     Conjunctiva/sclera: Conjunctivae normal.     Pupils: Pupils are equal, round, and reactive to light.  Cardiovascular:     Rate and Rhythm: Normal rate and regular rhythm.     Heart sounds: No murmur heard.    No friction rub. No gallop.  Pulmonary:     Effort: Pulmonary effort is normal.     Breath sounds: Normal breath sounds.  Abdominal:     General: Abdomen is flat. Bowel sounds are normal.     Palpations: Abdomen is soft.  Musculoskeletal:        General: No swelling.     Cervical back: Normal range of motion and neck supple.     Comments: aka   Skin:    General: Skin is warm and dry.       Lab Results Lab Results  Component Value Date   WBC 10.0 04/05/2024   HGB 8.2 (L) 04/05/2024   HCT 26.9 (L) 04/05/2024   MCV 93.1 04/05/2024   PLT 187 04/05/2024    Lab Results  Component Value Date   CREATININE 1.23 04/05/2024   BUN 46 (H) 04/05/2024   NA 148 (H) 04/05/2024   K 4.8 04/05/2024   CL 112 (H) 04/05/2024   CO2 23 04/05/2024    Lab Results  Component Value Date   ALT 12 04/05/2024   AST 16 04/05/2024   ALKPHOS 71 04/05/2024   BILITOT 0.8 04/05/2024        Loney Stank, MD [facility] 04/05/2024, 8:50 AM  Evaluation of this patient requires complex antimicrobial therapy evaluation and counseling + isolation needs for disease transmission risk assessment and mitigation

## 2024-04-05 NOTE — Progress Notes (Addendum)
 Calorie Count Note  48 hour calorie count ordered. Day 2 now complete. Results can be reviewed below.   Estimated Nutritional Needs:  Kcal:  2400-2600 kcal/d Protein:  120-145 g/d Fluid:  2.5L/d  Inadequate calorie and protein intake x2 days. RN reports he is refusing breakfast and Ensure this morning due to anger that RN will not call his mother. RN states mother's phone is off and/or dead and not accepting calls. Patient does not understand this.   Discussed during progression rounds.  MD amicable to placing Cortrak tube for nutrition support to encourage wound healing and prevent loss of lean body mass/further skin breakdown.   Upon attempting to place Cortrak, patient actively pulling tube out of nose as placement being attempted. He states, I am going to pull it out as soon as you put it in. I would rather die. Endorses he will consume adequate ONS to meet his needs. Of note, while in progression, diet advanced to regular diet by speech.  Will re-start calorie count and assess intake on Monday.   Educated patient regarding importance of meeting his calorie and protein needs. He verbalizes understanding.   Diet: DYS2/thin liquids Supplements: Ensure Plus High Protein x2  Day 1 (9/18-9/19) Lunch: 25%  pot roast w/ gravy, mashed potatoes, sliced carrots, strawberry shortcake, Magic Cup, iced tea (134 kcals, 8g protein) Dinner: 25% roasted chicken w/ gravy, mashed potatoes, green peas, applesauce, Magic Cup, iced tea (114 kcals, 9g protein) Breakfast: 0% consumed  Supplements: 100% Ensure Plus High Protein x2 (700 kcals, 40g protein)   Day 2 Total intake: 948 kcal (40% of minimum estimated needs)  57 g protein (48% of minimum estimated needs)   INTERVENTION:  Hold TF and Cortrak placement to assess oral intake   Continue calorie count to assess need for nutrition support   Continue Juven BID before breakfast and at bedtime, each packet provides 95 calories, 2.5 grams of protein  (collagen), and 9.8 grams of carbohydrate (3 grams sugar); also contains 7 grams of L-arginine and L-glutamine, 300 mg vitamin C , 15 mg vitamin E, 1.2 mcg vitamin B-12, 9.5 mg zinc , 200 mg calcium , and 1.5 g  Calcium  Beta-hydroxy-Beta-methylbutyrate to support wound healing   Per MD, vitamin C  1000 mg x 30 days and 220mg  of zinc       Increase Ensure Plus High Protein po TID, each supplement provides 350 kcal and 20 grams of protein    Continue Magic cup TID with meals, each supplement provides 290 kcal and 9 grams of protein   Add 30 ml ProSource Plus BID, each supplement provides 100 kcals and 15 grams protein.     NUTRITION DIAGNOSIS:  Increased nutrient needs related to wound healing as evidenced by estimated needs. - remains applicable   GOAL:  Patient will meet greater than or equal to 90% of their needs - progressing   MONITOR:  Diet advancement, I & O's, Labs, Skin   Blair Deaner MS, RD, LDN Registered Dietitian Clinical Nutrition RD Inpatient Contact Info in Amion

## 2024-04-05 NOTE — Progress Notes (Signed)
 Speech Language Pathology Treatment: Dysphagia  Patient Details Name: Fred Mcintosh MRN: [id number] DOB: Nov 01, 1960 Today's Date: 04/05/2024 Time: 8941-8890 SLP Time Calculation (min) (ACUTE ONLY): 11 min  Assessment / Plan / Recommendation Clinical Impression  Pt making improvements related to orientation and awareness but does not appear to be at his baseline. He fed himself independently, using a slow rate with Min cueing. When he takes large uncontrolled sips, coughing follows but this was observed to be effective in expelling penetrates on MBS 9/17. Mastication of solids appeared more thorough today. Recommend advancing to regular solids and continuing thin liquids. SLP will f/u at least briefly given fluctuating mentation but do not anticipate post-acute dysphagia needs.   HPI HPI: 63 yo male admitted 9/10 with AMS and weakness after not getting OOB for 9 days. 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 ETT 9/10-9/11. PMH includes: recent L foot amputation, DM, HTN, asthma, Graves disease      SLP Plan  Continue with current plan of care          Recommendations  Diet recommendations: Regular;Thin liquid Liquids provided via: Cup;Straw Medication Administration: Whole meds with liquid Supervision: Staff to assist with self feeding;Intermittent supervision to cue for compensatory strategies Compensations: Slow rate;Small sips/bites Postural Changes and/or Swallow Maneuvers: Seated upright 90 degrees                  Oral care BID   Frequent or constant Supervision/Assistance Dysphagia, oropharyngeal phase (R13.12)     Continue with current plan of care     Damien Blumenthal, M.A., CCC-SLP Speech Language Pathology, Acute Rehabilitation Services  Secure Chat preferred [phone]   04/05/2024, 11:37 AM

## 2024-04-05 NOTE — TOC Progression Note (Signed)
 Transition of Care [facility]) - Progression Note    Patient Details  Name: Fred Mcintosh MRN: [id number] Date of Birth: [birth year]  [facility] CM/SW Contact  Inocente GORMAN Kindle, LCSW Phone Number: 04/05/2024, 5:26 PM  Clinical Narrative:    CSW continuing to follow.      Barriers to Discharge: ED Claims of Negligence on the Part of Facility/Family               Expected Discharge Plan and Services In-house Referral: Clinical Social Work     Living arrangements for the past 2 months: Single Family Home                                       Social Drivers of Health (SDOH) Interventions SDOH Screenings   Food Insecurity: No Food Insecurity (03/08/2024)  Housing: Low Risk  (03/08/2024)  Transportation Needs: No Transportation Needs (03/08/2024)  Utilities: Not At Risk (03/08/2024)  Social Connections: Moderately Integrated (01/18/2024)  Tobacco Use: Unknown (03/27/2024)    Readmission Risk Interventions     No data to display

## 2024-04-05 NOTE — Progress Notes (Signed)
 Cortrak Tube Team Note:  Consult received to place a Cortrak feeding tube. RD attempted Cortrak placement, pt refused and placement was terminated. MD and unit RD notified.  Please re consult if Cortrak placement is warranted.     Nestora Glatter RD, LDN Clinical Dietitian

## 2024-04-05 NOTE — Progress Notes (Signed)
 PROGRESS NOTE                                                                                                                                                                                                             Patient Demographics:    Fred Mcintosh, is a 63 y.o. male, DOB - 04/04/1961, FMW:[id number]  Outpatient Primary MD for the patient is [name], MD    LOS - 9  Admit date - 03/27/2024    Chief Complaint  Patient presents with   Altered Mental Status       Brief Narrative (HPI from H&P)    63 year old male with past medical history of diabetes, hypertension, asthma, hyperthyroidism/Graves', recent left foot amputation who presented to the emergency department on 9/10 AM with complaint of altered mental status. Per EDP, family called 911 after patient reportedly laying in bed for 9 days. With EMS, blood glucose out of range, noted coffee-ground emesis and dark tarry stools. On arrival to ED, tachycardic to 130s, lethargic. Left foot noted to be dehisced with exposed bone, foul drainage and subcutaneous emphysema up the shin. Labs notable for lactic acid 5.4, Na 128, K 5.9, Cl 89, bicarb 16, BG 816, BUN 101, sCr 2.57, alk phos 127, Tbili 3.3, AG 23, WBC 31.4, Hgb 9, plt 440, INR 1.5, BHB 3.71, UA with trace leukocytes. Blood cultures were drawn and pending. CXR with no acute abnormality. Left foot xray with air around the stump and lower half of leg. Obvious concern for necrotizing fasciitis with septic shock, HHS.   He was given 3L LR fluid resuscitation, started on Zyvox  and Zosyn , insulin  drip. Requiring low dose levophed  despite fluid resuscitation. Orthopedic surgery consulted. Dr. Harden to take him to OR later today. 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 Harden, seen by ID, PCCM and orthopedics stabilized and transferred to hospitalist  service under my care on 04/01/2024.     Significant Hospital Events: Including procedures, antibiotic start and stop dates in addition to other pertinent events           9/10: ED for septic shock vs left foot wound and nec fasc, HHS. Ortho to take to OR, CCM to admit ICU  9/11 POD#1 s/p L AKA, wound vac in place. Extubated  03/31/2024.  Echocardiogram with bubble study.  1. Left ventricular ejection fraction, by estimation, is 70 to 75%. The left ventricle has hyperdynamic function. Left ventricular endocardial border not optimally defined to evaluate regional wall motion. There is moderate concentric left ventricular hypertrophy. Left ventricular diastolic parameters are consistent with Grade I diastolic dysfunction (impaired relaxation).  2. Right ventricular systolic function was not well visualized. The right ventricular size is normal.  3. The mitral valve is normal in structure. No evidence of mitral valve regurgitation. No evidence of mitral stenosis.  4. There is a bright mobile target on the ventricular side of the TV leaflets that is worrisome for vegetation. The tricuspid valve is abnormal.  5. The aortic valve is tricuspid. Aortic valve regurgitation is not visualized. Aortic valve sclerosis/calcification is present, without any evidence of aortic stenosis. Aortic valve area, by VTI measures 3.16 cm. Aortic valve mean gradient measures 5.0 mmHg. Aortic valve Vmax measures 1.34 m/s.  6. The inferior vena cava is normal in size with greater than 50% respiratory variability, suggesting right atrial pressure of 3 mmHg.     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 Harden, has left AKA with a wound VAC, seen by ID as well, biotics management per ID.  -  Echocardiogram shows questionable growth on tricuspid valve, but TEE negative for endocarditis - Cussed with ID, recommendation for 2 weeks antibiotic from negative blood cultures, estimated end of treatment 9/25, to remain on IV penicillin  during hospital stay, and can transition to cefadroxil 1 g twice daily upon discharge   Acute Metabolic encephalopathy - due to #1 above, no headache or focal deficits, treat sepsis. - CT head with no acute findings - Improving, but not back to baseline he remains with intermittent confusion  Dehydration, hypernatremia and acute kidney injury - due to sepsis hydrate and monitor.  - Passed bedside swallow evaluation, encouraged to increase his fluid intake.  He remains with very poor appetite though. - Sodium trending down, he is on half NS - Remains with very poor appetite, nutritionist consulted, given his recent surgery, infection, and very low albumin they have discussed with him tube feeding, but he declined for now, but he reports he will crease his supplements intake and oral intake, will start on low-dose Remeron  as well to assist with intake.  Ongoing dysphagia.   - Passed swallow evaluation, advance to regular diet  Elevated Tbili- f/u LFTs this am  Hypothyroidism- continue PTA synthroid , needs OP f/u  History of hypertension no acute issues continue to monitor  History of asthma no wheezing, supportive care.  Hx of infrequent medical care- complicating control of above  Physical deconditioning.  PT OT, may require placement   DM2 with hyperglycemia -Now he is taking oral he is started on Lantus , CBG remains on the higher side so we will increase Lantus  to 15 units daily   Lab Results  Component Value Date   HGBA1C 6.7 (H) 03/27/2024    CBG (last 3)  Recent Labs    04/04/24 2224 04/05/24 0817 04/05/24 1202  GLUCAP 248* 156* 149*         Condition - Extremely Guarded  Family Communication  : None present  Code Status :  Full  code  Consults  : PCCM, orthopedics, ID  PUD Prophylaxis :     Procedures  :            Disposition Plan  :    Status is: Inpatient   DVT Prophylaxis  :    SCD's Start: 03/27/24 1747 heparin  injection 5,000 Units Start: 03/27/24 1400    Lab Results  Component Value Date   PLT 187 04/05/2024    Diet :  Diet Order             Diet regular Room service appropriate? No; Fluid consistency: Thin  Diet effective now                    Inpatient Medications  Scheduled Meds:  (feeding supplement) PROSource Plus  30 mL Oral BID BM   sodium chloride    Intravenous Once   acetaminophen   650 mg Oral Q6H WA   vitamin C   1,000 mg Oral Daily   Chlorhexidine  Gluconate Cloth  6 each Topical Daily   docusate sodium   100 mg Oral BID   feeding supplement  237 mL Oral QID   heparin   5,000 Units Subcutaneous Q8H   insulin  aspart  0-15 Units Subcutaneous TID WC   insulin  aspart  0-5 Units Subcutaneous QHS   insulin  aspart  4 Units Subcutaneous TID WC   insulin  glargine  15 Units Subcutaneous Q24H   leptospermum manuka honey  1 Application Topical Daily   levothyroxine   50 mcg Oral Q0600   nutrition supplement (JUVEN)  1 packet Oral BID AC & HS   pantoprazole  (PROTONIX ) IV  40 mg Intravenous Q24H   polyethylene glycol  17 g Oral Daily   zinc  sulfate (50mg  elemental zinc )  220 mg Oral Daily   Continuous Infusions:  penicillin  G potassium 24 Million Units in dextrose  5 % 500 mL CONTINUOUS infusion 24 Million Units (04/05/24 0935)   PRN Meds:.albuterol , dextrose , fentaNYL  (SUBLIMAZE ) injection, hydrALAZINE , labetalol , methocarbamol  (ROBAXIN ) injection, ondansetron  (ZOFRAN ) IV, mouth rinse, oxyCODONE     Objective:   Vitals:   04/05/24 0050 04/05/24 0500 04/05/24 0510 04/05/24 0818  BP:   (!) 143/83 132/82  Pulse:   96 94  Resp: 15 18 18 18   Temp:   98.1 F (36.7 C) 98 F (36.7 C)  TempSrc:   Oral Oral  SpO2:   95%   Weight:  121.2 kg    Height:        Wt  Readings from Last 3 Encounters:  04/05/24 121.2 kg  03/08/24 (!) 141.5 kg  01/18/24 (!) 153.3 kg     Intake/Output Summary (Last 24 hours) at 04/05/2024 1354 Last data filed at 04/05/2024 0500 Gross per 24 hour  Intake 657 ml  Output 900 ml  Net -243 ml     Physical Exam  Awake Alert, x 2, slow to respond, remains confused Symmetrical Chest wall movement, Good air movement bilaterally, CTAB RRR,No Gallops,Rubs or new Murmurs, No Parasternal Heave +ve B.Sounds, Abd Soft, No tenderness, No rebound - guarding or rigidity. No Cyanosis, left AKA.      Data Review:    Recent Labs  Lab 04/01/24 0427 04/02/24 0419 04/03/24 0357 04/04/24 0256 04/05/24 0259  WBC 10.6* 11.4* 10.4 10.0 10.0  HGB 8.7* 8.6* 9.0* 9.6* 8.2*  HCT 27.9* 28.5* 29.8* 32.2* 26.9*  PLT 138* 142* 151 185 187  MCV 91.5 94.4 95.2 95.3 93.1  MCH 28.5 28.5 28.8 28.4 28.4  MCHC 31.2 30.2 30.2 29.8* 30.5  RDW 17.2* 17.5* 18.1* 18.4* 18.1*  LYMPHSABS  --  1.6 1.2 1.2 1.6  MONOABS  --  0.5 0.4 0.4 0.4  EOSABS  --  0.1 0.1 0.1 0.1  BASOSABS  --  0.0 0.0 0.0 0.0    Recent Labs  Lab 03/29/24 1802 03/29/24 2320 04/01/24 0427 04/01/24 0630 04/01/24 1108 04/02/24 0419 04/03/24 0357 04/04/24 0256 04/05/24 0259  NA  --    < >  --  151*  --  151* 150* 149* 148*  K  --    < >  --  5.0  --  4.8 5.1 4.6 4.8  CL  --    < >  --  111  --  116* 118* 113* 112*  CO2  --    < >  --  27  --  26 27 25 23   ANIONGAP  --    < >  --  13  --  9 5 11 13   GLUCOSE  --    < >  --  184*  --  123* 205* 213* 205*  BUN  --    < >  --  91*  --  72* 61* 51* 46*  CREATININE  --    < >  --  1.62*  --  1.47* 1.47* 1.49* 1.23  AST  --    < >  --  25  --  18 17 18 16   ALT  --    < >  --  9  --  11 12 14 12   ALKPHOS  --    < >  --  71  --  67 66 81 71  BILITOT  --    < >  --  0.9  --  0.8 0.7 1.1 0.8  ALBUMIN  --    < >  --  1.6*  --  1.6* 1.6* 1.9* 1.7*  CRP  --   --   --   --  2.9* 1.7* 2.1* 3.9* 4.8*  PROCALCITON  --   --  13.19   --   --  7.59 5.00 3.29 2.06  AMMONIA 21  --   --   --   --   --   --   --   --   MG  --    < > 3.1*  --   --  2.8* 2.7* 2.7* 2.3  PHOS  --    < > 3.5  --   --  4.0 4.0 3.6 3.3  CALCIUM   --    < >  --  8.7*  --  8.5* 8.5* 8.8* 8.5*   < > = values in this interval not displayed.      Recent Labs  Lab 03/29/24 1802 03/29/24 2320 04/01/24 0427 04/01/24 0630 04/01/24 1108 04/02/24 0419 04/03/24 0357 04/04/24 0256 04/05/24 0259  CRP  --   --   --   --  2.9* 1.7* 2.1* 3.9* 4.8*  PROCALCITON  --   --  13.19  --   --  7.59 5.00 3.29 2.06  AMMONIA 21  --   --   --   --   --   --   --   --   MG  --    < > 3.1*  --   --  2.8* 2.7* 2.7* 2.3  CALCIUM   --    < >  --  8.7*  --  8.5* 8.5* 8.8* 8.5*   < > =  values in this interval not displayed.    --------------------------------------------------------------------------------------------------------------- Lab Results  Component Value Date   CHOL 166 01/18/2024   HDL 15 (L) 01/18/2024   LDLCALC 127 (H) 01/18/2024   TRIG 169 (H) 03/28/2024   CHOLHDL 11.1 01/18/2024    Lab Results  Component Value Date   HGBA1C 6.7 (H) 03/27/2024      Micro Results Recent Results (from the past 240 hours)  Blood Culture (routine x 2)     Status: None   Collection Time: 03/27/24  9:09 AM   Specimen: BLOOD  Result Value Ref Range Status   Specimen Description BLOOD BLOOD LEFT HAND  Final   Special Requests   Final    Blood Culture adequate volume BOTTLES DRAWN AEROBIC AND ANAEROBIC   Culture   Final    NO GROWTH 5 DAYS Performed at [facility], [address], [place], [place] [address]    Report Status 04/01/2024 FINAL  Final  Blood Culture (routine x 2)     Status: Abnormal   Collection Time: 03/27/24  9:10 AM   Specimen: BLOOD  Result Value Ref Range Status   Specimen Description   Final    BLOOD BLOOD RIGHT HAND Performed at [facility], [address] [place], [place] [address]    Special Requests   Final    Blood Culture results  may not be optimal due to an inadequate volume of blood received in culture bottles BOTTLES DRAWN AEROBIC AND ANAEROBIC Performed at [facility], [address], [place], [place] [address]    Culture  Setup Time   Final    AEROBIC BOTTLE ONLY GRAM POSITIVE COCCI IN CHAINS Gram Stain Report Called to,Read Back By and Verified With: E GANT AT 0952 ON 09.11.25 BY ADGER J  CRITICAL RESULT CALLED TO, READ BACK BY AND VERIFIED WITH: PHARMD BLAKE WANNARAT ON 03/28/24 @ 1506 BY DRT Performed at [facility], [address], [place], [place] [address]    Culture STREPTOCOCCUS ANGINOSIS (A)  Final   Report Status 03/31/2024 FINAL  Final   Organism ID, Bacteria STREPTOCOCCUS ANGINOSIS  Final      Susceptibility   Streptococcus anginosis - MIC*    PENICILLIN  <=0.06 SENSITIVE Sensitive     CEFTRIAXONE  <=0.12 SENSITIVE Sensitive     ERYTHROMYCIN <=0.12 SENSITIVE Sensitive     LEVOFLOXACIN  <=0.25 SENSITIVE Sensitive     VANCOMYCIN  0.5 SENSITIVE Sensitive     * STREPTOCOCCUS ANGINOSIS  Blood Culture ID Panel (Reflexed)     Status: Abnormal   Collection Time: 03/27/24  9:10 AM  Result Value Ref Range Status   Enterococcus faecalis NOT DETECTED NOT DETECTED Final   Enterococcus Faecium NOT DETECTED NOT DETECTED Final   Listeria monocytogenes NOT DETECTED NOT DETECTED Final   Staphylococcus species NOT DETECTED NOT DETECTED Final   Staphylococcus aureus (BCID) NOT DETECTED NOT DETECTED Final   Staphylococcus epidermidis NOT DETECTED NOT DETECTED Final   Staphylococcus lugdunensis NOT DETECTED NOT DETECTED Final   Streptococcus species DETECTED (A) NOT DETECTED Final    Comment: Not Enterococcus species, Streptococcus agalactiae, Streptococcus pyogenes, or Streptococcus pneumoniae. CRITICAL RESULT CALLED TO, READ BACK BY AND VERIFIED WITH: PHARMD BLAKE WANNARAT ON 03/28/24 @ 1506 BY DRT    Streptococcus agalactiae NOT DETECTED NOT DETECTED Final   Streptococcus pneumoniae NOT DETECTED NOT  DETECTED Final   Streptococcus pyogenes NOT DETECTED NOT DETECTED Final   A.calcoaceticus-baumannii NOT DETECTED NOT DETECTED Final   Bacteroides fragilis NOT DETECTED NOT DETECTED  Final   Enterobacterales NOT DETECTED NOT DETECTED Final   Enterobacter cloacae complex NOT DETECTED NOT DETECTED Final   Escherichia coli NOT DETECTED NOT DETECTED Final   Klebsiella aerogenes NOT DETECTED NOT DETECTED Final   Klebsiella oxytoca NOT DETECTED NOT DETECTED Final   Klebsiella pneumoniae NOT DETECTED NOT DETECTED Final   Proteus species NOT DETECTED NOT DETECTED Final   Salmonella species NOT DETECTED NOT DETECTED Final   Serratia marcescens NOT DETECTED NOT DETECTED Final   Haemophilus influenzae NOT DETECTED NOT DETECTED Final   Neisseria meningitidis NOT DETECTED NOT DETECTED Final   Pseudomonas aeruginosa NOT DETECTED NOT DETECTED Final   Stenotrophomonas maltophilia NOT DETECTED NOT DETECTED Final   Candida albicans NOT DETECTED NOT DETECTED Final   Candida auris NOT DETECTED NOT DETECTED Final   Candida glabrata NOT DETECTED NOT DETECTED Final   Candida krusei NOT DETECTED NOT DETECTED Final   Candida parapsilosis NOT DETECTED NOT DETECTED Final   Candida tropicalis NOT DETECTED NOT DETECTED Final   Cryptococcus neoformans/gattii NOT DETECTED NOT DETECTED Final    Comment: Performed at [facility], [address], [place], [place] [address]  MRSA Next Gen by PCR, Nasal     Status: None   Collection Time: 03/27/24 12:02 PM   Specimen: Nasal Mucosa; Nasal Swab  Result Value Ref Range Status   MRSA by PCR Next Gen NOT DETECTED NOT DETECTED Final    Comment: (NOTE) The GeneXpert MRSA Assay (FDA approved for NASAL specimens only), is one component of a comprehensive MRSA colonization surveillance program. It is not intended to diagnose MRSA infection nor to guide or monitor treatment for MRSA infections. Test performance is not FDA approved in patients less than 93  years old. Performed at [facility], [address] [place], [place] [address]   Culture, blood (Routine X 2) w Reflex to ID Panel     Status: None   Collection Time: 03/29/24 10:52 AM   Specimen: BLOOD LEFT HAND  Result Value Ref Range Status   Specimen Description BLOOD LEFT HAND  Final   Special Requests   Final    BOTTLES DRAWN AEROBIC AND ANAEROBIC Blood Culture results may not be optimal due to an inadequate volume of blood received in culture bottles   Culture   Final    NO GROWTH 5 DAYS Performed at [facility], [address] [place], [place] [address]    Report Status 04/03/2024 FINAL  Final  Culture, blood (Routine X 2) w Reflex to ID Panel     Status: None   Collection Time: 03/29/24 10:52 AM   Specimen: BLOOD LEFT HAND  Result Value Ref Range Status   Specimen Description BLOOD LEFT HAND  Final   Special Requests   Final    BOTTLES DRAWN AEROBIC AND ANAEROBIC Blood Culture results may not be optimal due to an inadequate volume of blood received in culture bottles   Culture   Final    NO GROWTH 5 DAYS Performed at [facility], [address] [place], [place] [address]    Report Status 04/03/2024 FINAL  Final    Radiology Report CT HEAD WO CONTRAST ( ) Result Date: 04/04/2024 CLINICAL DATA:  63 year old male with altered mental status. Recent shortness of breath. EXAM: CT HEAD WITHOUT CONTRAST TECHNIQUE: Contiguous axial images were obtained from the base of the skull through the vertex without intravenous contrast. RADIATION DOSE REDUCTION: This exam was performed according to the departmental dose-optimization  program which includes automated exposure control, adjustment of the mA and/or kV according to patient size and/or use of iterative reconstruction technique. COMPARISON:  None Available. FINDINGS: Brain: No midline shift, ventriculomegaly, mass effect, evidence of mass lesion, intracranial hemorrhage or evidence of cortically based acute  infarction. Gray-white differentiation is within normal limits for age. Vascular: No suspicious intracranial vascular hyperdensity. Mild Calcified atherosclerosis at the skull base. Skull: Intact. No acute osseous abnormality identified. Sinuses/Orbits: Visualized paranasal sinuses and mastoids are clear. Other: Visualized orbits and scalp soft tissues are within normal limits. IMPRESSION: No acute intracranial abnormality. Normal for age noncontrast CT appearance of the brain. Electronically Signed   By: VEAR Hurst M.D.   On: 04/04/2024 05:12     Signature  -   Brayton Lye M.D on 04/05/2024 at 1:54 PM   -  To page go to [email or web address]

## 2024-04-06 DIAGNOSIS — M726 Necrotizing fasciitis: Secondary | ICD-10-CM | POA: Diagnosis not present

## 2024-04-06 LAB — COMPREHENSIVE METABOLIC PANEL WITH GFR
ALT: 12 U/L (ref 0–44)
AST: 15 U/L (ref 15–41)
Albumin: 1.8 g/dL — ABNORMAL LOW (ref 3.5–5.0)
Alkaline Phosphatase: 68 U/L (ref 38–126)
Anion gap: 11 (ref 5–15)
BUN: 35 mg/dL — ABNORMAL HIGH (ref 8–23)
CO2: 21 mmol/L — ABNORMAL LOW (ref 22–32)
Calcium: 8.6 mg/dL — ABNORMAL LOW (ref 8.9–10.3)
Chloride: 113 mmol/L — ABNORMAL HIGH (ref 98–111)
Creatinine, Ser: 1.29 mg/dL — ABNORMAL HIGH (ref 0.61–1.24)
GFR, Estimated: >60 mL/min (ref >60)
Glucose, Bld: 131 mg/dL — ABNORMAL HIGH (ref 70–99)
Potassium: 4.8 mmol/L (ref 3.5–5.1)
Sodium: 145 mmol/L (ref 135–145)
Total Bilirubin: 0.9 mg/dL (ref 0.0–1.2)
Total Protein: 6.4 g/dL — ABNORMAL LOW (ref 6.5–8.1)

## 2024-04-06 LAB — CBC WITH DIFFERENTIAL/PLATELET
Abs Immature Granulocytes: 0.04 K/uL (ref 0.00–0.07)
Basophils Absolute: 0 K/uL (ref 0.0–0.1)
Basophils Relative: 0 %
Eosinophils Absolute: 0.1 K/uL (ref 0.0–0.5)
Eosinophils Relative: 1 %
HCT: 28 % — ABNORMAL LOW (ref 39.0–52.0)
Hemoglobin: 8.6 g/dL — ABNORMAL LOW (ref 13.0–17.0)
Immature Granulocytes: 0 %
Lymphocytes Relative: 19 %
Lymphs Abs: 1.8 K/uL (ref 0.7–4.0)
MCH: 28.8 pg (ref 26.0–34.0)
MCHC: 30.7 g/dL (ref 30.0–36.0)
MCV: 93.6 fL (ref 80.0–100.0)
Monocytes Absolute: 0.3 K/uL (ref 0.1–1.0)
Monocytes Relative: 4 %
Neutro Abs: 7.4 K/uL (ref 1.7–7.7)
Neutrophils Relative %: 76 %
Platelets: 196 K/uL (ref 150–400)
RBC: 2.99 MIL/uL — ABNORMAL LOW (ref 4.22–5.81)
RDW: 18.4 % — ABNORMAL HIGH (ref 11.5–15.5)
WBC: 9.7 K/uL (ref 4.0–10.5)
nRBC: 0 % (ref 0.0–0.2)

## 2024-04-06 LAB — PROCALCITONIN: Procalcitonin: 1.38 ng/mL

## 2024-04-06 MED ORDER — ALUM & MAG HYDROXIDE-SIMETH 200-200-20 MG/5ML PO SUSP
30.0000 mL | Freq: Four times a day (QID) | ORAL | Status: DC | PRN
  Administered 2024-04-07 – 2024-04-18 (×2): 30 mL via ORAL
  Filled 2024-04-06 (×2): qty 30

## 2024-04-06 NOTE — Progress Notes (Signed)
 Patient refused to have his sacrum dressing changed.

## 2024-04-06 NOTE — Plan of Care (Signed)

## 2024-04-06 NOTE — Progress Notes (Signed)
 PROGRESS NOTE                                                                                                                                                                                                             Patient Demographics:    Fred Mcintosh, is a 63 y.o. male, DOB - 08/30/1960, FMW:[id number]  Outpatient Primary MD for the patient is Fred Lung, MD    LOS - 10  Admit date - 03/27/2024    Chief Complaint  Patient presents with   Altered Mental Status       Brief Narrative (HPI from H&P)    63 year old male with past medical history of diabetes, hypertension, asthma, hyperthyroidism/Graves', recent left foot amputation who presented to the emergency department on 9/10 AM with complaint of altered mental status. Per EDP, family called 911 after patient reportedly laying in bed for 9 days. With EMS, blood glucose out of range, noted coffee-ground emesis and dark tarry stools. On arrival to ED, tachycardic to 130s, lethargic. Left foot noted to be dehisced with exposed bone, foul drainage and subcutaneous emphysema up the shin. Labs notable for lactic acid 5.4, Na 128, K 5.9, Cl 89, bicarb 16, BG 816, BUN 101, sCr 2.57, alk phos 127, Tbili 3.3, AG 23, WBC 31.4, Hgb 9, plt 440, INR 1.5, BHB 3.71, UA with trace leukocytes. Blood cultures were drawn and pending. CXR with no acute abnormality. Left foot xray with air around the stump and lower half of leg. Obvious concern for necrotizing fasciitis with septic shock, HHS.   He was given 3L LR fluid resuscitation, started on Zyvox  and Zosyn , insulin  drip. Requiring low dose levophed  despite fluid resuscitation. Orthopedic surgery consulted. Dr. Harden to take him to OR later today. 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 Harden, seen by ID, PCCM and orthopedics stabilized and transferred to hospitalist  service under my care on 04/01/2024.     Significant Hospital Events: Including procedures, antibiotic start and stop dates in addition to other pertinent events           9/10: ED for septic shock vs left foot wound and nec fasc, HHS. Ortho to take to OR, CCM to admit ICU  9/11 POD#1 s/p L AKA, wound vac in place. Extubated  03/31/2024.  Echocardiogram with bubble study.  1. Left ventricular ejection fraction, by estimation, is 70 to 75%. The left ventricle has hyperdynamic function. Left ventricular endocardial border not optimally defined to evaluate regional wall motion. There is moderate concentric left ventricular hypertrophy. Left ventricular diastolic parameters are consistent with Grade I diastolic dysfunction (impaired relaxation).  2. Right ventricular systolic function was not well visualized. The right ventricular size is normal.  3. The mitral valve is normal in structure. No evidence of mitral valve regurgitation. No evidence of mitral stenosis.  4. There is a bright mobile target on the ventricular side of the TV leaflets that is worrisome for vegetation. The tricuspid valve is abnormal.  5. The aortic valve is tricuspid. Aortic valve regurgitation is not visualized. Aortic valve sclerosis/calcification is present, without any evidence of aortic stenosis. Aortic valve area, by VTI measures 3.16 cm. Aortic valve mean gradient measures 5.0 mmHg. Aortic valve Vmax measures 1.34 m/s.  6. The inferior vena cava is normal in size with greater than 50% respiratory variability, suggesting right atrial pressure of 3 mmHg.     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 Harden, has left AKA with a wound VAC, seen by ID as  well, biotics management per ID.  - Echocardiogram shows questionable growth on tricuspid valve, but TEE negative for endocarditis - Cussed with ID, recommendation for 2 weeks antibiotic from negative blood cultures, estimated end of treatment 9/25, to remain on IV penicillin  during hospital stay, and can transition to cefadroxil 1 g twice daily upon discharge   Acute Metabolic encephalopathy - due to #1 above, no headache or focal deficits, treat sepsis. - CT head with no acute findings - Improving, but not back to baseline he remains with intermittent confusion  Dehydration, hypernatremia and acute kidney injury - due to sepsis hydrate and monitor.  - Passed bedside swallow evaluation, encouraged to increase his fluid intake.  He remains with very poor appetite though. - Remains with very poor appetite, nutritionist consulted, given his recent surgery, infection, and very low albumin they have discussed with him tube feeding, but he declined for now, but he reports he will crease his supplements intake and oral intake, he is currently on calorie count, followed by dietitian closely . - Started on low-dose Remeron  to encourage his appetite . - Hypernatremia resolved, encouraged to increase fluid intake  Ongoing dysphagia.   - Passed swallow evaluation, advance to regular diet  Elevated Tbili- f/u LFTs this am  Hypothyroidism- continue PTA synthroid , needs OP f/u  History of hypertension no acute issues continue to monitor  History of asthma no wheezing, supportive care.  Hx of infrequent medical care- complicating control of above  Physical deconditioning.  PT OT, may require placement   DM2 with hyperglycemia -Now he is taking oral he is started on Lantus , CBG remains on the higher side so we will increase Lantus  to 15 units daily   Lab Results  Component Value Date   HGBA1C 6.7 (H) 03/27/2024    CBG (last 3)  Recent Labs    04/04/24 2224 04/05/24 0817 04/05/24 1202   GLUCAP 248* 156* 149*         Condition -  Extremely Guarded  Family Communication  : None present  Code Status : Full code  Consults  : PCCM, orthopedics, ID  PUD Prophylaxis :     Procedures  :            Disposition Plan  :    Status is: Inpatient   DVT Prophylaxis  :    SCD's Start: 03/27/24 1747 heparin  injection 5,000 Units Start: 03/27/24 1400    Lab Results  Component Value Date   PLT 196 04/06/2024    Diet :  Diet Order             Diet regular Room service appropriate? No; Fluid consistency: Thin  Diet effective now                    Inpatient Medications  Scheduled Meds:  (feeding supplement) PROSource Plus  30 mL Oral BID BM   sodium chloride    Intravenous Once   acetaminophen   650 mg Oral Q6H WA   vitamin C   1,000 mg Oral Daily   Chlorhexidine  Gluconate Cloth  6 each Topical Daily   docusate sodium   100 mg Oral BID   feeding supplement  237 mL Oral QID   heparin   5,000 Units Subcutaneous Q8H   insulin  aspart  0-15 Units Subcutaneous TID WC   insulin  aspart  0-5 Units Subcutaneous QHS   insulin  aspart  4 Units Subcutaneous TID WC   insulin  glargine  15 Units Subcutaneous Q24H   leptospermum manuka honey  1 Application Topical Daily   levothyroxine   50 mcg Oral Q0600   mirtazapine   15 mg Oral QHS   nutrition supplement (JUVEN)  1 packet Oral BID AC & HS   pantoprazole  (PROTONIX ) IV  40 mg Intravenous Q24H   polyethylene glycol  17 g Oral Daily   zinc  sulfate (50mg  elemental zinc )  220 mg Oral Daily   Continuous Infusions:  penicillin  G potassium 24 Million Units in dextrose  5 % 500 mL CONTINUOUS infusion 24 Million Units (04/06/24 1058)   PRN Meds:.albuterol , alum & mag hydroxide-simeth, dextrose , fentaNYL  (SUBLIMAZE ) injection, hydrALAZINE , labetalol , methocarbamol  (ROBAXIN ) injection, ondansetron  (ZOFRAN ) IV, mouth rinse, oxyCODONE     Objective:   Vitals:   04/06/24 0402 04/06/24 0408 04/06/24 0800 04/06/24 1148  BP:  127/83  131/75 109/67  Pulse: 100  92 92  Resp: 16  15 18   Temp: 98.4 F (36.9 C)  98.7 F (37.1 C) 99.3 F (37.4 C)  TempSrc: Oral  Oral Oral  SpO2: 95%  95% 99%  Weight:  116.1 kg    Height:        Wt Readings from Last 3 Encounters:  04/06/24 116.1 kg  03/08/24 (!) 141.5 kg  01/18/24 (!) 153.3 kg     Intake/Output Summary (Last 24 hours) at 04/06/2024 1235 Last data filed at 04/06/2024 0408 Gross per 24 hour  Intake 120 ml  Output 800 ml  Net -680 ml     Physical Exam  Awake Alert, x 2, slow to respond, remains confused Symmetrical Chest wall movement, Good air movement bilaterally, RRR,No Gallops,Rubs or new Murmurs, No Parasternal Heave +ve B.Sounds, Abd Soft, No tenderness, No rebound - guarding or rigidity. No Cyanosis, left AKA.      Data Review:    Recent Labs  Lab 04/02/24 0419 04/03/24 0357 04/04/24 0256 04/05/24 0259 04/06/24 0355  WBC 11.4* 10.4 10.0 10.0 9.7  HGB 8.6* 9.0* 9.6* 8.2* 8.6*  HCT 28.5* 29.8* 32.2* 26.9* 28.0*  PLT 142* 151 185 187 196  MCV 94.4 95.2 95.3 93.1 93.6  MCH 28.5 28.8 28.4 28.4 28.8  MCHC 30.2 30.2 29.8* 30.5 30.7  RDW 17.5* 18.1* 18.4* 18.1* 18.4*  LYMPHSABS 1.6 1.2 1.2 1.6 1.8  MONOABS 0.5 0.4 0.4 0.4 0.3  EOSABS 0.1 0.1 0.1 0.1 0.1  BASOSABS 0.0 0.0 0.0 0.0 0.0    Recent Labs  Lab 04/01/24 0427 04/01/24 0630 04/01/24 1108 04/02/24 0419 04/03/24 0357 04/04/24 0256 04/05/24 0259 04/06/24 0355  NA  --    < >  --  151* 150* 149* 148* 145  K  --    < >  --  4.8 5.1 4.6 4.8 4.8  CL  --    < >  --  116* 118* 113* 112* 113*  CO2  --    < >  --  26 27 25 23  21*  ANIONGAP  --    < >  --  9 5 11 13 11   GLUCOSE  --    < >  --  123* 205* 213* 205* 131*  BUN  --    < >  --  72* 61* 51* 46* 35*  CREATININE  --    < >  --  1.47* 1.47* 1.49* 1.23 1.29*  AST  --    < >  --  18 17 18 16 15   ALT  --    < >  --  11 12 14 12 12   ALKPHOS  --    < >  --  67 66 81 71 68  BILITOT  --    < >  --  0.8 0.7 1.1 0.8 0.9   ALBUMIN  --    < >  --  1.6* 1.6* 1.9* 1.7* 1.8*  CRP  --   --  2.9* 1.7* 2.1* 3.9* 4.8*  --   PROCALCITON 13.19  --   --  7.59 5.00 3.29 2.06 1.38  MG 3.1*  --   --  2.8* 2.7* 2.7* 2.3  --   PHOS 3.5  --   --  4.0 4.0 3.6 3.3  --   CALCIUM   --    < >  --  8.5* 8.5* 8.8* 8.5* 8.6*   < > = values in this interval not displayed.      Recent Labs  Lab 04/01/24 0427 04/01/24 0630 04/01/24 1108 04/02/24 0419 04/03/24 0357 04/04/24 0256 04/05/24 0259 04/06/24 0355  CRP  --   --  2.9* 1.7* 2.1* 3.9* 4.8*  --   PROCALCITON 13.19  --   --  7.59 5.00 3.29 2.06 1.38  MG 3.1*  --   --  2.8* 2.7* 2.7* 2.3  --   CALCIUM   --    < >  --  8.5* 8.5* 8.8* 8.5* 8.6*   < > = values in this interval not displayed.    --------------------------------------------------------------------------------------------------------------- Lab Results  Component Value Date   CHOL 166 01/18/2024   HDL 15 (L) 01/18/2024   LDLCALC 127 (H) 01/18/2024   TRIG 169 (H) 03/28/2024   CHOLHDL 11.1 01/18/2024    Lab Results  Component Value Date   HGBA1C 6.7 (H) 03/27/2024      Micro Results Recent Results (from the past 240 hours)  Culture, blood (Routine X 2) w Reflex to ID Panel     Status: None   Collection Time: 03/29/24 10:52 AM   Specimen: BLOOD LEFT HAND  Result Value Ref Range Status  Specimen Description BLOOD LEFT HAND  Final   Special Requests   Final    BOTTLES DRAWN AEROBIC AND ANAEROBIC Blood Culture results may not be optimal due to an inadequate volume of blood received in culture bottles   Culture   Final    NO GROWTH 5 DAYS Performed at [facility], [address], [place], [place] [address]    Report Status 04/03/2024 FINAL  Final  Culture, blood (Routine X 2) w Reflex to ID Panel     Status: None   Collection Time: 03/29/24 10:52 AM   Specimen: BLOOD LEFT HAND  Result Value Ref Range Status   Specimen Description BLOOD LEFT HAND  Final   Special Requests   Final     BOTTLES DRAWN AEROBIC AND ANAEROBIC Blood Culture results may not be optimal due to an inadequate volume of blood received in culture bottles   Culture   Final    NO GROWTH 5 DAYS Performed at [facility], [address], [place], [place] [address]    Report Status 04/03/2024 FINAL  Final    Radiology Report No results found.    Signature  -   Brayton Lye M.D on 04/06/2024 at 12:35 PM   -  To page go to [email or web address]

## 2024-04-06 NOTE — Plan of Care (Signed)
  Problem: Education: Goal: Knowledge of General Education information will improve Description: Including pain rating scale, medication(s)/side effects and non-pharmacologic comfort measures Outcome: Progressing   Problem: Health Behavior/Discharge Planning: Goal: Ability to manage health-related needs will improve Outcome: Progressing   Problem: Clinical Measurements: Goal: Ability to maintain clinical measurements within normal limits will improve Outcome: Progressing Goal: Will remain free from infection Outcome: Progressing Goal: Diagnostic test results will improve Outcome: Progressing Goal: Respiratory complications will improve Outcome: Progressing Goal: Cardiovascular complication will be avoided Outcome: Progressing   Problem: Activity: Goal: Risk for activity intolerance will decrease Outcome: Progressing   Problem: Nutrition: Goal: Adequate nutrition will be maintained Outcome: Progressing   Problem: Coping: Goal: Level of anxiety will decrease Outcome: Progressing   Problem: Elimination: Goal: Will not experience complications related to bowel motility Outcome: Progressing Goal: Will not experience complications related to urinary retention Outcome: Progressing   Problem: Pain Managment: Goal: General experience of comfort will improve and/or be controlled Outcome: Progressing   Problem: Safety: Goal: Ability to remain free from injury will improve Outcome: Progressing   Problem: Skin Integrity: Goal: Risk for impaired skin integrity will decrease Outcome: Progressing   Problem: Education: Goal: Ability to describe self-care measures that may prevent or decrease complications (Diabetes Survival Skills Education) will improve Outcome: Progressing Goal: Individualized Educational Video(s) Outcome: Progressing   Problem: Coping: Goal: Ability to adjust to condition or change in health will improve Outcome: Progressing   Problem: Fluid  Volume: Goal: Ability to maintain a balanced intake and output will improve Outcome: Progressing   Problem: Health Behavior/Discharge Planning: Goal: Ability to identify and utilize available resources and services will improve Outcome: Progressing Goal: Ability to manage health-related needs will improve Outcome: Progressing   Problem: Metabolic: Goal: Ability to maintain appropriate glucose levels will improve Outcome: Progressing   Problem: Nutritional: Goal: Maintenance of adequate nutrition will improve Outcome: Progressing Goal: Progress toward achieving an optimal weight will improve Outcome: Progressing   Problem: Skin Integrity: Goal: Risk for impaired skin integrity will decrease Outcome: Progressing   Problem: Tissue Perfusion: Goal: Adequacy of tissue perfusion will improve Outcome: Progressing   Problem: Education: Goal: Knowledge of the prescribed therapeutic regimen will improve Outcome: Progressing Goal: Ability to verbalize activity precautions or restrictions will improve Outcome: Progressing Goal: Understanding of discharge needs will improve Outcome: Progressing   Problem: Activity: Goal: Ability to perform//tolerate increased activity and mobilize with assistive devices will improve Outcome: Progressing   Problem: Clinical Measurements: Goal: Postoperative complications will be avoided or minimized Outcome: Progressing   Problem: Self-Care: Goal: Ability to meet self-care needs will improve Outcome: Progressing   Problem: Self-Concept: Goal: Ability to maintain and perform role responsibilities to the fullest extent possible will improve Outcome: Progressing   Problem: Pain Management: Goal: Pain level will decrease with appropriate interventions Outcome: Progressing   Problem: Skin Integrity: Goal: Demonstration of wound healing without infection will improve Outcome: Progressing   Problem: Activity: Goal: Ability to tolerate  increased activity will improve Outcome: Progressing   Problem: Respiratory: Goal: Ability to maintain a clear airway and adequate ventilation will improve Outcome: Progressing   Problem: Role Relationship: Goal: Method of communication will improve Outcome: Progressing   Problem: Safety: Goal: Non-violent Restraint(s) Outcome: Progressing

## 2024-04-06 NOTE — Progress Notes (Signed)
 Occupational Therapy Treatment Patient Details Name: Fred Mcintosh MRN: [id number] DOB: Dec 05, 1960 Today's Date: 04/06/2024   History of present illness 63 y.o. 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 Seen for skilled OT treatment session.  Initially agreeable to participation.  Completed bed level grooming task with set up and cues for thoroughness.  Able to use BUEs to reach mouth and face without issue.  Attempted HEP for BUEs. Pt. Declined all efforts for further participation despite explanation.  Cont. To progress ADLs/HEP next session.  Cont. With acute OT POC.        If plan is discharge home, recommend the following:  Two people to help with walking and/or transfers;Two people to help with bathing/dressing/bathroom;Assistance with cooking/housework;Assistance with feeding;Direct supervision/assist for medications management;Direct supervision/assist for financial management;Assist for transportation;Help with stairs or ramp for entrance   Equipment Recommendations       Recommendations for Other Services PT consult    Precautions / Restrictions Precautions Precautions: Fall;Other (comment) Recall of Precautions/Restrictions: Impaired Precaution/Restrictions Comments: Lt AKA, sacral wound Required Braces or Orthoses: Other Brace Other Brace: Post-Op Shoe LLE Restrictions LLE Weight Bearing Per Provider Order: Non weight bearing       Mobility Bed Mobility                    Transfers                         Balance                                           ADL either performed or assessed with clinical judgement   ADL Overall ADL's : Needs assistance/impaired     Grooming: Wash/dry face;Bed level Grooming Details (indicate cue type and reason):  pt. able to use BUEs to bring to face and mouth for grooming task of washing face, followed cues more on the left side of nose and moved to the left side with accuracy                               General ADL Comments: particpated in grooming task and demonstrated use of BUEs to reach face and mouth, session limited as pt. began to refuse all other suggestions and attempted at other adls and exercises    Extremity/Trunk Assessment              Vision       Perception     Praxis     Communication Communication Communication: No apparent difficulties   Cognition Arousal: Alert Behavior During Therapy: Flat affect Cognition: Cognition impaired   Orientation impairments: Situation Awareness: Intellectual awareness impaired, Online awareness impaired Memory impairment (select all impairments): Short-term memory, Working memory, Non-declarative long-term memory, Geneticist, molecular long-term memory Attention impairment (select first level of impairment): Focused attention Executive functioning impairment (select all impairments): Initiation, Organization, Sequencing, Reasoning, Problem solving OT - Cognition Comments: pt. able to state name, b.date, hospital, where he lives/grew up, h.school name, address-but cont. to reference a board that lists job duties and that im not on that board, im not doing it in  refernce to anything he is being asked to do or not do to get better/stronger.  he cont. to reference that since hes not on the board hes not doing it                 Following commands: Impaired Following commands impaired: Follows one step commands inconsistently, Follows one step commands with increased time      Cueing   Cueing Techniques: Verbal cues, Visual cues, Tactile cues, Gestural cues  Exercises Other Exercises Other Exercises: attempted BUE HEP. pt. squeezed foam multiple times in both hands than refused further instruction or attempts with  theraband.  Referencing the board again (see cog section) and used it over and over as his reason he did not have to participate I'm not on the board I'm not here working I dont have to do that. Appeared to be confused that doing the exercises or therapy was work related.  Attempted multiple times to explain part of healing and getting stronger, he cont. To say he needed sleep and that was going to help with everything I was saying.      Shoulder Instructions       General Comments      Pertinent Vitals/ Pain       Pain Assessment Pain Assessment: No/denies pain  Home Living                                          Prior Functioning/Environment              Frequency  Min 2X/week        Progress Toward Goals  OT Goals(current goals can now be found in the care plan section)  Progress towards OT goals: Progressing toward goals     Plan      Co-evaluation                 AM-PAC OT 6 Clicks Daily Activity     Outcome Measure   Help from another person eating meals?: Total Help from another person taking care of personal grooming?: Total Help from another person toileting, which includes using toliet, bedpan, or urinal?: Total Help from another person bathing (including washing, rinsing, drying)?: Total Help from another person to put on and taking off regular upper body clothing?: Total Help from another person to put on and taking off regular lower body clothing?: Total 6 Click Score: 6    End of Session    OT Visit Diagnosis: Muscle weakness (generalized) (M62.81);Other symptoms and signs involving cognitive function;Pain   Activity Tolerance Other (comment) (pt. refusing further therapeutic attempts)   Patient Left in bed;with call bell/phone within reach;with bed alarm set;with nursing/sitter in room   Nurse Communication Other (comment) (rn states ok to work with pt. today)        Time: 8890-8882 OT Time Calculation  (min): 8 min  Charges: OT General Charges $OT Visit: 1 Visit OT Treatments $Therapeutic Activity: 8-22 mins  Randall, COTA/L Acute Rehabilitation [phone]   Fred Mcintosh Nest [place]-COTA/L  04/06/2024, 12:58 PM

## 2024-04-07 DIAGNOSIS — M726 Necrotizing fasciitis: Secondary | ICD-10-CM | POA: Diagnosis not present

## 2024-04-07 LAB — CBC
HCT: 26.8 % — ABNORMAL LOW (ref 39.0–52.0)
Hemoglobin: 8.1 g/dL — ABNORMAL LOW (ref 13.0–17.0)
MCH: 28.6 pg (ref 26.0–34.0)
MCHC: 30.2 g/dL (ref 30.0–36.0)
MCV: 94.7 fL (ref 80.0–100.0)
Platelets: 184 K/uL (ref 150–400)
RBC: 2.83 MIL/uL — ABNORMAL LOW (ref 4.22–5.81)
RDW: 18.2 % — ABNORMAL HIGH (ref 11.5–15.5)
WBC: 7.8 K/uL (ref 4.0–10.5)
nRBC: 0 % (ref 0.0–0.2)

## 2024-04-07 LAB — BASIC METABOLIC PANEL WITH GFR
Anion gap: 6 (ref 5–15)
BUN: 34 mg/dL — ABNORMAL HIGH (ref 8–23)
CO2: 22 mmol/L (ref 22–32)
Calcium: 8.4 mg/dL — ABNORMAL LOW (ref 8.9–10.3)
Chloride: 115 mmol/L — ABNORMAL HIGH (ref 98–111)
Creatinine, Ser: 1.39 mg/dL — ABNORMAL HIGH (ref 0.61–1.24)
GFR, Estimated: 57 mL/min — ABNORMAL LOW (ref >60)
Glucose, Bld: 231 mg/dL — ABNORMAL HIGH (ref 70–99)
Potassium: 5.1 mmol/L (ref 3.5–5.1)
Sodium: 143 mmol/L (ref 135–145)

## 2024-04-07 LAB — PHOSPHORUS: Phosphorus: 4.1 mg/dL (ref 2.5–4.6)

## 2024-04-07 MED ORDER — ENSURE PLUS HIGH PROTEIN PO LIQD
237.0000 mL | Freq: Every day | ORAL | Status: DC
Start: 2024-04-07 — End: 2024-04-18
  Administered 2024-04-07 – 2024-04-17 (×43): 237 mL via ORAL

## 2024-04-07 MED ORDER — BOOST / RESOURCE BREEZE PO LIQD CUSTOM
1.0000 | Freq: Three times a day (TID) | ORAL | Status: DC
  Administered 2024-04-07: 1 via ORAL

## 2024-04-07 NOTE — Plan of Care (Signed)

## 2024-04-07 NOTE — Plan of Care (Signed)
   Problem: Clinical Measurements: Goal: Diagnostic test results will improve Outcome: Progressing   Problem: Nutrition: Goal: Adequate nutrition will be maintained Outcome: Progressing

## 2024-04-07 NOTE — Progress Notes (Signed)
 PROGRESS NOTE                                                                                                                                                                                                             Patient Demographics:    Fred Mcintosh, is a 63 y.o. male, DOB - 11-08-1960, FMW:[id number]  Outpatient Primary MD for the patient is [name], MD    LOS - 11  Admit date - 03/27/2024    Chief Complaint  Patient presents with   Altered Mental Status       Brief Narrative (HPI from H&P)    63 year old male with past medical history of diabetes, hypertension, asthma, hyperthyroidism/Graves', recent left foot amputation who presented to the emergency department on 9/10 AM with complaint of altered mental status. Per EDP, family called 911 after patient reportedly laying in bed for 9 days. With EMS, blood glucose out of range, noted coffee-ground emesis and dark tarry stools. On arrival to ED, tachycardic to 130s, lethargic. Left foot noted to be dehisced with exposed bone, foul drainage and subcutaneous emphysema up the shin. Labs notable for lactic acid 5.4, Na 128, K 5.9, Cl 89, bicarb 16, BG 816, BUN 101, sCr 2.57, alk phos 127, Tbili 3.3, AG 23, WBC 31.4, Hgb 9, plt 440, INR 1.5, BHB 3.71, UA with trace leukocytes. Blood cultures were drawn and pending. CXR with no acute abnormality. Left foot xray with air around the stump and lower half of leg. Obvious concern for necrotizing fasciitis with septic shock, HHS.   He was given 3L LR fluid resuscitation, started on Zyvox  and Zosyn , insulin  drip. Requiring low dose levophed  despite fluid resuscitation. Orthopedic surgery consulted. Dr. Harden to take him to OR later today. 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 Harden, seen by ID, PCCM and orthopedics stabilized and transferred to hospitalist  service under my care on 04/01/2024.     Significant Hospital Events: Including procedures, antibiotic start and stop dates in addition to other pertinent events           9/10: ED for septic shock vs left foot wound and nec fasc, HHS. Ortho to take to OR, CCM to admit ICU  9/11 POD#1 s/p L AKA, wound vac in place. Extubated  03/31/2024.  Echocardiogram with bubble study.  1. Left ventricular ejection fraction, by estimation, is 70 to 75%. The left ventricle has hyperdynamic function. Left ventricular endocardial border not optimally defined to evaluate regional wall motion. There is moderate concentric left ventricular hypertrophy. Left ventricular diastolic parameters are consistent with Grade I diastolic dysfunction (impaired relaxation).  2. Right ventricular systolic function was not well visualized. The right ventricular size is normal.  3. The mitral valve is normal in structure. No evidence of mitral valve regurgitation. No evidence of mitral stenosis.  4. There is a bright mobile target on the ventricular side of the TV leaflets that is worrisome for vegetation. The tricuspid valve is abnormal.  5. The aortic valve is tricuspid. Aortic valve regurgitation is not visualized. Aortic valve sclerosis/calcification is present, without any evidence of aortic stenosis. Aortic valve area, by VTI measures 3.16 cm. Aortic valve mean gradient measures 5.0 mmHg. Aortic valve Vmax measures 1.34 m/s.  6. The inferior vena cava is normal in size with greater than 50% respiratory variability, suggesting right atrial pressure of 3 mmHg.     Subjective:   Remains with poor appetite, but reports he has been drinking his ensures  Assessment  & Plan :   Sepsis 2/2 necrotizing fasciitis of acute on chronic left lower extremity wound, streptococcal bacteremia  - he was admitted to ICU by PCCM, underwent  L AKA 03/27/24 by Dr. Harden, has left AKA with a wound VAC, seen by ID as well, biotics management per ID.  -  Echocardiogram shows questionable growth on tricuspid valve, but TEE negative for endocarditis - Cussed with ID, recommendation for 2 weeks antibiotic from negative blood cultures, estimated end of treatment 9/25, to remain on IV penicillin  during hospital stay, and can transition to cefadroxil 1 g twice daily upon discharge   Acute Metabolic encephalopathy - due to #1 above, no headache or focal deficits, treat sepsis. - CT head with no acute findings - Improving  Dehydration, hypernatremia and acute kidney injury - due to sepsis hydrate and monitor.  - Passed bedside swallow evaluation, encouraged to increase his fluid intake.  He remains with very poor appetite though. - Remains with very poor appetite, nutritionist consulted, given his recent surgery, infection, and very low albumin they have discussed with him tube feeding, but he declined for now, but he reports he will crease his supplements intake and oral intake, he is currently on calorie count, followed by dietitian closely . - Started on low-dose Remeron  to encourage his appetite .  He is with very poor appetite, but he drinks his ensures, so I we will increase his Ensure even up to 5 times daily, and will add boost as well - Hypernatremia resolved, encouraged to increase fluid intake  Ongoing dysphagia.   - Passed swallow evaluation, advanced to regular diet  Elevated Tbili- f/u LFTs this am  Hypothyroidism- continue PTA synthroid , needs OP f/u  History of hypertension no acute issues continue to monitor  History of asthma no wheezing, supportive care.  Hx of infrequent medical care- complicating control of above  Physical deconditioning.  PT OT, may require placement   DM2 with hyperglycemia -Now he is taking oral he is started on Lantus , CBG remains on the higher side so we will increase Lantus  to 15 units daily   Lab Results  Component Value Date   HGBA1C 6.7 (H) 03/27/2024    CBG (last 3)  Recent Labs     04/04/24 2224 04/05/24 0817 04/05/24  1202  GLUCAP 248* 156* 149*         Condition - Extremely Guarded  Family Communication  : None present  Code Status : Full code  Consults  : PCCM, orthopedics, ID  PUD Prophylaxis :     Procedures  :            Disposition Plan  :    Status is: Inpatient   DVT Prophylaxis  :    SCD's Start: 03/27/24 1747 heparin  injection 5,000 Units Start: 03/27/24 1400    Lab Results  Component Value Date   PLT 184 04/07/2024    Diet :  Diet Order             Diet regular Room service appropriate? No; Fluid consistency: Thin  Diet effective now                    Inpatient Medications  Scheduled Meds:  (feeding supplement) PROSource Plus  30 mL Oral BID BM   sodium chloride    Intravenous Once   acetaminophen   650 mg Oral Q6H WA   vitamin C   1,000 mg Oral Daily   Chlorhexidine  Gluconate Cloth  6 each Topical Daily   docusate sodium   100 mg Oral BID   feeding supplement  237 mL Oral QID   heparin   5,000 Units Subcutaneous Q8H   insulin  aspart  0-15 Units Subcutaneous TID WC   insulin  aspart  0-5 Units Subcutaneous QHS   insulin  aspart  4 Units Subcutaneous TID WC   insulin  glargine  15 Units Subcutaneous Q24H   leptospermum manuka honey  1 Application Topical Daily   levothyroxine   50 mcg Oral Q0600   mirtazapine   15 mg Oral QHS   nutrition supplement (JUVEN)  1 packet Oral BID AC & HS   pantoprazole  (PROTONIX ) IV  40 mg Intravenous Q24H   polyethylene glycol  17 g Oral Daily   zinc  sulfate (50mg  elemental zinc )  220 mg Oral Daily   Continuous Infusions:  penicillin  G potassium 24 Million Units in dextrose  5 % 500 mL CONTINUOUS infusion 24 Million Units (04/07/24 1004)   PRN Meds:.albuterol , alum & mag hydroxide-simeth, dextrose , hydrALAZINE , labetalol , methocarbamol  (ROBAXIN ) injection, ondansetron  (ZOFRAN ) IV, mouth rinse, oxyCODONE     Objective:   Vitals:   04/07/24 0500 04/07/24 0800 04/07/24 0924 04/07/24  1200  BP: 118/88 131/86  119/70  Pulse: 85 88 92 94  Resp: 12 10 15 16   Temp: 98.6 F (37 C)  98.5 F (36.9 C) 98.8 F (37.1 C)  TempSrc: Oral  Oral Oral  SpO2: 100% 100% 100% 100%  Weight:      Height:        Wt Readings from Last 3 Encounters:  04/06/24 116.1 kg  03/08/24 (!) 141.5 kg  01/18/24 (!) 153.3 kg     Intake/Output Summary (Last 24 hours) at 04/07/2024 1313 Last data filed at 04/07/2024 1030 Gross per 24 hour  Intake 477 ml  Output 1905 ml  Net -1428 ml     Physical Exam  Awake Alert, no apparent distress Symmetrical Chest wall movement, Good air movement bilaterally, CTAB RRR,No Gallops,Rubs or new Murmurs, No Parasternal Heave +ve B.Sounds, Abd Soft, No tenderness, No rebound - guarding or rigidity. No Cyanosis, left AKA.      Data Review:    Recent Labs  Lab 04/02/24 0419 04/03/24 0357 04/04/24 0256 04/05/24 0259 04/06/24 0355 04/07/24 0224  WBC 11.4* 10.4 10.0 10.0 9.7 7.8  HGB 8.6*  9.0* 9.6* 8.2* 8.6* 8.1*  HCT 28.5* 29.8* 32.2* 26.9* 28.0* 26.8*  PLT 142* 151 185 187 196 184  MCV 94.4 95.2 95.3 93.1 93.6 94.7  MCH 28.5 28.8 28.4 28.4 28.8 28.6  MCHC 30.2 30.2 29.8* 30.5 30.7 30.2  RDW 17.5* 18.1* 18.4* 18.1* 18.4* 18.2*  LYMPHSABS 1.6 1.2 1.2 1.6 1.8  --   MONOABS 0.5 0.4 0.4 0.4 0.3  --   EOSABS 0.1 0.1 0.1 0.1 0.1  --   BASOSABS 0.0 0.0 0.0 0.0 0.0  --     Recent Labs  Lab 04/01/24 0427 04/01/24 0630 04/01/24 1108 04/02/24 0419 04/03/24 0357 04/04/24 0256 04/05/24 0259 04/06/24 0355 04/07/24 0224  NA  --    < >  --  151* 150* 149* 148* 145 143  K  --    < >  --  4.8 5.1 4.6 4.8 4.8 5.1  CL  --    < >  --  116* 118* 113* 112* 113* 115*  CO2  --    < >  --  26 27 25 23  21* 22  ANIONGAP  --    < >  --  9 5 11 13 11 6   GLUCOSE  --    < >  --  123* 205* 213* 205* 131* 231*  BUN  --    < >  --  72* 61* 51* 46* 35* 34*  CREATININE  --    < >  --  1.47* 1.47* 1.49* 1.23 1.29* 1.39*  AST  --    < >  --  18 17 18 16 15   --    ALT  --    < >  --  11 12 14 12 12   --   ALKPHOS  --    < >  --  67 66 81 71 68  --   BILITOT  --    < >  --  0.8 0.7 1.1 0.8 0.9  --   ALBUMIN  --    < >  --  1.6* 1.6* 1.9* 1.7* 1.8*  --   CRP  --   --  2.9* 1.7* 2.1* 3.9* 4.8*  --   --   PROCALCITON 13.19  --   --  7.59 5.00 3.29 2.06 1.38  --   MG 3.1*  --   --  2.8* 2.7* 2.7* 2.3  --   --   PHOS 3.5  --   --  4.0 4.0 3.6 3.3  --  4.1  CALCIUM   --    < >  --  8.5* 8.5* 8.8* 8.5* 8.6* 8.4*   < > = values in this interval not displayed.      Recent Labs  Lab 04/01/24 0427 04/01/24 0630 04/01/24 1108 04/02/24 0419 04/03/24 0357 04/04/24 0256 04/05/24 0259 04/06/24 0355 04/07/24 0224  CRP  --   --  2.9* 1.7* 2.1* 3.9* 4.8*  --   --   PROCALCITON 13.19  --   --  7.59 5.00 3.29 2.06 1.38  --   MG 3.1*  --   --  2.8* 2.7* 2.7* 2.3  --   --   CALCIUM   --    < >  --  8.5* 8.5* 8.8* 8.5* 8.6* 8.4*   < > = values in this interval not displayed.    --------------------------------------------------------------------------------------------------------------- Lab Results  Component Value Date   CHOL 166 01/18/2024   HDL 15 (L) 01/18/2024   LDLCALC 127 (  H) 01/18/2024   TRIG 169 (H) 03/28/2024   CHOLHDL 11.1 01/18/2024    Lab Results  Component Value Date   HGBA1C 6.7 (H) 03/27/2024      Micro Results Recent Results (from the past 240 hours)  Culture, blood (Routine X 2) w Reflex to ID Panel     Status: None   Collection Time: 03/29/24 10:52 AM   Specimen: BLOOD LEFT HAND  Result Value Ref Range Status   Specimen Description BLOOD LEFT HAND  Final   Special Requests   Final    BOTTLES DRAWN AEROBIC AND ANAEROBIC Blood Culture results may not be optimal due to an inadequate volume of blood received in culture bottles   Culture   Final    NO GROWTH 5 DAYS Performed at [facility], [address] [place], [place] [address]    Report Status 04/03/2024 FINAL  Final  Culture, blood (Routine X 2) w Reflex to ID  Panel     Status: None   Collection Time: 03/29/24 10:52 AM   Specimen: BLOOD LEFT HAND  Result Value Ref Range Status   Specimen Description BLOOD LEFT HAND  Final   Special Requests   Final    BOTTLES DRAWN AEROBIC AND ANAEROBIC Blood Culture results may not be optimal due to an inadequate volume of blood received in culture bottles   Culture   Final    NO GROWTH 5 DAYS Performed at [facility], [address], [place], [place] [address]    Report Status 04/03/2024 FINAL  Final    Radiology Report No results found.    Signature  -   Brayton Lye M.D on 04/07/2024 at 1:13 PM   -  To page go to [email or web address]

## 2024-04-08 ENCOUNTER — Inpatient Hospital Stay (HOSPITAL_COMMUNITY)

## 2024-04-08 DIAGNOSIS — M726 Necrotizing fasciitis: Secondary | ICD-10-CM | POA: Diagnosis not present

## 2024-04-08 LAB — GLUCOSE, CAPILLARY
Glucose-Capillary: 125 mg/dL — ABNORMAL HIGH (ref 70–99)
Glucose-Capillary: 115 mg/dL — ABNORMAL HIGH (ref 70–99)
Glucose-Capillary: 143 mg/dL — ABNORMAL HIGH (ref 70–99)
Glucose-Capillary: 141 mg/dL — ABNORMAL HIGH (ref 70–99)
Glucose-Capillary: 182 mg/dL — ABNORMAL HIGH (ref 70–99)
Glucose-Capillary: 156 mg/dL — ABNORMAL HIGH (ref 70–99)
Glucose-Capillary: 138 mg/dL — ABNORMAL HIGH (ref 70–99)
Glucose-Capillary: 204 mg/dL — ABNORMAL HIGH (ref 70–99)
Glucose-Capillary: 139 mg/dL — ABNORMAL HIGH (ref 70–99)
Glucose-Capillary: 149 mg/dL — ABNORMAL HIGH (ref 70–99)
Glucose-Capillary: 252 mg/dL — ABNORMAL HIGH (ref 70–99)
Glucose-Capillary: 188 mg/dL — ABNORMAL HIGH (ref 70–99)
Glucose-Capillary: 224 mg/dL — ABNORMAL HIGH (ref 70–99)
Glucose-Capillary: 191 mg/dL — ABNORMAL HIGH (ref 70–99)
Glucose-Capillary: 123 mg/dL — ABNORMAL HIGH (ref 70–99)

## 2024-04-08 LAB — VITAMIN B12: Vitamin B-12: 1335 pg/mL — ABNORMAL HIGH (ref 180–914)

## 2024-04-08 LAB — BASIC METABOLIC PANEL WITH GFR
Anion gap: 8 (ref 5–15)
BUN: 35 mg/dL — ABNORMAL HIGH (ref 8–23)
CO2: 24 mmol/L (ref 22–32)
Calcium: 8.5 mg/dL — ABNORMAL LOW (ref 8.9–10.3)
Chloride: 109 mmol/L (ref 98–111)
Creatinine, Ser: 1.11 mg/dL (ref 0.61–1.24)
GFR, Estimated: >60 mL/min (ref >60)
Glucose, Bld: 210 mg/dL — ABNORMAL HIGH (ref 70–99)
Potassium: 5.1 mmol/L (ref 3.5–5.1)
Sodium: 141 mmol/L (ref 135–145)

## 2024-04-08 LAB — CBC
HCT: 26.2 % — ABNORMAL LOW (ref 39.0–52.0)
Hemoglobin: 8.1 g/dL — ABNORMAL LOW (ref 13.0–17.0)
MCH: 28.6 pg (ref 26.0–34.0)
MCHC: 30.9 g/dL (ref 30.0–36.0)
MCV: 92.6 fL (ref 80.0–100.0)
Platelets: 192 K/uL (ref 150–400)
RBC: 2.83 MIL/uL — ABNORMAL LOW (ref 4.22–5.81)
RDW: 17.8 % — ABNORMAL HIGH (ref 11.5–15.5)
WBC: 8.5 K/uL (ref 4.0–10.5)
nRBC: 0 % (ref 0.0–0.2)

## 2024-04-08 LAB — PHOSPHORUS: Phosphorus: 3.3 mg/dL (ref 2.5–4.6)

## 2024-04-08 MED ORDER — MIRTAZAPINE 15 MG PO TABS
30.0000 mg | ORAL_TABLET | Freq: Every day | ORAL | Status: DC
  Administered 2024-04-08 – 2024-04-30 (×23): 30 mg via ORAL
  Filled 2024-04-08 (×23): qty 2

## 2024-04-08 MED ORDER — HALOPERIDOL LACTATE 5 MG/ML IJ SOLN
5.0000 mg | Freq: Once | INTRAMUSCULAR | Status: AC | PRN
  Administered 2024-04-08: 5 mg via INTRAVENOUS
  Filled 2024-04-08: qty 1

## 2024-04-08 NOTE — Progress Notes (Signed)
 PROGRESS NOTE                                                                                                                                                                                                             Patient Demographics:    Fred Mcintosh, is a 63 y.o. male, DOB - [birth year], FMW:[id number]  Outpatient Primary MD for the patient is Fred Lung, MD    LOS - 12  Admit date - 03/27/2024    Chief Complaint  Patient presents with   Altered Mental Status       Brief Narrative (HPI from H&P)     63 year old male with past medical history of diabetes, hypertension, asthma, hyperthyroidism/Graves', recent left foot amputation who presented to the emergency department on 9/10 AM with complaint of altered mental status. Per EDP, family called 911 after patient reportedly laying in bed for 9 days. With EMS, blood glucose out of range, noted coffee-ground emesis and dark tarry stools. On arrival to ED, tachycardic to 130s, lethargic. Left foot noted to be dehisced with exposed bone, foul drainage and subcutaneous emphysema up the shin. Labs notable for lactic acid 5.4, Na 128, K 5.9, Cl 89, bicarb 16, BG 816, BUN 101, sCr 2.57, alk phos 127, Tbili 3.3, AG 23, WBC 31.4, Hgb 9, plt 440, INR 1.5, BHB 3.71, UA with trace leukocytes. Blood cultures were drawn and pending. CXR with no acute abnormality. Left foot xray with air around the stump and lower half of leg. Obvious concern for necrotizing fasciitis with septic shock, HHS.   He was given 3L LR fluid resuscitation, started on Zyvox  and Zosyn , insulin  drip. Requiring low dose levophed  despite fluid resuscitation. Orthopedic surgery consulted. Dr. Harden to take him to OR later today. 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 Harden, seen by ID, PCCM and orthopedics stabilized and transferred to hospitalist  service under my care on 04/01/2024.     Significant Hospital Events: Including procedures, antibiotic start and stop dates in addition to other pertinent events           9/10: ED for septic shock vs left foot wound and nec fasc, HHS. Ortho to take to OR, CCM to admit ICU  9/11 POD#1 s/p L AKA, wound vac in place.  Extubated     Subjective:   Remains with poor appetite, reports he is compliant with Ensure, not sure when was last time he moved his bowels.  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 Harden, has left AKA with a wound VAC, seen by ID as well, biotics management per ID.  - Echocardiogram shows questionable growth on tricuspid valve, but TEE negative for endocarditis - Discussed with ID, recommendation for 2 weeks antibiotic from negative blood cultures, which will be 14 days from 03/29/2024 , to remain on IV penicillin  during hospital stay, and can transition to cefadroxil 1 g twice daily upon discharge   Acute Metabolic encephalopathy - due to #1 above, no headache or focal deficits, treat sepsis. - CT head with no acute findings - Improving, but still not at baseline, remains significantly confused, will obtain MRI brain.  Dehydration, hypernatremia and acute kidney injury - due to sepsis hydrate and monitor.  - Passed bedside swallow evaluation, encouraged to increase his fluid intake.  He remains with very poor appetite though. - Remains with very poor appetite, nutritionist consulted, given his recent surgery, infection, and very low albumin they have discussed with him tube feeding, but he declined for now, but he reports he will crease his supplements intake and oral intake, he is currently on calorie count, followed by dietitian closely . - Started on low-dose Remeron  to encourage his appetite, will uptitrate today.   He is with very poor appetite, but he drinks his ensures, so I we will increase his Ensure even up to 5 times daily, and will add boost as well - Hypernatremia resolved, encouraged to increase fluid intake   dysphagia.   - Passed swallow evaluation, advanced to regular diet  Elevated Tbili- f/u LFTs this am  Hypothyroidism- continue PTA synthroid , needs OP f/u  History of hypertension no acute issues continue to monitor  History of asthma no wheezing, supportive care.  Hx of infrequent medical care- complicating control of above  Physical deconditioning.  PT OT, may require placement   DM2 with hyperglycemia -Now he is taking oral he is started on Lantus , CBG remains on the higher side so we will increase Lantus  to 15 units daily   Lab Results  Component Value Date   HGBA1C 6.7 (H) 03/27/2024    CBG (last 3)  Recent Labs    04/05/24 1202  GLUCAP 149*         Condition - Extremely Guarded  Family Communication  : None present, discussed with brother Fred Mcintosh by phone  Code Status : Full code  Consults  : PCCM, orthopedics, ID  PUD Prophylaxis :     Procedures  :            Disposition Plan  :    Status is: Inpatient   DVT Prophylaxis  :    SCD's Start: 03/27/24 1747 heparin  injection 5,000 Units Start: 03/27/24 1400    Lab Results  Component Value Date   PLT 192 04/08/2024    Diet :  Diet Order             Diet regular Room service appropriate? No; Fluid consistency: Thin  Diet effective now                    Inpatient Medications  Scheduled Meds:  (  feeding supplement) PROSource Plus  30 mL Oral BID BM   sodium chloride    Intravenous Once   acetaminophen   650 mg Oral Q6H WA   vitamin C   1,000 mg Oral Daily   Chlorhexidine  Gluconate Cloth  6 each Topical Daily   docusate sodium   100 mg Oral BID   feeding supplement  1 Container Oral TID BM   feeding supplement  237 mL Oral 5 X Daily   heparin   5,000 Units Subcutaneous Q8H    insulin  aspart  0-15 Units Subcutaneous TID WC   insulin  aspart  0-5 Units Subcutaneous QHS   insulin  aspart  4 Units Subcutaneous TID WC   insulin  glargine  15 Units Subcutaneous Q24H   leptospermum manuka honey  1 Application Topical Daily   levothyroxine   50 mcg Oral Q0600   mirtazapine   30 mg Oral QHS   nutrition supplement (JUVEN)  1 packet Oral BID AC & HS   pantoprazole  (PROTONIX ) IV  40 mg Intravenous Q24H   polyethylene glycol  17 g Oral Daily   zinc  sulfate (50mg  elemental zinc )  220 mg Oral Daily   Continuous Infusions:  penicillin  G potassium 24 Million Units in dextrose  5 % 500 mL CONTINUOUS infusion 24 Million Units (04/08/24 0953)   PRN Meds:.albuterol , alum & mag hydroxide-simeth, dextrose , haloperidol  lactate, hydrALAZINE , labetalol , methocarbamol  (ROBAXIN ) injection, ondansetron  (ZOFRAN ) IV, mouth rinse, oxyCODONE     Objective:   Vitals:   04/08/24 0340 04/08/24 0400 04/08/24 0704 04/08/24 0758  BP: 114/76 121/77  114/75  Pulse: 96 92  100  Resp: 16 16  (!) 23  Temp: 98.6 F (37 C)   98.9 F (37.2 C)  TempSrc: Oral   Oral  SpO2: 97% 99%  98%  Weight:   118.8 kg   Height:        Wt Readings from Last 3 Encounters:  04/08/24 118.8 kg  03/08/24 (!) 141.5 kg  01/18/24 (!) 153.3 kg     Intake/Output Summary (Last 24 hours) at 04/08/2024 1108 Last data filed at 04/08/2024 0100 Gross per 24 hour  Intake 480 ml  Output 1600 ml  Net -1120 ml     Physical Exam  Awake, Alert, in no apparent distress, confused Symmetrical Chest wall movement, Good air movement bilaterally, CTAB RRR,No Gallops,Rubs or new Murmurs, No Parasternal Heave +ve B.Sounds, Abd Soft, No tenderness, No rebound - guarding or rigidity. No Cyanosis, left AKA.      Data Review:    Recent Labs  Lab 04/02/24 0419 04/03/24 0357 04/04/24 0256 04/05/24 0259 04/06/24 0355 04/07/24 0224 04/08/24 0301  WBC 11.4* 10.4 10.0 10.0 9.7 7.8 8.5  HGB 8.6* 9.0* 9.6* 8.2* 8.6* 8.1* 8.1*   HCT 28.5* 29.8* 32.2* 26.9* 28.0* 26.8* 26.2*  PLT 142* 151 185 187 196 184 192  MCV 94.4 95.2 95.3 93.1 93.6 94.7 92.6  MCH 28.5 28.8 28.4 28.4 28.8 28.6 28.6  MCHC 30.2 30.2 29.8* 30.5 30.7 30.2 30.9  RDW 17.5* 18.1* 18.4* 18.1* 18.4* 18.2* 17.8*  LYMPHSABS 1.6 1.2 1.2 1.6 1.8  --   --   MONOABS 0.5 0.4 0.4 0.4 0.3  --   --   EOSABS 0.1 0.1 0.1 0.1 0.1  --   --   BASOSABS 0.0 0.0 0.0 0.0 0.0  --   --     Recent Labs  Lab 04/02/24 0419 04/03/24 0357 04/04/24 0256 04/05/24 0259 04/06/24 0355 04/07/24 0224 04/08/24 0301  NA 151* 150* 149* 148* 145 143 141  K 4.8 5.1 4.6 4.8 4.8 5.1 5.1  CL 116* 118* 113* 112* 113* 115* 109  CO2 26 27 25 23  21* 22 24  ANIONGAP 9 5 11 13 11 6 8   GLUCOSE 123* 205* 213* 205* 131* 231* 210*  BUN 72* 61* 51* 46* 35* 34* 35*  CREATININE 1.47* 1.47* 1.49* 1.23 1.29* 1.39* 1.11  AST 18 17 18 16 15   --   --   ALT 11 12 14 12 12   --   --   ALKPHOS 67 66 81 71 68  --   --   BILITOT 0.8 0.7 1.1 0.8 0.9  --   --   ALBUMIN 1.6* 1.6* 1.9* 1.7* 1.8*  --   --   CRP 1.7* 2.1* 3.9* 4.8*  --   --   --   PROCALCITON 7.59 5.00 3.29 2.06 1.38  --   --   MG 2.8* 2.7* 2.7* 2.3  --   --   --   PHOS 4.0 4.0 3.6 3.3  --  4.1 3.3  CALCIUM  8.5* 8.5* 8.8* 8.5* 8.6* 8.4* 8.5*      Recent Labs  Lab 04/02/24 0419 04/03/24 0357 04/04/24 0256 04/05/24 0259 04/06/24 0355 04/07/24 0224 04/08/24 0301  CRP 1.7* 2.1* 3.9* 4.8*  --   --   --   PROCALCITON 7.59 5.00 3.29 2.06 1.38  --   --   MG 2.8* 2.7* 2.7* 2.3  --   --   --   CALCIUM  8.5* 8.5* 8.8* 8.5* 8.6* 8.4* 8.5*    --------------------------------------------------------------------------------------------------------------- Lab Results  Component Value Date   CHOL 166 01/18/2024   HDL 15 (L) 01/18/2024   LDLCALC 127 (H) 01/18/2024   TRIG 169 (H) 03/28/2024   CHOLHDL 11.1 01/18/2024    Lab Results  Component Value Date   HGBA1C 6.7 (H) 03/27/2024      Micro Results No results found for this or  any previous visit (from the past 240 hours).   Radiology Report No results found.    Signature  -   Brayton Lye M.D on 04/08/2024 at 11:08 AM   -  To page go to [email or web address]

## 2024-04-08 NOTE — TOC Progression Note (Signed)
 Transition of Care [facility]) - Progression Note    Patient Details  Name: Fred Mcintosh MRN: [id number] Date of Birth: Jun 09, 1961  Transition of Care [facility]) CM/SW Contact  Inocente GORMAN Kindle, LCSW Phone Number: 04/08/2024, 1:36 PM  Clinical Narrative:    CSW continuing to follow.     Barriers to Discharge: ED Claims of Negligence on the Part of Facility/Family; SNF pending bed offer; insurance approval               Expected Discharge Plan and Services In-house Referral: Clinical Social Work     Living arrangements for the past 2 months: Single Family Home                                       Social Drivers of Health (SDOH) Interventions SDOH Screenings   Food Insecurity: No Food Insecurity (03/08/2024)  Housing: Low Risk  (03/08/2024)  Transportation Needs: No Transportation Needs (03/08/2024)  Utilities: Not At Risk (03/08/2024)  Social Connections: Moderately Integrated (01/18/2024)  Tobacco Use: Unknown (03/27/2024)    Readmission Risk Interventions     No data to display

## 2024-04-08 NOTE — Progress Notes (Signed)
 Nutrition Follow-up  DOCUMENTATION CODES:   Not applicable  INTERVENTION:  If within GOC and able to safely place, recommend nutrition support to aid in wound healing and preservation of lean body mass Discontinue Juven BID as patient is refusing Per MD, vitamin C  1000 mg x 30 days and 220mg  of zinc  x30 days  Discontinue Boost Breeze TID due to patient refusing Continue Ensure Plus High Protein po 5x daily, each supplement provides 350 kcal and 20 grams of protein  Continue Magic cup TID with meals, each supplement provides 290 kcal and 9 grams of protein  Discontinue 30 ml ProSource Plus BID due to patient refusing Continue appetite stimulant  NUTRITION DIAGNOSIS:  Increased nutrient needs related to wound healing as evidenced by estimated needs. - remains applicable  GOAL:  Patient will meet greater than or equal to 90% of their needs - not progressing  MONITOR:  Diet advancement, I & O's, Labs, Skin  REASON FOR ASSESSMENT:   Ventilator, Consult Enteral/tube feeding initiation and management  ASSESSMENT:   Pt with hx of HTN, DM type 2, grave's disease, and recent transmetatarsal amputation presented to ED with AMS and weakness. Family reports pt not getting out of bed x 9 days. In ED, left foot noted to be dehisced with exposed bone, foul drainage and subcutaneous emphysema up the shin.  9/10 - presented to ED, Op, left AKA with wound vac placement  9/11 - TF started; extubated 9/12 - Cortrak placed and TF re-initiated 9/14 - Echocardiogram w/ bubble study: Left ventricular  hypertrophy; EF 70-75%; aortic valve sclerosis/calcification 9/15 - THR assumed care 9/16 - TEE; patient pulled Cortrak 9/17 - calorie count started 9/19 - calorie count ended: insufficient; refused replacement of Cortrak; another calorie count initiated 9/21 - calorie count completed: insufficient; appetite stimulant up titrated  9/22 - MRI: no acute intracranial abnormality   Patient remains  confused, but lucid at times. MRI to be obtained. IV ABX continue. Patient pulled off wound vac over the weekend. 48 hour calorie count ordered and completed over the weekend. Results can be reviewed below.  Day 1: 9/20 Breakfast: 100% sausage patty, 25% applesauce, 100% grape juice, 100% milk (402 kcals, 13g protein) Lunch: 5% chicken w/ gravy, 5% mashed potatoes, 5% chocolate pudding (22 kcals, 2g protein) Dinner: 0% consumed, per chart (0 kcals, 0g protein) Supplements: none documented (0 kcals, 0g protein)  DAY 1 Total intake: 424 kcal (18% of minimum estimated needs)  15g protein (13% of minimum estimated needs)  Day 2: 9/21 Breakfast: 25% bacon, 100% banana, 100% orange juice, 100% milk (297 kcals, 10g protein) Lunch: 15% grilled cheese sandwich, 100% banana x2 (200 kcals, 4g protein) Dinner: no documentation received (0 kcals, 0g protein) Supplements: none documented (0 kcals, 0g protein)  DAY 2 Total intake: 497 kcal (21% of minimum estimated needs)  14g protein (12% of minimum estimated needs)  OVERALL AVERAGE: 20% of calorie needs, 13% of protein needs  Discussed findings with MD. RN reports he at better breakfast with 100% mango [place] Ice, 10% of his toast, 75% of his Cheerios, and 100% of his milk. He did not eat lunch as he was more sedated for his MRI. No documentation of supplements over the weekend, but RN reports two Ensures today. If he is averaging two Ensures per day with intake documented over the weekend, he is meeting closer to 50% of his estimated calorie and protein needs.  If within GOC and patient will allow placements, strongly recommend nutrition support until  mentation improves. This  would support wound healing and prevent catabolism and further loss of lean body mass.   Of note, he is with significant skin breakdown beyond his amputation site. Noted with stage 3 pressure injury to left buttocks and sacrum. He is at risk for further decline and skin  breakdown the longer he fails to meet his estimated calorie and protein needs.     Admit weight: 128.5 kg (first measured)  Current weight: 118.8 kg  +generalized non-pitting edema   Has lost 7.5% of his body weight since admission 12 days ago. This is considered clinically significant for the time frame. Likely somewhat related to fluid shifts and resolution of edema.  Edema has improved. Therefore, cannot likely use as a criteria for malnutrition even though it is likely he is appropriate. Large body habitus likely hiding more significant muscle and fat depletions.      Intake/Output Summary (Last 24 hours) at 04/08/2024 1804 Last data filed at 04/08/2024 1716 Gross per 24 hour  Intake 760 ml  Output 2500 ml  Net -1740 ml    Net IO Since Admission: 2,901.78 mL [04/08/24 1804]    Drains/Lines: Foley catheter UOP x 24 hours   Remains on vitamin/mineral regimen to promote wound healing. He is refusing Juven. Appetite stimulant started 9/19. MD increased dosage today. Insulin  regimin continues to be adjusted. Elevated blood sugars, however better controlled than on admission, which can also impact wound healing. Crt has stabilized and within desirable range. BUN remains elevated, however not significantly. Could be indicative of muscle breakdown and/or dehydration. WBC improved.    Meds: ascorbic acid , docusate, SS Novolog  0-15 TID, SS novolog  4 TID, Lantus  15 daily, levothyroxine , mirtazapine , pantoprazole , Miralax , zinc  sulfate  Drips: IV ABX  Labs:  Na+ 141 (wdl) K+ 5.1 (wdl) BUN 35 (H) PHOS 3.3 (wdl) WBC 8.5 (wdl) TSH 7.797 (9/10) T3, free 1.4 (9/11) CBGs 210-231 x24 hours A1c 6.7 (03/2024)   Diet Order:   Diet Order             Diet regular Room service appropriate? No; Fluid consistency: Thin  Diet effective now            EDUCATION NEEDS:   Not appropriate for education at this time  Skin:  Skin Assessment: Skin Integrity Issues: Skin Integrity Issues::  Stage III, Incisions Stage III: R buttocks/sacrum Incisions: closed surgical incision L AKA  Last BM:  9/21 - type 6 x1  Height:  Ht Readings from Last 1 Encounters:  03/27/24 6' 2 (1.88 m)   Weight:  Wt Readings from Last 1 Encounters:  04/08/24 118.8 kg   Ideal Body Weight:  77.7 kg (adjusted by 10% for AKA)  BMI:  Body mass index is 33.63 kg/m.  Estimated Nutritional Needs:   Kcal:  2400-2600 kcal/d  Protein:  120-145 g/d  Fluid:  2.5L/d  Blair Deaner MS, RD, LDN Registered Dietitian Clinical Nutrition RD Inpatient Contact Info in Amion

## 2024-04-09 DIAGNOSIS — M726 Necrotizing fasciitis: Secondary | ICD-10-CM | POA: Diagnosis not present

## 2024-04-09 LAB — GLUCOSE, CAPILLARY
Glucose-Capillary: 176 mg/dL — ABNORMAL HIGH (ref 70–99)
Glucose-Capillary: 176 mg/dL — ABNORMAL HIGH (ref 70–99)

## 2024-04-09 LAB — BASIC METABOLIC PANEL WITH GFR
Anion gap: 8 (ref 5–15)
BUN: 32 mg/dL — ABNORMAL HIGH (ref 8–23)
CO2: 23 mmol/L (ref 22–32)
Calcium: 8.5 mg/dL — ABNORMAL LOW (ref 8.9–10.3)
Chloride: 107 mmol/L (ref 98–111)
Creatinine, Ser: 1.2 mg/dL (ref 0.61–1.24)
GFR, Estimated: >60 mL/min
Glucose, Bld: 136 mg/dL — ABNORMAL HIGH (ref 70–99)
Potassium: 4.8 mmol/L (ref 3.5–5.1)
Sodium: 138 mmol/L (ref 135–145)

## 2024-04-09 LAB — CBC
HCT: 24.7 % — ABNORMAL LOW (ref 39.0–52.0)
Hemoglobin: 7.7 g/dL — ABNORMAL LOW (ref 13.0–17.0)
MCH: 28.5 pg (ref 26.0–34.0)
MCHC: 31.2 g/dL (ref 30.0–36.0)
MCV: 91.5 fL (ref 80.0–100.0)
Platelets: 175 K/uL (ref 150–400)
RBC: 2.7 MIL/uL — ABNORMAL LOW (ref 4.22–5.81)
RDW: 17.9 % — ABNORMAL HIGH (ref 11.5–15.5)
WBC: 7.4 K/uL (ref 4.0–10.5)
nRBC: 0 % (ref 0.0–0.2)

## 2024-04-09 LAB — PHOSPHORUS: Phosphorus: 3.3 mg/dL (ref 2.5–4.6)

## 2024-04-09 NOTE — Plan of Care (Signed)
 Encouraged pt to work hard with therapies to enhance strength and mobility. Pt verbalized understanding and is motivated .

## 2024-04-09 NOTE — Progress Notes (Signed)
 Physical Therapy Treatment Patient Details Name: Fred Mcintosh MRN: [id number] DOB: 01/30/1961 Today's Date: 04/09/2024   History of Present Illness 63 y.o. 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 Attempted to stand from elevated surface with max +2 assist and use of pad. RLE blocked with heavy assist for anterior translation and rise. Pt could clear sacrum but could not extend hip or fully rise. Maximove utilized to chair. No change in DC/DME recs at this time. PT will continue to follow.      If plan is discharge home, recommend the following: Two people to help with walking and/or transfers;Two people to help with bathing/dressing/bathroom;Direct supervision/assist for medications management;Assistance with feeding;Assistance with cooking/housework;Direct supervision/assist for financial management;Supervision due to cognitive status;Assist for transportation;Help with stairs or ramp for entrance   Can travel by private vehicle     No  Equipment Recommendations  Wheelchair (measurements PT);Wheelchair cushion (measurements PT);BSC/3in1;Hospital bed;Hoyer lift    Recommendations for Other Services       Precautions / Restrictions Precautions Precautions: Fall;Other (comment) Recall of Precautions/Restrictions: Impaired Precaution/Restrictions Comments: Lt AKA, sacral wound Restrictions Weight Bearing Restrictions Per Provider Order: Yes LLE Weight Bearing Per Provider Order: Non weight bearing     Mobility  Bed Mobility Overal bed mobility: Needs Assistance Bed Mobility: Rolling, Supine to Sit, Sit to Supine Rolling: Mod assist   Supine to sit: +2 for physical assistance, Mod assist, +2 for safety/equipment Sit to supine: Max assist, +2 for physical assistance    General bed mobility comments: mod assist to roll bil for pad placement. +2 Mod assist with HOB 25 degrees to pivot to EOB. max +2 to return to supine. Mod +2 assist to scoot EOB with pad with max multimodal cues.    Transfers Overall transfer level: Needs assistance Equipment used: Ambulation equipment used, Rolling walker (2 wheels) Transfers: Sit to/from Stand, Bed to chair/wheelchair/BSC Sit to Stand: Max assist, +2 physical assistance, +2 safety/equipment, From elevated surface           General transfer comment: from elevated surface able to initiate standing with max +2 assist and use of pad. RLE blocked with heavy assist for anterior translation and rise. Pt could clear sacrum but could not extend hip or fully rise. Maximove utilized to chair. Transfer via Lift Equipment: Maximove  Ambulation/Gait               General Gait Details: unable   Optometrist     Tilt Bed    Modified Rankin (Stroke Patients Only)       Balance Overall balance assessment: Needs assistance Sitting-balance support: Feet supported, No upper extremity supported Sitting balance-Leahy Scale: Fair Sitting balance - Comments: static sitting CGA   Standing balance support: Bilateral upper extremity supported, During functional activity, Reliant on assistive device for balance Standing balance-Leahy Scale: Poor Standing balance comment: Pt dependent on RW and +2 assist for safety.                            Communication Communication Communication: No apparent difficulties  Cognition Arousal: Alert Behavior During Therapy: Flat affect   PT - Cognitive impairments: No family/caregiver present to  determine baseline, Sequencing, Problem solving, Safety/Judgement, Awareness, Memory, Difficult to assess, Attention                       PT - Cognition Comments: pt A&Ox 4 this session, maintains poor insight, delayed command following  and limited awareness of function and progression Following commands: Impaired Following commands impaired: Follows one step commands inconsistently, Follows one step commands with increased time    Cueing Cueing Techniques: Verbal cues, Visual cues, Tactile cues, Gestural cues  Exercises      General Comments General comments (skin integrity, edema, etc.): VSS      Pertinent Vitals/Pain Pain Assessment Pain Assessment: No/denies pain    Home Living                          Prior Function            PT Goals (current goals can now be found in the care plan section) Progress towards PT goals: Progressing toward goals    Frequency    Min 2X/week      PT Plan      Co-evaluation              AM-PAC PT 6 Clicks Mobility   Outcome Measure  Help needed turning from your back to your side while in a flat bed without using bedrails?: A Lot Help needed moving from lying on your back to sitting on the side of a flat bed without using bedrails?: Total Help needed moving to and from a bed to a chair (including a wheelchair)?: Total Help needed standing up from a chair using your arms (e.g., wheelchair or bedside chair)?: Total Help needed to walk in hospital room?: Total Help needed climbing 3-5 steps with a railing? : Total 6 Click Score: 7    End of Session Equipment Utilized During Treatment: Gait belt Activity Tolerance: Patient tolerated treatment well Patient left: in chair;with call bell/phone within reach;with chair alarm set Nurse Communication: Mobility status;Need for lift equipment PT Visit Diagnosis: Difficulty in walking, not elsewhere classified (R26.2);Unsteadiness on feet (R26.81);Other abnormalities of gait and mobility (R26.89);Muscle weakness (generalized) (M62.81)     Time: 8544-8471 PT Time Calculation (min) (ACUTE ONLY): 33 min  Charges:    $Therapeutic Activity: 8-22 mins PT General Charges $$ ACUTE PT VISIT: 1 Visit                      Sueellen NOVAK, PT, DPT Acute Rehab Services [phone]    Geremy Rister 04/09/2024, 4:48 PM

## 2024-04-09 NOTE — Progress Notes (Signed)
 PROGRESS NOTE                                                                                                                                                                                                             Patient Demographics:    Fred Mcintosh, is a 63 y.o. male, DOB - Nov 27, 1960, FMW:[id number]  Outpatient Primary MD for the patient is Fred Lung, MD    LOS - 13  Admit date - 03/27/2024    Chief Complaint  Patient presents with   Altered Mental Status       Brief Narrative (HPI from H&P)     63 year old male with past medical history of diabetes, hypertension, asthma, hyperthyroidism/Graves', recent left foot amputation who presented to the emergency department on 9/10 AM with complaint of altered mental status. Per EDP, family called 911 after patient reportedly laying in bed for 9 days. With EMS, blood glucose out of range, noted coffee-ground emesis and dark tarry stools. On arrival to ED, tachycardic to 130s, lethargic. Left foot noted to be dehisced with exposed bone, foul drainage and subcutaneous emphysema up the shin. Labs notable for lactic acid 5.4, Na 128, K 5.9, Cl 89, bicarb 16, BG 816, BUN 101, sCr 2.57, alk phos 127, Tbili 3.3, AG 23, WBC 31.4, Hgb 9, plt 440, INR 1.5, BHB 3.71, UA with trace leukocytes. Blood cultures were drawn and pending. CXR with no acute abnormality. Left foot xray with air around the stump and lower half of leg. Obvious concern for necrotizing fasciitis with septic shock, HHS.   He was given 3L LR fluid resuscitation, started on Zyvox  and Zosyn , insulin  drip. Requiring low dose levophed  despite fluid resuscitation. Orthopedic surgery consulted. Dr. Harden to take him to OR later today. 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 Harden, seen by ID, PCCM and orthopedics stabilized and transferred to hospitalist  service under my care on 04/01/2024.     Significant Hospital Events: Including procedures, antibiotic start and stop dates in addition to other pertinent events           9/10: ED for septic shock vs left foot wound and nec fasc, HHS. Ortho to take to OR, CCM to admit ICU  9/11 POD#1 s/p L AKA, wound vac in place.  Extubated     Subjective:   Appetite remains poor, he denies any complaints today  Assessment  & Plan :   Sepsis 2/2 necrotizing fasciitis of acute on chronic left lower extremity wound, streptococcal bacteremia  - he was admitted to ICU by PCCM, underwent  L AKA 03/27/24 by Dr. Harden, has left AKA with a wound VAC, seen by ID as well, biotics management per ID.  - Echocardiogram shows questionable growth on tricuspid valve, but TEE negative for endocarditis - Discussed with ID, recommendation for 2 weeks antibiotic from negative blood cultures, which will be 14 days from 03/29/2024 , to remain on IV penicillin  during hospital stay, and can transition to cefadroxil 1 g twice daily upon discharge   Acute Metabolic encephalopathy - due to #1 above, no headache or focal deficits, treat sepsis. - CT head with no acute findings - MRI brain 9/22 with no acute findings - Mentation continues to improve, he is awake, alert x 3 today, close to baseline  Dehydration, hypernatremia and acute kidney injury - due to sepsis hydrate and monitor.  - Passed bedside swallow evaluation, encouraged to increase his fluid intake.  He remains with very poor appetite though. - Remains with very poor appetite, nutritionist consulted, given his recent surgery, infection, and very low albumin they have discussed with him tube feeding, but he declined for now, but he reports he will crease his supplements intake and oral intake, he is currently on calorie count, followed by dietitian closely . - Started on low-dose Remeron  to encourage his appetite, uptitrate gradually, continue on discharge given very  poor appetite, but he drinks his ensures, so I we will increase his Ensure even up to 5 times daily, and did add boost as well - Hypernatremia resolved, encouraged to increase fluid intake    dysphagia.   - Passed swallow evaluation, advanced to regular diet  Elevated Tbili- f/u LFTs this am  Hypothyroidism- continue PTA synthroid , needs OP f/u  History of hypertension no acute issues continue to monitor  History of asthma no wheezing, supportive care.  Anemia - Hemoglobin at baseline, required as needed transfusions, received 2 units during hospital stay  Hx of infrequent medical care- complicating control of above  Physical deconditioning.  PT OT, may require placement   DM2 with hyperglycemia - Continue with insulin  sliding scale and Lantus , CBG remains controlled on 15 units.    Lab Results  Component Value Date   HGBA1C 6.7 (H) 03/27/2024    CBG (last 3)  Recent Labs    04/08/24 1614 04/08/24 2127 04/09/24 0833  GLUCAP 123* 176* 176*         Condition - Extremely Guarded  Family Communication  : None present, discussed with brother Fred Mcintosh by phone 9/22  Code Status : Full code  Consults  : PCCM, orthopedics, ID  PUD Prophylaxis :     Procedures  :            Disposition Plan  :    Status is: Inpatient   DVT Prophylaxis  :    SCD's Start: 03/27/24 1747 heparin  injection 5,000 Units Start: 03/27/24 1400    Lab Results  Component Value Date   PLT 175 04/09/2024    Diet :  Diet Order             Diet regular Room service appropriate? No; Fluid consistency: Thin  Diet effective now  Inpatient Medications  Scheduled Meds:  sodium chloride    Intravenous Once   acetaminophen   650 mg Oral Q6H WA   vitamin C   1,000 mg Oral Daily   Chlorhexidine  Gluconate Cloth  6 each Topical Daily   docusate sodium   100 mg Oral BID   feeding supplement  237 mL Oral 5 X Daily   heparin   5,000 Units Subcutaneous Q8H    insulin  aspart  0-15 Units Subcutaneous TID WC   insulin  aspart  0-5 Units Subcutaneous QHS   insulin  aspart  4 Units Subcutaneous TID WC   insulin  glargine  15 Units Subcutaneous Q24H   leptospermum manuka honey  1 Application Topical Daily   levothyroxine   50 mcg Oral Q0600   mirtazapine   30 mg Oral QHS   pantoprazole  (PROTONIX ) IV  40 mg Intravenous Q24H   polyethylene glycol  17 g Oral Daily   zinc  sulfate (50mg  elemental zinc )  220 mg Oral Daily   Continuous Infusions:  penicillin  G potassium 24 Million Units in dextrose  5 % 500 mL CONTINUOUS infusion 24 Million Units (04/09/24 1352)   PRN Meds:.albuterol , alum & mag hydroxide-simeth, dextrose , hydrALAZINE , labetalol , methocarbamol  (ROBAXIN ) injection, ondansetron  (ZOFRAN ) IV, mouth rinse, oxyCODONE     Objective:   Vitals:   04/09/24 0500 04/09/24 0513 04/09/24 0833 04/09/24 1149  BP:  114/76    Pulse:  99    Resp:  20    Temp:  (!) 97.5 F (36.4 C)    TempSrc:  Axillary Oral Oral  SpO2:  95%    Weight: 118.4 kg     Height:        Wt Readings from Last 3 Encounters:  04/09/24 118.4 kg  03/08/24 (!) 141.5 kg  01/18/24 (!) 153.3 kg     Intake/Output Summary (Last 24 hours) at 04/09/2024 1359 Last data filed at 04/09/2024 1041 Gross per 24 hour  Intake 904.05 ml  Output 1650 ml  Net -745.95 ml     Physical Exam  Awake Alert, Oriented X 3,  Symmetrical Chest wall movement, Good air movement bilaterally, CTAB RRR,No Gallops,Rubs or new Murmurs, No Parasternal Heave +ve B.Sounds, Abd Soft, No tenderness, No rebound - guarding or rigidity. No Cyanosis,  left AKA.      Data Review:    Recent Labs  Lab 04/03/24 0357 04/04/24 0256 04/05/24 0259 04/06/24 0355 04/07/24 0224 04/08/24 0301 04/09/24 0242  WBC 10.4 10.0 10.0 9.7 7.8 8.5 7.4  HGB 9.0* 9.6* 8.2* 8.6* 8.1* 8.1* 7.7*  HCT 29.8* 32.2* 26.9* 28.0* 26.8* 26.2* 24.7*  PLT 151 185 187 196 184 192 175  MCV 95.2 95.3 93.1 93.6 94.7 92.6 91.5  MCH  28.8 28.4 28.4 28.8 28.6 28.6 28.5  MCHC 30.2 29.8* 30.5 30.7 30.2 30.9 31.2  RDW 18.1* 18.4* 18.1* 18.4* 18.2* 17.8* 17.9*  LYMPHSABS 1.2 1.2 1.6 1.8  --   --   --   MONOABS 0.4 0.4 0.4 0.3  --   --   --   EOSABS 0.1 0.1 0.1 0.1  --   --   --   BASOSABS 0.0 0.0 0.0 0.0  --   --   --     Recent Labs  Lab 04/03/24 0357 04/04/24 0256 04/05/24 0259 04/06/24 0355 04/07/24 0224 04/08/24 0301 04/09/24 0242  NA 150* 149* 148* 145 143 141 138  K 5.1 4.6 4.8 4.8 5.1 5.1 4.8  CL 118* 113* 112* 113* 115* 109 107  CO2 27 25 23  21* 22 24 23  ANIONGAP 5 11 13 11 6 8 8   GLUCOSE 205* 213* 205* 131* 231* 210* 136*  BUN 61* 51* 46* 35* 34* 35* 32*  CREATININE 1.47* 1.49* 1.23 1.29* 1.39* 1.11 1.20  AST 17 18 16 15   --   --   --   ALT 12 14 12 12   --   --   --   ALKPHOS 66 81 71 68  --   --   --   BILITOT 0.7 1.1 0.8 0.9  --   --   --   ALBUMIN 1.6* 1.9* 1.7* 1.8*  --   --   --   CRP 2.1* 3.9* 4.8*  --   --   --   --   PROCALCITON 5.00 3.29 2.06 1.38  --   --   --   MG 2.7* 2.7* 2.3  --   --   --   --   PHOS 4.0 3.6 3.3  --  4.1 3.3 3.3  CALCIUM  8.5* 8.8* 8.5* 8.6* 8.4* 8.5* 8.5*      Recent Labs  Lab 04/03/24 0357 04/04/24 0256 04/05/24 0259 04/06/24 0355 04/07/24 0224 04/08/24 0301 04/09/24 0242  CRP 2.1* 3.9* 4.8*  --   --   --   --   PROCALCITON 5.00 3.29 2.06 1.38  --   --   --   MG 2.7* 2.7* 2.3  --   --   --   --   CALCIUM  8.5* 8.8* 8.5* 8.6* 8.4* 8.5* 8.5*    --------------------------------------------------------------------------------------------------------------- Lab Results  Component Value Date   CHOL 166 01/18/2024   HDL 15 (L) 01/18/2024   LDLCALC 127 (H) 01/18/2024   TRIG 169 (H) 03/28/2024   CHOLHDL 11.1 01/18/2024    Lab Results  Component Value Date   HGBA1C 6.7 (H) 03/27/2024      Micro Results No results found for this or any previous visit (from the past 240 hours).   Radiology Report MR BRAIN WO CONTRAST Result Date:  04/08/2024 CLINICAL DATA:  63 year old male with recent altered mental status, persistent confusion. EXAM: MRI HEAD WITHOUT CONTRAST TECHNIQUE: Multiplanar, multiecho pulse sequences of the brain and surrounding structures were obtained without intravenous contrast. COMPARISON:  Head CT 04/03/2024. FINDINGS: Study is mildly degraded by motion artifact despite repeated imaging attempts. Brain: No restricted diffusion to suggest acute infarction. No midline shift, mass effect, evidence of mass lesion, ventriculomegaly, extra-axial collection or acute intracranial hemorrhage. Cervicomedullary junction and pituitary are within normal limits. Patchy periventricular, scattered cerebral white matter T2 and FLAIR hyperintensity, extent appears mild for age when allowing for motion artifact. No cortical encephalomalacia identified. No chronic cerebral blood products identified on susceptibility imaging. Deep gray nuclei, brainstem, and cerebellum appear negative. Vascular: Major intracranial vascular flow voids are preserved. Skull and upper cervical spine: Negative. Visualized bone marrow signal is within normal limits. Sinuses/Orbits: Negative. Other: Trace right mastoid effusion.  Negative visible nasopharynx. IMPRESSION: Mildly motion degraded exam with no acute intracranial abnormality. Electronically Signed   By: VEAR Hurst M.D.   On: 04/08/2024 13:09      Signature  -   Brayton Lye M.D on 04/09/2024 at 1:59 PM   -  To page go to [email or web address]

## 2024-04-09 NOTE — TOC Progression Note (Addendum)
 Transition of Care [facility]) - Progression Note    Patient Details  Name: Fred Mcintosh MRN: [id number] Date of Birth: August 03, 1960  Transition of Care [facility]) CM/SW Contact  Inocente GORMAN Kindle, LCSW Phone Number: 04/09/2024, 10:56 AM  Clinical Narrative:    10:56am-CSW requested [facility] evaluate patient.   4:31 PM-[facility] liaison stating they need a copy of patient's [employer] card as he has a workers Animal nutritionist. Patient stated he does not have anything in the room with him. CSW will see if his son can obtain it.     Barriers to Discharge: ED Claims of Negligence on the Part of Facility/Family; SNF pending bed offer, insurance approval               Expected Discharge Plan and Services In-house Referral: Clinical Social Work     Living arrangements for the past 2 months: Single Family Home                                       Social Drivers of Health (SDOH) Interventions SDOH Screenings   Food Insecurity: No Food Insecurity (03/08/2024)  Housing: Low Risk  (03/08/2024)  Transportation Needs: No Transportation Needs (03/08/2024)  Utilities: Not At Risk (03/08/2024)  Social Connections: Moderately Integrated (01/18/2024)  Tobacco Use: Unknown (03/27/2024)    Readmission Risk Interventions     No data to display

## 2024-04-09 NOTE — Progress Notes (Signed)
 Speech Language Pathology Treatment: Dysphagia  Patient Details Name: ODYN TURKO MRN: [id number] DOB: 1961/02/23 Today's Date: 04/09/2024 Time: 8592-8584 SLP Time Calculation (min) (ACUTE ONLY): 8 min  Assessment / Plan / Recommendation Clinical Impression  Pt fed himself regular solids and thin liquids without overt s/s of dysphagia or aspiration. He demonstrates improved pacing and acknowledges recommended precautions. Ongoing management of his diet and intake can be managed by RD and MD. Continue current diet without ongoing SLP f/u. Signing off acutely.    HPI HPI: 63 yo male admitted 9/10 with AMS and weakness after not getting OOB for 9 days. 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 ETT 9/10-9/11. PMH includes: recent L foot amputation, DM, HTN, asthma, Graves disease      SLP Plan  All goals met          Recommendations  Diet recommendations: Regular;Thin liquid Liquids provided via: Cup;Straw Medication Administration: Whole meds with liquid Supervision: Staff to assist with self feeding;Intermittent supervision to cue for compensatory strategies Compensations: Slow rate;Small sips/bites Postural Changes and/or Swallow Maneuvers: Seated upright 90 degrees                  Oral care BID   PRN Dysphagia, oropharyngeal phase (R13.12)     All goals met     Damien Blumenthal, M.A., CCC-SLP Speech Language Pathology, Acute Rehabilitation Services  Secure Chat preferred [phone]   04/09/2024, 2:18 PM

## 2024-04-10 DIAGNOSIS — R7881 Bacteremia: Secondary | ICD-10-CM | POA: Diagnosis not present

## 2024-04-10 DIAGNOSIS — E1100 Type 2 diabetes mellitus with hyperosmolarity without nonketotic hyperglycemic-hyperosmolar coma (NKHHC): Secondary | ICD-10-CM | POA: Diagnosis not present

## 2024-04-10 DIAGNOSIS — M726 Necrotizing fasciitis: Secondary | ICD-10-CM | POA: Diagnosis not present

## 2024-04-10 DIAGNOSIS — R6521 Severe sepsis with septic shock: Secondary | ICD-10-CM | POA: Diagnosis not present

## 2024-04-10 LAB — GLUCOSE, CAPILLARY
Glucose-Capillary: 168 mg/dL — ABNORMAL HIGH (ref 70–99)
Glucose-Capillary: 149 mg/dL — ABNORMAL HIGH (ref 70–99)
Glucose-Capillary: 160 mg/dL — ABNORMAL HIGH (ref 70–99)
Glucose-Capillary: 150 mg/dL — ABNORMAL HIGH (ref 70–99)
Glucose-Capillary: 199 mg/dL — ABNORMAL HIGH (ref 70–99)

## 2024-04-10 MED ORDER — PANTOPRAZOLE SODIUM 40 MG PO TBEC
40.0000 mg | DELAYED_RELEASE_TABLET | Freq: Every day | ORAL | Status: DC
  Administered 2024-04-10 – 2024-05-01 (×22): 40 mg via ORAL
  Filled 2024-04-10 (×22): qty 1

## 2024-04-10 MED ORDER — METOCLOPRAMIDE HCL 5 MG/ML IJ SOLN
10.0000 mg | Freq: Three times a day (TID) | INTRAMUSCULAR | Status: DC
  Administered 2024-04-11 – 2024-04-12 (×6): 10 mg via INTRAVENOUS
  Filled 2024-04-10 (×7): qty 2

## 2024-04-10 NOTE — Progress Notes (Signed)
 Occupational Therapy Treatment Patient Details Name: Fred Mcintosh MRN: [id number] DOB: [birth year] Today's Date: 04/10/2024   History of present illness 63 y.o. 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 PMHx: Lt TMA 03/08/24, T2DM, HTN, obesity, Graves' disease   OT comments  Pt requiring encouragement to work on ADLs this visit. Unable to identify goals he would like to work toward. Tearful and negative about world events. Eventually, repositioned himself in bed, changed soiled gown with min assist and completed grooming at bed level with set up. Pt is oriented to situation and plan for post acute rehab. Goals updated to reflect progress. Patient will benefit from continued inpatient follow up therapy, <3 hours/day.      If plan is discharge home, recommend the following:  Two people to help with walking and/or transfers;Two people to help with bathing/dressing/bathroom;Assistance with cooking/housework;Assistance with feeding;Direct supervision/assist for medications management;Direct supervision/assist for financial management;Assist for transportation;Help with stairs or ramp for entrance   Equipment Recommendations  Other (comment) (defer)    Recommendations for Other Services      Precautions / Restrictions Precautions Precautions: Fall Precaution/Restrictions Comments: Lt AKA, sacral wound Restrictions Weight Bearing Restrictions Per Provider Order: Yes LLE Weight Bearing Per Provider Order: Non weight bearing       Mobility Bed Mobility Overal bed mobility: Needs Assistance             General bed mobility comments: pulled himself up using R LE and B UEs on headboard with bed in trendelenburg with encouragement, declined rolling onto one side to relieve pressure on backside    Transfers                   General transfer comment: declined     Balance                                            ADL either performed or assessed with clinical judgement   ADL Overall ADL's : Needs assistance/impaired Eating/Feeding: Bed level;Independent   Grooming: Oral care;Bed level;Set up           Upper Body Dressing : Minimal assistance;Bed level                          Extremity/Trunk Assessment              Vision       Perception     Praxis     Communication Communication Communication: No apparent difficulties   Cognition Arousal: Alert Behavior During Therapy: Flat affect Cognition: Cognition impaired     Awareness: Intellectual awareness intact, Online awareness impaired Memory impairment (select all impairments): Short-term memory Attention impairment (select first level of impairment): Selective attention Executive functioning impairment (select all impairments): Initiation, Problem solving OT - Cognition Comments: pt initially stating he did not have any goals he wants therapy to help him work toward, encouragement needed to change his hospital gown with vomit on it, brush his teeth at bed level and to self assist pulling up in bed in trendelenberg                 Following commands: Intact        Cueing      Exercises  Shoulder Instructions       General Comments      Pertinent Vitals/ Pain       Pain Assessment Pain Assessment: Faces Faces Pain Scale: No hurt  Home Living                                          Prior Functioning/Environment              Frequency  Min 2X/week        Progress Toward Goals  OT Goals(current goals can now be found in the care plan section)  Progress towards OT goals: Progressing toward goals;Goals updated  Acute Rehab OT Goals OT Goal Formulation: Patient unable to participate in goal setting Time For Goal Achievement: 04/24/24 Potential to Achieve Goals: Fair  Plan       Co-evaluation                 AM-PAC OT 6 Clicks Daily Activity     Outcome Measure   Help from another person eating meals?: None Help from another person taking care of personal grooming?: A Little Help from another person toileting, which includes using toliet, bedpan, or urinal?: Total Help from another person bathing (including washing, rinsing, drying)?: A Lot Help from another person to put on and taking off regular upper body clothing?: A Little Help from another person to put on and taking off regular lower body clothing?: Total 6 Click Score: 14    End of Session    OT Visit Diagnosis: Muscle weakness (generalized) (M62.81);Other symptoms and signs involving cognitive function   Activity Tolerance Patient tolerated treatment well   Patient Left in bed;with call bell/phone within reach;with bed alarm set   Nurse Communication          Time: 8495-8476 OT Time Calculation (min): 19 min  Charges: OT General Charges $OT Visit: 1 Visit OT Treatments $Self Care/Home Management : 8-22 mins  Mliss HERO, OTR/L Acute Rehabilitation Services Office: [phone]   Fred Mcintosh 04/10/2024, 3:50 PM

## 2024-04-10 NOTE — Plan of Care (Signed)
  Problem: Clinical Measurements: Goal: Will remain free from infection Outcome: Progressing   Problem: Coping: Goal: Level of anxiety will decrease Outcome: Progressing   

## 2024-04-10 NOTE — TOC Progression Note (Signed)
 Transition of Care [facility]) - Progression Note    Patient Details  Name: Fred Mcintosh MRN: [id number] Date of Birth: 12-04-1960  Transition of Care [facility]) CM/SW Contact  Inocente GORMAN Kindle, LCSW Phone Number: 04/10/2024, 10:11 AM  Clinical Narrative:    CSW left voicemail for patient's brother, [name], requesting help with providing a copy of patient's insurance card as SNF is requesting.     Expected Discharge Plan: Skilled Nursing Facility Barriers to Discharge: Insurance Authorization, SNF Pending bed offer, Continued Medical Work up               Expected Discharge Plan and Services In-house Referral: Clinical Social Work   Post Acute Care Choice: Skilled Nursing Facility Living arrangements for the past 2 months: Single Family Home                                       Social Drivers of Health (SDOH) Interventions SDOH Screenings   Food Insecurity: No Food Insecurity (03/08/2024)  Housing: Low Risk  (03/08/2024)  Transportation Needs: No Transportation Needs (03/08/2024)  Utilities: Not At Risk (03/08/2024)  Social Connections: Moderately Integrated (01/18/2024)  Tobacco Use: Unknown (03/27/2024)    Readmission Risk Interventions     No data to display

## 2024-04-10 NOTE — Progress Notes (Addendum)
 PROGRESS NOTE        PATIENT DETAILS Name: Fred Mcintosh Age: 63 y.o. Sex: male Date of Birth: [birth year] Admit Date: 03/27/2024 Admitting Physician Valinda Novas, MD ERE:Ypoo, Elna, MD  Brief Summary: Patient is a 63 y.o.  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  Admitted to the ICU-orthopedic consulted-underwent left AKA-subsequently extubated-stabilized and transferred to [facility].  Significant events: 9/10>> septic shock-admit to ICU-underwent left AKA 9/11>> extubated. 9/15>> transferred to [facility].  Significant studies: 9/10>> CXR: No active disease. 9/10>> left foot x-ray: There is surrounding TMA stump-as well as lower calf. 9/10>> x-ray left tibia/fibula: Surrounding TMA stump-and lower half of leg. 9/14>> TTE: EF 70-75%, grade 1 diastolic dysfunction 916>> TEE: No vegetation. 9/17>> CT head: No acute abnormality 9/22>> MRI brain: No acute intracranial abnormality.  Significant microbiology data: 9/10>> blood culture: Streptococcus anginosus. 9/12>> blood culture: No growth  Procedures: 9/10>> left AKA 9/16>> TEE  Consults: Orthopedic PCCM Infectious disease  Subjective: Vomited a few times last night.  Objective: Vitals: Blood pressure 120/74, pulse 99, temperature 98.7 F (37.1 C), resp. rate 11, height 6' 2 (1.88 m), weight 119.8 kg, SpO2 97%.   Exam: Gen Exam:Alert awake-not in any distress HEENT:atraumatic, normocephalic Chest: B/L clear to auscultation anteriorly CVS:S1S2 regular Abdomen:soft non tender, non distended Extremities:no edema-left AKA Neurology: Non focal Skin: no rash  Pertinent Labs/Radiology:    Latest Ref Rng & Units 04/09/2024    2:42 AM 04/08/2024    3:01 AM 04/07/2024    2:24 AM  CBC  WBC 4.0 - 10.5 K/uL 7.4  8.5  7.8   Hemoglobin 13.0 - 17.0 g/dL 7.7   8.1  8.1   Hematocrit 39.0 - 52.0 % 24.7  26.2  26.8   Platelets 150 - 400 K/uL 175  192  184     Lab Results  Component Value Date   NA 138 04/09/2024   K 4.8 04/09/2024   CL 107 04/09/2024   CO2 23 04/09/2024      Assessment/Plan: Septic shock secondary to left lower extremity necrotizing fasciitis and streptococcal bacteremia Sepsis physiology has resolved S/p left AKA by Dr. Harden on 9/10 TEE negative for endocarditis On IV penicillin  G  Acute metabolic encephalopathy Secondary to sepsis physiology Neuroimaging negative Improved-awake alert this morning  Vomiting Vomited overnight Abdominal exam is benign Last BM apparently yesterday having passing flatus this morning Will schedule IV Reglan  for today and see how he does If vomiting persist-May need imaging studies.  Normocytic anemia  Likely secondary to critical/acute illness Follow CBC.  Hypothyroidism Synthroid   HTN BP stable Currently not on any antihypertensives  DM-2 (A1c 6.7 on 9/10) CBG stable Lantus  15 units daily+ 4 units 3 times daily with meals+ SSI  Recent Labs    04/09/24 1628 04/09/24 2113 04/10/24 0813  GLUCAP 160* 150* 199*     Bronchial asthma Stable/no wheezing As needed bronchodilators.  Nutrition Status: Nutrition Problem: Increased nutrient needs Etiology: wound healing Signs/Symptoms: estimated needs Interventions: MVI, Juven, Tube feeding    Pressure Ulcer: Wound 03/27/24 Pressure Injury Buttocks Left Stage 3 -  Full thickness tissue loss. Subcutaneous fat may be visible but bone, tendon or muscle are NOT exposed. (Active)     Wound 03/27/24 Pressure Injury Sacrum Stage  3 -  Full thickness tissue loss. Subcutaneous fat may be visible but bone, tendon or muscle are NOT exposed. (Active)    Class I obesity Estimated body mass index is 33.91 kg/m as calculated from the following:   Height as of this encounter: 6' 2 (1.88 m).   Weight as of this encounter: 119.8 kg.    Code status:   Code Status: Full Code   DVT Prophylaxis: SCD's Start: 03/27/24 1747 heparin  injection 5,000 Units Start: 03/27/24 1400   Family Communication: None at bedside   Disposition Plan: Status is: Inpatient Remains inpatient appropriate because: Severity of illness   Planned Discharge Destination:Skilled nursing facility   Diet: Diet Order             Diet regular Room service appropriate? No; Fluid consistency: Thin  Diet effective now                     Antimicrobial agents: Anti-infectives (From admission, onward)    Start     Dose/Rate Route Frequency Ordered Stop   04/03/24 1200  penicillin  G potassium 24 Million Units in dextrose  5 % 500 mL CONTINUOUS infusion        24 Million Units 20.8 mL/hr over 24 Hours Intravenous Every 24 hours 04/03/24 1013 04/13/24 0959   03/29/24 1200  cefTRIAXone  (ROCEPHIN ) 2 g in sodium chloride  0.9 % 100 mL IVPB  Status:  Discontinued        2 g 200 mL/hr over 30 Minutes Intravenous Every 24 hours 03/29/24 1037 04/03/24 1012   03/27/24 1400  piperacillin -tazobactam (ZOSYN ) IVPB 3.375 g  Status:  Discontinued        3.375 g 12.5 mL/hr over 240 Minutes Intravenous Every 8 hours 03/27/24 1208 03/29/24 1037   03/27/24 1300  linezolid  (ZYVOX ) IVPB 600 mg  Status:  Discontinued        600 mg 300 mL/hr over 60 Minutes Intravenous Every 12 hours 03/27/24 1208 03/29/24 1037   03/27/24 1215  ceFAZolin  (ANCEF ) IVPB 3g/150 mL premix  Status:  Discontinued        3 g 300 mL/hr over 30 Minutes Intravenous On call to O.R. 03/27/24 1206 03/27/24 1743   03/27/24 0900  linezolid  (ZYVOX ) IVPB 600 mg        600 mg 300 mL/hr over 60 Minutes Intravenous  Once 03/27/24 0837 03/27/24 1057   03/27/24 0845  piperacillin -tazobactam (ZOSYN ) IVPB 3.375 g        3.375 g 100 mL/hr over 30 Minutes Intravenous  Once 03/27/24 0837 03/27/24 0958        MEDICATIONS: Scheduled Meds:  sodium chloride    Intravenous Once   acetaminophen   650  mg Oral Q6H WA   vitamin C   1,000 mg Oral Daily   Chlorhexidine  Gluconate Cloth  6 each Topical Daily   docusate sodium   100 mg Oral BID   feeding supplement  237 mL Oral 5 X Daily   heparin   5,000 Units Subcutaneous Q8H   insulin  aspart  0-15 Units Subcutaneous TID WC   insulin  aspart  0-5 Units Subcutaneous QHS   insulin  aspart  4 Units Subcutaneous TID WC   insulin  glargine  15 Units Subcutaneous Q24H   leptospermum manuka honey  1 Application Topical Daily   levothyroxine   50 mcg Oral Q0600   mirtazapine   30 mg Oral QHS   pantoprazole   40 mg Oral Daily   polyethylene glycol  17 g Oral Daily   zinc  sulfate (50mg  elemental  zinc )  220 mg Oral Daily   Continuous Infusions:  penicillin  G potassium 24 Million Units in dextrose  5 % 500 mL CONTINUOUS infusion 24 Million Units (04/09/24 1352)   PRN Meds:.albuterol , alum & mag hydroxide-simeth, dextrose , hydrALAZINE , labetalol , methocarbamol  (ROBAXIN ) injection, ondansetron  (ZOFRAN ) IV, mouth rinse, oxyCODONE    I have personally reviewed following labs and imaging studies  LABORATORY DATA: CBC: Recent Labs  Lab 04/04/24 0256 04/05/24 0259 04/06/24 0355 04/07/24 0224 04/08/24 0301 04/09/24 0242  WBC 10.0 10.0 9.7 7.8 8.5 7.4  NEUTROABS 8.2* 7.7 7.4  --   --   --   HGB 9.6* 8.2* 8.6* 8.1* 8.1* 7.7*  HCT 32.2* 26.9* 28.0* 26.8* 26.2* 24.7*  MCV 95.3 93.1 93.6 94.7 92.6 91.5  PLT 185 187 196 184 192 175    Basic Metabolic Panel: Recent Labs  Lab 04/04/24 0256 04/05/24 0259 04/06/24 0355 04/07/24 0224 04/08/24 0301 04/09/24 0242  NA 149* 148* 145 143 141 138  K 4.6 4.8 4.8 5.1 5.1 4.8  CL 113* 112* 113* 115* 109 107  CO2 25 23 21* 22 24 23   GLUCOSE 213* 205* 131* 231* 210* 136*  BUN 51* 46* 35* 34* 35* 32*  CREATININE 1.49* 1.23 1.29* 1.39* 1.11 1.20  CALCIUM  8.8* 8.5* 8.6* 8.4* 8.5* 8.5*  MG 2.7* 2.3  --   --   --   --   PHOS 3.6 3.3  --  4.1 3.3 3.3    GFR: Estimated Creatinine Clearance: 87.8 mL/min (by C-G  formula based on SCr of 1.2 mg/dL).  Liver Function Tests: Recent Labs  Lab 04/04/24 0256 04/05/24 0259 04/06/24 0355  AST 18 16 15   ALT 14 12 12   ALKPHOS 81 71 68  BILITOT 1.1 0.8 0.9  PROT 6.7 6.3* 6.4*  ALBUMIN 1.9* 1.7* 1.8*   No results for input(s): LIPASE, AMYLASE in the last 168 hours. No results for input(s): AMMONIA in the last 168 hours.  Coagulation Profile: No results for input(s): INR, PROTIME in the last 168 hours.  Cardiac Enzymes: No results for input(s): CKTOTAL, CKMB, CKMBINDEX, TROPONINI in the last 168 hours.  BNP (last 3 results) No results for input(s): PROBNP in the last 8760 hours.  Lipid Profile: No results for input(s): CHOL, HDL, LDLCALC, TRIG, CHOLHDL, LDLDIRECT in the last 72 hours.  Thyroid  Function Tests: No results for input(s): TSH, T4TOTAL, FREET4, T3FREE, THYROIDAB in the last 72 hours.  Anemia Panel: Recent Labs    04/08/24 0727  VITAMINB12 1,335*    Urine analysis:    Component Value Date/Time   COLORURINE AMBER (A) 03/27/2024 0934   APPEARANCEUR HAZY (A) 03/27/2024 0934   LABSPEC 1.016 03/27/2024 0934   PHURINE 5.0 03/27/2024 0934   GLUCOSEU >=500 (A) 03/27/2024 0934   HGBUR LARGE (A) 03/27/2024 0934   BILIRUBINUR NEGATIVE 03/27/2024 0934   KETONESUR NEGATIVE 03/27/2024 0934   PROTEINUR NEGATIVE 03/27/2024 0934   NITRITE NEGATIVE 03/27/2024 0934   LEUKOCYTESUR TRACE (A) 03/27/2024 0934    Sepsis Labs: Lactic Acid, Venous    Component Value Date/Time   LATICACIDVEN 7.6 (HH) 03/27/2024 1529    MICROBIOLOGY: No results found for this or any previous visit (from the past 240 hours).  RADIOLOGY STUDIES/RESULTS: MR BRAIN WO CONTRAST Result Date: 04/08/2024 CLINICAL DATA:  63 year old male with recent altered mental status, persistent confusion. EXAM: MRI HEAD WITHOUT CONTRAST TECHNIQUE: Multiplanar, multiecho pulse sequences of the brain and surrounding structures were  obtained without intravenous contrast. COMPARISON:  Head CT 04/03/2024. FINDINGS: Study is  mildly degraded by motion artifact despite repeated imaging attempts. Brain: No restricted diffusion to suggest acute infarction. No midline shift, mass effect, evidence of mass lesion, ventriculomegaly, extra-axial collection or acute intracranial hemorrhage. Cervicomedullary junction and pituitary are within normal limits. Patchy periventricular, scattered cerebral white matter T2 and FLAIR hyperintensity, extent appears mild for age when allowing for motion artifact. No cortical encephalomalacia identified. No chronic cerebral blood products identified on susceptibility imaging. Deep gray nuclei, brainstem, and cerebellum appear negative. Vascular: Major intracranial vascular flow voids are preserved. Skull and upper cervical spine: Negative. Visualized bone marrow signal is within normal limits. Sinuses/Orbits: Negative. Other: Trace right mastoid effusion.  Negative visible nasopharynx. IMPRESSION: Mildly motion degraded exam with no acute intracranial abnormality. Electronically Signed   By: VEAR Hurst M.D.   On: 04/08/2024 13:09     LOS: 14 days   Donalda Applebaum, MD  [facility]    To contact the attending provider between 7A-7P or the covering provider during after hours 7P-7A, please log into the web site [email or web address] and access using universal [facility] password for that web site. If you do not have the password, please call the hospital operator.  04/10/2024, 10:33 AM

## 2024-04-11 DIAGNOSIS — R7881 Bacteremia: Secondary | ICD-10-CM | POA: Diagnosis not present

## 2024-04-11 DIAGNOSIS — M726 Necrotizing fasciitis: Secondary | ICD-10-CM | POA: Diagnosis not present

## 2024-04-11 DIAGNOSIS — E1100 Type 2 diabetes mellitus with hyperosmolarity without nonketotic hyperglycemic-hyperosmolar coma (NKHHC): Secondary | ICD-10-CM | POA: Diagnosis not present

## 2024-04-11 DIAGNOSIS — R6521 Severe sepsis with septic shock: Secondary | ICD-10-CM | POA: Diagnosis not present

## 2024-04-11 LAB — GLUCOSE, CAPILLARY
Glucose-Capillary: 154 mg/dL — ABNORMAL HIGH (ref 70–99)
Glucose-Capillary: 118 mg/dL — ABNORMAL HIGH (ref 70–99)
Glucose-Capillary: 88 mg/dL (ref 70–99)
Glucose-Capillary: 150 mg/dL — ABNORMAL HIGH (ref 70–99)
POC Glucose: 309 mg/dL — AB (ref 70–99)

## 2024-04-11 MED ORDER — POLYETHYLENE GLYCOL 3350 17 G PO PACK
17.0000 g | PACK | Freq: Two times a day (BID) | ORAL | Status: DC
  Administered 2024-04-13 – 2024-04-15 (×5): 17 g via ORAL
  Filled 2024-04-11 (×9): qty 1

## 2024-04-11 NOTE — TOC Progression Note (Signed)
 Transition of Care [facility]) - Progression Note    Patient Details  Name: Fred Mcintosh MRN: [id number] Date of Birth: 08-14-1960  [facility] CM/SW Contact  Inocente GORMAN Kindle, LCSW Phone Number: 04/11/2024, 9:56 AM  Clinical Narrative:    No return call from brother. [employer] engineer.    Expected Discharge Plan: Skilled Nursing Facility Barriers to Discharge: Insurance Authorization, SNF Pending bed offer, Continued Medical Work up               Expected Discharge Plan and Services In-house Referral: Clinical Social Work   Post Acute Care Choice: Skilled Nursing Facility Living arrangements for the past 2 months: Single Family Home                                       Social Drivers of Health (SDOH) Interventions SDOH Screenings   Food Insecurity: No Food Insecurity (03/08/2024)  Housing: Low Risk  (03/08/2024)  Transportation Needs: No Transportation Needs (03/08/2024)  Utilities: Not At Risk (03/08/2024)  Social Connections: Moderately Integrated (01/18/2024)  Tobacco Use: Unknown (03/27/2024)    Readmission Risk Interventions     No data to display

## 2024-04-11 NOTE — TOC CM/SW Note (Signed)
 04-11-2024   KIM B. @ [employer] [facility] [place]  : UPDATE FOR D/C PLANNING NEEDS.  PHONE# [phone]

## 2024-04-11 NOTE — Plan of Care (Signed)
  Problem: Education: Goal: Knowledge of General Education information will improve Description: Including pain rating scale, medication(s)/side effects and non-pharmacologic comfort measures Outcome: Progressing   Problem: Clinical Measurements: Goal: Respiratory complications will improve Outcome: Progressing   Problem: Clinical Measurements: Goal: Cardiovascular complication will be avoided Outcome: Progressing   

## 2024-04-11 NOTE — Progress Notes (Signed)
 PT Cancellation Note  Patient Details Name: Fred Mcintosh MRN: [id number] DOB: 1960-09-17   Cancelled Treatment:    Reason Eval/Treat Not Completed: Patient declined, no reason specified (Pt declined stating that he already got up today. Will follow up tomorrow.)   Jeri Jeanbaptiste 04/11/2024, 3:54 PM

## 2024-04-11 NOTE — Progress Notes (Signed)
 PROGRESS NOTE        PATIENT DETAILS Name: Fred Mcintosh Age: 63 y.o. Sex: male Date of Birth: [birth year] Admit Date: 03/27/2024 Admitting Physician Valinda Novas, MD ERE:Ypoo, Elna, MD  Brief Summary: Patient is a 63 y.o.  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  Admitted to the ICU-orthopedic consulted-underwent left AKA-subsequently extubated-stabilized and transferred to [facility].  Significant events: 9/10>> septic shock-admit to ICU-underwent left AKA 9/11>> extubated. 9/15>> transferred to [facility].  Significant studies: 9/10>> CXR: No active disease. 9/10>> left foot x-ray: There is surrounding TMA stump-as well as lower calf. 9/10>> x-ray left tibia/fibula: Surrounding TMA stump-and lower half of leg. 9/14>> TTE: EF 70-75%, grade 1 diastolic dysfunction 916>> TEE: No vegetation. 9/17>> CT head: No acute abnormality 9/22>> MRI brain: No acute intracranial abnormality.  Significant microbiology data: 9/10>> blood culture: Streptococcus anginosus. 9/12>> blood culture: No growth  Procedures: 9/10>> left AKA 9/16>> TEE  Consults: Orthopedic PCCM Infectious disease  Subjective: No further vomiting-no major issues overnight-lying comfortably in bed.  Objective: Vitals: Blood pressure 109/71, pulse 90, temperature 99.2 F (37.3 C), temperature source Oral, resp. rate 15, height 6' 2 (1.88 m), weight 118.4 kg, SpO2 97%.   Exam: Awake/alert Chest: Clear to auscultation CVS: S1-S2 regular Abdomen: Soft nontender nondistended Extremities: Left AKA. Neurology: Nonfocal exam  Pertinent Labs/Radiology:    Latest Ref Rng & Units 04/09/2024    2:42 AM 04/08/2024    3:01 AM 04/07/2024    2:24 AM  CBC  WBC 4.0 - 10.5 K/uL 7.4  8.5  7.8   Hemoglobin 13.0 - 17.0 g/dL 7.7  8.1  8.1   Hematocrit 39.0 -  52.0 % 24.7  26.2  26.8   Platelets 150 - 400 K/uL 175  192  184     Lab Results  Component Value Date   NA 138 04/09/2024   K 4.8 04/09/2024   CL 107 04/09/2024   CO2 23 04/09/2024      Assessment/Plan: Septic shock secondary to left lower extremity necrotizing fasciitis and streptococcal bacteremia Sepsis physiology has resolved S/p left AKA by Dr. Harden on 9/10 TEE negative for endocarditis On IV penicillin  G-EOT 9/25.  Acute metabolic encephalopathy Secondary to sepsis physiology Neuroimaging negative Improved-awake alert this morning  Vomiting No further vomiting Supportive care with antiemetics.   Normocytic anemia  Likely secondary to critical/acute illness Follow CBC.  Hypothyroidism Synthroid   HTN BP stable Currently not on any antihypertensives  DM-2 (A1c 6.7 on 9/10) CBG stable Lantus  15 units daily+ 4 units 3 times daily with meals+ SSI  Recent Labs    04/10/24 1611 04/10/24 2025 04/11/24 0842  GLUCAP 118* 88 150*     Bronchial asthma Stable/no wheezing As needed bronchodilators.  Nutrition Status: Nutrition Problem: Increased nutrient needs Etiology: wound healing Signs/Symptoms: estimated needs Interventions: MVI, Juven, Tube feeding    Pressure Ulcer: Wound 03/27/24 Pressure Injury Buttocks Left Stage 3 -  Full thickness tissue loss. Subcutaneous fat may be visible but bone, tendon or muscle are NOT exposed. (Active)     Wound 03/27/24 Pressure Injury Sacrum Stage 3 -  Full thickness tissue loss. Subcutaneous fat may be visible but bone, tendon or muscle are NOT exposed. (Active)     Wound 04/10/24 1758 Pressure  Injury Thigh Posterior;Proximal;Right Stage 3 -  Full thickness tissue loss. Subcutaneous fat may be visible but bone, tendon or muscle are NOT exposed. (Active)    Class I obesity Estimated body mass index is 33.51 kg/m as calculated from the following:   Height as of this encounter: 6' 2 (1.88 m).   Weight as of this  encounter: 118.4 kg.   Code status:   Code Status: Full Code   DVT Prophylaxis: SCD's Start: 03/27/24 1747 heparin  injection 5,000 Units Start: 03/27/24 1400   Family Communication: None at bedside   Disposition Plan: Status is: Inpatient Remains inpatient appropriate because: Severity of illness   Planned Discharge Destination:Skilled nursing facility   Diet: Diet Order             Diet regular Room service appropriate? No; Fluid consistency: Thin  Diet effective now                     Antimicrobial agents: Anti-infectives (From admission, onward)    Start     Dose/Rate Route Frequency Ordered Stop   04/03/24 1200  penicillin  G potassium 24 Million Units in dextrose  5 % 500 mL CONTINUOUS infusion        24 Million Units 20.8 mL/hr over 24 Hours Intravenous Every 24 hours 04/03/24 1013 04/13/24 0959   03/29/24 1200  cefTRIAXone  (ROCEPHIN ) 2 g in sodium chloride  0.9 % 100 mL IVPB  Status:  Discontinued        2 g 200 mL/hr over 30 Minutes Intravenous Every 24 hours 03/29/24 1037 04/03/24 1012   03/27/24 1400  piperacillin -tazobactam (ZOSYN ) IVPB 3.375 g  Status:  Discontinued        3.375 g 12.5 mL/hr over 240 Minutes Intravenous Every 8 hours 03/27/24 1208 03/29/24 1037   03/27/24 1300  linezolid  (ZYVOX ) IVPB 600 mg  Status:  Discontinued        600 mg 300 mL/hr over 60 Minutes Intravenous Every 12 hours 03/27/24 1208 03/29/24 1037   03/27/24 1215  ceFAZolin  (ANCEF ) IVPB 3g/150 mL premix  Status:  Discontinued        3 g 300 mL/hr over 30 Minutes Intravenous On call to O.R. 03/27/24 1206 03/27/24 1743   03/27/24 0900  linezolid  (ZYVOX ) IVPB 600 mg        600 mg 300 mL/hr over 60 Minutes Intravenous  Once 03/27/24 0837 03/27/24 1057   03/27/24 0845  piperacillin -tazobactam (ZOSYN ) IVPB 3.375 g        3.375 g 100 mL/hr over 30 Minutes Intravenous  Once 03/27/24 0837 03/27/24 0958        MEDICATIONS: Scheduled Meds:  sodium chloride    Intravenous Once    acetaminophen   650 mg Oral Q6H WA   vitamin C   1,000 mg Oral Daily   Chlorhexidine  Gluconate Cloth  6 each Topical Daily   docusate sodium   100 mg Oral BID   feeding supplement  237 mL Oral 5 X Daily   heparin   5,000 Units Subcutaneous Q8H   insulin  aspart  0-15 Units Subcutaneous TID WC   insulin  aspart  0-5 Units Subcutaneous QHS   insulin  aspart  4 Units Subcutaneous TID WC   insulin  glargine  15 Units Subcutaneous Q24H   leptospermum manuka honey  1 Application Topical Daily   levothyroxine   50 mcg Oral Q0600   metoCLOPramide  (REGLAN ) injection  10 mg Intravenous TID AC   mirtazapine   30 mg Oral QHS   pantoprazole   40 mg Oral Daily  polyethylene glycol  17 g Oral Daily   Continuous Infusions:  penicillin  G potassium 24 Million Units in dextrose  5 % 500 mL CONTINUOUS infusion 24 Million Units (04/11/24 1056)   PRN Meds:.albuterol , alum & mag hydroxide-simeth, dextrose , hydrALAZINE , labetalol , methocarbamol  (ROBAXIN ) injection, ondansetron  (ZOFRAN ) IV, mouth rinse, oxyCODONE    I have personally reviewed following labs and imaging studies  LABORATORY DATA: CBC: Recent Labs  Lab 04/05/24 0259 04/06/24 0355 04/07/24 0224 04/08/24 0301 04/09/24 0242  WBC 10.0 9.7 7.8 8.5 7.4  NEUTROABS 7.7 7.4  --   --   --   HGB 8.2* 8.6* 8.1* 8.1* 7.7*  HCT 26.9* 28.0* 26.8* 26.2* 24.7*  MCV 93.1 93.6 94.7 92.6 91.5  PLT 187 196 184 192 175    Basic Metabolic Panel: Recent Labs  Lab 04/05/24 0259 04/06/24 0355 04/07/24 0224 04/08/24 0301 04/09/24 0242  NA 148* 145 143 141 138  K 4.8 4.8 5.1 5.1 4.8  CL 112* 113* 115* 109 107  CO2 23 21* 22 24 23   GLUCOSE 205* 131* 231* 210* 136*  BUN 46* 35* 34* 35* 32*  CREATININE 1.23 1.29* 1.39* 1.11 1.20  CALCIUM  8.5* 8.6* 8.4* 8.5* 8.5*  MG 2.3  --   --   --   --   PHOS 3.3  --  4.1 3.3 3.3    GFR: Estimated Creatinine Clearance: 87.3 mL/min (by C-G formula based on SCr of 1.2 mg/dL).  Liver Function Tests: Recent Labs  Lab  04/05/24 0259 04/06/24 0355  AST 16 15  ALT 12 12  ALKPHOS 71 68  BILITOT 0.8 0.9  PROT 6.3* 6.4*  ALBUMIN 1.7* 1.8*   No results for input(s): LIPASE, AMYLASE in the last 168 hours. No results for input(s): AMMONIA in the last 168 hours.  Coagulation Profile: No results for input(s): INR, PROTIME in the last 168 hours.  Cardiac Enzymes: No results for input(s): CKTOTAL, CKMB, CKMBINDEX, TROPONINI in the last 168 hours.  BNP (last 3 results) No results for input(s): PROBNP in the last 8760 hours.  Lipid Profile: No results for input(s): CHOL, HDL, LDLCALC, TRIG, CHOLHDL, LDLDIRECT in the last 72 hours.  Thyroid  Function Tests: No results for input(s): TSH, T4TOTAL, FREET4, T3FREE, THYROIDAB in the last 72 hours.  Anemia Panel: No results for input(s): VITAMINB12, FOLATE, FERRITIN, TIBC, IRON, RETICCTPCT in the last 72 hours.   Urine analysis:    Component Value Date/Time   COLORURINE AMBER (A) 03/27/2024 0934   APPEARANCEUR HAZY (A) 03/27/2024 0934   LABSPEC 1.016 03/27/2024 0934   PHURINE 5.0 03/27/2024 0934   GLUCOSEU >=500 (A) 03/27/2024 0934   HGBUR LARGE (A) 03/27/2024 0934   BILIRUBINUR NEGATIVE 03/27/2024 0934   KETONESUR NEGATIVE 03/27/2024 0934   PROTEINUR NEGATIVE 03/27/2024 0934   NITRITE NEGATIVE 03/27/2024 0934   LEUKOCYTESUR TRACE (A) 03/27/2024 0934    Sepsis Labs: Lactic Acid, Venous    Component Value Date/Time   LATICACIDVEN 7.6 (HH) 03/27/2024 1529    MICROBIOLOGY: No results found for this or any previous visit (from the past 240 hours).  RADIOLOGY STUDIES/RESULTS: No results found.    LOS: 15 days   Donalda Applebaum, MD  [facility]    To contact the attending provider between 7A-7P or the covering provider during after hours 7P-7A, please log into the web site [email or web address] and access using universal [place] password for that web site. If you do not have the  password, please call the hospital operator.  04/11/2024, 12:02 PM

## 2024-04-12 DIAGNOSIS — R7881 Bacteremia: Secondary | ICD-10-CM | POA: Diagnosis not present

## 2024-04-12 DIAGNOSIS — M726 Necrotizing fasciitis: Secondary | ICD-10-CM | POA: Diagnosis not present

## 2024-04-12 DIAGNOSIS — B955 Unspecified streptococcus as the cause of diseases classified elsewhere: Secondary | ICD-10-CM | POA: Diagnosis not present

## 2024-04-12 DIAGNOSIS — R6521 Severe sepsis with septic shock: Secondary | ICD-10-CM | POA: Diagnosis not present

## 2024-04-12 LAB — GLUCOSE, CAPILLARY
Glucose-Capillary: 156 mg/dL — ABNORMAL HIGH (ref 70–99)
Glucose-Capillary: 309 mg/dL — ABNORMAL HIGH (ref 70–99)
Glucose-Capillary: 99 mg/dL (ref 70–99)
Glucose-Capillary: 127 mg/dL — ABNORMAL HIGH (ref 70–99)
Glucose-Capillary: 146 mg/dL — ABNORMAL HIGH (ref 70–99)
Glucose-Capillary: 183 mg/dL — ABNORMAL HIGH (ref 70–99)
Glucose-Capillary: 184 mg/dL — ABNORMAL HIGH (ref 70–99)
Glucose-Capillary: 183 mg/dL — ABNORMAL HIGH (ref 70–99)
Glucose-Capillary: 163 mg/dL — ABNORMAL HIGH (ref 70–99)

## 2024-04-12 NOTE — TOC Progression Note (Addendum)
 Transition of Care [facility]) - Progression Note    Patient Details  Name: Fred Mcintosh MRN: [id number] Date of Birth: [birth year]  Transition of [facility]) CM/SW Contact  Inocente GORMAN Kindle, LCSW Phone Number: 04/12/2024, 1:48 PM  Clinical Narrative:    CSW received call from [place] with [facility] stating that patient's insurance does not cover SNF, only dental. CSW left voicemail for [place] with [employer] [place]  : [phone]) to confirm that information as patient does not have any other coverage.   CSW left another voicemail for patient's brother.   CSW received voicemail from [place] with [facility] [phone]/cell [phone]) requesting an update. CSW left her a return voicemail.    Expected Discharge Plan: Skilled Nursing Facility Barriers to Discharge: Insurance Authorization, SNF Pending bed offer, Continued Medical Work up               Expected Discharge Plan and Services In-house Referral: Clinical Social Work   Post Acute Care Choice: Skilled Nursing Facility Living arrangements for the past 2 months: Single Family Home                                       Social Drivers of Health (SDOH) Interventions SDOH Screenings   Food Insecurity: No Food Insecurity (03/08/2024)  Housing: Low Risk  (03/08/2024)  Transportation Needs: No Transportation Needs (03/08/2024)  Utilities: Not At Risk (03/08/2024)  Social Connections: Moderately Integrated (01/18/2024)  Tobacco Use: Unknown (03/27/2024)    Readmission Risk Interventions     No data to display

## 2024-04-12 NOTE — Plan of Care (Signed)
  Problem: Education: Goal: Knowledge of General Education information will improve Description: Including pain rating scale, medication(s)/side effects and non-pharmacologic comfort measures Outcome: Progressing   Problem: Health Behavior/Discharge Planning: Goal: Ability to manage health-related needs will improve Outcome: Progressing   Problem: Clinical Measurements: Goal: Respiratory complications will improve Outcome: Progressing Goal: Cardiovascular complication will be avoided Outcome: Progressing   Problem: Nutrition: Goal: Adequate nutrition will be maintained Outcome: Progressing   Problem: Coping: Goal: Level of anxiety will decrease Outcome: Progressing   

## 2024-04-12 NOTE — Progress Notes (Signed)
 PROGRESS NOTE        PATIENT DETAILS Name: Fred Mcintosh Age: 63 y.o. Sex: male Date of Birth: 09-07-1960 Admit Date: 03/27/2024 Admitting Physician Valinda Novas, MD ERE:Ypoo, Elna, MD  Brief Summary: Patient is a 63 y.o.  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  Admitted to the ICU-orthopedic consulted-underwent left AKA-subsequently extubated-stabilized and transferred to [facility].  Significant events: 9/10>> septic shock-admit to ICU-underwent left AKA 9/11>> extubated. 9/15>> transferred to [facility].  Significant studies: 9/10>> CXR: No active disease. 9/10>> left foot x-ray: There is surrounding TMA stump-as well as lower calf. 9/10>> x-ray left tibia/fibula: Surrounding TMA stump-and lower half of leg. 9/14>> TTE: EF 70-75%, grade 1 diastolic dysfunction 916>> TEE: No vegetation. 9/17>> CT head: No acute abnormality 9/22>> MRI brain: No acute intracranial abnormality.  Significant microbiology data: 9/10>> blood culture: Streptococcus anginosus. 9/12>> blood culture: No growth  Procedures: 9/10>> left AKA 9/16>> TEE  Consults: Orthopedic PCCM Infectious disease  Subjective: Stable overnight-no major issues-awaiting SNF bed-no vomiting.  Objective: Vitals: Blood pressure 109/78, pulse 98, temperature 98.4 F (36.9 C), temperature source Oral, resp. rate 11, height 6' 2 (1.88 m), weight 118.6 kg, SpO2 97%.   Exam: Awake/alert Chest: Clear to auscultation CVS: S1-S2 regular Abdomen: Soft nontender Extremities: S/p left AKA  Pertinent Labs/Radiology:    Latest Ref Rng & Units 04/09/2024    2:42 AM 04/08/2024    3:01 AM 04/07/2024    2:24 AM  CBC  WBC 4.0 - 10.5 K/uL 7.4  8.5  7.8   Hemoglobin 13.0 - 17.0 g/dL 7.7  8.1  8.1   Hematocrit 39.0 - 52.0 % 24.7  26.2  26.8   Platelets 150 -  400 K/uL 175  192  184     Lab Results  Component Value Date   NA 138 04/09/2024   K 4.8 04/09/2024   CL 107 04/09/2024   CO2 23 04/09/2024      Assessment/Plan: Septic shock secondary to left lower extremity necrotizing fasciitis and streptococcal bacteremia Sepsis physiology has resolved S/p left AKA by Dr. Harden on 9/10 TEE negative for endocarditis On IV penicillin  G-EOT 9/26.  Acute metabolic encephalopathy Secondary to sepsis physiology Neuroimaging negative Improved-awake alert this morning  Vomiting No further vomiting Supportive care with antiemetics.   Normocytic anemia  Likely secondary to critical/acute illness Follow CBC.  Hypothyroidism Synthroid   HTN BP stable Currently not on any antihypertensives  DM-2 (A1c 6.7 on 9/10) CBG stable Lantus  15 units daily+ 4 units 3 times daily with meals+ SSI  Recent Labs    04/11/24 2109 04/12/24 0800 04/12/24 0859  GLUCAP 146* 183* 184*     Bronchial asthma Stable/no wheezing As needed bronchodilators.  Nutrition Status: Nutrition Problem: Increased nutrient needs Etiology: wound healing Signs/Symptoms: estimated needs Interventions: MVI, Juven, Tube feeding    Pressure Ulcer: Wound 03/27/24 Pressure Injury Buttocks Left Stage 3 -  Full thickness tissue loss. Subcutaneous fat may be visible but bone, tendon or muscle are NOT exposed. (Active)     Wound 03/27/24 Pressure Injury Sacrum Stage 3 -  Full thickness tissue loss. Subcutaneous fat may be visible but bone, tendon or muscle are NOT exposed. (Active)     Wound 04/10/24 1758 Pressure Injury Thigh Posterior;Proximal;Right Stage 3 -  Full thickness tissue loss. Subcutaneous fat may be visible but bone, tendon or muscle are NOT exposed. (Active)    Class I obesity Estimated body mass index is 33.57 kg/m as calculated from the following:   Height as of this encounter: 6' 2 (1.88 m).   Weight as of this encounter: 118.6 kg.   Code status:    Code Status: Full Code   DVT Prophylaxis: SCD's Start: 03/27/24 1747 heparin  injection 5,000 Units Start: 03/27/24 1400   Family Communication: None at bedside   Disposition Plan: Status is: Inpatient Remains inpatient appropriate because: Severity of illness   Planned Discharge Destination:Skilled nursing facility   Diet: Diet Order             Diet regular Room service appropriate? No; Fluid consistency: Thin  Diet effective now                     Antimicrobial agents: Anti-infectives (From admission, onward)    Start     Dose/Rate Route Frequency Ordered Stop   04/03/24 1200  penicillin  G potassium 24 Million Units in dextrose  5 % 500 mL CONTINUOUS infusion        24 Million Units 20.8 mL/hr over 24 Hours Intravenous Every 24 hours 04/03/24 1013 04/13/24 0959   03/29/24 1200  cefTRIAXone  (ROCEPHIN ) 2 g in sodium chloride  0.9 % 100 mL IVPB  Status:  Discontinued        2 g 200 mL/hr over 30 Minutes Intravenous Every 24 hours 03/29/24 1037 04/03/24 1012   03/27/24 1400  piperacillin -tazobactam (ZOSYN ) IVPB 3.375 g  Status:  Discontinued        3.375 g 12.5 mL/hr over 240 Minutes Intravenous Every 8 hours 03/27/24 1208 03/29/24 1037   03/27/24 1300  linezolid  (ZYVOX ) IVPB 600 mg  Status:  Discontinued        600 mg 300 mL/hr over 60 Minutes Intravenous Every 12 hours 03/27/24 1208 03/29/24 1037   03/27/24 1215  ceFAZolin  (ANCEF ) IVPB 3g/150 mL premix  Status:  Discontinued        3 g 300 mL/hr over 30 Minutes Intravenous On call to O.R. 03/27/24 1206 03/27/24 1743   03/27/24 0900  linezolid  (ZYVOX ) IVPB 600 mg        600 mg 300 mL/hr over 60 Minutes Intravenous  Once 03/27/24 0837 03/27/24 1057   03/27/24 0845  piperacillin -tazobactam (ZOSYN ) IVPB 3.375 g        3.375 g 100 mL/hr over 30 Minutes Intravenous  Once 03/27/24 0837 03/27/24 0958        MEDICATIONS: Scheduled Meds:  sodium chloride    Intravenous Once   acetaminophen   650 mg Oral Q6H WA    vitamin C   1,000 mg Oral Daily   Chlorhexidine  Gluconate Cloth  6 each Topical Daily   docusate sodium   100 mg Oral BID   feeding supplement  237 mL Oral 5 X Daily   heparin   5,000 Units Subcutaneous Q8H   insulin  aspart  0-15 Units Subcutaneous TID WC   insulin  aspart  0-5 Units Subcutaneous QHS   insulin  aspart  4 Units Subcutaneous TID WC   insulin  glargine  15 Units Subcutaneous Q24H   leptospermum manuka honey  1 Application Topical Daily   levothyroxine   50 mcg Oral Q0600   metoCLOPramide  (REGLAN ) injection  10 mg Intravenous TID AC   mirtazapine   30 mg Oral QHS   pantoprazole   40 mg Oral Daily   polyethylene glycol  17 g  Oral BID   Continuous Infusions:  penicillin  G potassium 24 Million Units in dextrose  5 % 500 mL CONTINUOUS infusion 24 Million Units (04/12/24 1046)   PRN Meds:.albuterol , alum & mag hydroxide-simeth, dextrose , hydrALAZINE , labetalol , methocarbamol  (ROBAXIN ) injection, ondansetron  (ZOFRAN ) IV, mouth rinse, oxyCODONE    I have personally reviewed following labs and imaging studies  LABORATORY DATA: CBC: Recent Labs  Lab 04/06/24 0355 04/07/24 0224 04/08/24 0301 04/09/24 0242  WBC 9.7 7.8 8.5 7.4  NEUTROABS 7.4  --   --   --   HGB 8.6* 8.1* 8.1* 7.7*  HCT 28.0* 26.8* 26.2* 24.7*  MCV 93.6 94.7 92.6 91.5  PLT 196 184 192 175    Basic Metabolic Panel: Recent Labs  Lab 04/06/24 0355 04/07/24 0224 04/08/24 0301 04/09/24 0242  NA 145 143 141 138  K 4.8 5.1 5.1 4.8  CL 113* 115* 109 107  CO2 21* 22 24 23   GLUCOSE 131* 231* 210* 136*  BUN 35* 34* 35* 32*  CREATININE 1.29* 1.39* 1.11 1.20  CALCIUM  8.6* 8.4* 8.5* 8.5*  PHOS  --  4.1 3.3 3.3    GFR: Estimated Creatinine Clearance: 87.4 mL/min (by C-G formula based on SCr of 1.2 mg/dL).  Liver Function Tests: Recent Labs  Lab 04/06/24 0355  AST 15  ALT 12  ALKPHOS 68  BILITOT 0.9  PROT 6.4*  ALBUMIN 1.8*   No results for input(s): LIPASE, AMYLASE in the last 168 hours. No results  for input(s): AMMONIA in the last 168 hours.  Coagulation Profile: No results for input(s): INR, PROTIME in the last 168 hours.  Cardiac Enzymes: No results for input(s): CKTOTAL, CKMB, CKMBINDEX, TROPONINI in the last 168 hours.  BNP (last 3 results) No results for input(s): PROBNP in the last 8760 hours.  Lipid Profile: No results for input(s): CHOL, HDL, LDLCALC, TRIG, CHOLHDL, LDLDIRECT in the last 72 hours.  Thyroid  Function Tests: No results for input(s): TSH, T4TOTAL, FREET4, T3FREE, THYROIDAB in the last 72 hours.  Anemia Panel: No results for input(s): VITAMINB12, FOLATE, FERRITIN, TIBC, IRON, RETICCTPCT in the last 72 hours.   Urine analysis:    Component Value Date/Time   COLORURINE AMBER (A) 03/27/2024 0934   APPEARANCEUR HAZY (A) 03/27/2024 0934   LABSPEC 1.016 03/27/2024 0934   PHURINE 5.0 03/27/2024 0934   GLUCOSEU >=500 (A) 03/27/2024 0934   HGBUR LARGE (A) 03/27/2024 0934   BILIRUBINUR NEGATIVE 03/27/2024 0934   KETONESUR NEGATIVE 03/27/2024 0934   PROTEINUR NEGATIVE 03/27/2024 0934   NITRITE NEGATIVE 03/27/2024 0934   LEUKOCYTESUR TRACE (A) 03/27/2024 0934    Sepsis Labs: Lactic Acid, Venous    Component Value Date/Time   LATICACIDVEN 7.6 (HH) 03/27/2024 1529    MICROBIOLOGY: No results found for this or any previous visit (from the past 240 hours).  RADIOLOGY STUDIES/RESULTS: No results found.    LOS: 16 days   Donalda Applebaum, MD  [facility]    To contact the attending provider between 7A-7P or the covering provider during after hours 7P-7A, please log into the web site [email or web address] and access using universal [place] password for that web site. If you do not have the password, please call the hospital operator.  04/12/2024, 11:27 AM

## 2024-04-13 DIAGNOSIS — B955 Unspecified streptococcus as the cause of diseases classified elsewhere: Secondary | ICD-10-CM | POA: Diagnosis not present

## 2024-04-13 DIAGNOSIS — R7881 Bacteremia: Secondary | ICD-10-CM | POA: Diagnosis not present

## 2024-04-13 DIAGNOSIS — R6521 Severe sepsis with septic shock: Secondary | ICD-10-CM | POA: Diagnosis not present

## 2024-04-13 DIAGNOSIS — M726 Necrotizing fasciitis: Secondary | ICD-10-CM | POA: Diagnosis not present

## 2024-04-13 MED ORDER — METOCLOPRAMIDE HCL 5 MG/ML IJ SOLN
5.0000 mg | Freq: Three times a day (TID) | INTRAMUSCULAR | Status: AC
  Administered 2024-04-13 (×3): 5 mg via INTRAVENOUS
  Filled 2024-04-13 (×3): qty 2

## 2024-04-13 NOTE — Progress Notes (Signed)
 PROGRESS NOTE        PATIENT DETAILS Name: Fred Mcintosh Age: 63 y.o. Sex: male Date of Birth: [birth year] Admit Date: 03/27/2024 Admitting Physician Valinda Novas, MD ERE:Ypoo, Elna, MD  Brief Summary: Patient is a 62 y.o.  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  Admitted to the ICU-orthopedic consulted-underwent left AKA-subsequently extubated-stabilized and transferred to [facility].  Significant events: 9/10>> septic shock-admit to ICU-underwent left AKA 9/11>> extubated. 9/15>> transferred to [facility].  Significant studies: 9/10>> CXR: No active disease. 9/10>> left foot x-ray: There is surrounding TMA stump-as well as lower calf. 9/10>> x-ray left tibia/fibula: Surrounding TMA stump-and lower half of leg. 9/14>> TTE: EF 70-75%, grade 1 diastolic dysfunction 916>> TEE: No vegetation. 9/17>> CT head: No acute abnormality 9/22>> MRI brain: No acute intracranial abnormality.  Significant microbiology data: 9/10>> blood culture: Streptococcus anginosus. 9/12>> blood culture: No growth  Procedures: 9/10>> left AKA 9/16>> TEE  Consults: Orthopedic PCCM Infectious disease  Subjective: No major issues overnight-lying comfortably in bed.  Objective: Vitals: Blood pressure 106/70, pulse (!) 101, temperature 98.9 F (37.2 C), temperature source Oral, resp. rate 13, height 6' 2 (1.88 m), weight 117 kg, SpO2 97%.   Exam: Awake/alert Clear to auscultation S1-S2 regular Abdomen: Soft nontender Left AKA.  Pertinent Labs/Radiology:    Latest Ref Rng & Units 04/09/2024    2:42 AM 04/08/2024    3:01 AM 04/07/2024    2:24 AM  CBC  WBC 4.0 - 10.5 K/uL 7.4  8.5  7.8   Hemoglobin 13.0 - 17.0 g/dL 7.7  8.1  8.1   Hematocrit 39.0 - 52.0 % 24.7  26.2  26.8   Platelets 150 - 400 K/uL 175  192  184     Lab  Results  Component Value Date   NA 138 04/09/2024   K 4.8 04/09/2024   CL 107 04/09/2024   CO2 23 04/09/2024      Assessment/Plan: Septic shock secondary to left lower extremity necrotizing fasciitis and streptococcal bacteremia Sepsis physiology has resolved S/p left AKA by Dr. Harden on 9/10 TEE negative for endocarditis On IV penicillin  G-EOT 9/26.  Acute metabolic encephalopathy Secondary to sepsis physiology Neuroimaging negative Improved-awake alert this morning  Vomiting No further vomiting Supportive care with antiemetics.   Normocytic anemia  Likely secondary to critical/acute illness Follow CBC.  Hypothyroidism Synthroid   HTN BP stable Currently not on any antihypertensives  DM-2 (A1c 6.7 on 9/10) CBG stable Lantus  15 units daily+ 4 units 3 times daily with meals+ SSI  Recent Labs    04/12/24 0859 04/12/24 1223 04/12/24 1330  GLUCAP 184* 183* 163*     Bronchial asthma Stable/no wheezing As needed bronchodilators.  Nutrition Status: Nutrition Problem: Increased nutrient needs Etiology: wound healing Signs/Symptoms: estimated needs Interventions: MVI, Juven, Tube feeding    Pressure Ulcer: Wound 03/27/24 Pressure Injury Buttocks Left Stage 3 -  Full thickness tissue loss. Subcutaneous fat may be visible but bone, tendon or muscle are NOT exposed. (Active)     Wound 03/27/24 Pressure Injury Sacrum Stage 3 -  Full thickness tissue loss. Subcutaneous fat may be visible but bone, tendon or muscle are NOT exposed. (Active)     Wound 04/10/24 1758 Pressure Injury Thigh Posterior;Proximal;Right Stage 3 -  Full  thickness tissue loss. Subcutaneous fat may be visible but bone, tendon or muscle are NOT exposed. (Active)    Class I obesity Estimated body mass index is 33.12 kg/m as calculated from the following:   Height as of this encounter: 6' 2 (1.88 m).   Weight as of this encounter: 117 kg.   Code status:   Code Status: Full Code   DVT  Prophylaxis: SCD's Start: 03/27/24 1747 heparin  injection 5,000 Units Start: 03/27/24 1400   Family Communication: None at bedside   Disposition Plan: Status is: Inpatient Remains inpatient appropriate because: Severity of illness   Planned Discharge Destination:Skilled nursing facility-medically stable-awaiting bed   Diet: Diet Order             Diet regular Room service appropriate? No; Fluid consistency: Thin  Diet effective now                     Antimicrobial agents: Anti-infectives (From admission, onward)    Start     Dose/Rate Route Frequency Ordered Stop   04/03/24 1200  penicillin  G potassium 24 Million Units in dextrose  5 % 500 mL CONTINUOUS infusion        24 Million Units 20.8 mL/hr over 24 Hours Intravenous Every 24 hours 04/03/24 1013 04/13/24 0959   03/29/24 1200  cefTRIAXone  (ROCEPHIN ) 2 g in sodium chloride  0.9 % 100 mL IVPB  Status:  Discontinued        2 g 200 mL/hr over 30 Minutes Intravenous Every 24 hours 03/29/24 1037 04/03/24 1012   03/27/24 1400  piperacillin -tazobactam (ZOSYN ) IVPB 3.375 g  Status:  Discontinued        3.375 g 12.5 mL/hr over 240 Minutes Intravenous Every 8 hours 03/27/24 1208 03/29/24 1037   03/27/24 1300  linezolid  (ZYVOX ) IVPB 600 mg  Status:  Discontinued        600 mg 300 mL/hr over 60 Minutes Intravenous Every 12 hours 03/27/24 1208 03/29/24 1037   03/27/24 1215  ceFAZolin  (ANCEF ) IVPB 3g/150 mL premix  Status:  Discontinued        3 g 300 mL/hr over 30 Minutes Intravenous On call to O.R. 03/27/24 1206 03/27/24 1743   03/27/24 0900  linezolid  (ZYVOX ) IVPB 600 mg        600 mg 300 mL/hr over 60 Minutes Intravenous  Once 03/27/24 0837 03/27/24 1057   03/27/24 0845  piperacillin -tazobactam (ZOSYN ) IVPB 3.375 g        3.375 g 100 mL/hr over 30 Minutes Intravenous  Once 03/27/24 0837 03/27/24 0958        MEDICATIONS: Scheduled Meds:  sodium chloride    Intravenous Once   acetaminophen   650 mg Oral Q6H WA    vitamin C   1,000 mg Oral Daily   Chlorhexidine  Gluconate Cloth  6 each Topical Daily   docusate sodium   100 mg Oral BID   feeding supplement  237 mL Oral 5 X Daily   heparin   5,000 Units Subcutaneous Q8H   insulin  aspart  0-15 Units Subcutaneous TID WC   insulin  aspart  0-5 Units Subcutaneous QHS   insulin  aspart  4 Units Subcutaneous TID WC   insulin  glargine  15 Units Subcutaneous Q24H   leptospermum manuka honey  1 Application Topical Daily   levothyroxine   50 mcg Oral Q0600   metoCLOPramide  (REGLAN ) injection  5 mg Intravenous TID AC   mirtazapine   30 mg Oral QHS   pantoprazole   40 mg Oral Daily   polyethylene glycol  17  g Oral BID   Continuous Infusions:   PRN Meds:.albuterol , alum & mag hydroxide-simeth, dextrose , hydrALAZINE , labetalol , methocarbamol  (ROBAXIN ) injection, ondansetron  (ZOFRAN ) IV, mouth rinse, oxyCODONE    I have personally reviewed following labs and imaging studies  LABORATORY DATA: CBC: Recent Labs  Lab 04/07/24 0224 04/08/24 0301 04/09/24 0242  WBC 7.8 8.5 7.4  HGB 8.1* 8.1* 7.7*  HCT 26.8* 26.2* 24.7*  MCV 94.7 92.6 91.5  PLT 184 192 175    Basic Metabolic Panel: Recent Labs  Lab 04/07/24 0224 04/08/24 0301 04/09/24 0242  NA 143 141 138  K 5.1 5.1 4.8  CL 115* 109 107  CO2 22 24 23   GLUCOSE 231* 210* 136*  BUN 34* 35* 32*  CREATININE 1.39* 1.11 1.20  CALCIUM  8.4* 8.5* 8.5*  PHOS 4.1 3.3 3.3    GFR: Estimated Creatinine Clearance: 86.8 mL/min (by C-G formula based on SCr of 1.2 mg/dL).  Liver Function Tests: No results for input(s): AST, ALT, ALKPHOS, BILITOT, PROT, ALBUMIN in the last 168 hours.  No results for input(s): LIPASE, AMYLASE in the last 168 hours. No results for input(s): AMMONIA in the last 168 hours.  Coagulation Profile: No results for input(s): INR, PROTIME in the last 168 hours.  Cardiac Enzymes: No results for input(s): CKTOTAL, CKMB, CKMBINDEX, TROPONINI in the last 168  hours.  BNP (last 3 results) No results for input(s): PROBNP in the last 8760 hours.  Lipid Profile: No results for input(s): CHOL, HDL, LDLCALC, TRIG, CHOLHDL, LDLDIRECT in the last 72 hours.  Thyroid  Function Tests: No results for input(s): TSH, T4TOTAL, FREET4, T3FREE, THYROIDAB in the last 72 hours.  Anemia Panel: No results for input(s): VITAMINB12, FOLATE, FERRITIN, TIBC, IRON, RETICCTPCT in the last 72 hours.   Urine analysis:    Component Value Date/Time   COLORURINE AMBER (A) 03/27/2024 0934   APPEARANCEUR HAZY (A) 03/27/2024 0934   LABSPEC 1.016 03/27/2024 0934   PHURINE 5.0 03/27/2024 0934   GLUCOSEU >=500 (A) 03/27/2024 0934   HGBUR LARGE (A) 03/27/2024 0934   BILIRUBINUR NEGATIVE 03/27/2024 0934   KETONESUR NEGATIVE 03/27/2024 0934   PROTEINUR NEGATIVE 03/27/2024 0934   NITRITE NEGATIVE 03/27/2024 0934   LEUKOCYTESUR TRACE (A) 03/27/2024 0934    Sepsis Labs: Lactic Acid, Venous    Component Value Date/Time   LATICACIDVEN 7.6 (HH) 03/27/2024 1529    MICROBIOLOGY: No results found for this or any previous visit (from the past 240 hours).  RADIOLOGY STUDIES/RESULTS: No results found.    LOS: 17 days   Donalda Applebaum, MD  [facility]    To contact the attending provider between 7A-7P or the covering provider during after hours 7P-7A, please log into the web site [email or web address] and access using universal [place] password for that web site. If you do not have the password, please call the hospital operator.  04/13/2024, 10:53 AM

## 2024-04-13 NOTE — Plan of Care (Signed)
  Problem: Education: Goal: Knowledge of General Education information will improve Description: Including pain rating scale, medication(s)/side effects and non-pharmacologic comfort measures Outcome: Progressing   Problem: Clinical Measurements: Goal: Ability to maintain clinical measurements within normal limits will improve Outcome: Progressing Goal: Will remain free from infection Outcome: Progressing Goal: Diagnostic test results will improve Outcome: Progressing   Problem: Nutrition: Goal: Adequate nutrition will be maintained Outcome: Progressing   Problem: Coping: Goal: Level of anxiety will decrease Outcome: Progressing   Problem: Safety: Goal: Ability to remain free from injury will improve Outcome: Progressing   Problem: Skin Integrity: Goal: Risk for impaired skin integrity will decrease Outcome: Progressing

## 2024-04-14 DIAGNOSIS — R6521 Severe sepsis with septic shock: Secondary | ICD-10-CM | POA: Diagnosis not present

## 2024-04-14 DIAGNOSIS — B955 Unspecified streptococcus as the cause of diseases classified elsewhere: Secondary | ICD-10-CM | POA: Diagnosis not present

## 2024-04-14 DIAGNOSIS — M726 Necrotizing fasciitis: Secondary | ICD-10-CM | POA: Diagnosis not present

## 2024-04-14 DIAGNOSIS — R7881 Bacteremia: Secondary | ICD-10-CM | POA: Diagnosis not present

## 2024-04-14 LAB — BASIC METABOLIC PANEL WITH GFR
Anion gap: 6 (ref 5–15)
BUN: 22 mg/dL (ref 8–23)
CO2: 25 mmol/L (ref 22–32)
Calcium: 8.1 mg/dL — ABNORMAL LOW (ref 8.9–10.3)
Chloride: 103 mmol/L (ref 98–111)
Creatinine, Ser: 1.05 mg/dL (ref 0.61–1.24)
GFR, Estimated: >60 mL/min (ref >60)
Glucose, Bld: 157 mg/dL — ABNORMAL HIGH (ref 70–99)
Potassium: 4.1 mmol/L (ref 3.5–5.1)
Sodium: 134 mmol/L — ABNORMAL LOW (ref 135–145)

## 2024-04-14 LAB — CBC
HCT: 23.7 % — ABNORMAL LOW (ref 39.0–52.0)
Hemoglobin: 7.6 g/dL — ABNORMAL LOW (ref 13.0–17.0)
MCH: 28.8 pg (ref 26.0–34.0)
MCHC: 32.1 g/dL (ref 30.0–36.0)
MCV: 89.8 fL (ref 80.0–100.0)
Platelets: 142 K/uL — ABNORMAL LOW (ref 150–400)
RBC: 2.64 MIL/uL — ABNORMAL LOW (ref 4.22–5.81)
RDW: 18.6 % — ABNORMAL HIGH (ref 11.5–15.5)
WBC: 5.3 K/uL (ref 4.0–10.5)
nRBC: 0 % (ref 0.0–0.2)

## 2024-04-14 MED ORDER — MEDIHONEY WOUND/BURN DRESSING EX PSTE
1.0000 | PASTE | Freq: Every day | CUTANEOUS | Status: DC
  Administered 2024-04-14 – 2024-04-30 (×15): 1 via TOPICAL
  Filled 2024-04-14 (×2): qty 44

## 2024-04-14 NOTE — Plan of Care (Signed)
  Problem: Education: Goal: Knowledge of General Education information will improve Description: Including pain rating scale, medication(s)/side effects and non-pharmacologic comfort measures Outcome: Progressing   Problem: Health Behavior/Discharge Planning: Goal: Ability to manage health-related needs will improve Outcome: Progressing   Problem: Clinical Measurements: Goal: Will remain free from infection Outcome: Progressing   Problem: Activity: Goal: Risk for activity intolerance will decrease Outcome: Progressing   Problem: Nutrition: Goal: Adequate nutrition will be maintained Outcome: Progressing   Problem: Coping: Goal: Level of anxiety will decrease Outcome: Progressing   Problem: Elimination: Goal: Will not experience complications related to bowel motility Outcome: Progressing   Problem: Safety: Goal: Ability to remain free from injury will improve Outcome: Progressing   

## 2024-04-14 NOTE — Progress Notes (Signed)
 PROGRESS NOTE        PATIENT DETAILS Name: Fred Mcintosh Age: 63 y.o. Sex: male Date of Birth: [birth year] Admit Date: 03/27/2024 Admitting Physician Valinda Novas, MD ERE:Ypoo, Elna, MD  Brief Summary: Patient is a 63 y.o.  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  Admitted to the ICU-orthopedic consulted-underwent left AKA-subsequently extubated-stabilized and transferred to [facility].  Significant events: 9/10>> septic shock-admit to ICU-underwent left AKA 9/11>> extubated. 9/15>> transferred to [facility].  Significant studies: 9/10>> CXR: No active disease. 9/10>> left foot x-ray: There is surrounding TMA stump-as well as lower calf. 9/10>> x-ray left tibia/fibula: Surrounding TMA stump-and lower half of leg. 9/14>> TTE: EF 70-75%, grade 1 diastolic dysfunction 916>> TEE: No vegetation. 9/17>> CT head: No acute abnormality 9/22>> MRI brain: No acute intracranial abnormality.  Significant microbiology data: 9/10>> blood culture: Streptococcus anginosus. 9/12>> blood culture: No growth  Procedures: 9/10>> left AKA 9/16>> TEE  Consults: Orthopedic PCCM Infectious disease  Subjective: No major issues-lying comfortably in bed.  No vomiting.  Objective: Vitals: Blood pressure 118/77, pulse 99, temperature 98.6 F (37 C), temperature source Oral, resp. rate 16, height 6' 2 (1.88 m), weight 119.3 kg, SpO2 97%.   Exam: Awake/alert Clear to auscultation S1-S2 regular Abdomen: Soft nontender Left AKA.  Pertinent Labs/Radiology:    Latest Ref Rng & Units 04/14/2024    4:26 AM 04/09/2024    2:42 AM 04/08/2024    3:01 AM  CBC  WBC 4.0 - 10.5 K/uL 5.3  7.4  8.5   Hemoglobin 13.0 - 17.0 g/dL 7.6  7.7  8.1   Hematocrit 39.0 - 52.0 % 23.7  24.7  26.2   Platelets 150 - 400 K/uL 142  175  192     Lab  Results  Component Value Date   NA 134 (L) 04/14/2024   K 4.1 04/14/2024   CL 103 04/14/2024   CO2 25 04/14/2024      Assessment/Plan: Septic shock secondary to left lower extremity necrotizing fasciitis and streptococcal bacteremia Sepsis physiology has resolved S/p left AKA by Dr. Harden on 9/10 TEE negative for endocarditis IV penicillin  G completed 9/26.  Acute metabolic encephalopathy Secondary to sepsis physiology Neuroimaging negative Improved-awake alert this morning  Vomiting No further vomiting Supportive care with antiemetics.   Normocytic anemia  Likely secondary to critical/acute illness Follow CBC.  Hypothyroidism Synthroid   HTN BP stable Currently not on any antihypertensives  DM-2 (A1c 6.7 on 9/10) CBG stable Lantus  15 units daily+ 4 units 3 times daily with meals+ SSI  Recent Labs    04/12/24 0859 04/12/24 1223 04/12/24 1330  GLUCAP 184* 183* 163*     Bronchial asthma Stable/no wheezing As needed bronchodilators.  Nutrition Status: Nutrition Problem: Increased nutrient needs Etiology: wound healing Signs/Symptoms: estimated needs Interventions: MVI, Juven, Tube feeding    Pressure Ulcer: Wound 03/27/24 Pressure Injury Buttocks Left Stage 3 -  Full thickness tissue loss. Subcutaneous fat may be visible but bone, tendon or muscle are NOT exposed. (Active)     Wound 03/27/24 Pressure Injury Sacrum Stage 3 -  Full thickness tissue loss. Subcutaneous fat may be visible but bone, tendon or muscle are NOT exposed. (Active)     Wound 04/10/24 1758 Pressure Injury Thigh Posterior;Proximal;Right Stage 3 -  Full thickness tissue loss. Subcutaneous fat may be visible but bone, tendon or muscle are NOT exposed. (Active)    Class I obesity Estimated body mass index is 33.77 kg/m as calculated from the following:   Height as of this encounter: 6' 2 (1.88 m).   Weight as of this encounter: 119.3 kg.   Code status:   Code Status: Full Code    DVT Prophylaxis: SCD's Start: 03/27/24 1747 heparin  injection 5,000 Units Start: 03/27/24 1400   Family Communication: None at bedside   Disposition Plan: Status is: Inpatient Remains inpatient appropriate because: Severity of illness   Planned Discharge Destination:Skilled nursing facility-medically stable-awaiting bed   Diet: Diet Order             Diet regular Room service appropriate? No; Fluid consistency: Thin  Diet effective now                     Antimicrobial agents: Anti-infectives (From admission, onward)    Start     Dose/Rate Route Frequency Ordered Stop   04/03/24 1200  penicillin  G potassium 24 Million Units in dextrose  5 % 500 mL CONTINUOUS infusion        24 Million Units 20.8 mL/hr over 24 Hours Intravenous Every 24 hours 04/03/24 1013 04/13/24 0959   03/29/24 1200  cefTRIAXone  (ROCEPHIN ) 2 g in sodium chloride  0.9 % 100 mL IVPB  Status:  Discontinued        2 g 200 mL/hr over 30 Minutes Intravenous Every 24 hours 03/29/24 1037 04/03/24 1012   03/27/24 1400  piperacillin -tazobactam (ZOSYN ) IVPB 3.375 g  Status:  Discontinued        3.375 g 12.5 mL/hr over 240 Minutes Intravenous Every 8 hours 03/27/24 1208 03/29/24 1037   03/27/24 1300  linezolid  (ZYVOX ) IVPB 600 mg  Status:  Discontinued        600 mg 300 mL/hr over 60 Minutes Intravenous Every 12 hours 03/27/24 1208 03/29/24 1037   03/27/24 1215  ceFAZolin  (ANCEF ) IVPB 3g/150 mL premix  Status:  Discontinued        3 g 300 mL/hr over 30 Minutes Intravenous On call to O.R. 03/27/24 1206 03/27/24 1743   03/27/24 0900  linezolid  (ZYVOX ) IVPB 600 mg        600 mg 300 mL/hr over 60 Minutes Intravenous  Once 03/27/24 0837 03/27/24 1057   03/27/24 0845  piperacillin -tazobactam (ZOSYN ) IVPB 3.375 g        3.375 g 100 mL/hr over 30 Minutes Intravenous  Once 03/27/24 0837 03/27/24 0958        MEDICATIONS: Scheduled Meds:  sodium chloride    Intravenous Once   acetaminophen   650 mg Oral Q6H WA    vitamin C   1,000 mg Oral Daily   docusate sodium   100 mg Oral BID   feeding supplement  237 mL Oral 5 X Daily   heparin   5,000 Units Subcutaneous Q8H   insulin  aspart  0-15 Units Subcutaneous TID WC   insulin  aspart  0-5 Units Subcutaneous QHS   insulin  aspart  4 Units Subcutaneous TID WC   insulin  glargine  15 Units Subcutaneous Q24H   leptospermum manuka honey  1 Application Topical Daily   levothyroxine   50 mcg Oral Q0600   mirtazapine   30 mg Oral QHS   pantoprazole   40 mg Oral Daily   polyethylene glycol  17 g Oral BID   Continuous Infusions:   PRN Meds:.albuterol , alum & mag hydroxide-simeth, dextrose , hydrALAZINE , labetalol , methocarbamol  (ROBAXIN )  injection, ondansetron  (ZOFRAN ) IV, mouth rinse, oxyCODONE    I have personally reviewed following labs and imaging studies  LABORATORY DATA: CBC: Recent Labs  Lab 04/08/24 0301 04/09/24 0242 04/14/24 0426  WBC 8.5 7.4 5.3  HGB 8.1* 7.7* 7.6*  HCT 26.2* 24.7* 23.7*  MCV 92.6 91.5 89.8  PLT 192 175 142*    Basic Metabolic Panel: Recent Labs  Lab 04/08/24 0301 04/09/24 0242 04/14/24 0426  NA 141 138 134*  K 5.1 4.8 4.1  CL 109 107 103  CO2 24 23 25   GLUCOSE 210* 136* 157*  BUN 35* 32* 22  CREATININE 1.11 1.20 1.05  CALCIUM  8.5* 8.5* 8.1*  PHOS 3.3 3.3  --     GFR: Estimated Creatinine Clearance: 100.1 mL/min (by C-G formula based on SCr of 1.05 mg/dL).  Liver Function Tests: No results for input(s): AST, ALT, ALKPHOS, BILITOT, PROT, ALBUMIN in the last 168 hours.  No results for input(s): LIPASE, AMYLASE in the last 168 hours. No results for input(s): AMMONIA in the last 168 hours.  Coagulation Profile: No results for input(s): INR, PROTIME in the last 168 hours.  Cardiac Enzymes: No results for input(s): CKTOTAL, CKMB, CKMBINDEX, TROPONINI in the last 168 hours.  BNP (last 3 results) No results for input(s): PROBNP in the last 8760 hours.  Lipid Profile: No  results for input(s): CHOL, HDL, LDLCALC, TRIG, CHOLHDL, LDLDIRECT in the last 72 hours.  Thyroid  Function Tests: No results for input(s): TSH, T4TOTAL, FREET4, T3FREE, THYROIDAB in the last 72 hours.  Anemia Panel: No results for input(s): VITAMINB12, FOLATE, FERRITIN, TIBC, IRON, RETICCTPCT in the last 72 hours.   Urine analysis:    Component Value Date/Time   COLORURINE AMBER (A) 03/27/2024 0934   APPEARANCEUR HAZY (A) 03/27/2024 0934   LABSPEC 1.016 03/27/2024 0934   PHURINE 5.0 03/27/2024 0934   GLUCOSEU >=500 (A) 03/27/2024 0934   HGBUR LARGE (A) 03/27/2024 0934   BILIRUBINUR NEGATIVE 03/27/2024 0934   KETONESUR NEGATIVE 03/27/2024 0934   PROTEINUR NEGATIVE 03/27/2024 0934   NITRITE NEGATIVE 03/27/2024 0934   LEUKOCYTESUR TRACE (A) 03/27/2024 0934    Sepsis Labs: Lactic Acid, Venous    Component Value Date/Time   LATICACIDVEN 7.6 (HH) 03/27/2024 1529    MICROBIOLOGY: No results found for this or any previous visit (from the past 240 hours).  RADIOLOGY STUDIES/RESULTS: No results found.    LOS: 18 days   Donalda Applebaum, MD  [facility]    To contact the attending provider between 7A-7P or the covering provider during after hours 7P-7A, please log into the web site [email or web address] and access using universal [place] password for that web site. If you do not have the password, please call the hospital operator.  04/14/2024, 10:03 AM

## 2024-04-15 DIAGNOSIS — R6521 Severe sepsis with septic shock: Secondary | ICD-10-CM | POA: Diagnosis not present

## 2024-04-15 DIAGNOSIS — M726 Necrotizing fasciitis: Secondary | ICD-10-CM | POA: Diagnosis not present

## 2024-04-15 DIAGNOSIS — B955 Unspecified streptococcus as the cause of diseases classified elsewhere: Secondary | ICD-10-CM | POA: Diagnosis not present

## 2024-04-15 DIAGNOSIS — R7881 Bacteremia: Secondary | ICD-10-CM | POA: Diagnosis not present

## 2024-04-15 LAB — GLUCOSE, CAPILLARY
Glucose-Capillary: 127 mg/dL — ABNORMAL HIGH (ref 70–99)
Glucose-Capillary: 262 mg/dL — ABNORMAL HIGH (ref 70–99)
Glucose-Capillary: 206 mg/dL — ABNORMAL HIGH (ref 70–99)
Glucose-Capillary: 205 mg/dL — ABNORMAL HIGH (ref 70–99)
Glucose-Capillary: 74 mg/dL (ref 70–99)
Glucose-Capillary: 140 mg/dL — ABNORMAL HIGH (ref 70–99)
Glucose-Capillary: 166 mg/dL — ABNORMAL HIGH (ref 70–99)
Glucose-Capillary: 179 mg/dL — ABNORMAL HIGH (ref 70–99)
Glucose-Capillary: 127 mg/dL — ABNORMAL HIGH (ref 70–99)
Glucose-Capillary: 133 mg/dL — ABNORMAL HIGH (ref 70–99)
Glucose-Capillary: 165 mg/dL — ABNORMAL HIGH (ref 70–99)
Glucose-Capillary: 192 mg/dL — ABNORMAL HIGH (ref 70–99)

## 2024-04-15 NOTE — Progress Notes (Signed)
 Physical Therapy Treatment Patient Details Name: Fred Mcintosh MRN: [id number] DOB: November 12, 1960 Today's Date: 04/15/2024   History of Present Illness 63 y.o. 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 Focused on lateral scoot transfer from EOB to drop arm recliner and minAx2. Pt reported feeling better with lateral scoot transfer as opposed to attempting to stand pvt. Discussed at length phantom limb pain and ways to desensitize L residual limb. Acute PT to cont to follow. Pt to benefit from inpatient rehab program < 3 hrs a day to allow for increased time to achieve safe mod I w/c level of function prior to transition home. Acute PT to cont to follow.   If plan is discharge home, recommend the following: Two people to help with walking and/or transfers;Two people to help with bathing/dressing/bathroom;Direct supervision/assist for medications management;Assistance with feeding;Assistance with cooking/housework;Direct supervision/assist for financial management;Supervision due to cognitive status;Assist for transportation;Help with stairs or ramp for entrance   Can travel by private vehicle     No  Equipment Recommendations  Wheelchair (measurements PT);Wheelchair cushion (measurements PT);BSC/3in1;Hospital bed;Hoyer lift    Recommendations for Other Services OT consult     Precautions / Restrictions Precautions Precautions: Fall Recall of Precautions/Restrictions: Impaired Precaution/Restrictions Comments: Lt AKA, sacral wound Restrictions Weight Bearing Restrictions Per Provider Order: Yes LLE Weight Bearing Per Provider Order: Non weight bearing     Mobility  Bed Mobility Overal bed mobility: Needs Assistance Bed  Mobility: Rolling, Supine to Sit Rolling: Mod assist   Supine to sit: Mod assist, +2 for safety/equipment     General bed mobility comments: verbal directional cues to logroll to the R, modA for trunk elevation, pushed/pulled up with L UE    Transfers Overall transfer level: Needs assistance Equipment used: None Transfers: Bed to chair/wheelchair/BSC            Lateral/Scoot Transfers: Min assist, +2 physical assistance General transfer comment: PT and rehab tech used bed pad under patient to assist with lateral scoot transfer minimally, pt able to use bilat UEs and R LE to clear buttocks and perform lateral scoots to chair, ,minA x2 to clear space between matress and drop arm recliner    Ambulation/Gait               General Gait Details: unable   Stairs             Wheelchair Mobility     Tilt Bed    Modified Rankin (Stroke Patients Only)       Balance Overall balance assessment: Needs assistance Sitting-balance support: Feet supported, No upper extremity supported Sitting balance-Leahy Scale: Fair Sitting balance - Comments: static sitting CGA                                    Communication Communication Communication: No apparent difficulties Factors Affecting Communication: Reduced clarity of speech  Cognition Arousal: Alert Behavior During Therapy: Flat affect                           PT - Cognition Comments: pt with depressed spirits but actively participated with  encouragement from therapist and mother Following commands: Intact Following commands impaired: Follows one step commands inconsistently, Follows one step commands with increased time    Cueing Cueing Techniques: Verbal cues, Visual cues, Tactile cues, Gestural cues  Exercises General Exercises - Lower Extremity Quad Sets: AROM, Right, 10 reps, Supine Gluteal Sets: AROM, Both, 10 reps, Supine Long Arc Quad: Right, Seated, 10 reps, Strengthening,  AROM    General Comments General comments (skin integrity, edema, etc.): VSS      Pertinent Vitals/Pain Pain Assessment Pain Assessment: Faces Faces Pain Scale: Hurts little more Consolability: no need to console Pain Location: LLE phantom limb pain Pain Descriptors / Indicators: Grimacing, Guarding Pain Intervention(s): Monitored during session    Home Living                          Prior Function            PT Goals (current goals can now be found in the care plan section) Acute Rehab PT Goals Patient Stated Goal: Return Home PT Goal Formulation: Patient unable to participate in goal setting Time For Goal Achievement: 04/29/24 Potential to Achieve Goals: Good Progress towards PT goals: Progressing toward goals    Frequency    Min 2X/week      PT Plan      Co-evaluation              AM-PAC PT 6 Clicks Mobility   Outcome Measure  Help needed turning from your back to your side while in a flat bed without using bedrails?: A Lot Help needed moving from lying on your back to sitting on the side of a flat bed without using bedrails?: A Lot Help needed moving to and from a bed to a chair (including a wheelchair)?: A Lot Help needed standing up from a chair using your arms (e.g., wheelchair or bedside chair)?: Total Help needed to walk in hospital room?: Total Help needed climbing 3-5 steps with a railing? : Total 6 Click Score: 9    End of Session Equipment Utilized During Treatment: Gait belt Activity Tolerance: Patient tolerated treatment well Patient left: in chair;with call bell/phone within reach;with chair alarm set Nurse Communication: Mobility status;Need for lift equipment PT Visit Diagnosis: Difficulty in walking, not elsewhere classified (R26.2);Unsteadiness on feet (R26.81);Other abnormalities of gait and mobility (R26.89);Muscle weakness (generalized) (M62.81)     Time: 8882-8858 PT Time Calculation (min) (ACUTE ONLY): 24  min  Charges:    $Therapeutic Exercise: 8-22 mins $Therapeutic Activity: 8-22 mins PT General Charges $$ ACUTE PT VISIT: 1 Visit                     Norene Ames, PT, DPT Acute Rehabilitation Services Secure chat preferred Office #: [phone]    Norene CHRISTELLA Ames 04/15/2024, 2:49 PM

## 2024-04-15 NOTE — Plan of Care (Signed)
  Problem: Education: Goal: Knowledge of General Education information will improve Description: Including pain rating scale, medication(s)/side effects and non-pharmacologic comfort measures Outcome: Progressing   Problem: Health Behavior/Discharge Planning: Goal: Ability to manage health-related needs will improve Outcome: Progressing   Problem: Clinical Measurements: Goal: Ability to maintain clinical measurements within normal limits will improve Outcome: Progressing Goal: Will remain free from infection Outcome: Progressing Goal: Diagnostic test results will improve Outcome: Progressing Goal: Respiratory complications will improve Outcome: Progressing Goal: Cardiovascular complication will be avoided Outcome: Progressing   Problem: Activity: Goal: Risk for activity intolerance will decrease Outcome: Progressing   Problem: Nutrition: Goal: Adequate nutrition will be maintained Outcome: Progressing   Problem: Coping: Goal: Level of anxiety will decrease Outcome: Progressing   Problem: Elimination: Goal: Will not experience complications related to bowel motility Outcome: Progressing Goal: Will not experience complications related to urinary retention Outcome: Progressing   Problem: Pain Managment: Goal: General experience of comfort will improve and/or be controlled Outcome: Progressing   Problem: Safety: Goal: Ability to remain free from injury will improve Outcome: Progressing   Problem: Skin Integrity: Goal: Risk for impaired skin integrity will decrease Outcome: Progressing   Problem: Education: Goal: Ability to describe self-care measures that may prevent or decrease complications (Diabetes Survival Skills Education) will improve Outcome: Progressing Goal: Individualized Educational Video(s) Outcome: Progressing   Problem: Coping: Goal: Ability to adjust to condition or change in health will improve Outcome: Progressing   Problem: Fluid  Volume: Goal: Ability to maintain a balanced intake and output will improve Outcome: Progressing   Problem: Health Behavior/Discharge Planning: Goal: Ability to identify and utilize available resources and services will improve Outcome: Progressing Goal: Ability to manage health-related needs will improve Outcome: Progressing   Problem: Metabolic: Goal: Ability to maintain appropriate glucose levels will improve Outcome: Progressing   Problem: Nutritional: Goal: Maintenance of adequate nutrition will improve Outcome: Progressing Goal: Progress toward achieving an optimal weight will improve Outcome: Progressing   Problem: Skin Integrity: Goal: Risk for impaired skin integrity will decrease Outcome: Progressing   Problem: Tissue Perfusion: Goal: Adequacy of tissue perfusion will improve Outcome: Progressing   Problem: Education: Goal: Knowledge of the prescribed therapeutic regimen will improve Outcome: Progressing Goal: Ability to verbalize activity precautions or restrictions will improve Outcome: Progressing Goal: Understanding of discharge needs will improve Outcome: Progressing   Problem: Activity: Goal: Ability to perform//tolerate increased activity and mobilize with assistive devices will improve Outcome: Progressing   Problem: Clinical Measurements: Goal: Postoperative complications will be avoided or minimized Outcome: Progressing   Problem: Self-Care: Goal: Ability to meet self-care needs will improve Outcome: Progressing   Problem: Self-Concept: Goal: Ability to maintain and perform role responsibilities to the fullest extent possible will improve Outcome: Progressing   Problem: Pain Management: Goal: Pain level will decrease with appropriate interventions Outcome: Progressing   Problem: Skin Integrity: Goal: Demonstration of wound healing without infection will improve Outcome: Progressing   Problem: Activity: Goal: Ability to tolerate  increased activity will improve Outcome: Progressing   Problem: Respiratory: Goal: Ability to maintain a clear airway and adequate ventilation will improve Outcome: Progressing   Problem: Role Relationship: Goal: Method of communication will improve Outcome: Progressing   Problem: Safety: Goal: Non-violent Restraint(s) Outcome: Progressing

## 2024-04-15 NOTE — TOC Progression Note (Signed)
 Transition of Care [facility]) - Progression Note    Patient Details  Name: Fred Mcintosh MRN: [id number] Date of Birth: 12-15-1960  [facility] CM/SW Contact  [name], [place] Phone Number: 04/15/2024, 11:47 AM  Clinical Narrative:   CSW left voicemail for Luke with [employer] [place]  : [phone]) to confirm patient only has dental benefits.   CSW spoke with Roanne with [facility] APS [phone]). Roanne stated APS will likely close case this week as patients orientation has improved and patient seems to be able to create a safe environment for himself. Roanne stated if CSW does need any other assistance for connecting patient to resources CSW can reach out.   CSW will continue to follow.   Expected Discharge Plan: Skilled Nursing Facility Barriers to Discharge: Insurance Authorization, SNF Pending bed offer, Continued Medical Work up               Expected Discharge Plan and Services In-house Referral: Clinical Social Work   Post Acute Care Choice: Skilled Nursing Facility Living arrangements for the past 2 months: Single Family Home                                       Social Drivers of Health (SDOH) Interventions SDOH Screenings   Food Insecurity: No Food Insecurity (03/08/2024)  Housing: Low Risk  (03/08/2024)  Transportation Needs: No Transportation Needs (03/08/2024)  Utilities: Not At Risk (03/08/2024)  Social Connections: Moderately Integrated (01/18/2024)  Tobacco Use: Unknown (03/27/2024)    Readmission Risk Interventions     No data to display

## 2024-04-15 NOTE — Progress Notes (Addendum)
 Nutrition Follow-up  DOCUMENTATION CODES:   Not applicable  INTERVENTION:  Per MD, vitamin C  1000 mg x 30 days  Continue Ensure Plus High Protein po 5x daily, each supplement provides 350 kcal and 20 grams of protein  Continue Magic cup TID with meals, each supplement provides 290 kcal and 9 grams of protein  Continue appetite stimulant  NUTRITION DIAGNOSIS:  Increased nutrient needs related to wound healing as evidenced by estimated needs. - remains applicable  GOAL:  Patient will meet greater than or equal to 90% of their needs - progressing  MONITOR:  Diet advancement, I & O's, Labs, Skin  REASON FOR ASSESSMENT:  Ventilator, Consult Enteral/tube feeding initiation and management  ASSESSMENT:   Pt with hx of HTN, DM type 2, grave's disease, and recent transmetatarsal amputation presented to ED with AMS and weakness. Family reports pt not getting out of bed x 9 days. In ED, left foot noted to be dehisced with exposed bone, foul drainage and subcutaneous emphysema up the shin.  9/10 - presented to ED, Op, left AKA with wound vac placement  9/11 - TF started; extubated 9/12 - Cortrak placed and TF re-initiated 9/14 - Echocardiogram w/ bubble study: Left ventricular  hypertrophy; EF 70-75%; aortic valve sclerosis/calcification 9/15 - THR assumed care 9/16 - TEE; patient pulled Cortrak 9/17 - calorie count started 9/19 - calorie count ended: insufficient; refused replacement of Cortrak; another calorie count initiated 9/21 - calorie count completed: insufficient; appetite stimulant up titrated  9/22 - MRI: no acute intracranial abnormality 9/26 - IV ABX course complete   Patient mentation improving. Family at bedside today. He is medically stable and awaiting SNF placement. Difficult placement d/t insurance.  Average Meal Intake No recent documentation to review   Some vomiting noted on 9/23. None since. RN reports he is drinking Ensure supplements. She encouraged him to  order breakfast this morning. He ordered two boiled eggs and toast. Unsure of how much he consumed.   Of note, five Ensures per day meets 73% of estimated calorie needs and 83% of estimated protein needs. Discussed importance of meeting calorie and protein needs consistently to encourage wound healing, as he now has new stage three to pressure injury to L thigh.   Current deficit: 700 kcals, 20g protein  Discussed these findings with patient and encouraged him to try to meet protein needs via food sources, at the very least. He verbalized understanding. To date, he has tried and refused [employer], Juven, and [employer]. Continue to speak with him regarding Cortrak to which he is not amicable and states, I will pull it out.   Admit weight: 128.5 kg (first measured s/p AKA)  Current weight: 119.3 kg  +generalized non-pitting edema   Significant weight loss this admission taking into account AKA. Likely somewhat related to fluid shifts and resolution of edema.  Edema has improved. Therefore, cannot likely use as a criteria for malnutrition even though it is likely he is appropriate. Large body habitus likely hiding more significant muscle and fat depletions.      Intake/Output Summary (Last 24 hours) at 04/15/2024 1508 Last data filed at 04/15/2024 1329 Gross per 24 hour  Intake --  Output 1575 ml  Net -1575 ml      Drains/Lines: Foley catheter UOP x 24 hours   Remains on vitamin/mineral regimen to promote wound healing. He was refusing Juven. Appetite stimulant started 9/19 and continues. Insulin  regimen continues to be adjusted. Elevated blood sugars, however better controlled than  on admission, which can also impact wound healing. Crt/BUN has stabilized and within desirable range as well as WBCs. Completed ordered course of zinc  supplementation.   Meds: ascorbic acid , docusate sodium , SS Novolog  0-15 TID, SS Novolog  0-5 QHS, SS Novolog  4 TID, Lantus  15 daily, levothyroxine ,  mirtazapine , pantoprazole , Miralax   Labs from 9/28 reviewed:  Na+ 134 (L) K+ 4.1 (wdl) BUN 22 (wdl) WBC 5.3 (wdl) TSH 7.797 (9/10) T3, free 1.4 (9/11) CBGs 136-157 x1 week A1c 6.7 (03/2024)   Diet Order:   Diet Order             Diet regular Room service appropriate? No; Fluid consistency: Thin  Diet effective now            EDUCATION NEEDS:  Not appropriate for education at this time  Skin:  Skin Assessment: Skin Integrity Issues: Skin Integrity Issues:: Stage III, Incisions Stage III: R buttocks/sacrum, L thigh (new) Incisions: closed surgical incision L AKA  Last BM:  9/29 - type 5 x1  Height:  Ht Readings from Last 1 Encounters:  03/27/24 6' 2 (1.88 m)   Weight:  Wt Readings from Last 1 Encounters:  04/15/24 (P) 117.1 kg   Ideal Body Weight:  77.7 kg (adjusted by 10% for AKA)  BMI:  Body mass index is 33.15 kg/m (pended).  Estimated Nutritional Needs:   Kcal:  2400-2600 kcal/d  Protein:  120-145 g/d  Fluid:  2.5L/d  Blair Deaner MS, RD, LDN Registered Dietitian Clinical Nutrition RD Inpatient Contact Info in Amion

## 2024-04-15 NOTE — Plan of Care (Signed)
  Problem: Education: Goal: Knowledge of General Education information will improve Description: Including pain rating scale, medication(s)/side effects and non-pharmacologic comfort measures Outcome: Progressing   Problem: Clinical Measurements: Goal: Ability to maintain clinical measurements within normal limits will improve Outcome: Progressing Goal: Will remain free from infection Outcome: Progressing Goal: Diagnostic test results will improve Outcome: Progressing   Problem: Activity: Goal: Risk for activity intolerance will decrease Outcome: Progressing   Problem: Nutrition: Goal: Adequate nutrition will be maintained Outcome: Progressing   Problem: Coping: Goal: Level of anxiety will decrease Outcome: Progressing   Problem: Safety: Goal: Ability to remain free from injury will improve Outcome: Progressing

## 2024-04-15 NOTE — Progress Notes (Signed)
 PROGRESS NOTE        PATIENT DETAILS Name: Fred Mcintosh Age: 63 y.o. Sex: male Date of Birth: May 09, 1961 Admit Date: 03/27/2024 Admitting Physician Valinda Novas, MD ERE:Ypoo, Elna, MD  Brief Summary: Patient is a 63 y.o.  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  Admitted to the ICU-orthopedic consulted-underwent left AKA-subsequently extubated-stabilized and transferred to [facility].  Significant events: 9/10>> septic shock-admit to ICU-underwent left AKA 9/11>> extubated. 9/15>> transferred to [facility].  Significant studies: 9/10>> CXR: No active disease. 9/10>> left foot x-ray: There is surrounding TMA stump-as well as lower calf. 9/10>> x-ray left tibia/fibula: Surrounding TMA stump-and lower half of leg. 9/14>> TTE: EF 70-75%, grade 1 diastolic dysfunction 916>> TEE: No vegetation. 9/17>> CT head: No acute abnormality 9/22>> MRI brain: No acute intracranial abnormality.  Significant microbiology data: 9/10>> blood culture: Streptococcus anginosus. 9/12>> blood culture: No growth  Procedures: 9/10>> left AKA 9/16>> TEE  Consults: Orthopedic PCCM Infectious disease  Subjective: No major issues overnight-no vomiting-awaiting SNF.  Objective: Vitals: Blood pressure 123/85, pulse 97, temperature 98.8 F (37.1 C), temperature source Oral, resp. rate 16, height 6' 2 (1.88 m), weight (P) 117.1 kg, SpO2 98%.   Exam: Awake/alert Clear to auscultation S1-S2 regular Abdomen: Soft nontender Left AKA.  Pertinent Labs/Radiology:    Latest Ref Rng & Units 04/14/2024    4:26 AM 04/09/2024    2:42 AM 04/08/2024    3:01 AM  CBC  WBC 4.0 - 10.5 K/uL 5.3  7.4  8.5   Hemoglobin 13.0 - 17.0 g/dL 7.6  7.7  8.1   Hematocrit 39.0 - 52.0 % 23.7  24.7  26.2   Platelets 150 - 400 K/uL 142  175  192     Lab  Results  Component Value Date   NA 134 (L) 04/14/2024   K 4.1 04/14/2024   CL 103 04/14/2024   CO2 25 04/14/2024      Assessment/Plan: Septic shock secondary to left lower extremity necrotizing fasciitis and streptococcal bacteremia Sepsis physiology has resolved S/p left AKA by Dr. Harden on 9/10 TEE negative for endocarditis IV penicillin  G completed 9/26.  Acute metabolic encephalopathy Secondary to sepsis physiology Neuroimaging negative Improved-awake alert this morning  Vomiting No further vomiting Supportive care with antiemetics.   Normocytic anemia  Likely secondary to critical/acute illness Follow CBC.  Hypothyroidism Synthroid   HTN BP stable Currently not on any antihypertensives  DM-2 (A1c 6.7 on 9/10) CBG stable Lantus  15 units daily+ 4 units 3 times daily with meals+ SSI  Recent Labs    04/12/24 1223 04/12/24 1330  GLUCAP 183* 163*     Bronchial asthma Stable/no wheezing As needed bronchodilators.  Nutrition Status: Nutrition Problem: Increased nutrient needs Etiology: wound healing Signs/Symptoms: estimated needs Interventions: MVI, Juven, Tube feeding    Pressure Ulcer: Wound 03/27/24 Pressure Injury Buttocks Left Stage 3 -  Full thickness tissue loss. Subcutaneous fat may be visible but bone, tendon or muscle are NOT exposed. (Active)     Wound 03/27/24 Pressure Injury Sacrum Stage 3 -  Full thickness tissue loss. Subcutaneous fat may be visible but bone, tendon or muscle are NOT exposed. (Active)     Wound 04/10/24 1758 Pressure Injury Thigh Posterior;Proximal;Right Stage 3 -  Full thickness tissue loss.  Subcutaneous fat may be visible but bone, tendon or muscle are NOT exposed. (Active)    Class I obesity Estimated body mass index is 33.15 kg/m (pended) as calculated from the following:   Height as of this encounter: 6' 2 (1.88 m).   Weight as of this encounter: (P) 117.1 kg.   Code status:   Code Status: Full Code   DVT  Prophylaxis: SCD's Start: 03/27/24 1747 heparin  injection 5,000 Units Start: 03/27/24 1400   Family Communication: None at bedside   Disposition Plan: Status is: Inpatient Remains inpatient appropriate because: Severity of illness   Planned Discharge Destination:Skilled nursing facility-medically stable-awaiting bed   Diet: Diet Order             Diet regular Room service appropriate? No; Fluid consistency: Thin  Diet effective now                     Antimicrobial agents: Anti-infectives (From admission, onward)    Start     Dose/Rate Route Frequency Ordered Stop   04/03/24 1200  penicillin  G potassium 24 Million Units in dextrose  5 % 500 mL CONTINUOUS infusion        24 Million Units 20.8 mL/hr over 24 Hours Intravenous Every 24 hours 04/03/24 1013 04/13/24 0959   03/29/24 1200  cefTRIAXone  (ROCEPHIN ) 2 g in sodium chloride  0.9 % 100 mL IVPB  Status:  Discontinued        2 g 200 mL/hr over 30 Minutes Intravenous Every 24 hours 03/29/24 1037 04/03/24 1012   03/27/24 1400  piperacillin -tazobactam (ZOSYN ) IVPB 3.375 g  Status:  Discontinued        3.375 g 12.5 mL/hr over 240 Minutes Intravenous Every 8 hours 03/27/24 1208 03/29/24 1037   03/27/24 1300  linezolid  (ZYVOX ) IVPB 600 mg  Status:  Discontinued        600 mg 300 mL/hr over 60 Minutes Intravenous Every 12 hours 03/27/24 1208 03/29/24 1037   03/27/24 1215  ceFAZolin  (ANCEF ) IVPB 3g/150 mL premix  Status:  Discontinued        3 g 300 mL/hr over 30 Minutes Intravenous On call to O.R. 03/27/24 1206 03/27/24 1743   03/27/24 0900  linezolid  (ZYVOX ) IVPB 600 mg        600 mg 300 mL/hr over 60 Minutes Intravenous  Once 03/27/24 0837 03/27/24 1057   03/27/24 0845  piperacillin -tazobactam (ZOSYN ) IVPB 3.375 g        3.375 g 100 mL/hr over 30 Minutes Intravenous  Once 03/27/24 0837 03/27/24 0958        MEDICATIONS: Scheduled Meds:  sodium chloride    Intravenous Once   acetaminophen   650 mg Oral Q6H WA    vitamin C   1,000 mg Oral Daily   docusate sodium   100 mg Oral BID   feeding supplement  237 mL Oral 5 X Daily   heparin   5,000 Units Subcutaneous Q8H   insulin  aspart  0-15 Units Subcutaneous TID WC   insulin  aspart  0-5 Units Subcutaneous QHS   insulin  aspart  4 Units Subcutaneous TID WC   insulin  glargine  15 Units Subcutaneous Q24H   leptospermum manuka honey  1 Application Topical Daily   levothyroxine   50 mcg Oral Q0600   mirtazapine   30 mg Oral QHS   pantoprazole   40 mg Oral Daily   polyethylene glycol  17 g Oral BID   Continuous Infusions:   PRN Meds:.albuterol , alum & mag hydroxide-simeth, dextrose , hydrALAZINE , labetalol , methocarbamol  (ROBAXIN ) injection, ondansetron  (  ZOFRAN ) IV, mouth rinse, oxyCODONE    I have personally reviewed following labs and imaging studies  LABORATORY DATA: CBC: Recent Labs  Lab 04/09/24 0242 04/14/24 0426  WBC 7.4 5.3  HGB 7.7* 7.6*  HCT 24.7* 23.7*  MCV 91.5 89.8  PLT 175 142*    Basic Metabolic Panel: Recent Labs  Lab 04/09/24 0242 04/14/24 0426  NA 138 134*  K 4.8 4.1  CL 107 103  CO2 23 25  GLUCOSE 136* 157*  BUN 32* 22  CREATININE 1.20 1.05  CALCIUM  8.5* 8.1*  PHOS 3.3  --     GFR: Estimated Creatinine Clearance: 100.1 mL/min (by C-G formula based on SCr of 1.05 mg/dL).  Liver Function Tests: No results for input(s): AST, ALT, ALKPHOS, BILITOT, PROT, ALBUMIN in the last 168 hours.  No results for input(s): LIPASE, AMYLASE in the last 168 hours. No results for input(s): AMMONIA in the last 168 hours.  Coagulation Profile: No results for input(s): INR, PROTIME in the last 168 hours.  Cardiac Enzymes: No results for input(s): CKTOTAL, CKMB, CKMBINDEX, TROPONINI in the last 168 hours.  BNP (last 3 results) No results for input(s): PROBNP in the last 8760 hours.  Lipid Profile: No results for input(s): CHOL, HDL, LDLCALC, TRIG, CHOLHDL, LDLDIRECT in the last 72  hours.  Thyroid  Function Tests: No results for input(s): TSH, T4TOTAL, FREET4, T3FREE, THYROIDAB in the last 72 hours.  Anemia Panel: No results for input(s): VITAMINB12, FOLATE, FERRITIN, TIBC, IRON, RETICCTPCT in the last 72 hours.   Urine analysis:    Component Value Date/Time   COLORURINE AMBER (A) 03/27/2024 0934   APPEARANCEUR HAZY (A) 03/27/2024 0934   LABSPEC 1.016 03/27/2024 0934   PHURINE 5.0 03/27/2024 0934   GLUCOSEU >=500 (A) 03/27/2024 0934   HGBUR LARGE (A) 03/27/2024 0934   BILIRUBINUR NEGATIVE 03/27/2024 0934   KETONESUR NEGATIVE 03/27/2024 0934   PROTEINUR NEGATIVE 03/27/2024 0934   NITRITE NEGATIVE 03/27/2024 0934   LEUKOCYTESUR TRACE (A) 03/27/2024 0934    Sepsis Labs: Lactic Acid, Venous    Component Value Date/Time   LATICACIDVEN 7.6 (HH) 03/27/2024 1529    MICROBIOLOGY: No results found for this or any previous visit (from the past 240 hours).  RADIOLOGY STUDIES/RESULTS: No results found.    LOS: 19 days   Donalda Applebaum, MD  [facility]    To contact the attending provider between 7A-7P or the covering provider during after hours 7P-7A, please log into the web site [email or web address] and access using universal [place] password for that web site. If you do not have the password, please call the hospital operator.  04/15/2024, 10:06 AM

## 2024-04-16 DIAGNOSIS — B955 Unspecified streptococcus as the cause of diseases classified elsewhere: Secondary | ICD-10-CM | POA: Diagnosis not present

## 2024-04-16 DIAGNOSIS — R7881 Bacteremia: Secondary | ICD-10-CM | POA: Diagnosis not present

## 2024-04-16 DIAGNOSIS — M726 Necrotizing fasciitis: Secondary | ICD-10-CM | POA: Diagnosis not present

## 2024-04-16 DIAGNOSIS — R6521 Severe sepsis with septic shock: Secondary | ICD-10-CM | POA: Diagnosis not present

## 2024-04-16 LAB — GLUCOSE, CAPILLARY
Glucose-Capillary: 130 mg/dL — ABNORMAL HIGH (ref 70–99)
Glucose-Capillary: 102 mg/dL — ABNORMAL HIGH (ref 70–99)
Glucose-Capillary: 131 mg/dL — ABNORMAL HIGH (ref 70–99)
Glucose-Capillary: 131 mg/dL — ABNORMAL HIGH (ref 70–99)
Glucose-Capillary: 219 mg/dL — ABNORMAL HIGH (ref 70–99)

## 2024-04-16 NOTE — Progress Notes (Signed)
 PROGRESS NOTE        PATIENT DETAILS Name: Fred Mcintosh Age: 63 y.o. Sex: male Date of Birth: Sep 13, 1960 Admit Date: 03/27/2024 Admitting Physician Valinda Novas, MD ERE:Ypoo, Elna, MD  Brief Summary: Patient is a 63 y.o.  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  Admitted to the ICU-orthopedic consulted-underwent left AKA-subsequently extubated-stabilized and transferred to [facility].  Significant events: 9/10>> septic shock-admit to ICU-underwent left AKA 9/11>> extubated. 9/15>> transferred to [facility].  Significant studies: 9/10>> CXR: No active disease. 9/10>> left foot x-ray: There is surrounding TMA stump-as well as lower calf. 9/10>> x-ray left tibia/fibula: Surrounding TMA stump-and lower half of leg. 9/14>> TTE: EF 70-75%, grade 1 diastolic dysfunction 916>> TEE: No vegetation. 9/17>> CT head: No acute abnormality 9/22>> MRI brain: No acute intracranial abnormality.  Significant microbiology data: 9/10>> blood culture: Streptococcus anginosus. 9/12>> blood culture: No growth  Procedures: 9/10>> left AKA 9/16>> TEE  Consults: Orthopedic PCCM Infectious disease  Subjective: Lying in Therabid bed-no nausea or vomiting-no shortness of breath-awaiting SNF bed  Objective: Vitals: Blood pressure 106/65, pulse 96, temperature 98.4 F (36.9 C), temperature source Oral, resp. rate 16, height 6' 2 (1.88 m), weight (P) 117.1 kg, SpO2 98%.   Exam: Awake/alert Clear to auscultation S1-S2 regular Abdomen: Soft nontender Left AKA.  Pertinent Labs/Radiology:    Latest Ref Rng & Units 04/14/2024    4:26 AM 04/09/2024    2:42 AM 04/08/2024    3:01 AM  CBC  WBC 4.0 - 10.5 K/uL 5.3  7.4  8.5   Hemoglobin 13.0 - 17.0 g/dL 7.6  7.7  8.1   Hematocrit 39.0 - 52.0 % 23.7  24.7  26.2   Platelets 150 - 400  K/uL 142  175  192     Lab Results  Component Value Date   NA 134 (L) 04/14/2024   K 4.1 04/14/2024   CL 103 04/14/2024   CO2 25 04/14/2024      Assessment/Plan: Septic shock secondary to left lower extremity necrotizing fasciitis and streptococcal bacteremia Sepsis physiology has resolved S/p left AKA by Dr. Harden on 9/10 TEE negative for endocarditis IV penicillin  G completed 9/26.  Acute metabolic encephalopathy Secondary to sepsis physiology Neuroimaging negative Improved-awake alert this morning  Vomiting No further vomiting Supportive care with antiemetics.   Normocytic anemia  Likely secondary to critical/acute illness Follow CBC.  Hypothyroidism Synthroid   HTN BP stable Currently not on any antihypertensives  DM-2 (A1c 6.7 on 9/10) CBG stable Lantus  15 units daily+ 4 units 3 times daily with meals+ SSI  Recent Labs    04/14/24 2157 04/15/24 0851 04/15/24 1227  GLUCAP 133* 165* 192*     Bronchial asthma Stable/no wheezing As needed bronchodilators.  Nutrition Status: Nutrition Problem: Increased nutrient needs Etiology: wound healing Signs/Symptoms: estimated needs Interventions: MVI, Juven, Tube feeding    Pressure Ulcer: Wound 03/27/24 Pressure Injury Buttocks Left Stage 3 -  Full thickness tissue loss. Subcutaneous fat may be visible but bone, tendon or muscle are NOT exposed. (Active)     Wound 03/27/24 Pressure Injury Sacrum Stage 3 -  Full thickness tissue loss. Subcutaneous fat may be visible but bone, tendon or muscle are NOT exposed. (Active)     Wound 04/10/24 1758 Pressure Injury Thigh  Posterior;Proximal;Right Stage 3 -  Full thickness tissue loss. Subcutaneous fat may be visible but bone, tendon or muscle are NOT exposed. (Active)    Class I obesity Estimated body mass index is 33.15 kg/m (pended) as calculated from the following:   Height as of this encounter: 6' 2 (1.88 m).   Weight as of this encounter: (P) 117.1 kg.    Code status:   Code Status: Full Code   DVT Prophylaxis: SCD's Start: 03/27/24 1747 heparin  injection 5,000 Units Start: 03/27/24 1400   Family Communication: None at bedside   Disposition Plan: Status is: Inpatient Remains inpatient appropriate because: Severity of illness   Planned Discharge Destination:Skilled nursing facility-medically stable-awaiting bed   Diet: Diet Order             Diet regular Room service appropriate? No; Fluid consistency: Thin  Diet effective now                     Antimicrobial agents: Anti-infectives (From admission, onward)    Start     Dose/Rate Route Frequency Ordered Stop   04/03/24 1200  penicillin  G potassium 24 Million Units in dextrose  5 % 500 mL CONTINUOUS infusion        24 Million Units 20.8 mL/hr over 24 Hours Intravenous Every 24 hours 04/03/24 1013 04/13/24 0959   03/29/24 1200  cefTRIAXone  (ROCEPHIN ) 2 g in sodium chloride  0.9 % 100 mL IVPB  Status:  Discontinued        2 g 200 mL/hr over 30 Minutes Intravenous Every 24 hours 03/29/24 1037 04/03/24 1012   03/27/24 1400  piperacillin -tazobactam (ZOSYN ) IVPB 3.375 g  Status:  Discontinued        3.375 g 12.5 mL/hr over 240 Minutes Intravenous Every 8 hours 03/27/24 1208 03/29/24 1037   03/27/24 1300  linezolid  (ZYVOX ) IVPB 600 mg  Status:  Discontinued        600 mg 300 mL/hr over 60 Minutes Intravenous Every 12 hours 03/27/24 1208 03/29/24 1037   03/27/24 1215  ceFAZolin  (ANCEF ) IVPB 3g/150 mL premix  Status:  Discontinued        3 g 300 mL/hr over 30 Minutes Intravenous On call to O.R. 03/27/24 1206 03/27/24 1743   03/27/24 0900  linezolid  (ZYVOX ) IVPB 600 mg        600 mg 300 mL/hr over 60 Minutes Intravenous  Once 03/27/24 0837 03/27/24 1057   03/27/24 0845  piperacillin -tazobactam (ZOSYN ) IVPB 3.375 g        3.375 g 100 mL/hr over 30 Minutes Intravenous  Once 03/27/24 0837 03/27/24 0958        MEDICATIONS: Scheduled Meds:  sodium chloride     Intravenous Once   acetaminophen   650 mg Oral Q6H WA   vitamin C   1,000 mg Oral Daily   docusate sodium   100 mg Oral BID   feeding supplement  237 mL Oral 5 X Daily   heparin   5,000 Units Subcutaneous Q8H   insulin  aspart  0-15 Units Subcutaneous TID WC   insulin  aspart  0-5 Units Subcutaneous QHS   insulin  aspart  4 Units Subcutaneous TID WC   insulin  glargine  15 Units Subcutaneous Q24H   leptospermum manuka honey  1 Application Topical Daily   levothyroxine   50 mcg Oral Q0600   mirtazapine   30 mg Oral QHS   pantoprazole   40 mg Oral Daily   polyethylene glycol  17 g Oral BID   Continuous Infusions:   PRN Meds:.albuterol , alum &  mag hydroxide-simeth, dextrose , hydrALAZINE , labetalol , methocarbamol  (ROBAXIN ) injection, ondansetron  (ZOFRAN ) IV, mouth rinse, oxyCODONE    I have personally reviewed following labs and imaging studies  LABORATORY DATA: CBC: Recent Labs  Lab 04/14/24 0426  WBC 5.3  HGB 7.6*  HCT 23.7*  MCV 89.8  PLT 142*    Basic Metabolic Panel: Recent Labs  Lab 04/14/24 0426  NA 134*  K 4.1  CL 103  CO2 25  GLUCOSE 157*  BUN 22  CREATININE 1.05  CALCIUM  8.1*    GFR: Estimated Creatinine Clearance: 100.1 mL/min (by C-G formula based on SCr of 1.05 mg/dL).  Liver Function Tests: No results for input(s): AST, ALT, ALKPHOS, BILITOT, PROT, ALBUMIN in the last 168 hours.  No results for input(s): LIPASE, AMYLASE in the last 168 hours. No results for input(s): AMMONIA in the last 168 hours.  Coagulation Profile: No results for input(s): INR, PROTIME in the last 168 hours.  Cardiac Enzymes: No results for input(s): CKTOTAL, CKMB, CKMBINDEX, TROPONINI in the last 168 hours.  BNP (last 3 results) No results for input(s): PROBNP in the last 8760 hours.  Lipid Profile: No results for input(s): CHOL, HDL, LDLCALC, TRIG, CHOLHDL, LDLDIRECT in the last 72 hours.  Thyroid  Function Tests: No results for  input(s): TSH, T4TOTAL, FREET4, T3FREE, THYROIDAB in the last 72 hours.  Anemia Panel: No results for input(s): VITAMINB12, FOLATE, FERRITIN, TIBC, IRON, RETICCTPCT in the last 72 hours.   Urine analysis:    Component Value Date/Time   COLORURINE AMBER (A) 03/27/2024 0934   APPEARANCEUR HAZY (A) 03/27/2024 0934   LABSPEC 1.016 03/27/2024 0934   PHURINE 5.0 03/27/2024 0934   GLUCOSEU >=500 (A) 03/27/2024 0934   HGBUR LARGE (A) 03/27/2024 0934   BILIRUBINUR NEGATIVE 03/27/2024 0934   KETONESUR NEGATIVE 03/27/2024 0934   PROTEINUR NEGATIVE 03/27/2024 0934   NITRITE NEGATIVE 03/27/2024 0934   LEUKOCYTESUR TRACE (A) 03/27/2024 0934    Sepsis Labs: Lactic Acid, Venous    Component Value Date/Time   LATICACIDVEN 7.6 (HH) 03/27/2024 1529    MICROBIOLOGY: No results found for this or any previous visit (from the past 240 hours).  RADIOLOGY STUDIES/RESULTS: No results found.    LOS: 20 days   Donalda Applebaum, MD  [facility]    To contact the attending provider between 7A-7P or the covering provider during after hours 7P-7A, please log into the web site [email or web address] and access using universal [place] password for that web site. If you do not have the password, please call the hospital operator.  04/16/2024, 10:12 AM

## 2024-04-16 NOTE — Plan of Care (Signed)
  Problem: Education: Goal: Knowledge of General Education information will improve Description: Including pain rating scale, medication(s)/side effects and non-pharmacologic comfort measures Outcome: Progressing   Problem: Health Behavior/Discharge Planning: Goal: Ability to manage health-related needs will improve Outcome: Progressing   Problem: Clinical Measurements: Goal: Ability to maintain clinical measurements within normal limits will improve Outcome: Progressing Goal: Will remain free from infection Outcome: Progressing Goal: Diagnostic test results will improve Outcome: Progressing Goal: Respiratory complications will improve Outcome: Progressing Goal: Cardiovascular complication will be avoided Outcome: Progressing   Problem: Activity: Goal: Risk for activity intolerance will decrease Outcome: Progressing   Problem: Nutrition: Goal: Adequate nutrition will be maintained Outcome: Progressing   Problem: Coping: Goal: Level of anxiety will decrease Outcome: Progressing   Problem: Elimination: Goal: Will not experience complications related to bowel motility Outcome: Progressing Goal: Will not experience complications related to urinary retention Outcome: Progressing   Problem: Pain Managment: Goal: General experience of comfort will improve and/or be controlled Outcome: Progressing   Problem: Safety: Goal: Ability to remain free from injury will improve Outcome: Progressing   Problem: Skin Integrity: Goal: Risk for impaired skin integrity will decrease Outcome: Progressing   Problem: Education: Goal: Ability to describe self-care measures that may prevent or decrease complications (Diabetes Survival Skills Education) will improve Outcome: Progressing Goal: Individualized Educational Video(s) Outcome: Progressing   Problem: Coping: Goal: Ability to adjust to condition or change in health will improve Outcome: Progressing   Problem: Fluid  Volume: Goal: Ability to maintain a balanced intake and output will improve Outcome: Progressing   Problem: Health Behavior/Discharge Planning: Goal: Ability to identify and utilize available resources and services will improve Outcome: Progressing Goal: Ability to manage health-related needs will improve Outcome: Progressing   Problem: Metabolic: Goal: Ability to maintain appropriate glucose levels will improve Outcome: Progressing   Problem: Nutritional: Goal: Maintenance of adequate nutrition will improve Outcome: Progressing Goal: Progress toward achieving an optimal weight will improve Outcome: Progressing   Problem: Skin Integrity: Goal: Risk for impaired skin integrity will decrease Outcome: Progressing   Problem: Tissue Perfusion: Goal: Adequacy of tissue perfusion will improve Outcome: Progressing   Problem: Education: Goal: Knowledge of the prescribed therapeutic regimen will improve Outcome: Progressing Goal: Ability to verbalize activity precautions or restrictions will improve Outcome: Progressing Goal: Understanding of discharge needs will improve Outcome: Progressing   Problem: Activity: Goal: Ability to perform//tolerate increased activity and mobilize with assistive devices will improve Outcome: Progressing   Problem: Clinical Measurements: Goal: Postoperative complications will be avoided or minimized Outcome: Progressing   Problem: Self-Care: Goal: Ability to meet self-care needs will improve Outcome: Progressing   Problem: Self-Concept: Goal: Ability to maintain and perform role responsibilities to the fullest extent possible will improve Outcome: Progressing   Problem: Pain Management: Goal: Pain level will decrease with appropriate interventions Outcome: Progressing   Problem: Skin Integrity: Goal: Demonstration of wound healing without infection will improve Outcome: Progressing   Problem: Activity: Goal: Ability to tolerate  increased activity will improve Outcome: Progressing   Problem: Respiratory: Goal: Ability to maintain a clear airway and adequate ventilation will improve Outcome: Progressing   Problem: Role Relationship: Goal: Method of communication will improve Outcome: Progressing   Problem: Safety: Goal: Non-violent Restraint(s) Outcome: Progressing

## 2024-04-16 NOTE — Plan of Care (Signed)
  Problem: Clinical Measurements: Goal: Will remain free from infection 04/16/2024 1627 by Sherlynn Clotilda BRAVO, RN Outcome: Progressing 04/16/2024 1627 by Sherlynn Clotilda BRAVO, RN Outcome: Progressing

## 2024-04-17 DIAGNOSIS — B955 Unspecified streptococcus as the cause of diseases classified elsewhere: Secondary | ICD-10-CM | POA: Diagnosis not present

## 2024-04-17 DIAGNOSIS — M726 Necrotizing fasciitis: Secondary | ICD-10-CM | POA: Diagnosis not present

## 2024-04-17 DIAGNOSIS — R6521 Severe sepsis with septic shock: Secondary | ICD-10-CM | POA: Diagnosis not present

## 2024-04-17 DIAGNOSIS — R7881 Bacteremia: Secondary | ICD-10-CM | POA: Diagnosis not present

## 2024-04-17 LAB — GLUCOSE, CAPILLARY
Glucose-Capillary: 136 mg/dL — ABNORMAL HIGH (ref 70–99)
Glucose-Capillary: 116 mg/dL — ABNORMAL HIGH (ref 70–99)
Glucose-Capillary: 142 mg/dL — ABNORMAL HIGH (ref 70–99)
Glucose-Capillary: 170 mg/dL — ABNORMAL HIGH (ref 70–99)

## 2024-04-17 NOTE — Progress Notes (Signed)
 OT Cancellation Note  Patient Details Name: JAMARCUS LADUKE MRN: [id number] DOB: 1960-12-22   Cancelled Treatment:    Reason Eval/Treat Not Completed: Fatigue/lethargy limiting ability to participate (Pt reporting they go no rest last night and requesting to come back later. Will follow up as soon as possible.) Eli Pattillo K OTR/L  Acute Rehab Services  [phone] office number   Warrick Berber 04/17/2024, 10:58 AM

## 2024-04-17 NOTE — Progress Notes (Signed)
 PT Cancellation Note  Patient Details Name: Fred Mcintosh MRN: [id number] DOB: 1960-12-01   Cancelled Treatment:    Reason Eval/Treat Not Completed: (P) Other (comment) (Pt decline PT or OT tx in AM due to poor sleep previous evening. PTA reattempt in PM @1512 , pt HR 110-125 in supine/resting and pt reports bowel incontinence and need for clean-up, and reports he was not able to sleep yet as his mother came to visit him.) Will continue efforts next date per PT plan of care as schedule permits.   Connell HERO Revis Whalin 04/17/2024, 3:43 PM

## 2024-04-17 NOTE — Progress Notes (Signed)
 PROGRESS NOTE        PATIENT DETAILS Name: Fred Mcintosh Age: 63 y.o. Sex: male Date of Birth: [birth year] Admit Date: 03/27/2024 Admitting Physician Valinda Novas, MD ERE:Ypoo, Elna, MD  Brief Summary: Patient is a 63 y.o.  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  Admitted to the ICU-orthopedic consulted-underwent left AKA-subsequently extubated-stabilized and transferred to [facility].  Significant events: 9/10>> septic shock-admit to ICU-underwent left AKA 9/11>> extubated. 9/15>> transferred to [facility].  Significant studies: 9/10>> CXR: No active disease. 9/10>> left foot x-ray: There is surrounding TMA stump-as well as lower calf. 9/10>> x-ray left tibia/fibula: Surrounding TMA stump-and lower half of leg. 9/14>> TTE: EF 70-75%, grade 1 diastolic dysfunction 916>> TEE: No vegetation. 9/17>> CT head: No acute abnormality 9/22>> MRI brain: No acute intracranial abnormality.  Significant microbiology data: 9/10>> blood culture: Streptococcus anginosus. 9/12>> blood culture: No growth  Procedures: 9/10>> left AKA 9/16>> TEE  Consults: Orthopedic PCCM Infectious disease  Subjective: Lying in bed-no major issues overnight-awaiting SNF placement.  Objective: Vitals: Blood pressure 104/74, pulse 89, temperature 98.7 F (37.1 C), temperature source Oral, resp. rate 14, height 6' 2 (1.88 m), weight 116.7 kg, SpO2 95%.   Exam: Awake/alert Clear to auscultation S1-S2 regular Abdomen: Soft nontender Left AKA.  Pertinent Labs/Radiology:    Latest Ref Rng & Units 04/14/2024    4:26 AM 04/09/2024    2:42 AM 04/08/2024    3:01 AM  CBC  WBC 4.0 - 10.5 K/uL 5.3  7.4  8.5   Hemoglobin 13.0 - 17.0 g/dL 7.6  7.7  8.1   Hematocrit 39.0 - 52.0 % 23.7  24.7  26.2   Platelets 150 - 400 K/uL 142  175  192      Lab Results  Component Value Date   NA 134 (L) 04/14/2024   K 4.1 04/14/2024   CL 103 04/14/2024   CO2 25 04/14/2024      Assessment/Plan: Septic shock secondary to left lower extremity necrotizing fasciitis and streptococcal bacteremia Sepsis physiology has resolved S/p left AKA by Dr. Harden on 9/10 TEE negative for endocarditis IV penicillin  G completed 9/26.  Acute metabolic encephalopathy Secondary to sepsis physiology Neuroimaging negative Improved-awake alert this morning  Vomiting No further vomiting Supportive care with antiemetics.   Normocytic anemia  Likely secondary to critical/acute illness Follow CBC periodically  Hypothyroidism Synthroid   HTN BP stable Currently not on any antihypertensives  DM-2 (A1c 6.7 on 9/10) CBG stable Lantus  15 units daily+ 4 units 3 times daily with meals+ SSI  Recent Labs    04/16/24 1633 04/16/24 2126 04/17/24 0741  GLUCAP 136* 116* 142*     Bronchial asthma Stable/no wheezing As needed bronchodilators.  Nutrition Status: Nutrition Problem: Increased nutrient needs Etiology: wound healing Signs/Symptoms: estimated needs Interventions: MVI, Juven, Tube feeding    Pressure Ulcer: Wound 03/27/24 Pressure Injury Buttocks Left Stage 3 -  Full thickness tissue loss. Subcutaneous fat may be visible but bone, tendon or muscle are NOT exposed. (Active)     Wound 03/27/24 Pressure Injury Sacrum Stage 3 -  Full thickness tissue loss. Subcutaneous fat may be visible but bone, tendon or muscle are NOT exposed. (Active)     Wound 04/10/24 1758 Pressure Injury Thigh Posterior;Proximal;Right Stage 3 -  Full thickness tissue loss. Subcutaneous fat may be visible but bone, tendon or muscle are NOT exposed. (Active)    Class I obesity Estimated body mass index is 33.03 kg/m as calculated from the following:   Height as of this encounter: 6' 2 (1.88 m).   Weight as of this encounter: 116.7 kg.   Code status:   Code  Status: Full Code   DVT Prophylaxis: SCD's Start: 03/27/24 1747 heparin  injection 5,000 Units Start: 03/27/24 1400   Family Communication: None at bedside   Disposition Plan: Status is: Inpatient Remains inpatient appropriate because: Severity of illness   Planned Discharge Destination:Skilled nursing facility-medically stable-awaiting bed   Diet: Diet Order             Diet regular Room service appropriate? No; Fluid consistency: Thin  Diet effective now                     Antimicrobial agents: Anti-infectives (From admission, onward)    Start     Dose/Rate Route Frequency Ordered Stop   04/03/24 1200  penicillin  G potassium 24 Million Units in dextrose  5 % 500 mL CONTINUOUS infusion        24 Million Units 20.8 mL/hr over 24 Hours Intravenous Every 24 hours 04/03/24 1013 04/13/24 0959   03/29/24 1200  cefTRIAXone  (ROCEPHIN ) 2 g in sodium chloride  0.9 % 100 mL IVPB  Status:  Discontinued        2 g 200 mL/hr over 30 Minutes Intravenous Every 24 hours 03/29/24 1037 04/03/24 1012   03/27/24 1400  piperacillin -tazobactam (ZOSYN ) IVPB 3.375 g  Status:  Discontinued        3.375 g 12.5 mL/hr over 240 Minutes Intravenous Every 8 hours 03/27/24 1208 03/29/24 1037   03/27/24 1300  linezolid  (ZYVOX ) IVPB 600 mg  Status:  Discontinued        600 mg 300 mL/hr over 60 Minutes Intravenous Every 12 hours 03/27/24 1208 03/29/24 1037   03/27/24 1215  ceFAZolin  (ANCEF ) IVPB 3g/150 mL premix  Status:  Discontinued        3 g 300 mL/hr over 30 Minutes Intravenous On call to O.R. 03/27/24 1206 03/27/24 1743   03/27/24 0900  linezolid  (ZYVOX ) IVPB 600 mg        600 mg 300 mL/hr over 60 Minutes Intravenous  Once 03/27/24 0837 03/27/24 1057   03/27/24 0845  piperacillin -tazobactam (ZOSYN ) IVPB 3.375 g        3.375 g 100 mL/hr over 30 Minutes Intravenous  Once 03/27/24 0837 03/27/24 0958        MEDICATIONS: Scheduled Meds:  sodium chloride    Intravenous Once   acetaminophen    650 mg Oral Q6H WA   vitamin C   1,000 mg Oral Daily   docusate sodium   100 mg Oral BID   feeding supplement  237 mL Oral 5 X Daily   heparin   5,000 Units Subcutaneous Q8H   insulin  aspart  0-15 Units Subcutaneous TID WC   insulin  aspart  0-5 Units Subcutaneous QHS   insulin  aspart  4 Units Subcutaneous TID WC   insulin  glargine  15 Units Subcutaneous Q24H   leptospermum manuka honey  1 Application Topical Daily   levothyroxine   50 mcg Oral Q0600   mirtazapine   30 mg Oral QHS   pantoprazole   40 mg Oral Daily   polyethylene glycol  17 g Oral BID   Continuous Infusions:   PRN Meds:.albuterol , alum & mag hydroxide-simeth, dextrose , hydrALAZINE , labetalol , methocarbamol  (ROBAXIN )  injection, ondansetron  (ZOFRAN ) IV, mouth rinse, oxyCODONE    I have personally reviewed following labs and imaging studies  LABORATORY DATA: CBC: Recent Labs  Lab 04/14/24 0426  WBC 5.3  HGB 7.6*  HCT 23.7*  MCV 89.8  PLT 142*    Basic Metabolic Panel: Recent Labs  Lab 04/14/24 0426  NA 134*  K 4.1  CL 103  CO2 25  GLUCOSE 157*  BUN 22  CREATININE 1.05  CALCIUM  8.1*    GFR: Estimated Creatinine Clearance: 99 mL/min (by C-G formula based on SCr of 1.05 mg/dL).  Liver Function Tests: No results for input(s): AST, ALT, ALKPHOS, BILITOT, PROT, ALBUMIN in the last 168 hours.  No results for input(s): LIPASE, AMYLASE in the last 168 hours. No results for input(s): AMMONIA in the last 168 hours.  Coagulation Profile: No results for input(s): INR, PROTIME in the last 168 hours.  Cardiac Enzymes: No results for input(s): CKTOTAL, CKMB, CKMBINDEX, TROPONINI in the last 168 hours.  BNP (last 3 results) No results for input(s): PROBNP in the last 8760 hours.  Lipid Profile: No results for input(s): CHOL, HDL, LDLCALC, TRIG, CHOLHDL, LDLDIRECT in the last 72 hours.  Thyroid  Function Tests: No results for input(s): TSH, T4TOTAL, FREET4,  T3FREE, THYROIDAB in the last 72 hours.  Anemia Panel: No results for input(s): VITAMINB12, FOLATE, FERRITIN, TIBC, IRON, RETICCTPCT in the last 72 hours.   Urine analysis:    Component Value Date/Time   COLORURINE AMBER (A) 03/27/2024 0934   APPEARANCEUR HAZY (A) 03/27/2024 0934   LABSPEC 1.016 03/27/2024 0934   PHURINE 5.0 03/27/2024 0934   GLUCOSEU >=500 (A) 03/27/2024 0934   HGBUR LARGE (A) 03/27/2024 0934   BILIRUBINUR NEGATIVE 03/27/2024 0934   KETONESUR NEGATIVE 03/27/2024 0934   PROTEINUR NEGATIVE 03/27/2024 0934   NITRITE NEGATIVE 03/27/2024 0934   LEUKOCYTESUR TRACE (A) 03/27/2024 0934    Sepsis Labs: Lactic Acid, Venous    Component Value Date/Time   LATICACIDVEN 7.6 (HH) 03/27/2024 1529    MICROBIOLOGY: No results found for this or any previous visit (from the past 240 hours).  RADIOLOGY STUDIES/RESULTS: No results found.    LOS: 21 days   Donalda Applebaum, MD  [facility]    To contact the attending provider between 7A-7P or the covering provider during after hours 7P-7A, please log into the web site [email or web address] and access using universal [place] password for that web site. If you do not have the password, please call the hospital operator.  04/17/2024, 11:04 AM

## 2024-04-17 NOTE — TOC Progression Note (Addendum)
 Transition of Care [facility]) - Progression Note    Patient Details  Name: Fred Mcintosh MRN: [id number] Date of Birth: Nov 29, 1960  [facility] CM/SW Contact  Inocente GORMAN Kindle, LCSW Phone Number: 04/17/2024, 9:27 AM  Clinical Narrative:    Requested Financial Navigator assess patient for Medicaid eligibility. [employer] also planning on calling the patient's BCBS CM.    Expected Discharge Plan: Skilled Nursing Facility Barriers to Discharge: Insurance Authorization, SNF Pending bed offer, Continued Medical Work up               Expected Discharge Plan and Services In-house Referral: Clinical Social Work   Post Acute Care Choice: Skilled Nursing Facility Living arrangements for the past 2 months: Single Family Home                                       Social Drivers of Health (SDOH) Interventions SDOH Screenings   Food Insecurity: No Food Insecurity (03/08/2024)  Housing: Low Risk  (03/08/2024)  Transportation Needs: No Transportation Needs (03/08/2024)  Utilities: Not At Risk (03/08/2024)  Social Connections: Moderately Integrated (01/18/2024)  Tobacco Use: Unknown (03/27/2024)    Readmission Risk Interventions     No data to display

## 2024-04-17 NOTE — Plan of Care (Signed)
  Problem: Education: Goal: Knowledge of General Education information will improve Description: Including pain rating scale, medication(s)/side effects and non-pharmacologic comfort measures Outcome: Progressing   Problem: Health Behavior/Discharge Planning: Goal: Ability to manage health-related needs will improve Outcome: Progressing   Problem: Clinical Measurements: Goal: Ability to maintain clinical measurements within normal limits will improve Outcome: Progressing Goal: Will remain free from infection Outcome: Progressing Goal: Diagnostic test results will improve Outcome: Progressing Goal: Respiratory complications will improve Outcome: Progressing Goal: Cardiovascular complication will be avoided Outcome: Progressing   Problem: Activity: Goal: Risk for activity intolerance will decrease Outcome: Progressing   Problem: Nutrition: Goal: Adequate nutrition will be maintained Outcome: Progressing   Problem: Coping: Goal: Level of anxiety will decrease Outcome: Progressing   Problem: Elimination: Goal: Will not experience complications related to bowel motility Outcome: Progressing Goal: Will not experience complications related to urinary retention Outcome: Progressing   Problem: Pain Managment: Goal: General experience of comfort will improve and/or be controlled Outcome: Progressing   Problem: Safety: Goal: Ability to remain free from injury will improve Outcome: Progressing   Problem: Skin Integrity: Goal: Risk for impaired skin integrity will decrease Outcome: Progressing   Problem: Education: Goal: Ability to describe self-care measures that may prevent or decrease complications (Diabetes Survival Skills Education) will improve Outcome: Progressing Goal: Individualized Educational Video(s) Outcome: Progressing   Problem: Coping: Goal: Ability to adjust to condition or change in health will improve Outcome: Progressing   Problem: Fluid  Volume: Goal: Ability to maintain a balanced intake and output will improve Outcome: Progressing   Problem: Health Behavior/Discharge Planning: Goal: Ability to identify and utilize available resources and services will improve Outcome: Progressing Goal: Ability to manage health-related needs will improve Outcome: Progressing   Problem: Metabolic: Goal: Ability to maintain appropriate glucose levels will improve Outcome: Progressing   Problem: Nutritional: Goal: Maintenance of adequate nutrition will improve Outcome: Progressing Goal: Progress toward achieving an optimal weight will improve Outcome: Progressing   Problem: Skin Integrity: Goal: Risk for impaired skin integrity will decrease Outcome: Progressing   Problem: Tissue Perfusion: Goal: Adequacy of tissue perfusion will improve Outcome: Progressing   Problem: Education: Goal: Knowledge of the prescribed therapeutic regimen will improve Outcome: Progressing Goal: Ability to verbalize activity precautions or restrictions will improve Outcome: Progressing Goal: Understanding of discharge needs will improve Outcome: Progressing   Problem: Activity: Goal: Ability to perform//tolerate increased activity and mobilize with assistive devices will improve Outcome: Progressing   Problem: Clinical Measurements: Goal: Postoperative complications will be avoided or minimized Outcome: Progressing   Problem: Self-Care: Goal: Ability to meet self-care needs will improve Outcome: Progressing   Problem: Self-Concept: Goal: Ability to maintain and perform role responsibilities to the fullest extent possible will improve Outcome: Progressing   Problem: Pain Management: Goal: Pain level will decrease with appropriate interventions Outcome: Progressing   Problem: Skin Integrity: Goal: Demonstration of wound healing without infection will improve Outcome: Progressing   Problem: Activity: Goal: Ability to tolerate  increased activity will improve Outcome: Progressing   Problem: Respiratory: Goal: Ability to maintain a clear airway and adequate ventilation will improve Outcome: Progressing   Problem: Role Relationship: Goal: Method of communication will improve Outcome: Progressing   Problem: Safety: Goal: Non-violent Restraint(s) Outcome: Progressing

## 2024-04-18 DIAGNOSIS — B955 Unspecified streptococcus as the cause of diseases classified elsewhere: Secondary | ICD-10-CM | POA: Diagnosis not present

## 2024-04-18 DIAGNOSIS — R6521 Severe sepsis with septic shock: Secondary | ICD-10-CM | POA: Diagnosis not present

## 2024-04-18 DIAGNOSIS — R7881 Bacteremia: Secondary | ICD-10-CM | POA: Diagnosis not present

## 2024-04-18 DIAGNOSIS — M726 Necrotizing fasciitis: Secondary | ICD-10-CM | POA: Diagnosis not present

## 2024-04-18 LAB — GLUCOSE, CAPILLARY
Glucose-Capillary: 137 mg/dL — ABNORMAL HIGH (ref 70–99)
Glucose-Capillary: 115 mg/dL — ABNORMAL HIGH (ref 70–99)
Glucose-Capillary: 95 mg/dL (ref 70–99)
Glucose-Capillary: 130 mg/dL — ABNORMAL HIGH (ref 70–99)

## 2024-04-18 MED ORDER — LOPERAMIDE HCL 2 MG PO CAPS
2.0000 mg | ORAL_CAPSULE | ORAL | Status: DC | PRN
  Administered 2024-04-18: 2 mg via ORAL
  Filled 2024-04-18: qty 1

## 2024-04-18 MED ORDER — SIMETHICONE 40 MG/0.6ML PO SUSP
80.0000 mg | Freq: Once | ORAL | Status: AC
  Administered 2024-04-18: 80 mg via ORAL
  Filled 2024-04-18: qty 1.2

## 2024-04-18 MED ORDER — POLYETHYLENE GLYCOL 3350 17 G PO PACK
17.0000 g | PACK | Freq: Every day | ORAL | Status: DC
  Administered 2024-04-23 – 2024-04-30 (×2): 17 g via ORAL
  Filled 2024-04-18 (×8): qty 1

## 2024-04-18 NOTE — Plan of Care (Signed)
  Problem: Education: Goal: Knowledge of General Education information will improve Description: Including pain rating scale, medication(s)/side effects and non-pharmacologic comfort measures Outcome: Progressing   Problem: Clinical Measurements: Goal: Respiratory complications will improve Outcome: Progressing   Problem: Activity: Goal: Risk for activity intolerance will decrease Outcome: Progressing   Problem: Pain Managment: Goal: General experience of comfort will improve and/or be controlled Outcome: Progressing   Problem: Safety: Goal: Ability to remain free from injury will improve Outcome: Progressing   Problem: Skin Integrity: Goal: Risk for impaired skin integrity will decrease Outcome: Progressing

## 2024-04-18 NOTE — Progress Notes (Signed)
 PROGRESS NOTE        PATIENT DETAILS Name: Fred Mcintosh Age: 63 y.o. Sex: male Date of Birth: Feb 21, 1961 Admit Date: 03/27/2024 Admitting Physician Valinda Novas, MD ERE:Ypoo, Elna, MD  Brief Summary: Patient is a 63 y.o.  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  Admitted to the ICU-orthopedic consulted-underwent left AKA-subsequently extubated-stabilized and transferred to [facility].  Significant events: 9/10>> septic shock-admit to ICU-underwent left AKA 9/11>> extubated. 9/15>> transferred to [facility].  Significant studies: 9/10>> CXR: No active disease. 9/10>> left foot x-ray: There is surrounding TMA stump-as well as lower calf. 9/10>> x-ray left tibia/fibula: Surrounding TMA stump-and lower half of leg. 9/14>> TTE: EF 70-75%, grade 1 diastolic dysfunction 916>> TEE: No vegetation. 9/17>> CT head: No acute abnormality 9/22>> MRI brain: No acute intracranial abnormality.  Significant microbiology data: 9/10>> blood culture: Streptococcus anginosus. 9/12>> blood culture: No growth  Procedures: 9/10>> left AKA 9/16>> TEE  Consults: Orthopedic PCCM Infectious disease  Subjective: Had some loose stools yesterday but none overnight.  Objective: Vitals: Blood pressure (!) 126/91, pulse 99, temperature 98.4 F (36.9 C), temperature source Oral, resp. rate (!) 5, height 6' 2 (1.88 m), weight 115.5 kg, SpO2 98%.   Exam: Awake/alert Chest: Clear to auscultation CVS: S1-S2 regular Abdomen: Soft nontender Left AKA  Pertinent Labs/Radiology:    Latest Ref Rng & Units 04/14/2024    4:26 AM 04/09/2024    2:42 AM 04/08/2024    3:01 AM  CBC  WBC 4.0 - 10.5 K/uL 5.3  7.4  8.5   Hemoglobin 13.0 - 17.0 g/dL 7.6  7.7  8.1   Hematocrit 39.0 - 52.0 % 23.7  24.7  26.2   Platelets 150 - 400 K/uL 142  175  192      Lab Results  Component Value Date   NA 134 (L) 04/14/2024   K 4.1 04/14/2024   CL 103 04/14/2024   CO2 25 04/14/2024      Assessment/Plan: Septic shock secondary to left lower extremity necrotizing fasciitis and streptococcal bacteremia Sepsis physiology has resolved S/p left AKA by Dr. Harden on 9/10 TEE negative for endocarditis IV penicillin  G completed 9/26.  Acute metabolic encephalopathy Secondary to sepsis physiology Neuroimaging negative Improved-awake alert this morning  Vomiting No further vomiting Supportive care with antiemetics.   Normocytic anemia  Likely secondary to critical/acute illness Follow CBC periodically  Hypothyroidism Synthroid   HTN BP stable Currently not on any antihypertensives  DM-2 (A1c 6.7 on 9/10) CBG stable Lantus  15 units daily+ 4 units 3 times daily with meals+ SSI  Recent Labs    04/16/24 2126 04/17/24 0741 04/17/24 1226  GLUCAP 116* 142* 170*     Bronchial asthma Stable/no wheezing As needed bronchodilators.  Nutrition Status: Nutrition Problem: Increased nutrient needs Etiology: wound healing Signs/Symptoms: estimated needs Interventions: MVI, Juven, Tube feeding    Pressure Ulcer: Wound 03/27/24 Pressure Injury Buttocks Left Stage 3 -  Full thickness tissue loss. Subcutaneous fat may be visible but bone, tendon or muscle are NOT exposed. (Active)     Wound 03/27/24 Pressure Injury Sacrum Stage 3 -  Full thickness tissue loss. Subcutaneous fat may be visible but bone, tendon or muscle are NOT exposed. (Active)     Wound 04/10/24 1758 Pressure Injury Thigh  Posterior;Proximal;Right Stage 3 -  Full thickness tissue loss. Subcutaneous fat may be visible but bone, tendon or muscle are NOT exposed. (Active)    Class I obesity Estimated body mass index is 32.69 kg/m as calculated from the following:   Height as of this encounter: 6' 2 (1.88 m).   Weight as of this encounter: 115.5 kg.   Code status:   Code  Status: Full Code   DVT Prophylaxis: SCD's Start: 03/27/24 1747 heparin  injection 5,000 Units Start: 03/27/24 1400   Family Communication: None at bedside   Disposition Plan: Status is: Inpatient Remains inpatient appropriate because: Severity of illness   Planned Discharge Destination:Skilled nursing facility-medically stable-awaiting bed   Diet: Diet Order             Diet regular Room service appropriate? No; Fluid consistency: Thin  Diet effective now                     Antimicrobial agents: Anti-infectives (From admission, onward)    Start     Dose/Rate Route Frequency Ordered Stop   04/03/24 1200  penicillin  G potassium 24 Million Units in dextrose  5 % 500 mL CONTINUOUS infusion        24 Million Units 20.8 mL/hr over 24 Hours Intravenous Every 24 hours 04/03/24 1013 04/13/24 0959   03/29/24 1200  cefTRIAXone  (ROCEPHIN ) 2 g in sodium chloride  0.9 % 100 mL IVPB  Status:  Discontinued        2 g 200 mL/hr over 30 Minutes Intravenous Every 24 hours 03/29/24 1037 04/03/24 1012   03/27/24 1400  piperacillin -tazobactam (ZOSYN ) IVPB 3.375 g  Status:  Discontinued        3.375 g 12.5 mL/hr over 240 Minutes Intravenous Every 8 hours 03/27/24 1208 03/29/24 1037   03/27/24 1300  linezolid  (ZYVOX ) IVPB 600 mg  Status:  Discontinued        600 mg 300 mL/hr over 60 Minutes Intravenous Every 12 hours 03/27/24 1208 03/29/24 1037   03/27/24 1215  ceFAZolin  (ANCEF ) IVPB 3g/150 mL premix  Status:  Discontinued        3 g 300 mL/hr over 30 Minutes Intravenous On call to O.R. 03/27/24 1206 03/27/24 1743   03/27/24 0900  linezolid  (ZYVOX ) IVPB 600 mg        600 mg 300 mL/hr over 60 Minutes Intravenous  Once 03/27/24 0837 03/27/24 1057   03/27/24 0845  piperacillin -tazobactam (ZOSYN ) IVPB 3.375 g        3.375 g 100 mL/hr over 30 Minutes Intravenous  Once 03/27/24 0837 03/27/24 0958        MEDICATIONS: Scheduled Meds:  sodium chloride    Intravenous Once   acetaminophen    650 mg Oral Q6H WA   vitamin C   1,000 mg Oral Daily   docusate sodium   100 mg Oral BID   feeding supplement  237 mL Oral 5 X Daily   heparin   5,000 Units Subcutaneous Q8H   insulin  aspart  0-15 Units Subcutaneous TID WC   insulin  aspart  0-5 Units Subcutaneous QHS   insulin  aspart  4 Units Subcutaneous TID WC   insulin  glargine  15 Units Subcutaneous Q24H   leptospermum manuka honey  1 Application Topical Daily   levothyroxine   50 mcg Oral Q0600   mirtazapine   30 mg Oral QHS   pantoprazole   40 mg Oral Daily   polyethylene glycol  17 g Oral BID   Continuous Infusions:   PRN Meds:.albuterol , alum & mag hydroxide-simeth,  dextrose , hydrALAZINE , labetalol , methocarbamol  (ROBAXIN ) injection, ondansetron  (ZOFRAN ) IV, mouth rinse, oxyCODONE    I have personally reviewed following labs and imaging studies  LABORATORY DATA: CBC: Recent Labs  Lab 04/14/24 0426  WBC 5.3  HGB 7.6*  HCT 23.7*  MCV 89.8  PLT 142*    Basic Metabolic Panel: Recent Labs  Lab 04/14/24 0426  NA 134*  K 4.1  CL 103  CO2 25  GLUCOSE 157*  BUN 22  CREATININE 1.05  CALCIUM  8.1*    GFR: Estimated Creatinine Clearance: 98.5 mL/min (by C-G formula based on SCr of 1.05 mg/dL).  Liver Function Tests: No results for input(s): AST, ALT, ALKPHOS, BILITOT, PROT, ALBUMIN in the last 168 hours.  No results for input(s): LIPASE, AMYLASE in the last 168 hours. No results for input(s): AMMONIA in the last 168 hours.  Coagulation Profile: No results for input(s): INR, PROTIME in the last 168 hours.  Cardiac Enzymes: No results for input(s): CKTOTAL, CKMB, CKMBINDEX, TROPONINI in the last 168 hours.  BNP (last 3 results) No results for input(s): PROBNP in the last 8760 hours.  Lipid Profile: No results for input(s): CHOL, HDL, LDLCALC, TRIG, CHOLHDL, LDLDIRECT in the last 72 hours.  Thyroid  Function Tests: No results for input(s): TSH, T4TOTAL, FREET4,  T3FREE, THYROIDAB in the last 72 hours.  Anemia Panel: No results for input(s): VITAMINB12, FOLATE, FERRITIN, TIBC, IRON, RETICCTPCT in the last 72 hours.   Urine analysis:    Component Value Date/Time   COLORURINE AMBER (A) 03/27/2024 0934   APPEARANCEUR HAZY (A) 03/27/2024 0934   LABSPEC 1.016 03/27/2024 0934   PHURINE 5.0 03/27/2024 0934   GLUCOSEU >=500 (A) 03/27/2024 0934   HGBUR LARGE (A) 03/27/2024 0934   BILIRUBINUR NEGATIVE 03/27/2024 0934   KETONESUR NEGATIVE 03/27/2024 0934   PROTEINUR NEGATIVE 03/27/2024 0934   NITRITE NEGATIVE 03/27/2024 0934   LEUKOCYTESUR TRACE (A) 03/27/2024 0934    Sepsis Labs: Lactic Acid, Venous    Component Value Date/Time   LATICACIDVEN 7.6 (HH) 03/27/2024 1529    MICROBIOLOGY: No results found for this or any previous visit (from the past 240 hours).  RADIOLOGY STUDIES/RESULTS: No results found.    LOS: 22 days   Donalda Applebaum, MD  [facility]    To contact the attending provider between 7A-7P or the covering provider during after hours 7P-7A, please log into the web site [email or web address] and access using universal [place] password for that web site. If you do not have the password, please call the hospital operator.  04/18/2024, 11:34 AM

## 2024-04-18 NOTE — Progress Notes (Signed)
 Physical Therapy Treatment Patient Details Name: Fred Mcintosh MRN: [id number] DOB: January 31, 1961 Today's Date: 04/18/2024   History of Present Illness 63 y.o. 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 PMHx: Lt TMA 03/08/24, T2DM, HTN, obesity, Graves' disease    PT Comments  Pt received in bed. Declining OOB to recliner due to stating he was left sitting up too long last time. Agreeable to sitting EOB and exercises. +2 mod assist bed mobility, and +2 mod assist lateral scoots EOB. Pt sat EOB 7-10 minutes. Pt performed LE HEP in sitting and supine. Bed placed in semi chair position at end of session.     If plan is discharge home, recommend the following: Two people to help with walking and/or transfers;Two people to help with bathing/dressing/bathroom;Direct supervision/assist for medications management;Assistance with feeding;Assistance with cooking/housework;Direct supervision/assist for financial management;Supervision due to cognitive status;Assist for transportation;Help with stairs or ramp for entrance   Can travel by private vehicle     No  Equipment Recommendations  Wheelchair (measurements PT);Wheelchair cushion (measurements PT);BSC/3in1;Hospital bed;Hoyer lift    Recommendations for Other Services       Precautions / Restrictions Precautions Precautions: Fall;Other (comment) Recall of Precautions/Restrictions: Impaired Precaution/Restrictions Comments: L AKA, sacral wound Restrictions LLE Weight Bearing Per Provider Order: Non weight bearing     Mobility  Bed Mobility Overal bed mobility: Needs Assistance Bed Mobility: Rolling, Supine to Sit, Sit to Supine Rolling: Mod assist   Supine to sit: Mod assist, +2 for safety/equipment, Used rails, HOB elevated Sit to supine: Mod assist, +2 for safety/equipment, Used rails   General bed mobility comments: cues for  sequencing, use of bed pad to scoot to EOB    Transfers Overall transfer level: Needs assistance Equipment used: None               General transfer comment: lateral scoots along EOB +2 mod assist using bed pad    Ambulation/Gait                   Stairs             Wheelchair Mobility     Tilt Bed    Modified Rankin (Stroke Patients Only)       Balance Overall balance assessment: Needs assistance Sitting-balance support: Feet supported, No upper extremity supported Sitting balance-Leahy Scale: Good                                      Communication Communication Communication: No apparent difficulties  Cognition Arousal: Alert Behavior During Therapy: WFL for tasks assessed/performed   PT - Cognitive impairments: No family/caregiver present to determine baseline, Problem solving, Safety/Judgement, Awareness                         Following commands: Intact      Cueing Cueing Techniques: Verbal cues  Exercises Amputee Exercises Gluteal Sets: AROM, Both, 10 reps, Supine Hip Extension: AROM, Left, 10 reps, Supine Hip ABduction/ADduction: AROM, Left, 10 reps, Supine Hip Flexion/Marching: AROM, Right, 10 reps, Seated Knee Extension: AROM, Right, 10 reps, Seated    General Comments General comments (skin integrity, edema, etc.): Max HR 106      Pertinent Vitals/Pain Pain Assessment Pain Assessment: Faces Faces Pain Scale: Hurts little more Pain Location: LLE  phantom limb pain Pain Descriptors / Indicators: Grimacing, Guarding, Burning, Heaviness Pain Intervention(s): Monitored during session, Repositioned    Home Living                          Prior Function            PT Goals (current goals can now be found in the care plan section) Acute Rehab PT Goals Patient Stated Goal: to walk again and return to work Progress towards PT goals: Progressing toward goals    Frequency    Min  2X/week      PT Plan      Co-evaluation              AM-PAC PT 6 Clicks Mobility   Outcome Measure  Help needed turning from your back to your side while in a flat bed without using bedrails?: A Lot Help needed moving from lying on your back to sitting on the side of a flat bed without using bedrails?: A Lot Help needed moving to and from a bed to a chair (including a wheelchair)?: Total Help needed standing up from a chair using your arms (e.g., wheelchair or bedside chair)?: Total Help needed to walk in hospital room?: Total Help needed climbing 3-5 steps with a railing? : Total 6 Click Score: 8    End of Session   Activity Tolerance: Patient tolerated treatment well Patient left: in bed;with call bell/phone within reach;with bed alarm set Nurse Communication: Mobility status PT Visit Diagnosis: Difficulty in walking, not elsewhere classified (R26.2);Unsteadiness on feet (R26.81);Other abnormalities of gait and mobility (R26.89);Muscle weakness (generalized) (M62.81)     Time: 8996-8972 PT Time Calculation (min) (ACUTE ONLY): 24 min  Charges:    $Therapeutic Exercise: 8-22 mins $Therapeutic Activity: 8-22 mins PT General Charges $$ ACUTE PT VISIT: 1 Visit                     Sari MATSU., PT  Office # [phone]    Erven Sari Shaker 04/18/2024, 11:24 AM

## 2024-04-19 DIAGNOSIS — B955 Unspecified streptococcus as the cause of diseases classified elsewhere: Secondary | ICD-10-CM | POA: Diagnosis not present

## 2024-04-19 DIAGNOSIS — M726 Necrotizing fasciitis: Secondary | ICD-10-CM | POA: Diagnosis not present

## 2024-04-19 DIAGNOSIS — R6521 Severe sepsis with septic shock: Secondary | ICD-10-CM | POA: Diagnosis not present

## 2024-04-19 DIAGNOSIS — R7881 Bacteremia: Secondary | ICD-10-CM | POA: Diagnosis not present

## 2024-04-19 LAB — GLUCOSE, CAPILLARY
Glucose-Capillary: 155 mg/dL — ABNORMAL HIGH (ref 70–99)
Glucose-Capillary: 115 mg/dL — ABNORMAL HIGH (ref 70–99)
Glucose-Capillary: 99 mg/dL (ref 70–99)
Glucose-Capillary: 79 mg/dL (ref 70–99)
Glucose-Capillary: 80 mg/dL (ref 70–99)
Glucose-Capillary: 108 mg/dL — ABNORMAL HIGH (ref 70–99)

## 2024-04-19 MED ORDER — SIMETHICONE 80 MG PO CHEW
80.0000 mg | CHEWABLE_TABLET | Freq: Once | ORAL | Status: AC
  Administered 2024-04-19: 80 mg via ORAL
  Filled 2024-04-19: qty 1

## 2024-04-19 NOTE — Plan of Care (Signed)

## 2024-04-19 NOTE — Progress Notes (Signed)
 PROGRESS NOTE        PATIENT DETAILS Name: Fred Mcintosh Age: 63 y.o. Sex: male Date of Birth: [birth year] Admit Date: 03/27/2024 Admitting Physician Valinda Novas, MD ERE:Ypoo, Elna, MD  Brief Summary: Patient is a 63 y.o.  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  Admitted to the ICU-orthopedic consulted-underwent left AKA-subsequently extubated-stabilized and transferred to [facility].  Significant events: 9/10>> septic shock-admit to ICU-underwent left AKA 9/11>> extubated. 9/15>> transferred to [facility].  Significant studies: 9/10>> CXR: No active disease. 9/10>> left foot x-ray: There is surrounding TMA stump-as well as lower calf. 9/10>> x-ray left tibia/fibula: Surrounding TMA stump-and lower half of leg. 9/14>> TTE: EF 70-75%, grade 1 diastolic dysfunction 916>> TEE: No vegetation. 9/17>> CT head: No acute abnormality 9/22>> MRI brain: No acute intracranial abnormality.  Significant microbiology data: 9/10>> blood culture: Streptococcus anginosus. 9/12>> blood culture: No growth  Procedures: 9/10>> left AKA 9/16>> TEE  Consults: Orthopedic PCCM Infectious disease  Subjective: No major issues overnight-lying comfortably in bed.  Awaiting SNF  Objective: Vitals: Blood pressure 112/78, pulse 96, temperature 98.3 F (36.8 C), temperature source Oral, resp. rate 10, height 6' 2 (1.88 m), weight 117.5 kg, SpO2 97%.   Exam: Awake/alert Chest: Clear to auscultation CVS: S1-S2 regular Abdomen: Soft nontender Left AKA  Pertinent Labs/Radiology:    Latest Ref Rng & Units 04/14/2024    4:26 AM 04/09/2024    2:42 AM 04/08/2024    3:01 AM  CBC  WBC 4.0 - 10.5 K/uL 5.3  7.4  8.5   Hemoglobin 13.0 - 17.0 g/dL 7.6  7.7  8.1   Hematocrit 39.0 - 52.0 % 23.7  24.7  26.2   Platelets 150 - 400 K/uL 142   175  192     Lab Results  Component Value Date   NA 134 (L) 04/14/2024   K 4.1 04/14/2024   CL 103 04/14/2024   CO2 25 04/14/2024      Assessment/Plan: Septic shock secondary to left lower extremity necrotizing fasciitis and streptococcal bacteremia Sepsis physiology has resolved S/p left AKA by Dr. Harden on 9/10 TEE negative for endocarditis IV penicillin  G completed 9/26.  Acute metabolic encephalopathy Secondary to sepsis physiology Neuroimaging negative Improved-awake alert this morning  Vomiting No further vomiting Supportive care with antiemetics.   Normocytic anemia  Likely secondary to critical/acute illness Follow CBC periodically  Hypothyroidism Synthroid   HTN BP stable Currently not on any antihypertensives  DM-2 (A1c 6.7 on 9/10) CBG stable Lantus  15 units daily+ 4 units 3 times daily with meals+ SSI  Recent Labs    04/19/24 0802 04/19/24 0820 04/19/24 1149  GLUCAP 79 80 108*     Bronchial asthma Stable/no wheezing As needed bronchodilators.  Nutrition Status: Nutrition Problem: Increased nutrient needs Etiology: wound healing Signs/Symptoms: estimated needs Interventions: MVI, Juven, Tube feeding    Pressure Ulcer: Wound 03/27/24 Pressure Injury Buttocks Left Stage 3 -  Full thickness tissue loss. Subcutaneous fat may be visible but bone, tendon or muscle are NOT exposed. (Active)     Wound 03/27/24 Pressure Injury Sacrum Stage 3 -  Full thickness tissue loss. Subcutaneous fat may be visible but bone, tendon or muscle are NOT exposed. (Active)     Wound 04/10/24 1758 Pressure Injury Thigh  Posterior;Proximal;Right Stage 3 -  Full thickness tissue loss. Subcutaneous fat may be visible but bone, tendon or muscle are NOT exposed. (Active)    Class I obesity Estimated body mass index is 33.26 kg/m as calculated from the following:   Height as of this encounter: 6' 2 (1.88 m).   Weight as of this encounter: 117.5 kg.   Code status:    Code Status: Full Code   DVT Prophylaxis: SCD's Start: 03/27/24 1747 heparin  injection 5,000 Units Start: 03/27/24 1400   Family Communication: None at bedside   Disposition Plan: Status is: Inpatient Remains inpatient appropriate because: Severity of illness   Planned Discharge Destination:Skilled nursing facility-medically stable-awaiting bed   Diet: Diet Order             Diet regular Room service appropriate? No; Fluid consistency: Thin  Diet effective now                     Antimicrobial agents: Anti-infectives (From admission, onward)    Start     Dose/Rate Route Frequency Ordered Stop   04/03/24 1200  penicillin  G potassium 24 Million Units in dextrose  5 % 500 mL CONTINUOUS infusion        24 Million Units 20.8 mL/hr over 24 Hours Intravenous Every 24 hours 04/03/24 1013 04/13/24 0959   03/29/24 1200  cefTRIAXone  (ROCEPHIN ) 2 g in sodium chloride  0.9 % 100 mL IVPB  Status:  Discontinued        2 g 200 mL/hr over 30 Minutes Intravenous Every 24 hours 03/29/24 1037 04/03/24 1012   03/27/24 1400  piperacillin -tazobactam (ZOSYN ) IVPB 3.375 g  Status:  Discontinued        3.375 g 12.5 mL/hr over 240 Minutes Intravenous Every 8 hours 03/27/24 1208 03/29/24 1037   03/27/24 1300  linezolid  (ZYVOX ) IVPB 600 mg  Status:  Discontinued        600 mg 300 mL/hr over 60 Minutes Intravenous Every 12 hours 03/27/24 1208 03/29/24 1037   03/27/24 1215  ceFAZolin  (ANCEF ) IVPB 3g/150 mL premix  Status:  Discontinued        3 g 300 mL/hr over 30 Minutes Intravenous On call to O.R. 03/27/24 1206 03/27/24 1743   03/27/24 0900  linezolid  (ZYVOX ) IVPB 600 mg        399 mg 300 mL/hr over 60 Minutes Intravenous  Once 03/27/24 0837 03/27/24 1057   03/27/24 0845  piperacillin -tazobactam (ZOSYN ) IVPB 3.375 g        3.375 g 100 mL/hr over 30 Minutes Intravenous  Once 03/27/24 0837 03/27/24 0958        MEDICATIONS: Scheduled Meds:  sodium chloride    Intravenous Once    acetaminophen   650 mg Oral Q6H WA   vitamin C   1,000 mg Oral Daily   heparin   5,000 Units Subcutaneous Q8H   insulin  aspart  0-15 Units Subcutaneous TID WC   insulin  aspart  0-5 Units Subcutaneous QHS   insulin  aspart  4 Units Subcutaneous TID WC   insulin  glargine  15 Units Subcutaneous Q24H   leptospermum manuka honey  1 Application Topical Daily   levothyroxine   50 mcg Oral Q0600   mirtazapine   30 mg Oral QHS   pantoprazole   40 mg Oral Daily   polyethylene glycol  17 g Oral Daily   Continuous Infusions:   PRN Meds:.albuterol , alum & mag hydroxide-simeth, dextrose , hydrALAZINE , labetalol , loperamide, methocarbamol  (ROBAXIN ) injection, ondansetron  (ZOFRAN ) IV, mouth rinse, oxyCODONE    I have personally reviewed following  labs and imaging studies  LABORATORY DATA: CBC: Recent Labs  Lab 04/14/24 0426  WBC 5.3  HGB 7.6*  HCT 23.7*  MCV 89.8  PLT 142*    Basic Metabolic Panel: Recent Labs  Lab 04/14/24 0426  NA 134*  K 4.1  CL 103  CO2 25  GLUCOSE 157*  BUN 22  CREATININE 1.05  CALCIUM  8.1*    GFR: Estimated Creatinine Clearance: 99.4 mL/min (by C-G formula based on SCr of 1.05 mg/dL).  Liver Function Tests: No results for input(s): AST, ALT, ALKPHOS, BILITOT, PROT, ALBUMIN in the last 168 hours.  No results for input(s): LIPASE, AMYLASE in the last 168 hours. No results for input(s): AMMONIA in the last 168 hours.  Coagulation Profile: No results for input(s): INR, PROTIME in the last 168 hours.  Cardiac Enzymes: No results for input(s): CKTOTAL, CKMB, CKMBINDEX, TROPONINI in the last 168 hours.  BNP (last 3 results) No results for input(s): PROBNP in the last 8760 hours.  Lipid Profile: No results for input(s): CHOL, HDL, LDLCALC, TRIG, CHOLHDL, LDLDIRECT in the last 72 hours.  Thyroid  Function Tests: No results for input(s): TSH, T4TOTAL, FREET4, T3FREE, THYROIDAB in the last 72 hours.  Anemia  Panel: No results for input(s): VITAMINB12, FOLATE, FERRITIN, TIBC, IRON, RETICCTPCT in the last 72 hours.   Urine analysis:    Component Value Date/Time   COLORURINE AMBER (A) 03/27/2024 0934   APPEARANCEUR HAZY (A) 03/27/2024 0934   LABSPEC 1.016 03/27/2024 0934   PHURINE 5.0 03/27/2024 0934   GLUCOSEU >=500 (A) 03/27/2024 0934   HGBUR LARGE (A) 03/27/2024 0934   BILIRUBINUR NEGATIVE 03/27/2024 0934   KETONESUR NEGATIVE 03/27/2024 0934   PROTEINUR NEGATIVE 03/27/2024 0934   NITRITE NEGATIVE 03/27/2024 0934   LEUKOCYTESUR TRACE (A) 03/27/2024 0934    Sepsis Labs: Lactic Acid, Venous    Component Value Date/Time   LATICACIDVEN 7.6 (HH) 03/27/2024 1529    MICROBIOLOGY: No results found for this or any previous visit (from the past 240 hours).  RADIOLOGY STUDIES/RESULTS: No results found.    LOS: 23 days   Donalda Applebaum, MD  [facility]    To contact the attending provider between 7A-7P or the covering provider during after hours 7P-7A, please log into the web site [email or web address] and access using universal [place] password for that web site. If you do not have the password, please call the hospital operator.  04/19/2024, 12:18 PM

## 2024-04-19 NOTE — Progress Notes (Signed)
 Occupational Therapy Treatment Patient Details Name: Fred Mcintosh MRN: [id number] DOB: 07/01/1961 Today's Date: 04/19/2024   History of present illness 63 y.o. 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 Patient demonstrating gains with OT treatment with mod assist to get to EOB and setup to perform grooming seated on EOB. Patient provided with level 2 therapy band and patient able to perform UB HEP with verbal cues to perform correctly. Bands left on bed rail to allow for biceps strengthening. Patient performed lateral scoots towards HOB with mod to max assist and mod assist to return to supine.  Patient will benefit from continued inpatient follow up therapy, <3 hours/day.  Acute OT to continue to follow to address established goals to facilitate DC to next venue of care.        If plan is discharge home, recommend the following:  Two people to help with walking and/or transfers;Two people to help with bathing/dressing/bathroom;Assistance with cooking/housework;Assistance with feeding;Direct supervision/assist for medications management;Direct supervision/assist for financial management;Assist for transportation;Help with stairs or ramp for entrance   Equipment Recommendations  Other (comment) (defer)    Recommendations for Other Services      Precautions / Restrictions Precautions Precautions: Fall;Other (comment) Recall of Precautions/Restrictions: Impaired Precaution/Restrictions Comments: L AKA, sacral wound Restrictions Weight Bearing Restrictions Per Provider Order: Yes LLE Weight Bearing Per Provider Order: Non weight bearing       Mobility Bed Mobility Overal bed mobility: Needs Assistance Bed Mobility: Supine to Sit, Sit to Supine      Supine to sit: Mod assist, HOB elevated, Used rails Sit to supine: Mod assist, Used rails   General bed mobility comments: increased time and assistance with trunk    Transfers Overall transfer level: Needs assistance                Lateral/Scoot Transfers: Mod assist General transfer comment: declined OOB, performed lateral scoots towards HOB with mod assist     Balance Overall balance assessment: Needs assistance Sitting-balance support: Feet supported, No upper extremity supported Sitting balance-Leahy Scale: Good Sitting balance - Comments: able to perform grooming and UB HEP seated on EOB                                   ADL either performed or assessed with clinical judgement   ADL Overall ADL's : Needs assistance/impaired     Grooming: Wash/dry hands;Wash/dry face;Oral care;Set up;Sitting Grooming Details (indicate cue type and reason): EOB                                    Extremity/Trunk Assessment              Vision       Perception     Praxis     Communication Communication Communication: No apparent difficulties   Cognition Arousal: Alert Behavior During Therapy: WFL for tasks assessed/performed Cognition: Cognition impaired     Awareness: Intellectual awareness intact, Online awareness impaired Memory impairment (select all impairments): Short-term memory Attention impairment (select first level of impairment): Selective attention Executive functioning impairment (select all impairments): Initiation, Problem solving  Following commands: Intact Following commands impaired: Follows one step commands inconsistently, Follows one step commands with increased time      Cueing   Cueing Techniques: Verbal cues  Exercises Exercises: General Upper Extremity General Exercises - Upper Extremity Shoulder Flexion: Strengthening, Both, 10 reps, Seated, Theraband Theraband Level (Shoulder  Flexion): Level 2 (Red) Shoulder Horizontal ABduction: Strengthening, Both, 10 reps, Seated, Theraband Theraband Level (Shoulder Horizontal Abduction): Level 2 (Red) Elbow Flexion: Strengthening, Both, 10 reps, Seated, Theraband Theraband Level (Elbow Flexion): Level 2 (Red) Elbow Extension: Strengthening, Both, 10 reps, Seated, Theraband Theraband Level (Elbow Extension): Level 2 (Red)    Shoulder Instructions       General Comments      Pertinent Vitals/ Pain       Pain Assessment Pain Assessment: Faces Faces Pain Scale: Hurts little more Pain Location: LLE phantom limb pain Pain Descriptors / Indicators: Grimacing, Guarding Pain Intervention(s): Monitored during session, Repositioned  Home Living                                          Prior Functioning/Environment              Frequency  Min 2X/week        Progress Toward Goals  OT Goals(current goals can now be found in the care plan section)  Progress towards OT goals: Progressing toward goals  Acute Rehab OT Goals Patient Stated Goal: to get better OT Goal Formulation: With patient Time For Goal Achievement: 04/24/24 Potential to Achieve Goals: Fair ADL Goals Pt Will Perform Upper Body Bathing: with min assist;sitting Pt Will Perform Upper Body Dressing: with set-up;sitting Pt/caregiver will Perform Home Exercise Program: Increased strength;Both right and left upper extremity;With Supervision;With theraband Additional ADL Goal #1: Pt will perform hand to face/mouth as a precursor to self feeding and grooming. Additional ADL Goal #2: Pt will perform supine to sit at EOB with moderate assistance in preparation for ADLs. Additional ADL Goal #3: Pt will demonstrate selective attention in preparation for ADLs.  Plan      Co-evaluation                 AM-PAC OT 6 Clicks Daily Activity     Outcome Measure   Help from another person eating meals?: None Help from another  person taking care of personal grooming?: A Little Help from another person toileting, which includes using toliet, bedpan, or urinal?: Total Help from another person bathing (including washing, rinsing, drying)?: A Lot Help from another person to put on and taking off regular upper body clothing?: A Little Help from another person to put on and taking off regular lower body clothing?: Total 6 Click Score: 14    End of Session    OT Visit Diagnosis: Muscle weakness (generalized) (M62.81);Other symptoms and signs involving cognitive function   Activity Tolerance Patient tolerated treatment well   Patient Left in bed;with call bell/phone within reach;with bed alarm set   Nurse Communication Mobility status        Time: 9052-8985 OT Time Calculation (min): 27 min  Charges: OT General Charges $OT Visit: 1 Visit OT Treatments $Self Care/Home Management : 8-22 mins $Therapeutic Exercise: 8-22 mins  Dick Laine, OTA Acute Rehabilitation Services  Office [phone]   Fred Mcintosh Laine 04/19/2024, 2:25 PM

## 2024-04-19 NOTE — TOC Progression Note (Signed)
 Transition of Care [facility]) - Progression Note    Patient Details  Name: Fred Mcintosh MRN: [id number] Date of Birth: September 28, 1960  Transition of Care [facility]) CM/SW Contact  Inocente GORMAN Kindle, LCSW Phone Number: 04/19/2024, 9:20 AM  Clinical Narrative:    CSW continuing to follow. [place] [employer] for [employer] CM.    Expected Discharge Plan: Skilled Nursing Facility Barriers to Discharge: Insurance Authorization, SNF Pending bed offer, Continued Medical Work up               Expected Discharge Plan and Services In-house Referral: Clinical Social Work   Post Acute Care Choice: Skilled Nursing Facility Living arrangements for the past 2 months: Single Family Home                                       Social Drivers of Health (SDOH) Interventions SDOH Screenings   Food Insecurity: No Food Insecurity (03/08/2024)  Housing: Low Risk  (03/08/2024)  Transportation Needs: No Transportation Needs (03/08/2024)  Utilities: Not At Risk (03/08/2024)  Social Connections: Moderately Integrated (01/18/2024)  Tobacco Use: Unknown (03/27/2024)    Readmission Risk Interventions     No data to display

## 2024-04-20 DIAGNOSIS — M726 Necrotizing fasciitis: Secondary | ICD-10-CM | POA: Diagnosis not present

## 2024-04-20 LAB — CBC
HCT: 26.8 % — ABNORMAL LOW (ref 39.0–52.0)
Hemoglobin: 8.7 g/dL — ABNORMAL LOW (ref 13.0–17.0)
MCH: 30 pg (ref 26.0–34.0)
MCHC: 32.5 g/dL (ref 30.0–36.0)
MCV: 92.4 fL (ref 80.0–100.0)
Platelets: 253 K/uL (ref 150–400)
RBC: 2.9 MIL/uL — ABNORMAL LOW (ref 4.22–5.81)
RDW: 19.3 % — ABNORMAL HIGH (ref 11.5–15.5)
WBC: 4.5 K/uL (ref 4.0–10.5)
nRBC: 0 % (ref 0.0–0.2)

## 2024-04-20 LAB — BASIC METABOLIC PANEL WITH GFR
Anion gap: 8 (ref 5–15)
BUN: 12 mg/dL (ref 8–23)
CO2: 25 mmol/L (ref 22–32)
Calcium: 8.1 mg/dL — ABNORMAL LOW (ref 8.9–10.3)
Chloride: 106 mmol/L (ref 98–111)
Creatinine, Ser: 1.03 mg/dL (ref 0.61–1.24)
GFR, Estimated: >60 mL/min (ref >60)
Glucose, Bld: 69 mg/dL — ABNORMAL LOW (ref 70–99)
Potassium: 3.8 mmol/L (ref 3.5–5.1)
Sodium: 139 mmol/L (ref 135–145)

## 2024-04-20 NOTE — Progress Notes (Signed)
 PROGRESS NOTE        PATIENT DETAILS Name: Fred Mcintosh Age: 63 y.o. Sex: male Date of Birth: 1960-08-10 Admit Date: 03/27/2024 Admitting Physician Valinda Novas, MD ERE:Ypoo, Elna, MD  Brief Summary: Patient is a 63 y.o.  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  Admitted to the ICU-orthopedic consulted-underwent left AKA-subsequently extubated-stabilized and transferred to [facility].  Significant events: 9/10>> septic shock-admit to ICU-underwent left AKA 9/11>> extubated. 9/15>> transferred to [facility].  Significant studies: 9/10>> CXR: No active disease. 9/10>> left foot x-ray: There is surrounding TMA stump-as well as lower calf. 9/10>> x-ray left tibia/fibula: Surrounding TMA stump-and lower half of leg. 9/14>> TTE: EF 70-75%, grade 1 diastolic dysfunction 916>> TEE: No vegetation. 9/17>> CT head: No acute abnormality 9/22>> MRI brain: No acute intracranial abnormality.  Significant microbiology data: 9/10>> blood culture: Streptococcus anginosus. 9/12>> blood culture: No growth  Procedures: 9/10>> left AKA 9/16>> TEE  Consults: Orthopedic PCCM Infectious disease  Subjective: Patient in bed, appears comfortable, denies any headache, no fever, no chest pain or pressure, no shortness of breath , no abdominal pain. No focal weakness.  Objective: Vitals: Blood pressure 126/72, pulse 94, temperature 98.7 F (37.1 C), temperature source Oral, resp. rate 20, height 6' 2 (1.88 m), weight 114.5 kg, SpO2 97%.   Exam:  Awake Alert, No new F.N deficits, Normal affect .AT,PERRAL Supple Neck, No JVD,   Symmetrical Chest wall movement, Good air movement bilaterally, CTAB RRR,No Gallops, Rubs or new Murmurs,  +ve B.Sounds, Abd Soft, No tenderness,   Left AKA stump site clear with bandage on  it   Assessment/Plan:  Septic shock secondary to left lower extremity necrotizing fasciitis and streptococcal bacteremia Sepsis physiology has resolved S/p left AKA by Dr. Harden on 9/10 TEE negative for endocarditis IV penicillin  G completed 9/26.  Acute metabolic encephalopathy Secondary to sepsis physiology Neuroimaging negative Improved-awake alert this morning  Vomiting No further vomiting Supportive care with antiemetics.   Normocytic anemia  Likely secondary to critical/acute illness Follow CBC periodically  Hypothyroidism Synthroid   HTN BP stable Currently not on any antihypertensives  Bronchial asthma Stable/no wheezing As needed bronchodilators.  DM-2 (A1c 6.7 on 9/10) CBG stable Lantus  15 units daily+ 4 units 3 times daily with meals+ SSI  Recent Labs    04/19/24 0802 04/19/24 0820 04/19/24 1149  GLUCAP 79 80 108*    Pressure Ulcer: Wound 03/27/24 Pressure Injury Buttocks Left Stage 3 -  Full thickness tissue loss. Subcutaneous fat may be visible but bone, tendon or muscle are NOT exposed. (Active)     Wound 03/27/24 Pressure Injury Sacrum Stage 3 -  Full thickness tissue loss. Subcutaneous fat may be visible but bone, tendon or muscle are NOT exposed. (Active)     Wound 04/10/24 1758 Pressure Injury Thigh Posterior;Proximal;Right Stage 3 -  Full thickness tissue loss. Subcutaneous fat may be visible but bone, tendon or muscle are NOT exposed. (Active)    Class I obesity Estimated body mass index is 32.41 kg/m as calculated from the following:   Height as of this encounter: 6' 2 (1.88 m).   Weight as of this encounter: 114.5 kg.   Code status:   Code Status: Full Code   DVT Prophylaxis: SCD's Start: 03/27/24 1747 heparin  injection  5,000 Units Start: 03/27/24 1400   Family Communication: None at bedside   Disposition Plan: Status is: Inpatient Remains inpatient appropriate because: Severity of illness   Planned Discharge  Destination:Skilled nursing facility-medically stable-awaiting bed   Diet: Diet Order             Diet regular Room service appropriate? No; Fluid consistency: Thin  Diet effective now                   Data Review:   Patient Lines/Drains/Airways Status     Active Line/Drains/Airways     Name Placement date Placement time Site Days   Peripheral IV 04/03/24 20 G 1.88 Anterior;Distal;Right Forearm 04/03/24  1320  Forearm  17   Wound 03/27/24 Pressure Injury Buttocks Left Stage 3 -  Full thickness tissue loss. Subcutaneous fat may be visible but bone, tendon or muscle are NOT exposed. 03/27/24  --  Buttocks  24   Wound 03/27/24 Pressure Injury Sacrum Stage 3 -  Full thickness tissue loss. Subcutaneous fat may be visible but bone, tendon or muscle are NOT exposed. 03/27/24  --  Sacrum  24   Wound 03/27/24 1603 Surgical Closed Surgical Incision Leg Left 03/27/24  1603  Leg  24   Wound 04/10/24 1758 Pressure Injury Thigh Posterior;Proximal;Right Stage 3 -  Full thickness tissue loss. Subcutaneous fat may be visible but bone, tendon or muscle are NOT exposed. 04/10/24  1758  Thigh  10             Inpatient Medications  Scheduled Meds:  sodium chloride    Intravenous Once   acetaminophen   650 mg Oral Q6H WA   vitamin C   1,000 mg Oral Daily   heparin   5,000 Units Subcutaneous Q8H   insulin  aspart  0-15 Units Subcutaneous TID WC   insulin  aspart  0-5 Units Subcutaneous QHS   insulin  aspart  4 Units Subcutaneous TID WC   insulin  glargine  15 Units Subcutaneous Q24H   leptospermum manuka honey  1 Application Topical Daily   levothyroxine   50 mcg Oral Q0600   mirtazapine   30 mg Oral QHS   pantoprazole   40 mg Oral Daily   polyethylene glycol  17 g Oral Daily   Continuous Infusions: PRN Meds:.albuterol , alum & mag hydroxide-simeth, dextrose , hydrALAZINE , labetalol , loperamide, methocarbamol  (ROBAXIN ) injection, ondansetron  (ZOFRAN ) IV, mouth rinse, oxyCODONE   DVT  Prophylaxis  SCD's Start: 03/27/24 1747 heparin  injection 5,000 Units Start: 03/27/24 1400       Recent Labs  Lab 04/14/24 0426 04/20/24 0528  WBC 5.3 4.5  HGB 7.6* 8.7*  HCT 23.7* 26.8*  PLT 142* 253  MCV 89.8 92.4  MCH 28.8 30.0  MCHC 32.1 32.5  RDW 18.6* 19.3*    Recent Labs  Lab 04/14/24 0426 04/20/24 0528  NA 134* 139  K 4.1 3.8  CL 103 106  CO2 25 25  ANIONGAP 6 8  GLUCOSE 157* 69*  BUN 22 12  CREATININE 1.05 1.03  CALCIUM  8.1* 8.1*      Recent Labs  Lab 04/14/24 0426 04/20/24 0528  CALCIUM  8.1* 8.1*    --------------------------------------------------------------------------------------------------------------- Lab Results  Component Value Date   CHOL 166 01/18/2024   HDL 15 (L) 01/18/2024   LDLCALC 127 (H) 01/18/2024   TRIG 169 (H) 03/28/2024   CHOLHDL 11.1 01/18/2024    Lab Results  Component Value Date   HGBA1C 6.7 (H) 03/27/2024    Signature  -   Lavada Stank M.D on 04/20/2024 at 10:12 AM   -  To page go to [email or web address]

## 2024-04-20 NOTE — Plan of Care (Signed)
  Problem: Education: Goal: Knowledge of General Education information will improve Description: Including pain rating scale, medication(s)/side effects and non-pharmacologic comfort measures Outcome: Progressing   Problem: Health Behavior/Discharge Planning: Goal: Ability to manage health-related needs will improve Outcome: Progressing   Problem: Clinical Measurements: Goal: Ability to maintain clinical measurements within normal limits will improve Outcome: Progressing Goal: Will remain free from infection Outcome: Progressing Goal: Diagnostic test results will improve Outcome: Progressing Goal: Respiratory complications will improve Outcome: Progressing Goal: Cardiovascular complication will be avoided Outcome: Progressing   Problem: Activity: Goal: Risk for activity intolerance will decrease Outcome: Progressing   Problem: Nutrition: Goal: Adequate nutrition will be maintained Outcome: Progressing   Problem: Coping: Goal: Level of anxiety will decrease Outcome: Progressing   Problem: Elimination: Goal: Will not experience complications related to bowel motility Outcome: Progressing Goal: Will not experience complications related to urinary retention Outcome: Progressing   Problem: Pain Managment: Goal: General experience of comfort will improve and/or be controlled Outcome: Progressing   Problem: Safety: Goal: Ability to remain free from injury will improve Outcome: Progressing   Problem: Skin Integrity: Goal: Risk for impaired skin integrity will decrease Outcome: Progressing   Problem: Education: Goal: Ability to describe self-care measures that may prevent or decrease complications (Diabetes Survival Skills Education) will improve Outcome: Progressing Goal: Individualized Educational Video(s) Outcome: Progressing   Problem: Coping: Goal: Ability to adjust to condition or change in health will improve Outcome: Progressing   Problem: Fluid  Volume: Goal: Ability to maintain a balanced intake and output will improve Outcome: Progressing   Problem: Health Behavior/Discharge Planning: Goal: Ability to identify and utilize available resources and services will improve Outcome: Progressing Goal: Ability to manage health-related needs will improve Outcome: Progressing   Problem: Metabolic: Goal: Ability to maintain appropriate glucose levels will improve Outcome: Progressing   Problem: Nutritional: Goal: Maintenance of adequate nutrition will improve Outcome: Progressing Goal: Progress toward achieving an optimal weight will improve Outcome: Progressing   Problem: Skin Integrity: Goal: Risk for impaired skin integrity will decrease Outcome: Progressing   Problem: Tissue Perfusion: Goal: Adequacy of tissue perfusion will improve Outcome: Progressing   Problem: Education: Goal: Knowledge of the prescribed therapeutic regimen will improve Outcome: Progressing Goal: Ability to verbalize activity precautions or restrictions will improve Outcome: Progressing Goal: Understanding of discharge needs will improve Outcome: Progressing   Problem: Activity: Goal: Ability to perform//tolerate increased activity and mobilize with assistive devices will improve Outcome: Progressing   Problem: Clinical Measurements: Goal: Postoperative complications will be avoided or minimized Outcome: Progressing   Problem: Self-Care: Goal: Ability to meet self-care needs will improve Outcome: Progressing   Problem: Self-Concept: Goal: Ability to maintain and perform role responsibilities to the fullest extent possible will improve Outcome: Progressing   Problem: Pain Management: Goal: Pain level will decrease with appropriate interventions Outcome: Progressing   Problem: Skin Integrity: Goal: Demonstration of wound healing without infection will improve Outcome: Progressing   Problem: Activity: Goal: Ability to tolerate  increased activity will improve Outcome: Progressing   Problem: Respiratory: Goal: Ability to maintain a clear airway and adequate ventilation will improve Outcome: Progressing   Problem: Role Relationship: Goal: Method of communication will improve Outcome: Progressing   Problem: Safety: Goal: Non-violent Restraint(s) Outcome: Progressing

## 2024-04-21 DIAGNOSIS — M726 Necrotizing fasciitis: Secondary | ICD-10-CM | POA: Diagnosis not present

## 2024-04-21 NOTE — Progress Notes (Signed)
 PROGRESS NOTE        PATIENT DETAILS Name: Fred Mcintosh Age: 63 y.o. Sex: male Date of Birth: October 01, 1960 Admit Date: 03/27/2024 Admitting Physician Valinda Novas, MD ERE:Ypoo, Elna, MD  Brief Summary: Patient is a 63 y.o.  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  Admitted to the ICU-orthopedic consulted-underwent left AKA-subsequently extubated-stabilized and transferred to [facility].  Significant events: 9/10>> septic shock-admit to ICU-underwent left AKA 9/11>> extubated. 9/15>> transferred to [facility].  Significant studies: 9/10>> CXR: No active disease. 9/10>> left foot x-ray: There is surrounding TMA stump-as well as lower calf. 9/10>> x-ray left tibia/fibula: Surrounding TMA stump-and lower half of leg. 9/14>> TTE: EF 70-75%, grade 1 diastolic dysfunction 916>> TEE: No vegetation. 9/17>> CT head: No acute abnormality 9/22>> MRI brain: No acute intracranial abnormality.  Significant microbiology data: 9/10>> blood culture: Streptococcus anginosus. 9/12>> blood culture: No growth  Procedures: 9/10>> left AKA 9/16>> TEE  Consults: Orthopedic PCCM Infectious disease  Subjective:  Patient in bed, appears comfortable, denies any headache, no fever, no chest pain or pressure, no shortness of breath , no abdominal pain. No new focal weakness.   Objective: Vitals: Blood pressure 107/80, pulse 82, temperature 98.4 F (36.9 C), temperature source Oral, resp. rate 19, height 6' 2 (1.88 m), weight 114.3 kg, SpO2 96%.   Exam:  Awake Alert, No new F.N deficits, Normal affect [place].AT,PERRAL Supple Neck, No JVD,   Symmetrical Chest wall movement, Good air movement bilaterally, CTAB RRR,No Gallops, Rubs or new Murmurs,  +ve B.Sounds, Abd Soft, No tenderness,   Left AKA stump site clear with bandage on  it   Assessment/Plan:  Septic shock secondary to left lower extremity necrotizing fasciitis and streptococcal bacteremia Sepsis physiology has resolved S/p left AKA by Dr. Harden on 9/10 TEE negative for endocarditis IV penicillin  G completed 9/26.  Acute metabolic encephalopathy Secondary to sepsis physiology Neuroimaging negative Improved-awake alert this morning  Vomiting No further vomiting Supportive care with antiemetics.   Normocytic anemia  Likely secondary to critical/acute illness Follow CBC periodically  Hypothyroidism Synthroid   HTN BP stable Currently not on any antihypertensives  Bronchial asthma Stable/no wheezing As needed bronchodilators.  DM-2 (A1c 6.7 on 9/10) CBG stable Lantus  15 units daily+ 4 units 3 times daily with meals+ SSI  Recent Labs    04/19/24 0802 04/19/24 0820 04/19/24 1149  GLUCAP 79 80 108*    Pressure Ulcer: Wound 03/27/24 Pressure Injury Buttocks Left Stage 3 -  Full thickness tissue loss. Subcutaneous fat may be visible but bone, tendon or muscle are NOT exposed. (Active)     Wound 03/27/24 1330 Pressure Injury Sacrum Stage 3 -  Full thickness tissue loss. Subcutaneous fat may be visible but bone, tendon or muscle are NOT exposed. (Active)     Wound 04/10/24 1758 Pressure Injury Thigh Posterior;Proximal;Right Stage 3 -  Full thickness tissue loss. Subcutaneous fat may be visible but bone, tendon or muscle are NOT exposed. (Active)    Class I obesity Estimated body mass index is 32.35 kg/m as calculated from the following:   Height as of this encounter: 6' 2 (1.88 m).   Weight as of this encounter: 114.3 kg.   Code status:   Code Status: Full Code   DVT Prophylaxis: SCD's Start:  03/27/24 1747 heparin  injection 5,000 Units Start: 03/27/24 1400   Family Communication: None at bedside   Disposition Plan: Status is: Inpatient Remains inpatient appropriate because: Severity of illness   Planned Discharge  Destination:Skilled nursing facility-medically stable-awaiting bed   Diet: Diet Order             Diet regular Room service appropriate? No; Fluid consistency: Thin  Diet effective now                   Data Review:   Patient Lines/Drains/Airways Status     Active Line/Drains/Airways     Name Placement date Placement time Site Days   Peripheral IV 04/03/24 20 G 1.88 Anterior;Distal;Right Forearm 04/03/24  1320  Forearm  17   Wound 03/27/24 Pressure Injury Buttocks Left Stage 3 -  Full thickness tissue loss. Subcutaneous fat may be visible but bone, tendon or muscle are NOT exposed. 03/27/24  --  Buttocks  24   Wound 03/27/24 Pressure Injury Sacrum Stage 3 -  Full thickness tissue loss. Subcutaneous fat may be visible but bone, tendon or muscle are NOT exposed. 03/27/24  --  Sacrum  24   Wound 03/27/24 1603 Surgical Closed Surgical Incision Leg Left 03/27/24  1603  Leg  24   Wound 04/10/24 1758 Pressure Injury Thigh Posterior;Proximal;Right Stage 3 -  Full thickness tissue loss. Subcutaneous fat may be visible but bone, tendon or muscle are NOT exposed. 04/10/24  1758  Thigh  10             Inpatient Medications  Scheduled Meds:  sodium chloride    Intravenous Once   acetaminophen   650 mg Oral Q6H WA   vitamin C   1,000 mg Oral Daily   heparin   5,000 Units Subcutaneous Q8H   insulin  aspart  0-15 Units Subcutaneous TID WC   insulin  aspart  0-5 Units Subcutaneous QHS   insulin  aspart  4 Units Subcutaneous TID WC   insulin  glargine  15 Units Subcutaneous Q24H   leptospermum manuka honey  1 Application Topical Daily   levothyroxine   50 mcg Oral Q0600   mirtazapine   30 mg Oral QHS   pantoprazole   40 mg Oral Daily   polyethylene glycol  17 g Oral Daily   Continuous Infusions: PRN Meds:.albuterol , alum & mag hydroxide-simeth, dextrose , hydrALAZINE , labetalol , loperamide, methocarbamol  (ROBAXIN ) injection, ondansetron  (ZOFRAN ) IV, mouth rinse, oxyCODONE   DVT  Prophylaxis  SCD's Start: 03/27/24 1747 heparin  injection 5,000 Units Start: 03/27/24 1400       Recent Labs  Lab 04/20/24 0528  WBC 4.5  HGB 8.7*  HCT 26.8*  PLT 253  MCV 92.4  MCH 30.0  MCHC 32.5  RDW 19.3*    Recent Labs  Lab 04/20/24 0528  NA 139  K 3.8  CL 106  CO2 25  ANIONGAP 8  GLUCOSE 69*  BUN 12  CREATININE 1.03  CALCIUM  8.1*      Recent Labs  Lab 04/20/24 0528  CALCIUM  8.1*    --------------------------------------------------------------------------------------------------------------- Lab Results  Component Value Date   CHOL 166 01/18/2024   HDL 15 (L) 01/18/2024   LDLCALC 127 (H) 01/18/2024   TRIG 169 (H) 03/28/2024   CHOLHDL 11.1 01/18/2024    Lab Results  Component Value Date   HGBA1C 6.7 (H) 03/27/2024    Signature  -   Lavada Stank M.D on 04/21/2024 at 8:39 AM   -  To page go to [email or web address]

## 2024-04-21 NOTE — Plan of Care (Signed)
 Plan of care is reviewed. Pt has been progressing. He is on room air at night. SPO2 is low at 65-70s% and monitor alarm.  Pt is able to maintain SPO2 above 99-100% with NCL 3 LPM. No ather distress noted. Pain is well tolerated. No major complaints. We will continue to monitor.   Problem: Clinical Measurements: Goal: Ability to maintain clinical measurements within normal limits will improve Outcome: Progressing Goal: Will remain free from infection Outcome: Progressing Goal: Diagnostic test results will improve Outcome: Progressing Goal: Respiratory complications will improve Outcome: Progressing Goal: Cardiovascular complication will be avoided Outcome: Progressing   Problem: Health Behavior/Discharge Planning: Goal: Ability to manage health-related needs will improve Outcome: Progressing   Problem: Activity: Goal: Risk for activity intolerance will decrease Outcome: Progressing   Problem: Nutrition: Goal: Adequate nutrition will be maintained Outcome: Progressing   Problem: Elimination: Goal: Will not experience complications related to bowel motility Outcome: Progressing Goal: Will not experience complications related to urinary retention Outcome: Progressing   Problem: Coping: Goal: Level of anxiety will decrease Outcome: Progressing   Problem: Activity: Goal: Risk for activity intolerance will decrease Outcome: Progressing   Problem: Skin Integrity: Goal: Risk for impaired skin integrity will decrease Outcome: Progressing   Problem: Metabolic: Goal: Ability to maintain appropriate glucose levels will improve Outcome: Progressing   Problem: Health Behavior/Discharge Planning: Goal: Ability to identify and utilize available resources and services will improve Outcome: Progressing Goal: Ability to manage health-related needs will improve Outcome: Progressing   Wendi Dash, RN

## 2024-04-22 DIAGNOSIS — M726 Necrotizing fasciitis: Secondary | ICD-10-CM | POA: Diagnosis not present

## 2024-04-22 LAB — GLUCOSE, CAPILLARY
Glucose-Capillary: 132 mg/dL — ABNORMAL HIGH (ref 70–99)
Glucose-Capillary: 139 mg/dL — ABNORMAL HIGH (ref 70–99)
Glucose-Capillary: 78 mg/dL (ref 70–99)
Glucose-Capillary: 151 mg/dL — ABNORMAL HIGH (ref 70–99)
Glucose-Capillary: 92 mg/dL (ref 70–99)
Glucose-Capillary: 120 mg/dL — ABNORMAL HIGH (ref 70–99)
Glucose-Capillary: 103 mg/dL — ABNORMAL HIGH (ref 70–99)
Glucose-Capillary: 88 mg/dL (ref 70–99)
Glucose-Capillary: 158 mg/dL — ABNORMAL HIGH (ref 70–99)
Glucose-Capillary: 121 mg/dL — ABNORMAL HIGH (ref 70–99)
Glucose-Capillary: 76 mg/dL (ref 70–99)
Glucose-Capillary: 107 mg/dL — ABNORMAL HIGH (ref 70–99)

## 2024-04-22 NOTE — Progress Notes (Signed)
 Nutrition Follow-up  DOCUMENTATION CODES:  Not applicable  INTERVENTION:  Per MD, vitamin C  1000 mg x 30 days - will complete 10/11  Discontinue Ensure Plus High Protein po 5x daily, each supplement provides 350 kcal and 20 grams of protein   Continue Magic cup TID with meals, each supplement provides 290 kcal and 9 grams of protein   Continue appetite stimulant  Add snacks BID - peanut butter and vanilla wafers  Discussed importance of meeting calorie/protein needs  NUTRITION DIAGNOSIS:  Increased nutrient needs related to wound healing as evidenced by estimated needs.  GOAL:  Patient will meet greater than or equal to 90% of their needs  MONITOR:  Diet advancement, I & O's, Labs, Skin  REASON FOR ASSESSMENT:  Ventilator, Consult Enteral/tube feeding initiation and management  ASSESSMENT:  Pt with hx of HTN, DM type 2, grave's disease, and recent transmetatarsal amputation presented to ED with AMS and weakness. Family reports pt not getting out of bed x 9 days. In ED, left foot noted to be dehisced with exposed bone, foul drainage and subcutaneous emphysema up the shin.  9/10 - presented to ED, Op, left AKA with wound vac placement  9/11 - TF started; extubated 9/12 - Cortrak placed and TF re-initiated 9/14 - Echocardiogram w/ bubble study: Left ventricular  hypertrophy; EF 70-75%; aortic valve sclerosis/calcification 9/15 - THR assumed care 9/16 - TEE; patient pulled Cortrak 9/17 - calorie count started 9/19 - calorie count ended: insufficient; refused replacement of Cortrak; another calorie count initiated 9/21 - calorie count completed: insufficient; appetite stimulant up titrated  9/22 - MRI: no acute intracranial abnormality 9/26 - IV ABX course complete   He is medically stable and awaiting SNF placement. Difficult placement d/t insurance. Mentation status improved.    Average Meal Intake 10/2: 100% x1 documented meal (boiled egg, fruit cup, orange  juice)  Intake improving. Ensure x5 daily has been discontinued as patient reports they were running right through him. Discussed importance of meeting calorie and protein needs consistently to encourage wound healing, as he now has new stage three to pressure injury to L thigh. He verbalized understanding. To date, he has tried and refused [employer], Juven, and [employer]. Refuses supplemental nutrition support via Cortrak. Educated around [employer] and asked if he would be willing to re-initiate. States, I'll drink more milk.  Consumed 100% of breakfast, which consisted of three sausage patties, two boiled eggs, a blueberry muffin, and orange juice. This is estimated: 1475 kcals, 52g protein (61% estimated calorie and 43% estimated protein needs). Desirable for one meal's worth of intake. Sufficiently meeting needs if consuming this degree of intake x2-3 meals per day. He is amicable to receiving vanilla wafers and peanut butter as a snack twice daily, which would account for 360 kcals, and 21g protein per offering, if consuming 100%.    Admit weight: 128.5 kg (first measured s/p AKA)  Current weight: 116.5 kg  +mild, pitting to RLE   Significant weight loss this admission taking into account AKA. Likely somewhat related to fluid shifts and resolution of edema, however has also had prolonged bout of poor PO intake. Weight has remained stable over the last two weeks ranging from 115-119kg.  Edema has improved. Can still not use weight loss as a criteria for malnutrition even though it is likely he is appropriate. Large body habitus likely hiding more significant muscle and fat depletions. Bowels stable. No difficulties noted chewing or swallowing.      Intake/Output Summary (Last  24 hours) at 04/22/2024 1254 Last data filed at 04/22/2024 1233 Gross per 24 hour  Intake 360 ml  Output 1900 ml  Net -1540 ml      Drains/Lines: Foley catheter UOP x 24 hours   Remains on  vitamin/mineral regimen to promote wound healing. He was refusing Juven. Appetite stimulant started 9/19 and continues. Insulin  regimen stable. No adjustments since last assessment.   No new/recent labs for review. Labs drawn two days ago unremarkable. Electrolytes stable.   Meds: ascorbic acid ,  SS Novolog  0-15 TID, SS Novolog  0-5 QHS, SS Novolog  4 TID, Lantus  15 daily, levothyroxine , mirtazapine , pantoprazole , Miralax   Labs from 10/4 reviewed:  Na+ 139 (wdl) K+ 3.8 (wdl) TSH 7.797 (9/10) T3, free 1.4 (9/11) CBGs 69-157 x1 week A1c 6.7 (03/2024)   NUTRITION - FOCUSED PHYSICAL EXAM: Muscle and fat depletions re-assessed today as edema has improved and he has been admitted for almost one month. Depletions stable compared to last assessment three weeks ago. Of note, he does continue some mild, pitting edema to RLE. Note this may be masking more significant muscle and fat depletions. Will continue to monitor intake and weight trends.   Flowsheet Row Most Recent Value  Orbital Region No depletion  Upper Arm Region Mild depletion  Thoracic and Lumbar Region No depletion  Buccal Region No depletion  Temple Region Mild depletion  Clavicle Bone Region Mild depletion  Clavicle and Acromion Bone Region Mild depletion  Scapular Bone Region Mild depletion  Dorsal Hand No depletion  Patellar Region Moderate depletion  [unable to assess L d/t AKA]  Anterior Thigh Region Severe depletion  Posterior Calf Region Moderate depletion  Edema (RD Assessment) Moderate  Hair Reviewed  Eyes Reviewed  Mouth Reviewed  Skin Reviewed  Nails Reviewed    Diet Order:   Diet Order             Diet regular Room service appropriate? No; Fluid consistency: Thin  Diet effective now             EDUCATION NEEDS:  Not appropriate for education at this time  Skin:  Skin Assessment: Skin Integrity Issues: Skin Integrity Issues:: Stage III, Incisions Stage III: R buttocks/sacrum, L thigh (new) Incisions:  closed surgical incision L AKA  Assessed amputation site to LLE. No swelling, edema, redness noted. Appears to be healing well.  Last BM:  10/6 - type 6 x1  Height:  Ht Readings from Last 1 Encounters:  03/27/24 6' 2 (1.88 m)   Weight:  Wt Readings from Last 1 Encounters:  04/22/24 116.5 kg   Ideal Body Weight:  77.7 kg (adjusted by 10% for AKA)  BMI:  Body mass index is 32.98 kg/m.  Estimated Nutritional Needs:   Kcal:  2400-2600 kcal/d  Protein:  120-145 g/d  Fluid:  2.5L/d  Blair Deaner MS, RD, LDN Registered Dietitian Clinical Nutrition RD Inpatient Contact Info in Amion

## 2024-04-22 NOTE — Progress Notes (Signed)
 PROGRESS NOTE        PATIENT DETAILS Name: Fred Mcintosh Age: 63 y.o. Sex: male Date of Birth: [birth year] Admit Date: 03/27/2024 Admitting Physician Valinda Novas, MD ERE:Ypoo, Elna, MD  Brief Summary: Patient is a 63 y.o.  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  Admitted to the ICU-orthopedic consulted-underwent left AKA-subsequently extubated-stabilized and transferred to [facility].  Significant events: 9/10>> septic shock-admit to ICU-underwent left AKA 9/11>> extubated. 9/15>> transferred to [facility].  Significant studies: 9/10>> CXR: No active disease. 9/10>> left foot x-ray: There is surrounding TMA stump-as well as lower calf. 9/10>> x-ray left tibia/fibula: Surrounding TMA stump-and lower half of leg. 9/14>> TTE: EF 70-75%, grade 1 diastolic dysfunction 916>> TEE: No vegetation. 9/17>> CT head: No acute abnormality 9/22>> MRI brain: No acute intracranial abnormality.  Significant microbiology data: 9/10>> blood culture: Streptococcus anginosus. 9/12>> blood culture: No growth  Procedures: 9/10>> left AKA 9/16>> TEE  Consults: Orthopedic PCCM Infectious disease  Subjective: Patient in bed, appears comfortable, denies any headache, no fever, no chest pain or pressure, no shortness of breath , no abdominal pain. No focal weakness.   Objective: Vitals: Blood pressure 121/83, pulse 95, temperature 98.3 F (36.8 C), temperature source Oral, resp. rate 18, height 6' 2 (1.88 m), weight 116.5 kg, SpO2 99%.   Exam:  Awake Alert, No new F.N deficits, Normal affect [place].AT,PERRAL Supple Neck, No JVD,   Symmetrical Chest wall movement, Good air movement bilaterally, CTAB RRR,No Gallops, Rubs or new Murmurs,  +ve B.Sounds, Abd Soft, No tenderness,   Left AKA stump site clear with bandage on  it   Assessment/Plan:  Septic shock secondary to left lower extremity necrotizing fasciitis and streptococcal bacteremia Sepsis physiology has resolved S/p left AKA by Dr. Harden on 9/10 TEE negative for endocarditis IV penicillin  G completed 9/26.  Acute metabolic encephalopathy Secondary to sepsis physiology Neuroimaging negative Improved-awake alert this morning  Vomiting No further vomiting Supportive care with antiemetics.   Normocytic anemia  Likely secondary to critical/acute illness Follow CBC periodically  Hypothyroidism Synthroid   HTN BP stable Currently not on any antihypertensives  Bronchial asthma Stable/no wheezing As needed bronchodilators.  DM-2 (A1c 6.7 on 9/10) CBG stable Lantus  15 units daily+ 4 units 3 times daily with meals+ SSI  Recent Labs    04/19/24 1149  GLUCAP 108*    Pressure Ulcer: Wound 03/27/24 Pressure Injury Buttocks Left Stage 3 -  Full thickness tissue loss. Subcutaneous fat may be visible but bone, tendon or muscle are NOT exposed. (Active)     Wound 03/27/24 1330 Pressure Injury Sacrum Stage 3 -  Full thickness tissue loss. Subcutaneous fat may be visible but bone, tendon or muscle are NOT exposed. (Active)     Wound 04/10/24 1758 Pressure Injury Thigh Posterior;Proximal;Right Stage 3 -  Full thickness tissue loss. Subcutaneous fat may be visible but bone, tendon or muscle are NOT exposed. (Active)    Class I obesity Estimated body mass index is 32.98 kg/m as calculated from the following:   Height as of this encounter: 6' 2 (1.88 m).   Weight as of this encounter: 116.5 kg.   Code status:   Code Status: Full Code   DVT Prophylaxis: SCD's Start: 03/27/24 1747 heparin  injection 5,000 Units Start: 03/27/24  1400   Family Communication: None at bedside   Disposition Plan: Status is: Inpatient Remains inpatient appropriate because: Severity of illness   Planned Discharge Destination:Skilled nursing  facility-medically stable-awaiting bed   Diet: Diet Order             Diet regular Room service appropriate? No; Fluid consistency: Thin  Diet effective now                   Data Review:   Patient Lines/Drains/Airways Status     Active Line/Drains/Airways     Name Placement date Placement time Site Days   Peripheral IV 04/03/24 20 G 1.88 Anterior;Distal;Right Forearm 04/03/24  1320  Forearm  17   Wound 03/27/24 Pressure Injury Buttocks Left Stage 3 -  Full thickness tissue loss. Subcutaneous fat may be visible but bone, tendon or muscle are NOT exposed. 03/27/24  --  Buttocks  24   Wound 03/27/24 Pressure Injury Sacrum Stage 3 -  Full thickness tissue loss. Subcutaneous fat may be visible but bone, tendon or muscle are NOT exposed. 03/27/24  --  Sacrum  24   Wound 03/27/24 1603 Surgical Closed Surgical Incision Leg Left 03/27/24  1603  Leg  24   Wound 04/10/24 1758 Pressure Injury Thigh Posterior;Proximal;Right Stage 3 -  Full thickness tissue loss. Subcutaneous fat may be visible but bone, tendon or muscle are NOT exposed. 04/10/24  1758  Thigh  10             Inpatient Medications  Scheduled Meds:  sodium chloride    Intravenous Once   acetaminophen   650 mg Oral Q6H WA   vitamin C   1,000 mg Oral Daily   heparin   5,000 Units Subcutaneous Q8H   insulin  aspart  0-15 Units Subcutaneous TID WC   insulin  aspart  0-5 Units Subcutaneous QHS   insulin  aspart  4 Units Subcutaneous TID WC   insulin  glargine  15 Units Subcutaneous Q24H   leptospermum manuka honey  1 Application Topical Daily   levothyroxine   50 mcg Oral Q0600   mirtazapine   30 mg Oral QHS   pantoprazole   40 mg Oral Daily   polyethylene glycol  17 g Oral Daily   Continuous Infusions: PRN Meds:.albuterol , alum & mag hydroxide-simeth, dextrose , hydrALAZINE , labetalol , loperamide, methocarbamol  (ROBAXIN ) injection, ondansetron  (ZOFRAN ) IV, mouth rinse, oxyCODONE   DVT Prophylaxis  SCD's Start: 03/27/24  1747 heparin  injection 5,000 Units Start: 03/27/24 1400       Recent Labs  Lab 04/20/24 0528  WBC 4.5  HGB 8.7*  HCT 26.8*  PLT 253  MCV 92.4  MCH 30.0  MCHC 32.5  RDW 19.3*    Recent Labs  Lab 04/20/24 0528  NA 139  K 3.8  CL 106  CO2 25  ANIONGAP 8  GLUCOSE 69*  BUN 12  CREATININE 1.03  CALCIUM  8.1*      Recent Labs  Lab 04/20/24 0528  CALCIUM  8.1*    --------------------------------------------------------------------------------------------------------------- Lab Results  Component Value Date   CHOL 166 01/18/2024   HDL 15 (L) 01/18/2024   LDLCALC 127 (H) 01/18/2024   TRIG 169 (H) 03/28/2024   CHOLHDL 11.1 01/18/2024    Lab Results  Component Value Date   HGBA1C 6.7 (H) 03/27/2024    Signature  -   Lavada Stank M.D on 04/22/2024 at 8:51 AM   -  To page go to [email or web address]

## 2024-04-22 NOTE — Progress Notes (Signed)
 Physical Therapy Treatment Patient Details Name: GIULIANO PREECE MRN: [id number] DOB: [birth year] Today's Date: 04/22/2024   History of Present Illness 63 y.o. 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 PMHx: Lt TMA 03/08/24, T2DM, HTN, obesity, Graves' disease    PT Comments  Pt with fair tolerance to treatment today. Pt declined OOB mobility however was agreeable to sit EOB for 5 minutes and work on strengthening exercises. No change in DC/DME recs at this time. PT will continue to follow.      If plan is discharge home, recommend the following: Two people to help with walking and/or transfers;Two people to help with bathing/dressing/bathroom;Direct supervision/assist for medications management;Assistance with feeding;Assistance with cooking/housework;Direct supervision/assist for financial management;Supervision due to cognitive status;Assist for transportation;Help with stairs or ramp for entrance   Can travel by private vehicle     No  Equipment Recommendations  Wheelchair (measurements PT);Wheelchair cushion (measurements PT);BSC/3in1;Hospital bed;Hoyer lift    Recommendations for Other Services       Precautions / Restrictions Precautions Precautions: Fall;Other (comment) Recall of Precautions/Restrictions: Impaired Precaution/Restrictions Comments: L AKA, sacral wound Required Braces or Orthoses: Other Brace Other Brace: Post-Op Shoe LLE Restrictions Weight Bearing Restrictions Per Provider Order: Yes LLE Weight Bearing Per Provider Order: Weight bearing as tolerated     Mobility  Bed Mobility Overal bed mobility: Needs Assistance Bed Mobility: Supine to Sit, Sit to Supine     Supine to sit: Mod assist, HOB elevated, Used rails Sit to supine: Min assist   General bed mobility comments: increased time and assistance with trunk. HHA to raise up. Assistance with RLE  back to bed.    Transfers Overall transfer level: Needs assistance Equipment used: None Transfers: Bed to chair/wheelchair/BSC            Lateral/Scoot Transfers: Contact guard assist, Min assist General transfer comment: declined OOB, performed lateral scoots towards HOB with min assist    Ambulation/Gait               General Gait Details: unable   Stairs             Wheelchair Mobility     Tilt Bed    Modified Rankin (Stroke Patients Only)       Balance Overall balance assessment: Needs assistance Sitting-balance support: Feet supported, No upper extremity supported Sitting balance-Leahy Scale: Good                                      Communication Communication Communication: No apparent difficulties  Cognition Arousal: Alert Behavior During Therapy: WFL for tasks assessed/performed   PT - Cognitive impairments: No family/caregiver present to determine baseline, Problem solving, Safety/Judgement, Awareness                       PT - Cognition Comments: pt with depressed spirits but actively participated with encouragement from therapist. Following commands: Intact Following commands impaired: Follows one step commands inconsistently, Follows one step commands with increased time    Cueing Cueing Techniques: Verbal cues  Exercises General Exercises - Lower Extremity Long Arc Quad: Right, Seated, 10 reps, Strengthening, AROM    General Comments General comments (skin integrity, edema, etc.): VSS      Pertinent Vitals/Pain Pain Assessment Pain Assessment: Faces Faces Pain Scale: Hurts little more  Pain Location: LLE phantom limb pain Pain Descriptors / Indicators: Grimacing, Guarding Pain Intervention(s): Limited activity within patient's tolerance, Monitored during session, Premedicated before session, Repositioned    Home Living                          Prior Function            PT Goals  (current goals can now be found in the care plan section) Progress towards PT goals: Progressing toward goals    Frequency    Min 2X/week      PT Plan      Co-evaluation              AM-PAC PT 6 Clicks Mobility   Outcome Measure  Help needed turning from your back to your side while in a flat bed without using bedrails?: A Lot Help needed moving from lying on your back to sitting on the side of a flat bed without using bedrails?: A Lot Help needed moving to and from a bed to a chair (including a wheelchair)?: Total Help needed standing up from a chair using your arms (e.g., wheelchair or bedside chair)?: Total Help needed to walk in hospital room?: Total Help needed climbing 3-5 steps with a railing? : Total 6 Click Score: 8    End of Session Equipment Utilized During Treatment: Oxygen Activity Tolerance: Patient tolerated treatment well Patient left: in bed;with call bell/phone within reach;with bed alarm set Nurse Communication: Mobility status PT Visit Diagnosis: Difficulty in walking, not elsewhere classified (R26.2);Unsteadiness on feet (R26.81);Other abnormalities of gait and mobility (R26.89);Muscle weakness (generalized) (M62.81)     Time: 8541-8483 PT Time Calculation (min) (ACUTE ONLY): 18 min  Charges:    $Therapeutic Activity: 8-22 mins PT General Charges $$ ACUTE PT VISIT: 1 Visit                     Stephan Draughn B, PT, DPT Acute Rehab Services [phone]    Audel Coakley 04/22/2024, 4:15 PM

## 2024-04-22 NOTE — TOC Progression Note (Signed)
 Transition of Care [facility]) - Progression Note    Patient Details  Name: Fred Mcintosh MRN: [id number] Date of Birth: 1961-04-14  [facility]) CM/SW Contact  Inocente GORMAN Kindle, LCSW Phone Number: 04/22/2024, 9:00 AM  Clinical Narrative:    Inpatient Care Management continuing to follow. Discharge barriers remain in place.    Expected Discharge Plan: Skilled Nursing Facility Barriers to Discharge: Inadequate or no insurance, SNF Pending bed offer               Expected Discharge Plan and Services In-house Referral: Clinical Social Work   Post Acute Care Choice: Skilled Nursing Facility Living arrangements for the past 2 months: Single Family Home                                       Social Drivers of Health (SDOH) Interventions SDOH Screenings   Food Insecurity: No Food Insecurity (03/08/2024)  Housing: Low Risk  (03/08/2024)  Transportation Needs: No Transportation Needs (03/08/2024)  Utilities: Not At Risk (03/08/2024)  Social Connections: Moderately Integrated (01/18/2024)  Tobacco Use: Unknown (03/27/2024)    Readmission Risk Interventions     No data to display

## 2024-04-22 NOTE — Plan of Care (Signed)
  Problem: Education: Goal: Knowledge of General Education information will improve Description: Including pain rating scale, medication(s)/side effects and non-pharmacologic comfort measures Outcome: Progressing   Problem: Health Behavior/Discharge Planning: Goal: Ability to manage health-related needs will improve Outcome: Progressing   Problem: Clinical Measurements: Goal: Ability to maintain clinical measurements within normal limits will improve Outcome: Progressing Goal: Will remain free from infection Outcome: Progressing Goal: Diagnostic test results will improve Outcome: Progressing Goal: Respiratory complications will improve Outcome: Progressing Goal: Cardiovascular complication will be avoided Outcome: Progressing   Problem: Activity: Goal: Risk for activity intolerance will decrease Outcome: Progressing   Problem: Nutrition: Goal: Adequate nutrition will be maintained Outcome: Progressing   Problem: Coping: Goal: Level of anxiety will decrease Outcome: Progressing   Problem: Elimination: Goal: Will not experience complications related to bowel motility Outcome: Progressing Goal: Will not experience complications related to urinary retention Outcome: Progressing   Problem: Pain Managment: Goal: General experience of comfort will improve and/or be controlled Outcome: Progressing   Problem: Safety: Goal: Ability to remain free from injury will improve Outcome: Progressing   Problem: Skin Integrity: Goal: Risk for impaired skin integrity will decrease Outcome: Progressing   Problem: Education: Goal: Ability to describe self-care measures that may prevent or decrease complications (Diabetes Survival Skills Education) will improve Outcome: Progressing Goal: Individualized Educational Video(s) Outcome: Progressing   Problem: Coping: Goal: Ability to adjust to condition or change in health will improve Outcome: Progressing   Problem: Fluid  Volume: Goal: Ability to maintain a balanced intake and output will improve Outcome: Progressing   Problem: Health Behavior/Discharge Planning: Goal: Ability to identify and utilize available resources and services will improve Outcome: Progressing Goal: Ability to manage health-related needs will improve Outcome: Progressing   Problem: Metabolic: Goal: Ability to maintain appropriate glucose levels will improve Outcome: Progressing   Problem: Nutritional: Goal: Maintenance of adequate nutrition will improve Outcome: Progressing Goal: Progress toward achieving an optimal weight will improve Outcome: Progressing   Problem: Skin Integrity: Goal: Risk for impaired skin integrity will decrease Outcome: Progressing   Problem: Tissue Perfusion: Goal: Adequacy of tissue perfusion will improve Outcome: Progressing   Problem: Education: Goal: Knowledge of the prescribed therapeutic regimen will improve Outcome: Progressing Goal: Ability to verbalize activity precautions or restrictions will improve Outcome: Progressing Goal: Understanding of discharge needs will improve Outcome: Progressing   Problem: Activity: Goal: Ability to perform//tolerate increased activity and mobilize with assistive devices will improve Outcome: Progressing   Problem: Clinical Measurements: Goal: Postoperative complications will be avoided or minimized Outcome: Progressing   Problem: Self-Care: Goal: Ability to meet self-care needs will improve Outcome: Progressing   Problem: Self-Concept: Goal: Ability to maintain and perform role responsibilities to the fullest extent possible will improve Outcome: Progressing   Problem: Pain Management: Goal: Pain level will decrease with appropriate interventions Outcome: Progressing   Problem: Skin Integrity: Goal: Demonstration of wound healing without infection will improve Outcome: Progressing   Problem: Activity: Goal: Ability to tolerate  increased activity will improve Outcome: Progressing   Problem: Respiratory: Goal: Ability to maintain a clear airway and adequate ventilation will improve Outcome: Progressing   Problem: Role Relationship: Goal: Method of communication will improve Outcome: Progressing   Problem: Safety: Goal: Non-violent Restraint(s) Outcome: Progressing

## 2024-04-23 ENCOUNTER — Inpatient Hospital Stay: Payer: Self-pay | Admitting: Internal Medicine

## 2024-04-23 DIAGNOSIS — M726 Necrotizing fasciitis: Secondary | ICD-10-CM | POA: Diagnosis not present

## 2024-04-23 LAB — CBC WITH DIFFERENTIAL/PLATELET
Abs Immature Granulocytes: 0.02 K/uL (ref 0.00–0.07)
Basophils Absolute: 0 K/uL (ref 0.0–0.1)
Basophils Relative: 0 %
Eosinophils Absolute: 0.1 K/uL (ref 0.0–0.5)
Eosinophils Relative: 2 %
HCT: 29 % — ABNORMAL LOW (ref 39.0–52.0)
Hemoglobin: 9.5 g/dL — ABNORMAL LOW (ref 13.0–17.0)
Immature Granulocytes: 0 %
Lymphocytes Relative: 37 %
Lymphs Abs: 1.7 K/uL (ref 0.7–4.0)
MCH: 30.4 pg (ref 26.0–34.0)
MCHC: 32.8 g/dL (ref 30.0–36.0)
MCV: 92.9 fL (ref 80.0–100.0)
Monocytes Absolute: 0.3 K/uL (ref 0.1–1.0)
Monocytes Relative: 7 %
Neutro Abs: 2.5 K/uL (ref 1.7–7.7)
Neutrophils Relative %: 54 %
Platelets: 228 K/uL (ref 150–400)
RBC: 3.12 MIL/uL — ABNORMAL LOW (ref 4.22–5.81)
RDW: 18.3 % — ABNORMAL HIGH (ref 11.5–15.5)
WBC: 4.7 K/uL (ref 4.0–10.5)
nRBC: 0 % (ref 0.0–0.2)

## 2024-04-23 LAB — BASIC METABOLIC PANEL WITH GFR
Anion gap: 8 (ref 5–15)
BUN: 15 mg/dL (ref 8–23)
CO2: 25 mmol/L (ref 22–32)
Calcium: 8.4 mg/dL — ABNORMAL LOW (ref 8.9–10.3)
Chloride: 105 mmol/L (ref 98–111)
Creatinine, Ser: 1.06 mg/dL (ref 0.61–1.24)
GFR, Estimated: >60 mL/min (ref >60)
Glucose, Bld: 108 mg/dL — ABNORMAL HIGH (ref 70–99)
Potassium: 4.3 mmol/L (ref 3.5–5.1)
Sodium: 138 mmol/L (ref 135–145)

## 2024-04-23 LAB — GLUCOSE, CAPILLARY
Glucose-Capillary: 105 mg/dL — ABNORMAL HIGH (ref 70–99)
Glucose-Capillary: 138 mg/dL — ABNORMAL HIGH (ref 70–99)
Glucose-Capillary: 138 mg/dL — ABNORMAL HIGH (ref 70–99)
Glucose-Capillary: 107 mg/dL — ABNORMAL HIGH (ref 70–99)

## 2024-04-23 LAB — MAGNESIUM: Magnesium: 2 mg/dL (ref 1.7–2.4)

## 2024-04-23 LAB — PHOSPHORUS: Phosphorus: 4 mg/dL (ref 2.5–4.6)

## 2024-04-23 NOTE — Plan of Care (Signed)
  Problem: Education: Goal: Knowledge of General Education information will improve Description: Including pain rating scale, medication(s)/side effects and non-pharmacologic comfort measures Outcome: Progressing   Problem: Health Behavior/Discharge Planning: Goal: Ability to manage health-related needs will improve Outcome: Progressing   Problem: Clinical Measurements: Goal: Ability to maintain clinical measurements within normal limits will improve Outcome: Progressing Goal: Will remain free from infection Outcome: Progressing Goal: Diagnostic test results will improve Outcome: Progressing Goal: Respiratory complications will improve Outcome: Progressing Goal: Cardiovascular complication will be avoided Outcome: Progressing   Problem: Activity: Goal: Risk for activity intolerance will decrease Outcome: Progressing   Problem: Nutrition: Goal: Adequate nutrition will be maintained Outcome: Progressing   Problem: Coping: Goal: Level of anxiety will decrease Outcome: Progressing   Problem: Elimination: Goal: Will not experience complications related to bowel motility Outcome: Progressing Goal: Will not experience complications related to urinary retention Outcome: Progressing   Problem: Pain Managment: Goal: General experience of comfort will improve and/or be controlled Outcome: Progressing   Problem: Safety: Goal: Ability to remain free from injury will improve Outcome: Progressing   Problem: Skin Integrity: Goal: Risk for impaired skin integrity will decrease Outcome: Progressing   Problem: Education: Goal: Ability to describe self-care measures that may prevent or decrease complications (Diabetes Survival Skills Education) will improve Outcome: Progressing Goal: Individualized Educational Video(s) Outcome: Progressing   Problem: Coping: Goal: Ability to adjust to condition or change in health will improve Outcome: Progressing   Problem: Fluid  Volume: Goal: Ability to maintain a balanced intake and output will improve Outcome: Progressing   Problem: Health Behavior/Discharge Planning: Goal: Ability to identify and utilize available resources and services will improve Outcome: Progressing Goal: Ability to manage health-related needs will improve Outcome: Progressing   Problem: Metabolic: Goal: Ability to maintain appropriate glucose levels will improve Outcome: Progressing   Problem: Nutritional: Goal: Maintenance of adequate nutrition will improve Outcome: Progressing Goal: Progress toward achieving an optimal weight will improve Outcome: Progressing   Problem: Skin Integrity: Goal: Risk for impaired skin integrity will decrease Outcome: Progressing   Problem: Tissue Perfusion: Goal: Adequacy of tissue perfusion will improve Outcome: Progressing   Problem: Education: Goal: Knowledge of the prescribed therapeutic regimen will improve Outcome: Progressing Goal: Ability to verbalize activity precautions or restrictions will improve Outcome: Progressing Goal: Understanding of discharge needs will improve Outcome: Progressing   Problem: Activity: Goal: Ability to perform//tolerate increased activity and mobilize with assistive devices will improve Outcome: Progressing   Problem: Clinical Measurements: Goal: Postoperative complications will be avoided or minimized Outcome: Progressing   Problem: Self-Care: Goal: Ability to meet self-care needs will improve Outcome: Progressing   Problem: Self-Concept: Goal: Ability to maintain and perform role responsibilities to the fullest extent possible will improve Outcome: Progressing   Problem: Pain Management: Goal: Pain level will decrease with appropriate interventions Outcome: Progressing   Problem: Skin Integrity: Goal: Demonstration of wound healing without infection will improve Outcome: Progressing   Problem: Activity: Goal: Ability to tolerate  increased activity will improve Outcome: Progressing   Problem: Respiratory: Goal: Ability to maintain a clear airway and adequate ventilation will improve Outcome: Progressing   Problem: Role Relationship: Goal: Method of communication will improve Outcome: Progressing   Problem: Safety: Goal: Non-violent Restraint(s) Outcome: Progressing

## 2024-04-23 NOTE — Progress Notes (Signed)
 PROGRESS NOTE        PATIENT DETAILS Name: Fred Mcintosh Age: 63 y.o. Sex: male Date of Birth: 1961/02/11 Admit Date: 03/27/2024 Admitting Physician Valinda Novas, MD ERE:Ypoo, Elna, MD  Brief Summary: Patient is a 63 y.o.  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  Admitted to the ICU-orthopedic consulted-underwent left AKA-subsequently extubated-stabilized and transferred to [facility].  Significant events: 9/10>> septic shock-admit to ICU-underwent left AKA 9/11>> extubated. 9/15>> transferred to [facility].  Significant studies: 9/10>> CXR: No active disease. 9/10>> left foot x-ray: There is surrounding TMA stump-as well as lower calf. 9/10>> x-ray left tibia/fibula: Surrounding TMA stump-and lower half of leg. 9/14>> TTE: EF 70-75%, grade 1 diastolic dysfunction 916>> TEE: No vegetation. 9/17>> CT head: No acute abnormality 9/22>> MRI brain: No acute intracranial abnormality.  Significant microbiology data: 9/10>> blood culture: Streptococcus anginosus. 9/12>> blood culture: No growth  Procedures: 9/10>> left AKA 9/16>> TEE  Consults: Orthopedic PCCM Infectious disease  Subjective:  Patient in bed, appears comfortable, denies any headache, no fever, no chest pain or pressure, no shortness of breath , no abdominal pain. No new focal weakness.   Objective: Vitals: Blood pressure 116/78, pulse 84, temperature 97.9 F (36.6 C), temperature source Oral, resp. rate 20, height 6' 2 (1.88 m), weight 116.5 kg, SpO2 99%.   Exam:  Awake Alert, No new F.N deficits, Normal affect [place].AT,PERRAL Supple Neck, No JVD,   Symmetrical Chest wall movement, Good air movement bilaterally, CTAB RRR,No Gallops, Rubs or new Murmurs,  +ve B.Sounds, Abd Soft, No tenderness,   Left AKA stump site clear with bandage on  it   Assessment/Plan:  Septic shock secondary to left lower extremity necrotizing fasciitis and streptococcal bacteremia Sepsis physiology has resolved S/p left AKA by Dr. Harden on 9/10 TEE negative for endocarditis IV penicillin  G completed 9/26.  Acute metabolic encephalopathy Secondary to sepsis physiology Neuroimaging negative Improved-awake alert this morning  Vomiting No further vomiting Supportive care with antiemetics.   Normocytic anemia  Likely secondary to critical/acute illness Follow CBC periodically  Hypothyroidism Synthroid   HTN BP stable Currently not on any antihypertensives  Bronchial asthma Stable/no wheezing As needed bronchodilators.  DM-2 (A1c 6.7 on 9/10) CBG stable Lantus  15 units daily+ 4 units 3 times daily with meals+ SSI  Recent Labs    04/22/24 0748 04/22/24 1231 04/23/24 0844  GLUCAP 76 107* 138*    Pressure Ulcer: Wound 03/27/24 Pressure Injury Buttocks Left Stage 3 -  Full thickness tissue loss. Subcutaneous fat may be visible but bone, tendon or muscle are NOT exposed. (Active)     Wound 03/27/24 1330 Pressure Injury Sacrum Stage 3 -  Full thickness tissue loss. Subcutaneous fat may be visible but bone, tendon or muscle are NOT exposed. (Active)     Wound 04/10/24 1758 Pressure Injury Thigh Posterior;Proximal;Right Stage 3 -  Full thickness tissue loss. Subcutaneous fat may be visible but bone, tendon or muscle are NOT exposed. (Active)    Class I obesity Estimated body mass index is 32.98 kg/m as calculated from the following:   Height as of this encounter: 6' 2 (1.88 m).   Weight as of this encounter: 116.5 kg.   Code status:   Code Status: Full Code   DVT Prophylaxis: SCD's Start:  03/27/24 1747 heparin  injection 5,000 Units Start: 03/27/24 1400   Family Communication: None at bedside   Disposition Plan: Status is: Inpatient Remains inpatient appropriate because: Severity of illness   Planned Discharge  Destination:Skilled nursing facility-medically stable-awaiting bed   Diet: Diet Order             Diet regular Room service appropriate? No; Fluid consistency: Thin  Diet effective now                   Data Review:   Patient Lines/Drains/Airways Status     Active Line/Drains/Airways     Name Placement date Placement time Site Days   Peripheral IV 04/03/24 20 G 1.88 Anterior;Distal;Right Forearm 04/03/24  1320  Forearm  17   Wound 03/27/24 Pressure Injury Buttocks Left Stage 3 -  Full thickness tissue loss. Subcutaneous fat may be visible but bone, tendon or muscle are NOT exposed. 03/27/24  --  Buttocks  24   Wound 03/27/24 Pressure Injury Sacrum Stage 3 -  Full thickness tissue loss. Subcutaneous fat may be visible but bone, tendon or muscle are NOT exposed. 03/27/24  --  Sacrum  24   Wound 03/27/24 1603 Surgical Closed Surgical Incision Leg Left 03/27/24  1603  Leg  24   Wound 04/10/24 1758 Pressure Injury Thigh Posterior;Proximal;Right Stage 3 -  Full thickness tissue loss. Subcutaneous fat may be visible but bone, tendon or muscle are NOT exposed. 04/10/24  1758  Thigh  10             Inpatient Medications  Scheduled Meds:  sodium chloride    Intravenous Once   acetaminophen   650 mg Oral Q6H WA   vitamin C   1,000 mg Oral Daily   heparin   5,000 Units Subcutaneous Q8H   insulin  aspart  0-15 Units Subcutaneous TID WC   insulin  aspart  0-5 Units Subcutaneous QHS   insulin  aspart  4 Units Subcutaneous TID WC   insulin  glargine  15 Units Subcutaneous Q24H   leptospermum manuka honey  1 Application Topical Daily   levothyroxine   50 mcg Oral Q0600   mirtazapine   30 mg Oral QHS   pantoprazole   40 mg Oral Daily   polyethylene glycol  17 g Oral Daily   Continuous Infusions: PRN Meds:.albuterol , alum & mag hydroxide-simeth, dextrose , hydrALAZINE , labetalol , loperamide, methocarbamol  (ROBAXIN ) injection, ondansetron  (ZOFRAN ) IV, mouth rinse, oxyCODONE   DVT  Prophylaxis  SCD's Start: 03/27/24 1747 heparin  injection 5,000 Units Start: 03/27/24 1400   Recent Labs  Lab 04/20/24 0528 04/23/24 0413  WBC 4.5 4.7  HGB 8.7* 9.5*  HCT 26.8* 29.0*  PLT 253 228  MCV 92.4 92.9  MCH 30.0 30.4  MCHC 32.5 32.8  RDW 19.3* 18.3*  LYMPHSABS  --  1.7  MONOABS  --  0.3  EOSABS  --  0.1  BASOSABS  --  0.0    Recent Labs  Lab 04/20/24 0528 04/23/24 0413  NA 139 138  K 3.8 4.3  CL 106 105  CO2 25 25  ANIONGAP 8 8  GLUCOSE 69* 108*  BUN 12 15  CREATININE 1.03 1.06  MG  --  2.0  PHOS  --  4.0  CALCIUM  8.1* 8.4*      Recent Labs  Lab 04/20/24 0528 04/23/24 0413  MG  --  2.0  CALCIUM  8.1* 8.4*    --------------------------------------------------------------------------------------------------------------- Lab Results  Component Value Date   CHOL 166 01/18/2024   HDL 15 (L) 01/18/2024   LDLCALC 127 (H) 01/18/2024  TRIG 169 (H) 03/28/2024   CHOLHDL 11.1 01/18/2024    Lab Results  Component Value Date   HGBA1C 6.7 (H) 03/27/2024    Signature  -   Lavada Stank M.D on 04/23/2024 at 9:25 AM   -  To page go to [email or web address]

## 2024-04-23 NOTE — Progress Notes (Signed)
 Occupational Therapy Treatment Patient Details Name: Fred Mcintosh MRN: [id number] DOB: Apr 20, 1961 Today's Date: 04/23/2024   History of present illness 63 y.o. 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 PMHx: Lt TMA 03/08/24, T2DM, HTN, obesity, Graves' disease   OT comments  Pt making progress with functional goals, agreeable to to EOB sitting for ADL tasks, declined OOB to transfer to [facility] or chair. Pt also participated in B UE HEP with level 2 red theraband to improve UE strength to facilitate ADL mobility. OT will continue to follow acutely to maximize level of function and safety      If plan is discharge home, recommend the following:  Two people to help with walking and/or transfers;Assistance with cooking/housework;Assistance with feeding;Direct supervision/assist for medications management;Direct supervision/assist for financial management;Assist for transportation;Help with stairs or ramp for entrance;A lot of help with walking and/or transfers   Equipment Recommendations  Other (comment) (defer)    Recommendations for Other Services      Precautions / Restrictions Precautions Precautions: Fall;Other (comment) Recall of Precautions/Restrictions: Impaired Precaution/Restrictions Comments: L AKA, sacral wound Required Braces or Orthoses: Other Brace Other Brace: Post-Op Shoe LLE Restrictions Weight Bearing Restrictions Per Provider Order: Yes LLE Weight Bearing Per Provider Order: Weight bearing as tolerated       Mobility Bed Mobility Overal bed mobility: Needs Assistance Bed Mobility: Supine to Sit, Sit to Supine     Supine to sit: Min assist, HOB elevated, Used rails Sit to supine: Min assist   General bed mobility comments: increased time and assistance with trunk elevation and LE mgt back onto bed    Transfers Overall transfer level: Needs  assistance Equipment used: None               General transfer comment: declined OOB, performed lateral scoots towards HOB with min assist     Balance Overall balance assessment: Needs assistance Sitting-balance support: Feet supported, No upper extremity supported Sitting balance-Leahy Scale: Good Sitting balance - Comments: able to perform grooming, UB ADLs and UB HEP seated on EOB                                   ADL either performed or assessed with clinical judgement   ADL Overall ADL's : Needs assistance/impaired     Grooming: Wash/dry hands;Wash/dry face;Oral care;Set up;Sitting   Upper Body Bathing: Set up;Sitting   Lower Body Bathing: Minimal assistance;Sitting/lateral leans   Upper Body Dressing : Set up;Sitting   Lower Body Dressing: Minimal assistance;Sitting/lateral leans       Toileting- Clothing Manipulation and Hygiene: Contact guard assist;Sitting/lateral lean Toileting - Clothing Manipulation Details (indicate cue type and reason): clothing mgt seated EOB            Extremity/Trunk Assessment Upper Extremity Assessment Upper Extremity Assessment: Generalized weakness;Right hand dominant   Lower Extremity Assessment Lower Extremity Assessment: Defer to PT evaluation   Cervical / Trunk Assessment Cervical / Trunk Assessment: Other exceptions Cervical / Trunk Exceptions: obesity    Vision Ability to See in Adequate Light: 0 Adequate Patient Visual Report: No change from baseline     Perception     Praxis     Communication Communication Communication: No apparent difficulties   Cognition Arousal: Alert Behavior During Therapy: [facility] for tasks assessed/performed  Following commands: Intact Following commands impaired: Follows one step commands inconsistently, Follows one step commands with increased time      Cueing   Cueing Techniques: Verbal cues  Exercises Other  Exercises Other Exercises: BUE HEP with level 2 red theraband    Shoulder Instructions       General Comments      Pertinent Vitals/ Pain       Pain Assessment Pain Assessment: Faces Faces Pain Scale: Hurts a little bit Pain Location: LLE phantom limb pain Pain Descriptors / Indicators: Grimacing, Guarding Pain Intervention(s): Limited activity within patient's tolerance, Monitored during session, Repositioned, Premedicated before session  Home Living                                          Prior Functioning/Environment              Frequency  Min 2X/week        Progress Toward Goals  OT Goals(current goals can now be found in the care plan section)  Progress towards OT goals: Progressing toward goals     Plan      Co-evaluation                 AM-PAC OT 6 Clicks Daily Activity     Outcome Measure   Help from another person eating meals?: None Help from another person taking care of personal grooming?: A Little Help from another person toileting, which includes using toliet, bedpan, or urinal?: A Lot Help from another person bathing (including washing, rinsing, drying)?: A Little Help from another person to put on and taking off regular upper body clothing?: A Little Help from another person to put on and taking off regular lower body clothing?: A Little 6 Click Score: 18    End of Session    OT Visit Diagnosis: Muscle weakness (generalized) (M62.81);Other symptoms and signs involving cognitive function Pain - Right/Left: Left Pain - part of body: Leg   Activity Tolerance Patient tolerated treatment well   Patient Left in bed;with call bell/phone within reach;with bed alarm set   Nurse Communication Mobility status        Time: 8972-8944 OT Time Calculation (min): 28 min  Charges: OT General Charges $OT Visit: 1 Visit OT Treatments $Self Care/Home Management : 8-22 mins $Therapeutic Activity: 8-22  mins    [name] Karna Loose 04/23/2024, 12:51 PM

## 2024-04-23 NOTE — Plan of Care (Signed)
°  Problem: Clinical Measurements: Goal: Will remain free from infection Outcome: Progressing Goal: Diagnostic test results will improve Outcome: Progressing   Problem: Nutrition: Goal: Adequate nutrition will be maintained Outcome: Progressing   Problem: Elimination: Goal: Will not experience complications related to bowel motility Outcome: Progressing

## 2024-04-24 DIAGNOSIS — M726 Necrotizing fasciitis: Secondary | ICD-10-CM | POA: Diagnosis not present

## 2024-04-24 LAB — GLUCOSE, CAPILLARY
Glucose-Capillary: 123 mg/dL — ABNORMAL HIGH (ref 70–99)
Glucose-Capillary: 106 mg/dL — ABNORMAL HIGH (ref 70–99)
Glucose-Capillary: 86 mg/dL (ref 70–99)
Glucose-Capillary: 80 mg/dL (ref 70–99)

## 2024-04-24 MED ORDER — METHOCARBAMOL 500 MG PO TABS
500.0000 mg | ORAL_TABLET | Freq: Three times a day (TID) | ORAL | Status: DC
  Administered 2024-04-24 – 2024-05-01 (×20): 500 mg via ORAL
  Filled 2024-04-24 (×20): qty 1

## 2024-04-24 MED ORDER — INSULIN GLARGINE 100 UNIT/ML ~~LOC~~ SOLN
10.0000 [IU] | SUBCUTANEOUS | Status: DC
  Administered 2024-04-25 – 2024-04-28 (×4): 10 [IU] via SUBCUTANEOUS
  Filled 2024-04-24 (×4): qty 0.1

## 2024-04-24 NOTE — TOC Progression Note (Signed)
 Transition of Care [facility]) - Progression Note    Patient Details  Name: Fred Mcintosh MRN: [id number] Date of Birth: Jul 01, 1961  Transition of [facility]) CM/SW Contact  Inocente GORMAN Kindle, LCSW Phone Number: 04/24/2024, 9:04 AM  Clinical Narrative:    Inpatient Care Management continuing to follow. Discharge barriers remain in place. Leadership aware.     Expected Discharge Plan: Skilled Nursing Facility Barriers to Discharge: Inadequate or no insurance, SNF Pending bed offer               Expected Discharge Plan and Services In-house Referral: Clinical Social Work   Post Acute Care Choice: Skilled Nursing Facility Living arrangements for the past 2 months: Single Family Home                                       Social Drivers of Health (SDOH) Interventions SDOH Screenings   Food Insecurity: No Food Insecurity (03/08/2024)  Housing: Low Risk  (03/08/2024)  Transportation Needs: No Transportation Needs (03/08/2024)  Utilities: Not At Risk (03/08/2024)  Social Connections: Moderately Integrated (01/18/2024)  Tobacco Use: Unknown (03/27/2024)    Readmission Risk Interventions     No data to display

## 2024-04-24 NOTE — Plan of Care (Signed)
  Problem: Clinical Measurements: Goal: Will remain free from infection Outcome: Progressing   Problem: Nutrition: Goal: Adequate nutrition will be maintained Outcome: Progressing   

## 2024-04-24 NOTE — Plan of Care (Signed)
  Problem: Clinical Measurements: Goal: Ability to maintain clinical measurements within normal limits will improve Outcome: Progressing Goal: Cardiovascular complication will be avoided Outcome: Progressing   Problem: Activity: Goal: Risk for activity intolerance will decrease Outcome: Progressing   Problem: Pain Managment: Goal: General experience of comfort will improve and/or be controlled Outcome: Progressing   Problem: Safety: Goal: Ability to remain free from injury will improve Outcome: Progressing   Problem: Coping: Goal: Ability to adjust to condition or change in health will improve Outcome: Progressing   Problem: Skin Integrity: Goal: Risk for impaired skin integrity will decrease Outcome: Progressing

## 2024-04-24 NOTE — Progress Notes (Signed)
 PROGRESS NOTE        PATIENT DETAILS Name: Fred Mcintosh Age: 63 y.o. Sex: male Date of Birth: 30-Jun-1961 Admit Date: 03/27/2024 Admitting Physician Valinda Novas, MD ERE:Ypoo, Elna, MD  Brief Summary: Patient is a 63 y.o.  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  Admitted to the ICU-orthopedic consulted-underwent left AKA-subsequently extubated-stabilized and transferred to [facility].  Significant events: 9/10>> septic shock-admit to ICU-underwent left AKA 9/11>> extubated. 9/15>> transferred to [facility].  Significant studies: 9/10>> CXR: No active disease. 9/10>> left foot x-ray: There is surrounding TMA stump-as well as lower calf. 9/10>> x-ray left tibia/fibula: Surrounding TMA stump-and lower half of leg. 9/14>> TTE: EF 70-75%, grade 1 diastolic dysfunction 916>> TEE: No vegetation. 9/17>> CT head: No acute abnormality 9/22>> MRI brain: No acute intracranial abnormality.  Significant microbiology data: 9/10>> blood culture: Streptococcus anginosus. 9/12>> blood culture: No growth  Procedures: 9/10>> left AKA 9/16>> TEE  Consults: Orthopedic PCCM Infectious disease  Subjective:   Feels fair No distress He still has staples on He is trying to turn and sit at the edge of the bed  Objective: Vitals: Blood pressure 121/71, pulse 99, temperature 98.3 F (36.8 C), temperature source Axillary, resp. rate 20, height 6' 2 (1.88 m), weight 117.2 kg, SpO2 99%.   Exam: EOMi NCAT thick neck Mallampati 4 S1-S2 no murmur Abdomen distended no rebound however ROM intact Wound to left buttock seems to be stage II-III Power is 5/5  Assessment/Plan:  Septic shock secondary to left lower extremity necrotizing fasciitis and streptococcal bacteremia Sepsis physiology has resolved S/p left AKA by Dr. Harden  on 9/10--pain management Robaxin  500 IV every 8 changed to oral tomorrow, continue Tylenol  650 and Oxy IR 5 every 4 as needed moderate pain TEE negative for endocarditis IV penicillin  G completed 9/26.  Acute metabolic encephalopathy Secondary to sepsis physiology Neuroimaging negative--careful use Remeron  30 for sleep Improved-awake alert this morning  Vomiting No further vomiting Supportive care with antiemetics.  Would stop scheduled Imodium and discontinue off MAR-is on pantoprazole  and MiraLAX   Normocytic anemia  Likely secondary to critical/acute illness Follow CBC periodically  Hypothyroidism Synthroid  50 mcg daily need TSH in several weeks  HTN BP stable Currently not on any antihypertensives  Bronchial asthma Stable/no wheezing As needed bronchodilators.  DM-2 (A1c 6.7 on 9/10) CBG stable to slightly low Cutting back Lantus  to 10 units units daily+ 4 units 3 times daily with meals+ SSI  Recent Labs    04/23/24 2130 04/24/24 0804 04/24/24 1129  GLUCAP 106* 86 80    Pressure Ulcer: Wound 03/27/24 Pressure Injury Buttocks Left Stage 3 -  Full thickness tissue loss. Subcutaneous fat may be visible but bone, tendon or muscle are NOT exposed. (Active)     Wound 03/27/24 1330 Pressure Injury Sacrum Stage 3 -  Full thickness tissue loss. Subcutaneous fat may be visible but bone, tendon or muscle are NOT exposed. (Active)     Wound 04/10/24 1758 Pressure Injury Thigh Posterior;Proximal;Right Stage 3 -  Full thickness tissue loss. Subcutaneous fat may be visible but bone, tendon or muscle are NOT exposed. (Active)  Treated with Medihoney  Class I obesity Estimated body mass index is 33.17 kg/m as calculated from the following:   Height as of this  encounter: 6' 2 (1.88 m).   Weight as of this encounter: 117.2 kg.   Code status:   Code Status: Full Code   DVT Prophylaxis: SCD's Start: 03/27/24 1747 heparin  injection 5,000 Units Start: 03/27/24 1400   Family  Communication: None at bedside   Disposition Plan: Status is: Inpatient Remains inpatient appropriate because: Severity of illness   Planned Discharge Destination:Skilled nursing facility-medically stable-awaiting bed   Diet: Diet Order             Diet regular Room service appropriate? No; Fluid consistency: Thin  Diet effective now                   Data Review:   Patient Lines/Drains/Airways Status     Active Line/Drains/Airways     Name Placement date Placement time Site Days   Peripheral IV 04/03/24 20 G 1.88 Anterior;Distal;Right Forearm 04/03/24  1320  Forearm  17   Wound 03/27/24 Pressure Injury Buttocks Left Stage 3 -  Full thickness tissue loss. Subcutaneous fat may be visible but bone, tendon or muscle are NOT exposed. 03/27/24  --  Buttocks  24   Wound 03/27/24 Pressure Injury Sacrum Stage 3 -  Full thickness tissue loss. Subcutaneous fat may be visible but bone, tendon or muscle are NOT exposed. 03/27/24  --  Sacrum  24   Wound 03/27/24 1603 Surgical Closed Surgical Incision Leg Left 03/27/24  1603  Leg  24   Wound 04/10/24 1758 Pressure Injury Thigh Posterior;Proximal;Right Stage 3 -  Full thickness tissue loss. Subcutaneous fat may be visible but bone, tendon or muscle are NOT exposed. 04/10/24  1758  Thigh  10             Inpatient Medications  Scheduled Meds:  sodium chloride    Intravenous Once   acetaminophen   650 mg Oral Q6H WA   vitamin C   1,000 mg Oral Daily   heparin   5,000 Units Subcutaneous Q8H   insulin  aspart  0-15 Units Subcutaneous TID WC   insulin  aspart  0-5 Units Subcutaneous QHS   insulin  aspart  4 Units Subcutaneous TID WC   [START ON 04/25/2024] insulin  glargine  10 Units Subcutaneous Q24H   leptospermum manuka honey  1 Application Topical Daily   levothyroxine   50 mcg Oral Q0600   methocarbamol   500 mg Oral TID   mirtazapine   30 mg Oral QHS   pantoprazole   40 mg Oral Daily   polyethylene glycol  17 g Oral Daily    Continuous Infusions: PRN Meds:.albuterol , alum & mag hydroxide-simeth, dextrose , hydrALAZINE , labetalol , loperamide, methocarbamol  (ROBAXIN ) injection, ondansetron  (ZOFRAN ) IV, mouth rinse, oxyCODONE   DVT Prophylaxis  SCD's Start: 03/27/24 1747 heparin  injection 5,000 Units Start: 03/27/24 1400   Recent Labs  Lab 04/20/24 0528 04/23/24 0413  WBC 4.5 4.7  HGB 8.7* 9.5*  HCT 26.8* 29.0*  PLT 253 228  MCV 92.4 92.9  MCH 30.0 30.4  MCHC 32.5 32.8  RDW 19.3* 18.3*  LYMPHSABS  --  1.7  MONOABS  --  0.3  EOSABS  --  0.1  BASOSABS  --  0.0    Recent Labs  Lab 04/20/24 0528 04/23/24 0413  NA 139 138  K 3.8 4.3  CL 106 105  CO2 25 25  ANIONGAP 8 8  GLUCOSE 69* 108*  BUN 12 15  CREATININE 1.03 1.06  MG  --  2.0  PHOS  --  4.0  CALCIUM  8.1* 8.4*      Recent Labs  Lab  04/20/24 0528 04/23/24 0413  MG  --  2.0  CALCIUM  8.1* 8.4*    --------------------------------------------------------------------------------------------------------------- Lab Results  Component Value Date   CHOL 166 01/18/2024   HDL 15 (L) 01/18/2024   LDLCALC 127 (H) 01/18/2024   TRIG 169 (H) 03/28/2024   CHOLHDL 11.1 01/18/2024    Lab Results  Component Value Date   HGBA1C 6.7 (H) 03/27/2024    Signature  -   Colen Grimes M.D on 04/24/2024 at 5:06 PM   -  To page go to [email or web address]

## 2024-04-25 DIAGNOSIS — M726 Necrotizing fasciitis: Secondary | ICD-10-CM | POA: Diagnosis not present

## 2024-04-25 LAB — GLUCOSE, CAPILLARY
Glucose-Capillary: 138 mg/dL — ABNORMAL HIGH (ref 70–99)
Glucose-Capillary: 113 mg/dL — ABNORMAL HIGH (ref 70–99)
Glucose-Capillary: 80 mg/dL (ref 70–99)
Glucose-Capillary: 159 mg/dL — ABNORMAL HIGH (ref 70–99)
Glucose-Capillary: 99 mg/dL (ref 70–99)

## 2024-04-25 NOTE — Progress Notes (Signed)
 PROGRESS NOTE        PATIENT DETAILS Name: Fred Mcintosh Age: 63 y.o. Sex: male Date of Birth: [birth year] Admit Date: 03/27/2024 Admitting Physician Valinda Novas, MD ERE:Ypoo, Elna, MD  Brief Summary: Patient is a 63 y.o.  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  Admitted to the ICU-orthopedic consulted-underwent left AKA-subsequently extubated-stabilized and transferred to [facility].  Significant events: 9/10>> septic shock-admit to ICU-underwent left AKA 9/11>> extubated. 9/15>> transferred to [facility].  Significant studies: 9/10>> CXR: No active disease. 9/10>> left foot x-ray: There is surrounding TMA stump-as well as lower calf. 9/10>> x-ray left tibia/fibula: Surrounding TMA stump-and lower half of leg. 9/14>> TTE: EF 70-75%, grade 1 diastolic dysfunction 916>> TEE: No vegetation. 9/17>> CT head: No acute abnormality 9/22>> MRI brain: No acute intracranial abnormality.  Significant microbiology data: 9/10>> blood culture: Streptococcus anginosus. 9/12>> blood culture: No growth  Procedures: 9/10>> left AKA 9/16>> TEE  Consults: Orthopedic PCCM Infectious disease  Subjective:   Well no distress no real changes   Objective: Vitals: Blood pressure 121/77, pulse 94, temperature 97.7 F (36.5 C), temperature source Oral, resp. rate 19, height 6' 2 (1.88 m), weight 116.1 kg, SpO2 100%.   Exam: EOMi NCAT thick neck Mallampati 4 S1-S2 no murmur Abdomen distended no rebound however ROM intact L buttock wound not reviewed today  Assessment/Plan:  Septic shock secondary to left lower extremity necrotizing fasciitis and streptococcal bacteremia Sepsis physiology has resolved S/p left AKA by Dr. Harden on 9/10--pain management Robaxin  500 IV every 8 changed to oral tomorrow, continue Tylenol  650  and Oxy IR 5 every 4 as needed moderate pain TEE negative for endocarditis IV penicillin  G completed 9/26.  Acute metabolic encephalopathy Secondary to sepsis physiology Neuroimaging negative--careful use Remeron  30 for sleep Improved-awake alert this morning  Vomiting No further vomiting Supportive care with antiemetics.  Would stop scheduled Imodium and discontinue off MAR-is on pantoprazole  and MiraLAX   Normocytic anemia  Likely secondary to critical/acute illness Follow CBC periodically  Hypothyroidism Synthroid  50 mcg daily need TSH in several weeks  HTN BP stable Currently not on any antihypertensives  Bronchial asthma Stable/no wheezing As needed bronchodilators.  DM-2 (A1c 6.7 on 9/10) CBG 99-159 Cutting back Lantus  to 10 units units daily+ 4 units 3 times daily with meals+ SSI  Recent Labs    04/25/24 0758 04/25/24 1141 04/25/24 1548  GLUCAP 80 159* 99    Pressure Ulcer: Wound 03/27/24 Pressure Injury Buttocks Left Stage 3 -  Full thickness tissue loss. Subcutaneous fat may be visible but bone, tendon or muscle are NOT exposed. (Active)     Wound 03/27/24 1330 Pressure Injury Sacrum Stage 3 -  Full thickness tissue loss. Subcutaneous fat may be visible but bone, tendon or muscle are NOT exposed. (Active)     Wound 04/10/24 1758 Pressure Injury Thigh Posterior;Proximal;Right Stage 3 -  Full thickness tissue loss. Subcutaneous fat may be visible but bone, tendon or muscle are NOT exposed. (Active)  Treated with Medihoney  Class I obesity Estimated body mass index is 32.86 kg/m as calculated from the following:   Height as of this encounter: 6' 2 (1.88 m).   Weight as of this encounter: 116.1 kg.   Code status:   Code Status: Full Code  DVT Prophylaxis: SCD's Start: 03/27/24 1747 heparin  injection 5,000 Units Start: 03/27/24 1400   Family Communication: None at bedside   Disposition Plan: Status is: Inpatient Remains inpatient appropriate  because: Severity of illness   Planned Discharge Destination:Skilled nursing facility-medically stable-awaiting bed   Diet: Diet Order             Diet regular Room service appropriate? No; Fluid consistency: Thin  Diet effective now                   Data Review:   Patient Lines/Drains/Airways Status     Active Line/Drains/Airways     Name Placement date Placement time Site Days   Peripheral IV 04/03/24 20 G 1.88 Anterior;Distal;Right Forearm 04/03/24  1320  Forearm  17   Wound 03/27/24 Pressure Injury Buttocks Left Stage 3 -  Full thickness tissue loss. Subcutaneous fat may be visible but bone, tendon or muscle are NOT exposed. 03/27/24  --  Buttocks  24   Wound 03/27/24 Pressure Injury Sacrum Stage 3 -  Full thickness tissue loss. Subcutaneous fat may be visible but bone, tendon or muscle are NOT exposed. 03/27/24  --  Sacrum  24   Wound 03/27/24 1603 Surgical Closed Surgical Incision Leg Left 03/27/24  1603  Leg  24   Wound 04/10/24 1758 Pressure Injury Thigh Posterior;Proximal;Right Stage 3 -  Full thickness tissue loss. Subcutaneous fat may be visible but bone, tendon or muscle are NOT exposed. 04/10/24  1758  Thigh  10             Inpatient Medications  Scheduled Meds:  sodium chloride    Intravenous Once   acetaminophen   650 mg Oral Q6H WA   vitamin C   1,000 mg Oral Daily   heparin   5,000 Units Subcutaneous Q8H   insulin  aspart  0-15 Units Subcutaneous TID WC   insulin  aspart  0-5 Units Subcutaneous QHS   insulin  aspart  4 Units Subcutaneous TID WC   insulin  glargine  10 Units Subcutaneous Q24H   leptospermum manuka honey  1 Application Topical Daily   levothyroxine   50 mcg Oral Q0600   methocarbamol   500 mg Oral TID   mirtazapine   30 mg Oral QHS   pantoprazole   40 mg Oral Daily   polyethylene glycol  17 g Oral Daily   Continuous Infusions: PRN Meds:.albuterol , alum & mag hydroxide-simeth, dextrose , hydrALAZINE , labetalol , ondansetron  (ZOFRAN ) IV, mouth  rinse, oxyCODONE   DVT Prophylaxis  SCD's Start: 03/27/24 1747 heparin  injection 5,000 Units Start: 03/27/24 1400   Recent Labs  Lab 04/20/24 0528 04/23/24 0413  WBC 4.5 4.7  HGB 8.7* 9.5*  HCT 26.8* 29.0*  PLT 253 228  MCV 92.4 92.9  MCH 30.0 30.4  MCHC 32.5 32.8  RDW 19.3* 18.3*  LYMPHSABS  --  1.7  MONOABS  --  0.3  EOSABS  --  0.1  BASOSABS  --  0.0    Recent Labs  Lab 04/20/24 0528 04/23/24 0413  NA 139 138  K 3.8 4.3  CL 106 105  CO2 25 25  ANIONGAP 8 8  GLUCOSE 69* 108*  BUN 12 15  CREATININE 1.03 1.06  MG  --  2.0  PHOS  --  4.0  CALCIUM  8.1* 8.4*      Recent Labs  Lab 04/20/24 0528 04/23/24 0413  MG  --  2.0  CALCIUM  8.1* 8.4*    --------------------------------------------------------------------------------------------------------------- Lab Results  Component Value Date   CHOL 166 01/18/2024   HDL 15 (  L) 01/18/2024   LDLCALC 127 (H) 01/18/2024   TRIG 169 (H) 03/28/2024   CHOLHDL 11.1 01/18/2024    Lab Results  Component Value Date   HGBA1C 6.7 (H) 03/27/2024    Signature  -   Colen Grimes M.D on 04/25/2024 at 4:29 PM   -  To page go to [email or web address]

## 2024-04-25 NOTE — Progress Notes (Signed)
 Physical Therapy Treatment Patient Details Name: Fred Mcintosh MRN: [id number] DOB: 1960-11-24 Today's Date: 04/25/2024   History of Present Illness 63 y.o. 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 Initially declined OOB however was agreeable to practicing sit to stands in stedy. +2 Mod A x5. No change in DC/DME recs at this time. PT will continue to follow.     If plan is discharge home, recommend the following: Two people to help with walking and/or transfers;Two people to help with bathing/dressing/bathroom;Direct supervision/assist for medications management;Assistance with feeding;Assistance with cooking/housework;Direct supervision/assist for financial management;Supervision due to cognitive status;Assist for transportation;Help with stairs or ramp for entrance   Can travel by private vehicle     No  Equipment Recommendations  Wheelchair (measurements PT);Wheelchair cushion (measurements PT);BSC/3in1;Hospital bed;Hoyer lift    Recommendations for Other Services       Precautions / Restrictions Precautions Precautions: Fall;Other (comment) Recall of Precautions/Restrictions: Impaired Precaution/Restrictions Comments: L AKA, sacral wound Restrictions Weight Bearing Restrictions Per Provider Order: No     Mobility  Bed Mobility Overal bed mobility: Needs Assistance Bed Mobility: Supine to Sit, Sit to Supine     Supine to sit: Min assist, HOB elevated, Used rails Sit to supine: Contact guard assist   General bed mobility comments: increased time and assistance with trunk elevation and LE mgt back onto bed    Transfers Overall transfer level: Needs assistance Equipment used: Ambulation equipment used Transfers: Sit to/from Stand Sit to Stand: +2 physical  assistance, Mod assist           General transfer comment: Initially declined OOB however was agreeable to practicing sit to stands in stedy. +2 Mod A x5.    Ambulation/Gait               General Gait Details: unable   Stairs             Wheelchair Mobility     Tilt Bed    Modified Rankin (Stroke Patients Only)       Balance Overall balance assessment: Needs assistance Sitting-balance support: Feet supported, No upper extremity supported Sitting balance-Leahy Scale: Good                                      Communication Communication Communication: No apparent difficulties  Cognition Arousal: Alert Behavior During Therapy: WFL for tasks assessed/performed   PT - Cognitive impairments: No family/caregiver present to determine baseline, Problem solving, Safety/Judgement, Awareness                       PT - Cognition Comments: pt with depressed spirits but actively participated with encouragement from therapist. Following commands: Intact Following commands impaired: Follows one step commands inconsistently, Follows one step commands with increased time    Cueing Cueing Techniques: Verbal cues  Exercises      General Comments General comments (skin integrity, edema, etc.): VSS      Pertinent Vitals/Pain Pain Assessment Pain Assessment: Faces Faces Pain Scale: Hurts a little bit Pain Location: LLE phantom limb pain Pain Descriptors / Indicators: Grimacing, Guarding Pain Intervention(s): Monitored during session, Limited activity within patient's tolerance, Repositioned    Home Living  Prior Function            PT Goals (current goals can now be found in the care plan section) Progress towards PT goals: Progressing toward goals    Frequency    Min 2X/week      PT Plan      Co-evaluation              AM-PAC PT 6 Clicks Mobility   Outcome Measure  Help needed  turning from your back to your side while in a flat bed without using bedrails?: A Lot Help needed moving from lying on your back to sitting on the side of a flat bed without using bedrails?: A Lot Help needed moving to and from a bed to a chair (including a wheelchair)?: Total Help needed standing up from a chair using your arms (e.g., wheelchair or bedside chair)?: Total Help needed to walk in hospital room?: Total Help needed climbing 3-5 steps with a railing? : Total 6 Click Score: 8    End of Session Equipment Utilized During Treatment: Gait belt Activity Tolerance: Patient tolerated treatment well Patient left: in bed;with call bell/phone within reach;with bed alarm set Nurse Communication: Mobility status PT Visit Diagnosis: Difficulty in walking, not elsewhere classified (R26.2);Unsteadiness on feet (R26.81);Other abnormalities of gait and mobility (R26.89);Muscle weakness (generalized) (M62.81)     Time: 8587-8568 PT Time Calculation (min) (ACUTE ONLY): 19 min  Charges:    $Therapeutic Activity: 8-22 mins PT General Charges $$ ACUTE PT VISIT: 1 Visit                     Fred Mcintosh B, PT, DPT Acute Rehab Services [phone]    Fred Mcintosh 04/25/2024, 3:20 PM

## 2024-04-25 NOTE — Plan of Care (Signed)
   Problem: Activity: Goal: Risk for activity intolerance will decrease Outcome: Progressing   Problem: Nutrition: Goal: Adequate nutrition will be maintained Outcome: Progressing   Problem: Pain Managment: Goal: General experience of comfort will improve and/or be controlled Outcome: Progressing

## 2024-04-25 NOTE — TOC CM/SW Note (Signed)
 04-25-2024  KIM B. @ [employer] [place]  : CONTACT FOR D/C PLANNING NEEDS AND UPDATE. PHONE # [phone]

## 2024-04-25 NOTE — TOC Progression Note (Signed)
 Transition of Care [facility]) - Progression Note    Patient Details  Name: Fred Mcintosh MRN: [id number] Date of Birth: 08/25/1960  Transition of Care [facility]) CM/SW Contact  Waddell Barnie Rama, RN Phone Number: 04/25/2024, 2:06 PM  Clinical Narrative:    NCM received notification that Luke with [employer] wanted to talk regarding dc plans, NCM called her back and left vm for her to return call to CSW [place] who has been trying to reach her about his benefits.    Expected Discharge Plan: Skilled Nursing Facility Barriers to Discharge: Inadequate or no insurance, SNF Pending bed offer               Expected Discharge Plan and Services In-house Referral: Clinical Social Work   Post Acute Care Choice: Skilled Nursing Facility Living arrangements for the past 2 months: Single Family Home                                       Social Drivers of Health (SDOH) Interventions SDOH Screenings   Food Insecurity: No Food Insecurity (03/08/2024)  Housing: Low Risk  (03/08/2024)  Transportation Needs: No Transportation Needs (03/08/2024)  Utilities: Not At Risk (03/08/2024)  Social Connections: Moderately Integrated (01/18/2024)  Tobacco Use: Unknown (03/27/2024)    Readmission Risk Interventions     No data to display

## 2024-04-25 NOTE — Plan of Care (Signed)
  Problem: Education: Goal: Knowledge of General Education information will improve Description: Including pain rating scale, medication(s)/side effects and non-pharmacologic comfort measures Outcome: Progressing   Problem: Health Behavior/Discharge Planning: Goal: Ability to manage health-related needs will improve Outcome: Progressing   Problem: Clinical Measurements: Goal: Ability to maintain clinical measurements within normal limits will improve Outcome: Progressing Goal: Will remain free from infection Outcome: Progressing Goal: Diagnostic test results will improve Outcome: Progressing Goal: Respiratory complications will improve Outcome: Progressing Goal: Cardiovascular complication will be avoided Outcome: Progressing   Problem: Activity: Goal: Risk for activity intolerance will decrease Outcome: Progressing   Problem: Nutrition: Goal: Adequate nutrition will be maintained Outcome: Progressing   Problem: Coping: Goal: Level of anxiety will decrease Outcome: Progressing   Problem: Elimination: Goal: Will not experience complications related to bowel motility Outcome: Progressing Goal: Will not experience complications related to urinary retention Outcome: Progressing   Problem: Pain Managment: Goal: General experience of comfort will improve and/or be controlled Outcome: Progressing   Problem: Safety: Goal: Ability to remain free from injury will improve Outcome: Progressing   Problem: Skin Integrity: Goal: Risk for impaired skin integrity will decrease Outcome: Progressing   Problem: Education: Goal: Ability to describe self-care measures that may prevent or decrease complications (Diabetes Survival Skills Education) will improve Outcome: Progressing Goal: Individualized Educational Video(s) Outcome: Progressing   Problem: Coping: Goal: Ability to adjust to condition or change in health will improve Outcome: Progressing   Problem: Fluid  Volume: Goal: Ability to maintain a balanced intake and output will improve Outcome: Progressing   Problem: Health Behavior/Discharge Planning: Goal: Ability to identify and utilize available resources and services will improve Outcome: Progressing Goal: Ability to manage health-related needs will improve Outcome: Progressing   Problem: Metabolic: Goal: Ability to maintain appropriate glucose levels will improve Outcome: Progressing   Problem: Nutritional: Goal: Maintenance of adequate nutrition will improve Outcome: Progressing Goal: Progress toward achieving an optimal weight will improve Outcome: Progressing   Problem: Skin Integrity: Goal: Risk for impaired skin integrity will decrease Outcome: Progressing   Problem: Tissue Perfusion: Goal: Adequacy of tissue perfusion will improve Outcome: Progressing   Problem: Education: Goal: Knowledge of the prescribed therapeutic regimen will improve Outcome: Progressing Goal: Ability to verbalize activity precautions or restrictions will improve Outcome: Progressing Goal: Understanding of discharge needs will improve Outcome: Progressing   Problem: Activity: Goal: Ability to perform//tolerate increased activity and mobilize with assistive devices will improve Outcome: Progressing   Problem: Clinical Measurements: Goal: Postoperative complications will be avoided or minimized Outcome: Progressing   Problem: Self-Care: Goal: Ability to meet self-care needs will improve Outcome: Progressing   Problem: Self-Concept: Goal: Ability to maintain and perform role responsibilities to the fullest extent possible will improve Outcome: Progressing   Problem: Pain Management: Goal: Pain level will decrease with appropriate interventions Outcome: Progressing   Problem: Skin Integrity: Goal: Demonstration of wound healing without infection will improve Outcome: Progressing   Problem: Activity: Goal: Ability to tolerate  increased activity will improve Outcome: Progressing   Problem: Respiratory: Goal: Ability to maintain a clear airway and adequate ventilation will improve Outcome: Progressing   Problem: Role Relationship: Goal: Method of communication will improve Outcome: Progressing   Problem: Safety: Goal: Non-violent Restraint(s) Outcome: Progressing

## 2024-04-25 NOTE — TOC Progression Note (Addendum)
 Transition of Care [facility]) - Progression Note    Patient Details  Name: Fred Mcintosh MRN: [id number] Date of Birth: 08/24/1960  [facility]) CM/SW Contact  Inocente GORMAN Kindle, LCSW Phone Number: 04/25/2024, 1:57 PM  Clinical Narrative:    CSW received request to call Luke with [employer] CM [phone]). CSW left her yet another voicemail. [facility] has yet to received a call back from her either.   Kim returned the call and stated the patient should have SNF benefits and she requested [facility] fax clinicals to f. [phone]. CSW requested she follow up with SNF liaison Tammy and provided contact # for authorization details as this CSW will be out of the office until next Wednesday.    Expected Discharge Plan: Skilled Nursing Facility Barriers to Discharge: Inadequate or no insurance, SNF Pending bed offer               Expected Discharge Plan and Services In-house Referral: Clinical Social Work   Post Acute Care Choice: Skilled Nursing Facility Living arrangements for the past 2 months: Single Family Home                                       Social Drivers of Health (SDOH) Interventions SDOH Screenings   Food Insecurity: No Food Insecurity (03/08/2024)  Housing: Low Risk  (03/08/2024)  Transportation Needs: No Transportation Needs (03/08/2024)  Utilities: Not At Risk (03/08/2024)  Social Connections: Moderately Integrated (01/18/2024)  Tobacco Use: Unknown (03/27/2024)    Readmission Risk Interventions     No data to display

## 2024-04-26 ENCOUNTER — Other Ambulatory Visit: Payer: Self-pay

## 2024-04-26 LAB — GLUCOSE, CAPILLARY
Glucose-Capillary: 81 mg/dL (ref 70–99)
Glucose-Capillary: 75 mg/dL (ref 70–99)

## 2024-04-26 NOTE — TOC Progression Note (Signed)
 Transition of Care [facility]) - Progression Note    Patient Details  Name: Fred Mcintosh MRN: [id number] Date of Birth: 1961/02/16  Transition of Care [facility]) CM/SW Contact  Luann SHAUNNA [place], [place] Phone Number: 04/26/2024, 12:53 PM  Clinical Narrative:     CSW confirmed with [facility] that they initiated SNF auth request. Shara is currently pending. ICM will continue to follow.   Expected Discharge Plan: Skilled Nursing Facility Barriers to Discharge: Insurance Authorization               Expected Discharge Plan and Services In-house Referral: Clinical Social Work   Post Acute Care Choice: Skilled Nursing Facility Living arrangements for the past 2 months: Single Family Home                                       Social Drivers of Health (SDOH) Interventions SDOH Screenings   Food Insecurity: No Food Insecurity (03/08/2024)  Housing: Low Risk  (03/08/2024)  Transportation Needs: No Transportation Needs (03/08/2024)  Utilities: Not At Risk (03/08/2024)  Social Connections: Moderately Integrated (01/18/2024)  Tobacco Use: Unknown (03/27/2024)    Readmission Risk Interventions     No data to display

## 2024-04-26 NOTE — Progress Notes (Signed)
 No overt changes today No charge Have encouraged him to move around and work with therapy

## 2024-04-26 NOTE — Plan of Care (Signed)
  Problem: Education: Goal: Knowledge of General Education information will improve Description: Including pain rating scale, medication(s)/side effects and non-pharmacologic comfort measures Outcome: Progressing   Problem: Health Behavior/Discharge Planning: Goal: Ability to manage health-related needs will improve Outcome: Progressing   Problem: Clinical Measurements: Goal: Ability to maintain clinical measurements within normal limits will improve Outcome: Progressing Goal: Will remain free from infection Outcome: Progressing Goal: Diagnostic test results will improve Outcome: Progressing Goal: Respiratory complications will improve Outcome: Progressing Goal: Cardiovascular complication will be avoided Outcome: Progressing   Problem: Activity: Goal: Risk for activity intolerance will decrease Outcome: Progressing   Problem: Nutrition: Goal: Adequate nutrition will be maintained Outcome: Progressing   Problem: Coping: Goal: Level of anxiety will decrease Outcome: Progressing   Problem: Elimination: Goal: Will not experience complications related to bowel motility Outcome: Progressing Goal: Will not experience complications related to urinary retention Outcome: Progressing   Problem: Pain Managment: Goal: General experience of comfort will improve and/or be controlled Outcome: Progressing   Problem: Safety: Goal: Ability to remain free from injury will improve Outcome: Progressing   Problem: Skin Integrity: Goal: Risk for impaired skin integrity will decrease Outcome: Progressing   Problem: Education: Goal: Ability to describe self-care measures that may prevent or decrease complications (Diabetes Survival Skills Education) will improve Outcome: Progressing Goal: Individualized Educational Video(s) Outcome: Progressing   Problem: Coping: Goal: Ability to adjust to condition or change in health will improve Outcome: Progressing   Problem: Fluid  Volume: Goal: Ability to maintain a balanced intake and output will improve Outcome: Progressing   Problem: Health Behavior/Discharge Planning: Goal: Ability to identify and utilize available resources and services will improve Outcome: Progressing Goal: Ability to manage health-related needs will improve Outcome: Progressing   Problem: Metabolic: Goal: Ability to maintain appropriate glucose levels will improve Outcome: Progressing   Problem: Nutritional: Goal: Maintenance of adequate nutrition will improve Outcome: Progressing Goal: Progress toward achieving an optimal weight will improve Outcome: Progressing   Problem: Skin Integrity: Goal: Risk for impaired skin integrity will decrease Outcome: Progressing   Problem: Tissue Perfusion: Goal: Adequacy of tissue perfusion will improve Outcome: Progressing   Problem: Education: Goal: Knowledge of the prescribed therapeutic regimen will improve Outcome: Progressing Goal: Ability to verbalize activity precautions or restrictions will improve Outcome: Progressing Goal: Understanding of discharge needs will improve Outcome: Progressing   Problem: Activity: Goal: Ability to perform//tolerate increased activity and mobilize with assistive devices will improve Outcome: Progressing   Problem: Clinical Measurements: Goal: Postoperative complications will be avoided or minimized Outcome: Progressing   Problem: Self-Care: Goal: Ability to meet self-care needs will improve Outcome: Progressing   Problem: Self-Concept: Goal: Ability to maintain and perform role responsibilities to the fullest extent possible will improve Outcome: Progressing   Problem: Pain Management: Goal: Pain level will decrease with appropriate interventions Outcome: Progressing   Problem: Skin Integrity: Goal: Demonstration of wound healing without infection will improve Outcome: Progressing   Problem: Activity: Goal: Ability to tolerate  increased activity will improve Outcome: Progressing   Problem: Respiratory: Goal: Ability to maintain a clear airway and adequate ventilation will improve Outcome: Progressing   Problem: Role Relationship: Goal: Method of communication will improve Outcome: Progressing   Problem: Safety: Goal: Non-violent Restraint(s) Outcome: Progressing

## 2024-04-27 DIAGNOSIS — M726 Necrotizing fasciitis: Secondary | ICD-10-CM | POA: Diagnosis not present

## 2024-04-27 LAB — GLUCOSE, CAPILLARY
Glucose-Capillary: 107 mg/dL — ABNORMAL HIGH (ref 70–99)
Glucose-Capillary: 116 mg/dL — ABNORMAL HIGH (ref 70–99)
Glucose-Capillary: 146 mg/dL — ABNORMAL HIGH (ref 70–99)
Glucose-Capillary: 102 mg/dL — ABNORMAL HIGH (ref 70–99)

## 2024-04-27 MED ORDER — ACETAMINOPHEN 325 MG PO TABS
650.0000 mg | ORAL_TABLET | Freq: Four times a day (QID) | ORAL | Status: DC
  Administered 2024-04-27 – 2024-05-01 (×16): 650 mg via ORAL
  Filled 2024-04-27 (×17): qty 2

## 2024-04-27 MED ORDER — ACETAMINOPHEN 325 MG PO TABS: 650.0000 mg | ORAL_TABLET | Freq: Four times a day (QID) | ORAL | Status: DC

## 2024-04-27 NOTE — Plan of Care (Signed)
  Problem: Education: Goal: Knowledge of General Education information will improve Description: Including pain rating scale, medication(s)/side effects and non-pharmacologic comfort measures Outcome: Progressing   Problem: Health Behavior/Discharge Planning: Goal: Ability to manage health-related needs will improve Outcome: Progressing   Problem: Clinical Measurements: Goal: Ability to maintain clinical measurements within normal limits will improve Outcome: Progressing Goal: Will remain free from infection Outcome: Progressing Goal: Diagnostic test results will improve Outcome: Progressing Goal: Respiratory complications will improve Outcome: Progressing Goal: Cardiovascular complication will be avoided Outcome: Progressing   Problem: Activity: Goal: Risk for activity intolerance will decrease Outcome: Progressing   Problem: Nutrition: Goal: Adequate nutrition will be maintained Outcome: Progressing   Problem: Coping: Goal: Level of anxiety will decrease Outcome: Progressing   Problem: Elimination: Goal: Will not experience complications related to bowel motility Outcome: Progressing Goal: Will not experience complications related to urinary retention Outcome: Progressing   Problem: Pain Managment: Goal: General experience of comfort will improve and/or be controlled Outcome: Progressing   Problem: Safety: Goal: Ability to remain free from injury will improve Outcome: Progressing   Problem: Skin Integrity: Goal: Risk for impaired skin integrity will decrease Outcome: Progressing   Problem: Education: Goal: Ability to describe self-care measures that may prevent or decrease complications (Diabetes Survival Skills Education) will improve Outcome: Progressing Goal: Individualized Educational Video(s) Outcome: Progressing   Problem: Coping: Goal: Ability to adjust to condition or change in health will improve Outcome: Progressing   Problem: Fluid  Volume: Goal: Ability to maintain a balanced intake and output will improve Outcome: Progressing   Problem: Health Behavior/Discharge Planning: Goal: Ability to identify and utilize available resources and services will improve Outcome: Progressing Goal: Ability to manage health-related needs will improve Outcome: Progressing   Problem: Metabolic: Goal: Ability to maintain appropriate glucose levels will improve Outcome: Progressing   Problem: Nutritional: Goal: Maintenance of adequate nutrition will improve Outcome: Progressing Goal: Progress toward achieving an optimal weight will improve Outcome: Progressing   Problem: Skin Integrity: Goal: Risk for impaired skin integrity will decrease Outcome: Progressing   Problem: Tissue Perfusion: Goal: Adequacy of tissue perfusion will improve Outcome: Progressing   Problem: Education: Goal: Knowledge of the prescribed therapeutic regimen will improve Outcome: Progressing Goal: Ability to verbalize activity precautions or restrictions will improve Outcome: Progressing Goal: Understanding of discharge needs will improve Outcome: Progressing   Problem: Activity: Goal: Ability to perform//tolerate increased activity and mobilize with assistive devices will improve Outcome: Progressing   Problem: Clinical Measurements: Goal: Postoperative complications will be avoided or minimized Outcome: Progressing   Problem: Self-Care: Goal: Ability to meet self-care needs will improve Outcome: Progressing   Problem: Self-Concept: Goal: Ability to maintain and perform role responsibilities to the fullest extent possible will improve Outcome: Progressing   Problem: Pain Management: Goal: Pain level will decrease with appropriate interventions Outcome: Progressing   Problem: Skin Integrity: Goal: Demonstration of wound healing without infection will improve Outcome: Progressing   Problem: Activity: Goal: Ability to tolerate  increased activity will improve Outcome: Progressing   Problem: Respiratory: Goal: Ability to maintain a clear airway and adequate ventilation will improve Outcome: Progressing   Problem: Role Relationship: Goal: Method of communication will improve Outcome: Progressing   Problem: Safety: Goal: Non-violent Restraint(s) Outcome: Progressing

## 2024-04-27 NOTE — TOC Progression Note (Addendum)
 Transition of Care [facility]) - Progression Note    Patient Details  Name: SOMA LIZAK MRN: [id number] Date of Birth: 08/01/1960  [facility]) CM/SW Contact  Isaiah Public, LCSWA Phone Number: 04/27/2024, 3:36 PM  Clinical Narrative:     CSW spoke with Debbie with [facility] who confirmed patients insurance authorization currently pending for SNF. TOC will continue to follow.  Expected Discharge Plan: Skilled Nursing Facility Barriers to Discharge: Insurance Authorization               Expected Discharge Plan and Services In-house Referral: Clinical Social Work   Post Acute Care Choice: Skilled Nursing Facility Living arrangements for the past 2 months: Single Family Home                                       Social Drivers of Health (SDOH) Interventions SDOH Screenings   Food Insecurity: No Food Insecurity (03/08/2024)  Housing: Low Risk  (03/08/2024)  Transportation Needs: No Transportation Needs (03/08/2024)  Utilities: Not At Risk (03/08/2024)  Social Connections: Moderately Integrated (01/18/2024)  Tobacco Use: Unknown (03/27/2024)    Readmission Risk Interventions     No data to display

## 2024-04-27 NOTE — Progress Notes (Signed)
 PROGRESS NOTE        PATIENT DETAILS Name: Fred Mcintosh Age: 63 y.o. Sex: male Date of Birth: August 27, 1960 Admit Date: 03/27/2024 Admitting Physician Valinda Novas, MD ERE:Ypoo, Elna, MD  Brief Summary: Patient is a 63 y.o.  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  Admitted to the ICU-orthopedic consulted-underwent left AKA-subsequently extubated-stabilized and transferred to [facility].  Significant events: 9/10>> septic shock-admit to ICU-underwent left AKA 9/11>> extubated. 9/15>> transferred to [facility].  Significant studies: 9/10>> CXR: No active disease. 9/10>> left foot x-ray: There is surrounding TMA stump-as well as lower calf. 9/10>> x-ray left tibia/fibula: Surrounding TMA stump-and lower half of leg. 9/14>> TTE: EF 70-75%, grade 1 diastolic dysfunction 916>> TEE: No vegetation. 9/17>> CT head: No acute abnormality 9/22>> MRI brain: No acute intracranial abnormality.  Significant microbiology data: 9/10>> blood culture: Streptococcus anginosus. 9/12>> blood culture: No growth  Procedures: 9/10>> left AKA 9/16>> TEE  Consults: Orthopedic PCCM Infectious disease  Subjective:   Well no distress no real changes   Objective: Vitals: Blood pressure (!) 143/91, pulse 93, temperature 97.6 F (36.4 C), temperature source Axillary, resp. rate 18, height 6' 2 (1.88 m), weight 115.8 kg, SpO2 95%.   Exam: EOMi NCAT thick neck Mallampati 4 S1-S2 no murmur Abdomen distended no rebound however ROM intact L buttock wound not reviewed today  Assessment/Plan:  Septic shock secondary to left lower extremity necrotizing fasciitis and streptococcal bacteremia Sepsis physiology has resolved S/p left AKA by Dr. Harden on 9/10--pain management Robaxin  500 IV every 8 changed to oral tomorrow, continue Tylenol   650 and Oxy IR 5 every 4 as needed moderate pain TEE negative for endocarditis IV penicillin  G completed 9/26.  Acute metabolic encephalopathy Secondary to sepsis physiology Neuroimaging negative--careful use Remeron  30 for sleep Improved-awake alert this morning  Vomiting No further vomiting Supportive care with antiemetics.  Would stop scheduled Imodium and discontinue off MAR-is on pantoprazole  and MiraLAX   Normocytic anemia  Likely secondary to critical/acute illness Follow CBC periodically  Hypothyroidism Synthroid  50 mcg daily need TSH in several weeks  HTN BP stable Currently not on any antihypertensives  Bronchial asthma Stable/no wheezing As needed bronchodilators.  DM-2 (A1c 6.7 on 9/10) CBG 99-159 Cutting back Lantus  to 10 units units daily+ 4 units 3 times daily with meals+ SSI  Recent Labs    04/26/24 0805 04/27/24 0809 04/27/24 1147  GLUCAP 75 107* 116*    Pressure Ulcer: Wound 03/27/24 Pressure Injury Buttocks Left Stage 3 -  Full thickness tissue loss. Subcutaneous fat may be visible but bone, tendon or muscle are NOT exposed. (Active)     Wound 03/27/24 1330 Pressure Injury Sacrum Stage 3 -  Full thickness tissue loss. Subcutaneous fat may be visible but bone, tendon or muscle are NOT exposed. (Active)     Wound 04/10/24 1758 Pressure Injury Thigh Posterior;Proximal;Right Stage 3 -  Full thickness tissue loss. Subcutaneous fat may be visible but bone, tendon or muscle are NOT exposed. (Active)  Treated with Medihoney  Class I obesity Estimated body mass index is 32.78 kg/m as calculated from the following:   Height as of this encounter: 6' 2 (1.88 m).   Weight as of this encounter: 115.8 kg.   Code status:   Code Status: Full  Code   DVT Prophylaxis: SCD's Start: 03/27/24 1747 heparin  injection 5,000 Units Start: 03/27/24 1400   Family Communication: None at bedside   Disposition Plan: Status is: Inpatient Remains inpatient  appropriate because: Severity of illness   Planned Discharge Destination:Skilled nursing facility-medically stable-awaiting bed   Diet: Diet Order             Diet regular Room service appropriate? No; Fluid consistency: Thin  Diet effective now                   Data Review:   Patient Lines/Drains/Airways Status     Active Line/Drains/Airways     Name Placement date Placement time Site Days   Peripheral IV 04/03/24 20 G 1.88 Anterior;Distal;Right Forearm 04/03/24  1320  Forearm  17   Wound 03/27/24 Pressure Injury Buttocks Left Stage 3 -  Full thickness tissue loss. Subcutaneous fat may be visible but bone, tendon or muscle are NOT exposed. 03/27/24  --  Buttocks  24   Wound 03/27/24 Pressure Injury Sacrum Stage 3 -  Full thickness tissue loss. Subcutaneous fat may be visible but bone, tendon or muscle are NOT exposed. 03/27/24  --  Sacrum  24   Wound 03/27/24 1603 Surgical Closed Surgical Incision Leg Left 03/27/24  1603  Leg  24   Wound 04/10/24 1758 Pressure Injury Thigh Posterior;Proximal;Right Stage 3 -  Full thickness tissue loss. Subcutaneous fat may be visible but bone, tendon or muscle are NOT exposed. 04/10/24  1758  Thigh  10             Inpatient Medications  Scheduled Meds:  sodium chloride    Intravenous Once   acetaminophen   650 mg Oral Q6H   vitamin C   1,000 mg Oral Daily   heparin   5,000 Units Subcutaneous Q8H   insulin  aspart  0-15 Units Subcutaneous TID WC   insulin  aspart  0-5 Units Subcutaneous QHS   insulin  aspart  4 Units Subcutaneous TID WC   insulin  glargine  10 Units Subcutaneous Q24H   leptospermum manuka honey  1 Application Topical Daily   levothyroxine   50 mcg Oral Q0600   methocarbamol   500 mg Oral TID   mirtazapine   30 mg Oral QHS   pantoprazole   40 mg Oral Daily   polyethylene glycol  17 g Oral Daily   Continuous Infusions: PRN Meds:.albuterol , alum & mag hydroxide-simeth, dextrose , hydrALAZINE , labetalol , ondansetron  (ZOFRAN )  IV, mouth rinse, oxyCODONE   DVT Prophylaxis  SCD's Start: 03/27/24 1747 heparin  injection 5,000 Units Start: 03/27/24 1400   Recent Labs  Lab 04/23/24 0413  WBC 4.7  HGB 9.5*  HCT 29.0*  PLT 228  MCV 92.9  MCH 30.4  MCHC 32.8  RDW 18.3*  LYMPHSABS 1.7  MONOABS 0.3  EOSABS 0.1  BASOSABS 0.0    Recent Labs  Lab 04/23/24 0413  NA 138  K 4.3  CL 105  CO2 25  ANIONGAP 8  GLUCOSE 108*  BUN 15  CREATININE 1.06  MG 2.0  PHOS 4.0  CALCIUM  8.4*      Recent Labs  Lab 04/23/24 0413  MG 2.0  CALCIUM  8.4*    --------------------------------------------------------------------------------------------------------------- Lab Results  Component Value Date   CHOL 166 01/18/2024   HDL 15 (L) 01/18/2024   LDLCALC 127 (H) 01/18/2024   TRIG 169 (H) 03/28/2024   CHOLHDL 11.1 01/18/2024    Lab Results  Component Value Date   HGBA1C 6.7 (H) 03/27/2024    Signature  -   Jai-Gurmukh  Shelba Susi M.D on 04/27/2024 at 3:38 PM   -  To page go to [email or web address]

## 2024-04-28 DIAGNOSIS — M726 Necrotizing fasciitis: Secondary | ICD-10-CM | POA: Diagnosis not present

## 2024-04-28 LAB — GLUCOSE, CAPILLARY
Glucose-Capillary: 79 mg/dL (ref 70–99)
Glucose-Capillary: 119 mg/dL — ABNORMAL HIGH (ref 70–99)

## 2024-04-28 MED ORDER — METFORMIN HCL 500 MG PO TABS
500.0000 mg | ORAL_TABLET | Freq: Two times a day (BID) | ORAL | Status: DC
Start: 2024-04-28 — End: 2024-05-01
  Administered 2024-04-28 – 2024-05-01 (×6): 500 mg via ORAL
  Filled 2024-04-28 (×6): qty 1

## 2024-04-28 NOTE — Plan of Care (Signed)
  Problem: Education: Goal: Knowledge of General Education information will improve Description: Including pain rating scale, medication(s)/side effects and non-pharmacologic comfort measures Outcome: Progressing   Problem: Health Behavior/Discharge Planning: Goal: Ability to manage health-related needs will improve Outcome: Progressing   Problem: Clinical Measurements: Goal: Ability to maintain clinical measurements within normal limits will improve Outcome: Progressing Goal: Will remain free from infection Outcome: Progressing Goal: Diagnostic test results will improve Outcome: Progressing Goal: Respiratory complications will improve Outcome: Progressing Goal: Cardiovascular complication will be avoided Outcome: Progressing   Problem: Activity: Goal: Risk for activity intolerance will decrease Outcome: Progressing   Problem: Nutrition: Goal: Adequate nutrition will be maintained Outcome: Progressing   Problem: Coping: Goal: Level of anxiety will decrease Outcome: Progressing   Problem: Elimination: Goal: Will not experience complications related to bowel motility Outcome: Progressing Goal: Will not experience complications related to urinary retention Outcome: Progressing   Problem: Pain Managment: Goal: General experience of comfort will improve and/or be controlled Outcome: Progressing   Problem: Safety: Goal: Ability to remain free from injury will improve Outcome: Progressing   Problem: Skin Integrity: Goal: Risk for impaired skin integrity will decrease Outcome: Progressing   Problem: Education: Goal: Ability to describe self-care measures that may prevent or decrease complications (Diabetes Survival Skills Education) will improve Outcome: Progressing Goal: Individualized Educational Video(s) Outcome: Progressing   Problem: Coping: Goal: Ability to adjust to condition or change in health will improve Outcome: Progressing   Problem: Fluid  Volume: Goal: Ability to maintain a balanced intake and output will improve Outcome: Progressing   Problem: Health Behavior/Discharge Planning: Goal: Ability to identify and utilize available resources and services will improve Outcome: Progressing Goal: Ability to manage health-related needs will improve Outcome: Progressing   Problem: Metabolic: Goal: Ability to maintain appropriate glucose levels will improve Outcome: Progressing   Problem: Nutritional: Goal: Maintenance of adequate nutrition will improve Outcome: Progressing Goal: Progress toward achieving an optimal weight will improve Outcome: Progressing   Problem: Skin Integrity: Goal: Risk for impaired skin integrity will decrease Outcome: Progressing   Problem: Tissue Perfusion: Goal: Adequacy of tissue perfusion will improve Outcome: Progressing   Problem: Education: Goal: Knowledge of the prescribed therapeutic regimen will improve Outcome: Progressing Goal: Ability to verbalize activity precautions or restrictions will improve Outcome: Progressing Goal: Understanding of discharge needs will improve Outcome: Progressing   Problem: Activity: Goal: Ability to perform//tolerate increased activity and mobilize with assistive devices will improve Outcome: Progressing   Problem: Clinical Measurements: Goal: Postoperative complications will be avoided or minimized Outcome: Progressing   Problem: Self-Care: Goal: Ability to meet self-care needs will improve Outcome: Progressing   Problem: Self-Concept: Goal: Ability to maintain and perform role responsibilities to the fullest extent possible will improve Outcome: Progressing   Problem: Pain Management: Goal: Pain level will decrease with appropriate interventions Outcome: Progressing   Problem: Skin Integrity: Goal: Demonstration of wound healing without infection will improve Outcome: Progressing   Problem: Activity: Goal: Ability to tolerate  increased activity will improve Outcome: Progressing   Problem: Respiratory: Goal: Ability to maintain a clear airway and adequate ventilation will improve Outcome: Progressing   Problem: Role Relationship: Goal: Method of communication will improve Outcome: Progressing   Problem: Safety: Goal: Non-violent Restraint(s) Outcome: Progressing

## 2024-04-28 NOTE — Progress Notes (Signed)
 PROGRESS NOTE        PATIENT DETAILS Name: Fred Mcintosh Age: 63 y.o. Sex: male Date of Birth: [birth year] Admit Date: 03/27/2024 Admitting Physician Valinda Novas, MD ERE:Ypoo, Elna, MD  Brief Summary: Patient is a 63 y.o.  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  Admitted to the ICU-orthopedic consulted-underwent left AKA-subsequently extubated-stabilized and transferred to [facility].  Significant events: 9/10>> septic shock-admit to ICU-underwent left AKA 9/11>> extubated. 9/15>> transferred to [facility].  Significant studies: 9/10>> CXR: No active disease. 9/10>> left foot x-ray: There is surrounding TMA stump-as well as lower calf. 9/10>> x-ray left tibia/fibula: Surrounding TMA stump-and lower half of leg. 9/14>> TTE: EF 70-75%, grade 1 diastolic dysfunction 916>> TEE: No vegetation. 9/17>> CT head: No acute abnormality 9/22>> MRI brain: No acute intracranial abnormality.  Significant microbiology data: 9/10>> blood culture: Streptococcus anginosus. 9/12>> blood culture: No growth  Procedures: 9/10>> left AKA 9/16>> TEE  Consults: Orthopedic PCCM Infectious disease  Subjective:   Well no distress no real changes He has been advised to sit up and trun q 2--he is noncompliant and is always found layiong in bed Doesn't like CBG--we stopped insulin  today   Objective: Vitals: Blood pressure 118/87, pulse 98, temperature 98.7 F (37.1 C), resp. rate 18, height 6' 2 (1.88 m), weight 115.8 kg, SpO2 97%.   Exam:  Well Cta b BKA looks good R buttock wound seems to be healing--has some skin denudation R side   Assessment/Plan:  Septic shock secondary to left lower extremity necrotizing fasciitis and streptococcal bacteremia Sepsis physiology has resolved S/p left AKA by Dr. Harden on 9/10--pain  management Robaxin  500 IV every 8 changed to oral tomorrow, continue Tylenol  650 and Oxy IR 5 every 4 as needed moderate pain TEE negative for endocarditis IV penicillin  G completed 9/26.  Acute metabolic encephalopathy Secondary to sepsis physiology Neuroimaging negative--careful use Remeron  30 for sleep Coherent x 4  Vomiting No further vomiting Supportive care with antiemetics.  Would stop scheduled Imodium and discontinue off MAR-is on pantoprazole  and MiraLAX   Normocytic anemia  Likely secondary to critical/acute illness Follow CBC periodically  Hypothyroidism Synthroid  50 mcg daily need TSH in several weeks  HTN BP stable Currently not on any antihypertensives  Bronchial asthma Stable/no wheezing As needed bronchodilators.  DM-2 (A1c 6.7 on 9/10) Does not like the fingersticks CBGs all less than 180 Discontinued all insulin  all CBG checks and placed on metformin  500 twice daily at patient request Explained side effects to him of metformin  which we started  Pressure ulcer left buttock  treated with Medihoney Pictures as below   Class I obesity BMI variable currently 32.7  Code status:   Code Status: Full Code   DVT Prophylaxis: SCD's Start: 03/27/24 1747 heparin  injection 5,000 Units Start: 03/27/24 1400   Family Communication: None at bedside   Disposition Plan: Status is: Inpatient Remains inpatient appropriate because: Severity of illness   Planned Discharge Destination:Skilled nursing facility-medically stable-awaiting bed   Diet: Diet Order             Diet regular Room service appropriate? No; Fluid consistency: Thin  Diet effective now                   Data Review:  Patient Lines/Drains/Airways Status     Active Line/Drains/Airways     Name Placement date Placement time Site Days   Peripheral IV 04/03/24 20 G 1.88 Anterior;Distal;Right Forearm 04/03/24  1320  Forearm  17   Wound 03/27/24 Pressure Injury Buttocks Left Stage 3  -  Full thickness tissue loss. Subcutaneous fat may be visible but bone, tendon or muscle are NOT exposed. 03/27/24  --  Buttocks  24   Wound 03/27/24 Pressure Injury Sacrum Stage 3 -  Full thickness tissue loss. Subcutaneous fat may be visible but bone, tendon or muscle are NOT exposed. 03/27/24  --  Sacrum  24   Wound 03/27/24 1603 Surgical Closed Surgical Incision Leg Left 03/27/24  1603  Leg  24   Wound 04/10/24 1758 Pressure Injury Thigh Posterior;Proximal;Right Stage 3 -  Full thickness tissue loss. Subcutaneous fat may be visible but bone, tendon or muscle are NOT exposed. 04/10/24  1758  Thigh  10             Inpatient Medications  Scheduled Meds:  sodium chloride    Intravenous Once   acetaminophen   650 mg Oral Q6H   heparin   5,000 Units Subcutaneous Q8H   leptospermum manuka honey  1 Application Topical Daily   levothyroxine   50 mcg Oral Q0600   metFORMIN   500 mg Oral BID WC   methocarbamol   500 mg Oral TID   mirtazapine   30 mg Oral QHS   pantoprazole   40 mg Oral Daily   polyethylene glycol  17 g Oral Daily   Continuous Infusions: PRN Meds:.albuterol , alum & mag hydroxide-simeth, dextrose , hydrALAZINE , labetalol , ondansetron  (ZOFRAN ) IV, mouth rinse, oxyCODONE   DVT Prophylaxis  SCD's Start: 03/27/24 1747 heparin  injection 5,000 Units Start: 03/27/24 1400   Recent Labs  Lab 04/23/24 0413  WBC 4.7  HGB 9.5*  HCT 29.0*  PLT 228  MCV 92.9  MCH 30.4  MCHC 32.8  RDW 18.3*  LYMPHSABS 1.7  MONOABS 0.3  EOSABS 0.1  BASOSABS 0.0    Recent Labs  Lab 04/23/24 0413  NA 138  K 4.3  CL 105  CO2 25  ANIONGAP 8  GLUCOSE 108*  BUN 15  CREATININE 1.06  MG 2.0  PHOS 4.0  CALCIUM  8.4*      Recent Labs  Lab 04/23/24 0413  MG 2.0  CALCIUM  8.4*    --------------------------------------------------------------------------------------------------------------- Lab Results  Component Value Date   CHOL 166 01/18/2024   HDL 15 (L) 01/18/2024   LDLCALC 127  (H) 01/18/2024   TRIG 169 (H) 03/28/2024   CHOLHDL 11.1 01/18/2024    Lab Results  Component Value Date   HGBA1C 6.7 (H) 03/27/2024    Signature  -   Colen Grimes M.D on 04/28/2024 at 2:25 PM   -  To page go to [email or web address]

## 2024-04-29 DIAGNOSIS — M726 Necrotizing fasciitis: Secondary | ICD-10-CM | POA: Diagnosis not present

## 2024-04-29 LAB — GLUCOSE, CAPILLARY
Glucose-Capillary: 139 mg/dL — ABNORMAL HIGH (ref 70–99)
Glucose-Capillary: 134 mg/dL — ABNORMAL HIGH (ref 70–99)
Glucose-Capillary: 158 mg/dL — ABNORMAL HIGH (ref 70–99)
Glucose-Capillary: 83 mg/dL (ref 70–99)
Glucose-Capillary: 100 mg/dL — ABNORMAL HIGH (ref 70–99)
Glucose-Capillary: 80 mg/dL (ref 70–99)

## 2024-04-29 MED ORDER — ENSURE PLUS HIGH PROTEIN PO LIQD: 237.0000 mL | Freq: Two times a day (BID) | ORAL | Status: DC

## 2024-04-29 NOTE — Progress Notes (Signed)
 PROGRESS NOTE        PATIENT DETAILS Name: Fred Mcintosh Age: 63 y.o. Sex: male Date of Birth: August 26, 1960 Admit Date: 03/27/2024 Admitting Physician Valinda Novas, MD ERE:Ypoo, Elna, MD  Brief Summary: Patient is a 63 y.o.  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  Admitted to the ICU-orthopedic consulted-underwent left AKA-subsequently extubated-stabilized and transferred to [facility].  Significant events: 9/10>> septic shock-admit to ICU-underwent left AKA 9/11>> extubated. 9/15>> transferred to [facility].  Significant studies: 9/10>> CXR: No active disease. 9/10>> left foot x-ray: There is surrounding TMA stump-as well as lower calf. 9/10>> x-ray left tibia/fibula: Surrounding TMA stump-and lower half of leg. 9/14>> TTE: EF 70-75%, grade 1 diastolic dysfunction 916>> TEE: No vegetation. 9/17>> CT head: No acute abnormality 9/22>> MRI brain: No acute intracranial abnormality.  Significant microbiology data: 9/10>> blood culture: Streptococcus anginosus. 9/12>> blood culture: No growth  Procedures: 9/10>> left AKA 9/16>> TEE  Consults: Orthopedic PCCM Infectious disease  Subjective:   Set up today with some difficulty and mod assist No chest pain Feeling overall okay Sugars are 80s to 100s on just metformin    Objective: Vitals: Blood pressure 103/63, pulse 76, temperature 98.5 F (36.9 C), temperature source Oral, resp. rate 18, height 6' 2 (1.88 m), weight 115.8 kg, SpO2 95%.   Exam:  Well Cta b BKA looks good R buttock wound seems to be healing--has some skin denudation R side   Assessment/Plan:  Septic shock secondary to left lower extremity necrotizing fasciitis and streptococcal bacteremia Sepsis physiology has resolved S/p left AKA by Dr. Harden on 9/10--pain management Robaxin  500  IV every 8 changed to oral tomorrow, continue Tylenol  650 and Oxy IR 5 every 4 as needed moderate pain TEE negative for endocarditis IV penicillin  G completed 9/26.  Acute metabolic encephalopathy Secondary to sepsis physiology Neuroimaging negative--careful use Remeron  30 for sleep Coherent x 4  Vomiting No further vomiting Supportive care with antiemetics.  Would stop scheduled Imodium and discontinue off MAR-is on pantoprazole  and MiraLAX   Normocytic anemia  Likely secondary to critical/acute illness Follow CBC periodically  Hypothyroidism Synthroid  50 mcg daily need TSH in several weeks  HTN BP stable Currently not on any antihypertensives  Bronchial asthma Stable/no wheezing As needed bronchodilators.  DM-2 (A1c 6.7 on 9/10) Discontinued all insulin  all CBG checks and placed on metformin  500 twice daily at patient request CBGs are 80s to 100  Pressure ulcer left buttock  treated with Medihoney Pictures as per prior   Class I obesity BMI variable currently 32.7  Code status:   Code Status: Full Code   DVT Prophylaxis: SCD's Start: 03/27/24 1747 heparin  injection 5,000 Units Start: 03/27/24 1400   Family Communication: None at bedside   Disposition Plan: Status is: Inpatient Remains inpatient appropriate because: Severity of illness   Planned Discharge Destination:Skilled nursing facility-medically stable-awaiting bed   Diet: Diet Order             Diet regular Room service appropriate? No; Fluid consistency: Thin  Diet effective now                   Data Review:   Patient Lines/Drains/Airways Status     Active Line/Drains/Airways     Name Placement date Placement time Site Days  Peripheral IV 04/03/24 20 G 1.88 Anterior;Distal;Right Forearm 04/03/24  1320  Forearm  17   Wound 03/27/24 Pressure Injury Buttocks Left Stage 3 -  Full thickness tissue loss. Subcutaneous fat may be visible but bone, tendon or muscle are NOT exposed.  03/27/24  --  Buttocks  24   Wound 03/27/24 Pressure Injury Sacrum Stage 3 -  Full thickness tissue loss. Subcutaneous fat may be visible but bone, tendon or muscle are NOT exposed. 03/27/24  --  Sacrum  24   Wound 03/27/24 1603 Surgical Closed Surgical Incision Leg Left 03/27/24  1603  Leg  24   Wound 04/10/24 1758 Pressure Injury Thigh Posterior;Proximal;Right Stage 3 -  Full thickness tissue loss. Subcutaneous fat may be visible but bone, tendon or muscle are NOT exposed. 04/10/24  1758  Thigh  10             Inpatient Medications  Scheduled Meds:  sodium chloride    Intravenous Once   acetaminophen   650 mg Oral Q6H   heparin   5,000 Units Subcutaneous Q8H   leptospermum manuka honey  1 Application Topical Daily   levothyroxine   50 mcg Oral Q0600   metFORMIN   500 mg Oral BID WC   methocarbamol   500 mg Oral TID   mirtazapine   30 mg Oral QHS   pantoprazole   40 mg Oral Daily   polyethylene glycol  17 g Oral Daily   Continuous Infusions: PRN Meds:.albuterol , alum & mag hydroxide-simeth, dextrose , hydrALAZINE , labetalol , ondansetron  (ZOFRAN ) IV, mouth rinse, oxyCODONE   DVT Prophylaxis  SCD's Start: 03/27/24 1747 heparin  injection 5,000 Units Start: 03/27/24 1400   Recent Labs  Lab 04/23/24 0413  WBC 4.7  HGB 9.5*  HCT 29.0*  PLT 228  MCV 92.9  MCH 30.4  MCHC 32.8  RDW 18.3*  LYMPHSABS 1.7  MONOABS 0.3  EOSABS 0.1  BASOSABS 0.0    Recent Labs  Lab 04/23/24 0413  NA 138  K 4.3  CL 105  CO2 25  ANIONGAP 8  GLUCOSE 108*  BUN 15  CREATININE 1.06  MG 2.0  PHOS 4.0  CALCIUM  8.4*      Recent Labs  Lab 04/23/24 0413  MG 2.0  CALCIUM  8.4*    --------------------------------------------------------------------------------------------------------------- Lab Results  Component Value Date   CHOL 166 01/18/2024   HDL 15 (L) 01/18/2024   LDLCALC 127 (H) 01/18/2024   TRIG 169 (H) 03/28/2024   CHOLHDL 11.1 01/18/2024    Lab Results  Component Value  Date   HGBA1C 6.7 (H) 03/27/2024    Signature  -   Colen Grimes M.D on 04/29/2024 at 12:10 PM   -  To page go to [email or web address]

## 2024-04-29 NOTE — Progress Notes (Signed)
 Orthopedic Tech Progress Note Patient Details:  Fred Mcintosh Dec 21, 1960 [id number]  RN called requesting a smaller AKA SHRINKER, since all of the swelling has gone down what he has on is way to big. Called in order to [facility]   Patient ID: Fred Mcintosh, male   DOB: March 03, 1961, 63 y.o.   MRN: [id number]  Fred Mcintosh Pac 04/29/2024, 12:23 PM

## 2024-04-29 NOTE — Plan of Care (Signed)
  Problem: Education: Goal: Knowledge of General Education information will improve Description: Including pain rating scale, medication(s)/side effects and non-pharmacologic comfort measures Outcome: Progressing   Problem: Health Behavior/Discharge Planning: Goal: Ability to manage health-related needs will improve Outcome: Progressing   Problem: Clinical Measurements: Goal: Ability to maintain clinical measurements within normal limits will improve Outcome: Progressing Goal: Will remain free from infection Outcome: Progressing Goal: Diagnostic test results will improve Outcome: Progressing Goal: Respiratory complications will improve Outcome: Progressing Goal: Cardiovascular complication will be avoided Outcome: Progressing   Problem: Activity: Goal: Risk for activity intolerance will decrease Outcome: Progressing   Problem: Nutrition: Goal: Adequate nutrition will be maintained Outcome: Progressing   Problem: Coping: Goal: Level of anxiety will decrease Outcome: Progressing   Problem: Elimination: Goal: Will not experience complications related to bowel motility Outcome: Progressing Goal: Will not experience complications related to urinary retention Outcome: Progressing   Problem: Pain Managment: Goal: General experience of comfort will improve and/or be controlled Outcome: Progressing   Problem: Safety: Goal: Ability to remain free from injury will improve Outcome: Progressing   Problem: Skin Integrity: Goal: Risk for impaired skin integrity will decrease Outcome: Progressing   Problem: Education: Goal: Ability to describe self-care measures that may prevent or decrease complications (Diabetes Survival Skills Education) will improve Outcome: Progressing Goal: Individualized Educational Video(s) Outcome: Progressing   Problem: Coping: Goal: Ability to adjust to condition or change in health will improve Outcome: Progressing   Problem: Fluid  Volume: Goal: Ability to maintain a balanced intake and output will improve Outcome: Progressing   Problem: Health Behavior/Discharge Planning: Goal: Ability to identify and utilize available resources and services will improve Outcome: Progressing Goal: Ability to manage health-related needs will improve Outcome: Progressing   Problem: Metabolic: Goal: Ability to maintain appropriate glucose levels will improve Outcome: Progressing   Problem: Nutritional: Goal: Maintenance of adequate nutrition will improve Outcome: Progressing Goal: Progress toward achieving an optimal weight will improve Outcome: Progressing   Problem: Skin Integrity: Goal: Risk for impaired skin integrity will decrease Outcome: Progressing   Problem: Tissue Perfusion: Goal: Adequacy of tissue perfusion will improve Outcome: Progressing   Problem: Education: Goal: Knowledge of the prescribed therapeutic regimen will improve Outcome: Progressing Goal: Ability to verbalize activity precautions or restrictions will improve Outcome: Progressing Goal: Understanding of discharge needs will improve Outcome: Progressing   Problem: Activity: Goal: Ability to perform//tolerate increased activity and mobilize with assistive devices will improve Outcome: Progressing   Problem: Clinical Measurements: Goal: Postoperative complications will be avoided or minimized Outcome: Progressing   Problem: Self-Care: Goal: Ability to meet self-care needs will improve Outcome: Progressing   Problem: Self-Concept: Goal: Ability to maintain and perform role responsibilities to the fullest extent possible will improve Outcome: Progressing   Problem: Pain Management: Goal: Pain level will decrease with appropriate interventions Outcome: Progressing   Problem: Skin Integrity: Goal: Demonstration of wound healing without infection will improve Outcome: Progressing   Problem: Activity: Goal: Ability to tolerate  increased activity will improve Outcome: Progressing   Problem: Respiratory: Goal: Ability to maintain a clear airway and adequate ventilation will improve Outcome: Progressing   Problem: Role Relationship: Goal: Method of communication will improve Outcome: Progressing   Problem: Safety: Goal: Non-violent Restraint(s) Outcome: Progressing

## 2024-04-29 NOTE — TOC Progression Note (Addendum)
 Transition of Care [facility]) - Progression Note    Patient Details  Name: Fred Mcintosh MRN: [id number] Date of Birth: [birth year]  [facility] CM/SW Contact  [name], [place] Phone Number: 04/29/2024, 8:56 AM  Clinical Narrative:   CSW messaged Debbie with [facility] admissions about patients pending authorization. Marval stated she will check.   1:29 PM CSW followed up on pending auth. Awaiting a response.  1:47 PM Per facility, shara is still pending.    CSW will continue to follow.    Expected Discharge Plan: Skilled Nursing Facility Barriers to Discharge: Insurance Authorization               Expected Discharge Plan and Services In-house Referral: Clinical Social Work   Post Acute Care Choice: Skilled Nursing Facility Living arrangements for the past 2 months: Single Family Home                                       Social Drivers of Health (SDOH) Interventions SDOH Screenings   Food Insecurity: No Food Insecurity (03/08/2024)  Housing: Low Risk  (03/08/2024)  Transportation Needs: No Transportation Needs (03/08/2024)  Utilities: Not At Risk (03/08/2024)  Social Connections: Moderately Integrated (01/18/2024)  Tobacco Use: Unknown (03/27/2024)    Readmission Risk Interventions     No data to display

## 2024-04-29 NOTE — Plan of Care (Signed)
  Problem: Education: Goal: Knowledge of General Education information will improve Description: Including pain rating scale, medication(s)/side effects and non-pharmacologic comfort measures Outcome: Progressing   Problem: Clinical Measurements: Goal: Ability to maintain clinical measurements within normal limits will improve Outcome: Progressing   Problem: Pain Managment: Goal: General experience of comfort will improve and/or be controlled Outcome: Progressing   Problem: Skin Integrity: Goal: Risk for impaired skin integrity will decrease Outcome: Progressing

## 2024-04-30 DIAGNOSIS — M726 Necrotizing fasciitis: Secondary | ICD-10-CM | POA: Diagnosis not present

## 2024-04-30 LAB — COMPREHENSIVE METABOLIC PANEL WITH GFR
ALT: 11 U/L (ref 0–44)
AST: 15 U/L (ref 15–41)
Albumin: 2.3 g/dL — ABNORMAL LOW (ref 3.5–5.0)
Alkaline Phosphatase: 74 U/L (ref 38–126)
Anion gap: 9 (ref 5–15)
BUN: 14 mg/dL (ref 8–23)
CO2: 25 mmol/L (ref 22–32)
Calcium: 8.6 mg/dL — ABNORMAL LOW (ref 8.9–10.3)
Chloride: 101 mmol/L (ref 98–111)
Creatinine, Ser: 1.11 mg/dL (ref 0.61–1.24)
GFR, Estimated: >60 mL/min (ref >60)
Glucose, Bld: 179 mg/dL — ABNORMAL HIGH (ref 70–99)
Potassium: 4.1 mmol/L (ref 3.5–5.1)
Sodium: 135 mmol/L (ref 135–145)
Total Bilirubin: 0.7 mg/dL (ref 0.0–1.2)
Total Protein: 6.2 g/dL — ABNORMAL LOW (ref 6.5–8.1)

## 2024-04-30 LAB — CBC WITH DIFFERENTIAL/PLATELET
Abs Immature Granulocytes: 0.02 K/uL (ref 0.00–0.07)
Basophils Absolute: 0 K/uL (ref 0.0–0.1)
Basophils Relative: 1 %
Eosinophils Absolute: 0.2 K/uL (ref 0.0–0.5)
Eosinophils Relative: 4 %
HCT: 31.1 % — ABNORMAL LOW (ref 39.0–52.0)
Hemoglobin: 10 g/dL — ABNORMAL LOW (ref 13.0–17.0)
Immature Granulocytes: 0 %
Lymphocytes Relative: 42 %
Lymphs Abs: 2.1 K/uL (ref 0.7–4.0)
MCH: 29.5 pg (ref 26.0–34.0)
MCHC: 32.2 g/dL (ref 30.0–36.0)
MCV: 91.7 fL (ref 80.0–100.0)
Monocytes Absolute: 0.3 K/uL (ref 0.1–1.0)
Monocytes Relative: 6 %
Neutro Abs: 2.3 K/uL (ref 1.7–7.7)
Neutrophils Relative %: 47 %
Platelets: 177 K/uL (ref 150–400)
RBC: 3.39 MIL/uL — ABNORMAL LOW (ref 4.22–5.81)
RDW: 16.3 % — ABNORMAL HIGH (ref 11.5–15.5)
WBC: 4.9 K/uL (ref 4.0–10.5)
nRBC: 0 % (ref 0.0–0.2)

## 2024-04-30 MED ORDER — CEPHALEXIN 500 MG PO CAPS: 500.0000 mg | ORAL_CAPSULE | Freq: Two times a day (BID) | ORAL | Status: DC

## 2024-04-30 MED ORDER — CEFADROXIL 500 MG PO CAPS
1000.0000 mg | ORAL_CAPSULE | Freq: Two times a day (BID) | ORAL | Status: DC
  Administered 2024-04-30 – 2024-05-01 (×3): 1000 mg via ORAL
  Filled 2024-04-30 (×4): qty 2

## 2024-04-30 NOTE — TOC Progression Note (Signed)
 Transition of Care [facility]) - Progression Note    Patient Details  Name: Fred Mcintosh MRN: [id number] Date of Birth: 18-Jan-1961  [facility]) CM/SW Contact  [name], [place] Phone Number: 04/30/2024, 10:18 AM  Clinical Narrative:   Per [facility], authorization is still pending. Their central intake team will be following up on authorization.   CSW will continue to follow.    Expected Discharge Plan: Skilled Nursing Facility Barriers to Discharge: Insurance Authorization               Expected Discharge Plan and Services In-house Referral: Clinical Social Work   Post Acute Care Choice: Skilled Nursing Facility Living arrangements for the past 2 months: Single Family Home                                       Social Drivers of Health (SDOH) Interventions SDOH Screenings   Food Insecurity: No Food Insecurity (03/08/2024)  Housing: Low Risk  (03/08/2024)  Transportation Needs: No Transportation Needs (03/08/2024)  Utilities: Not At Risk (03/08/2024)  Social Connections: Moderately Integrated (01/18/2024)  Tobacco Use: Unknown (03/27/2024)    Readmission Risk Interventions     No data to display

## 2024-04-30 NOTE — Progress Notes (Signed)
 PROGRESS NOTE        PATIENT DETAILS Name: Fred Mcintosh Age: 63 y.o. Sex: male Date of Birth: October 18, 1960 Admit Date: 03/27/2024 Admitting Physician Valinda Novas, MD ERE:Ypoo, Elna, MD  Brief Summary: Patient is a 63 y.o.  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  Admitted to the ICU-orthopedic consulted-underwent left AKA-subsequently extubated-stabilized and transferred to [facility].  Significant events: 9/10>> septic shock-admit to ICU-underwent left AKA 9/11>> extubated. 9/15>> transferred to [facility].  Significant studies: 9/10>> CXR: No active disease. 9/10>> left foot x-ray: There is surrounding TMA stump-as well as lower calf. 9/10>> x-ray left tibia/fibula: Surrounding TMA stump-and lower half of leg. 9/14>> TTE: EF 70-75%, grade 1 diastolic dysfunction 916>> TEE: No vegetation. 9/17>> CT head: No acute abnormality 9/22>> MRI brain: No acute intracranial abnormality.  Significant microbiology data: 9/10>> blood culture: Streptococcus anginosus. 9/12>> blood culture: No growth  Procedures: 9/10>> left AKA 9/16>> TEE  Consults: Orthopedic PCCM Infectious disease  Subjective:  Set up this morning a little bit but the wound on the left lower extremity at the BKA site in the center seems to be dehiscing a little bit-and looks a little bloody and I am able to express some serosanguineous/slightly purulent discharge from it-it has an odor as well   Objective: Vitals: Blood pressure (!) 115/90, pulse 98, temperature 98.1 F (36.7 C), temperature source Oral, resp. rate 18, height 6' 2 (1.88 m), weight 118.1 kg, SpO2 97%.   Exam:  Well Cta b The BKA site on the left looks as below     Assessment/Plan:  Septic shock secondary to left lower extremity necrotizing fasciitis and streptococcal  bacteremia Sepsis physiology has resolved S/p left AKA by Dr. Harden on 9/10--pain management Robaxin  500 IV every 8 changed to oral tomorrow, continue Tylenol  650 and Oxy IR 5 every 4 as needed moderate pain TEE negative for endocarditis IV penicillin  G completed 9/26 Await word from Dr. Harden until then would start Keflex 500 twice daily (does not look like a large amount of discharge) and defer imaging to Ortho may need repeat CT per him  Acute metabolic encephalopathy Secondary to sepsis physiology Neuroimaging negative--careful use Remeron  30 for sleep Coherent x 4  Vomiting No further vomiting Supportive care with antiemetics.  Would stop scheduled Imodium and discontinue off MAR-is on pantoprazole  and MiraLAX   Normocytic anemia  Likely secondary to critical/acute illness Follow CBC periodically  Hypothyroidism Synthroid  50 mcg daily need TSH in several weeks  HTN BP stable Currently not on any antihypertensives  Bronchial asthma Stable/no wheezing As needed bronchodilators.  DM-2 (A1c 6.7 on 9/10) Discontinued all insulin  all CBG checks and placed on metformin  500 twice daily at patient request CBGs are 80s to 100  Pressure ulcer left buttock  treated with [employer] as per prior-he continues to turn some in bed I have encouraged him to continue turning Wound nurse to reassess as able   Class I obesity BMI variable currently 32.7  Code status:   Code Status: Full Code   DVT Prophylaxis: SCD's Start: 03/27/24 1747 heparin  injection 5,000 Units Start: 03/27/24 1400   Family Communication: None at bedside   Disposition Plan: Status is: Inpatient Remains inpatient appropriate because: Severity of illness   Planned Discharge Destination:Skilled nursing  facility-medically stable-awaiting bed    Data Review:   Patient Lines/Drains/Airways Status    Inpatient Medications  Scheduled Meds:  sodium chloride    Intravenous Once   acetaminophen   650  mg Oral Q6H   cephALEXin  500 mg Oral Q12H   heparin   5,000 Units Subcutaneous Q8H   leptospermum manuka honey  1 Application Topical Daily   levothyroxine   50 mcg Oral Q0600   metFORMIN   500 mg Oral BID WC   methocarbamol   500 mg Oral TID   mirtazapine   30 mg Oral QHS   pantoprazole   40 mg Oral Daily   polyethylene glycol  17 g Oral Daily   Continuous Infusions: PRN Meds:.albuterol , alum & mag hydroxide-simeth, dextrose , hydrALAZINE , labetalol , ondansetron  (ZOFRAN ) IV, mouth rinse, oxyCODONE   DVT Prophylaxis  Heparin  1000   -------------------------------------------------------------------------------------------------------------- Signature  -   Colen Grimes M.D on 04/30/2024 at 10:59 AM   -  To page go to [email or web address]

## 2024-04-30 NOTE — Plan of Care (Signed)
  Problem: Education: Goal: Knowledge of General Education information will improve Description: Including pain rating scale, medication(s)/side effects and non-pharmacologic comfort measures Outcome: Progressing   Problem: Health Behavior/Discharge Planning: Goal: Ability to manage health-related needs will improve Outcome: Progressing   Problem: Clinical Measurements: Goal: Ability to maintain clinical measurements within normal limits will improve Outcome: Progressing Goal: Will remain free from infection Outcome: Progressing Goal: Diagnostic test results will improve Outcome: Progressing Goal: Respiratory complications will improve Outcome: Progressing Goal: Cardiovascular complication will be avoided Outcome: Progressing   Problem: Activity: Goal: Risk for activity intolerance will decrease Outcome: Progressing   Problem: Nutrition: Goal: Adequate nutrition will be maintained Outcome: Progressing   Problem: Coping: Goal: Level of anxiety will decrease Outcome: Progressing   Problem: Elimination: Goal: Will not experience complications related to bowel motility Outcome: Progressing Goal: Will not experience complications related to urinary retention Outcome: Progressing   Problem: Pain Managment: Goal: General experience of comfort will improve and/or be controlled Outcome: Progressing   Problem: Safety: Goal: Ability to remain free from injury will improve Outcome: Progressing   Problem: Skin Integrity: Goal: Risk for impaired skin integrity will decrease Outcome: Progressing   Problem: Education: Goal: Ability to describe self-care measures that may prevent or decrease complications (Diabetes Survival Skills Education) will improve Outcome: Progressing Goal: Individualized Educational Video(s) Outcome: Progressing   Problem: Coping: Goal: Ability to adjust to condition or change in health will improve Outcome: Progressing   Problem: Fluid  Volume: Goal: Ability to maintain a balanced intake and output will improve Outcome: Progressing   Problem: Health Behavior/Discharge Planning: Goal: Ability to identify and utilize available resources and services will improve Outcome: Progressing Goal: Ability to manage health-related needs will improve Outcome: Progressing   Problem: Metabolic: Goal: Ability to maintain appropriate glucose levels will improve Outcome: Progressing   Problem: Nutritional: Goal: Maintenance of adequate nutrition will improve Outcome: Progressing Goal: Progress toward achieving an optimal weight will improve Outcome: Progressing   Problem: Skin Integrity: Goal: Risk for impaired skin integrity will decrease Outcome: Progressing   Problem: Tissue Perfusion: Goal: Adequacy of tissue perfusion will improve Outcome: Progressing   Problem: Education: Goal: Knowledge of the prescribed therapeutic regimen will improve Outcome: Progressing Goal: Ability to verbalize activity precautions or restrictions will improve Outcome: Progressing Goal: Understanding of discharge needs will improve Outcome: Progressing   Problem: Activity: Goal: Ability to perform//tolerate increased activity and mobilize with assistive devices will improve Outcome: Progressing   Problem: Clinical Measurements: Goal: Postoperative complications will be avoided or minimized Outcome: Progressing   Problem: Self-Care: Goal: Ability to meet self-care needs will improve Outcome: Progressing   Problem: Self-Concept: Goal: Ability to maintain and perform role responsibilities to the fullest extent possible will improve Outcome: Progressing   Problem: Pain Management: Goal: Pain level will decrease with appropriate interventions Outcome: Progressing   Problem: Skin Integrity: Goal: Demonstration of wound healing without infection will improve Outcome: Progressing   Problem: Activity: Goal: Ability to tolerate  increased activity will improve Outcome: Progressing   Problem: Respiratory: Goal: Ability to maintain a clear airway and adequate ventilation will improve Outcome: Progressing   Problem: Role Relationship: Goal: Method of communication will improve Outcome: Progressing   Problem: Safety: Goal: Non-violent Restraint(s) Outcome: Progressing

## 2024-04-30 NOTE — Progress Notes (Signed)
 Occupational Therapy Treatment Patient Details Name: Fred Mcintosh MRN: [id number] DOB: 1960/08/31 Today's Date: 04/30/2024   History of present illness 63 y.o. 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 PMHx: Lt TMA 03/08/24, T2DM, HTN, obesity, Graves' disease   OT comments  Patient able to progress from bed to recliner this date with Max A of two and use of the [place].  Hoyer lift pad placed under the patient for use back to bed.  OT will continue efforts in the acute setting to address the deficits listed below.  Patient will benefit from continued inpatient follow up therapy, <3 hours/day.      If plan is discharge home, recommend the following:  Two people to help with walking and/or transfers;Assistance with cooking/housework;Assistance with feeding;Direct supervision/assist for medications management;Direct supervision/assist for financial management;Assist for transportation;Help with stairs or ramp for entrance;A lot of help with walking and/or transfers   Equipment Recommendations  None recommended by OT    Recommendations for Other Services      Precautions / Restrictions Precautions Precautions: Fall;Other (comment) Recall of Precautions/Restrictions: Impaired Precaution/Restrictions Comments: L AKA, sacral wound Required Braces or Orthoses: Other Brace Other Brace: L LE shrinker Restrictions Weight Bearing Restrictions Per Provider Order: No LLE Weight Bearing Per Provider Order: Non weight bearing       Mobility Bed Mobility Overal bed mobility: Needs Assistance Bed Mobility: Supine to Sit     Supine to sit: HOB elevated, Used rails, Contact guard          Transfers Overall transfer level: Needs assistance Equipment used: Ambulation equipment used Transfers: Sit to/from Stand Sit to Stand: +2 physical assistance, Max Counselling psychologist  via Lift Equipment: Stedy   Balance Overall balance assessment: Needs assistance Sitting-balance support: Feet supported, No upper extremity supported Sitting balance-Leahy Scale: Good     Standing balance support: Reliant on assistive device for balance Standing balance-Leahy Scale: Poor                               Extremity/Trunk Assessment Upper Extremity Assessment Upper Extremity Assessment: Generalized weakness   Lower Extremity Assessment Lower Extremity Assessment: Defer to PT evaluation        Vision Patient Visual Report: No change from baseline Vision Assessment?: No apparent visual deficits   Perception Perception Perception: Not tested   Praxis Praxis Praxis: Not tested   Communication Communication Communication: No apparent difficulties Factors Affecting Communication: Reduced clarity of speech   Cognition Arousal: Alert Behavior During Therapy: WFL for tasks assessed/performed Cognition: No apparent impairments                               Following commands: Intact Following commands impaired: Follows one step commands with increased time      Cueing   Cueing Techniques: Verbal cues               General Comments pt with noted skin breakdown on posterior upper thighs below buttocks    Pertinent Vitals/ Pain       Pain Assessment Pain Assessment: Faces Faces Pain Scale: Hurts a little bit Pain Location: LLE phantom limb pain Pain Descriptors / Indicators: Grimacing, Guarding Pain Intervention(s): Premedicated before session  Frequency  Min 2X/week        Progress Toward Goals  OT Goals(current goals can now be found in the care plan section)  Progress towards OT goals: Progressing toward goals  Acute Rehab OT Goals OT Goal Formulation: With patient Time For Goal Achievement: 05/16/24 Potential to Achieve Goals:  Fair  Plan      Co-evaluation    PT/OT/SLP Co-Evaluation/Treatment: Yes Reason for Co-Treatment: Necessary to address cognition/behavior during functional activity;To address functional/ADL transfers PT goals addressed during session: Mobility/safety with mobility;Balance;Proper use of DME OT goals addressed during session: Strengthening/ROM;ADL's and self-care      AM-PAC OT 6 Clicks Daily Activity     Outcome Measure   Help from another person eating meals?: None Help from another person taking care of personal grooming?: A Little Help from another person toileting, which includes using toliet, bedpan, or urinal?: A Lot Help from another person bathing (including washing, rinsing, drying)?: A Lot Help from another person to put on and taking off regular upper body clothing?: A Little Help from another person to put on and taking off regular lower body clothing?: A Lot 6 Click Score: 16    End of Session Equipment Utilized During Treatment: Other (comment)  OT Visit Diagnosis: Muscle weakness (generalized) (M62.81);Other symptoms and signs involving cognitive function Pain - Right/Left: Left Pain - part of body: Leg   Activity Tolerance Patient tolerated treatment well   Patient Left in chair;with call bell/phone within reach   Nurse Communication Mobility status        Time: 1347-1410 OT Time Calculation (min): 23 min  Charges: OT General Charges $OT Visit: 1 Visit OT Treatments $Therapeutic Activity: 8-22 mins  04/30/2024  RP, OTR/L  Acute Rehabilitation Services  Office:  [phone]   Charlie JONETTA Halsted 04/30/2024, 2:22 PM

## 2024-05-01 DIAGNOSIS — M726 Necrotizing fasciitis: Secondary | ICD-10-CM | POA: Diagnosis not present

## 2024-05-01 LAB — CBC WITH DIFFERENTIAL/PLATELET
Abs Immature Granulocytes: 0.02 K/uL (ref 0.00–0.07)
Basophils Absolute: 0 K/uL (ref 0.0–0.1)
Basophils Relative: 0 %
Eosinophils Absolute: 0.3 K/uL (ref 0.0–0.5)
Eosinophils Relative: 6 %
HCT: 32.6 % — ABNORMAL LOW (ref 39.0–52.0)
Hemoglobin: 10.6 g/dL — ABNORMAL LOW (ref 13.0–17.0)
Immature Granulocytes: 0 %
Lymphocytes Relative: 38 %
Lymphs Abs: 2 K/uL (ref 0.7–4.0)
MCH: 29.4 pg (ref 26.0–34.0)
MCHC: 32.5 g/dL (ref 30.0–36.0)
MCV: 90.3 fL (ref 80.0–100.0)
Monocytes Absolute: 0.3 K/uL (ref 0.1–1.0)
Monocytes Relative: 6 %
Neutro Abs: 2.6 K/uL (ref 1.7–7.7)
Neutrophils Relative %: 50 %
Platelets: 192 K/uL (ref 150–400)
RBC: 3.61 MIL/uL — ABNORMAL LOW (ref 4.22–5.81)
RDW: 16 % — ABNORMAL HIGH (ref 11.5–15.5)
Smear Review: NORMAL
WBC: 5.3 K/uL (ref 4.0–10.5)
nRBC: 0 % (ref 0.0–0.2)

## 2024-05-01 LAB — BASIC METABOLIC PANEL WITH GFR
Anion gap: 9 (ref 5–15)
BUN: 15 mg/dL (ref 8–23)
CO2: 25 mmol/L (ref 22–32)
Calcium: 8.9 mg/dL (ref 8.9–10.3)
Chloride: 105 mmol/L (ref 98–111)
Creatinine, Ser: 1.04 mg/dL (ref 0.61–1.24)
GFR, Estimated: >60 mL/min (ref >60)
Glucose, Bld: 102 mg/dL — ABNORMAL HIGH (ref 70–99)
Potassium: 4.1 mmol/L (ref 3.5–5.1)
Sodium: 139 mmol/L (ref 135–145)

## 2024-05-01 MED ORDER — PANTOPRAZOLE SODIUM 40 MG PO TBEC: 40.0000 mg | DELAYED_RELEASE_TABLET | Freq: Every day | ORAL | Status: AC

## 2024-05-01 MED ORDER — POLYETHYLENE GLYCOL 3350 17 G PO PACK: 17.0000 g | PACK | Freq: Every day | ORAL | Status: AC

## 2024-05-01 MED ORDER — ALUM & MAG HYDROXIDE-SIMETH 200-200-20 MG/5ML PO SUSP: 30.0000 mL | Freq: Four times a day (QID) | ORAL | Status: AC | PRN

## 2024-05-01 MED ORDER — OXYCODONE HCL 5 MG PO TABS: 5.0000 mg | ORAL_TABLET | Freq: Four times a day (QID) | ORAL | 0 refills | Status: AC | PRN

## 2024-05-01 MED ORDER — ALBUTEROL SULFATE (2.5 MG/3ML) 0.083% IN NEBU: 2.5000 mg | INHALATION_SOLUTION | Freq: Four times a day (QID) | RESPIRATORY_TRACT | Status: AC | PRN

## 2024-05-01 MED ORDER — METHOCARBAMOL 500 MG PO TABS: 500.0000 mg | ORAL_TABLET | Freq: Three times a day (TID) | ORAL | Status: AC | PRN

## 2024-05-01 MED ORDER — MIRTAZAPINE 30 MG PO TABS: 30.0000 mg | ORAL_TABLET | Freq: Every day | ORAL | Status: AC

## 2024-05-01 NOTE — Progress Notes (Signed)
 Attempted to call report. Secretary transferred to a number that would not take a message. Will try again in a few minutes.

## 2024-05-01 NOTE — Discharge Summary (Signed)
 Physician Discharge Summary  Fred Mcintosh FMW:[id number] DOB: 1961/05/17 DOA: 03/27/2024  PCP: [name], MD  Admit date: 03/27/2024 Discharge date: 05/01/2024  Admitted From: (Home) Disposition:  [facility] in [place])  Recommendations for Outpatient Follow-up:  Please obtain BMP/CBC in one week Please follow up with orthopedic Dr. Harden in 7 to 10 days  Diet recommendation:  Regular   Brief/Interim Summary:  Brief Summary: Patient is a 63 y.o.  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  Admitted to the ICU-orthopedic consulted-underwent left AKA-subsequently extubated-stabilized and transferred to [facility].   Significant events: 9/10>> septic shock-admit to ICU-underwent left AKA 9/11>> extubated. 9/15>> transferred to [facility].   Significant studies: 9/10>> CXR: No active disease. 9/10>> left foot x-ray: There is surrounding TMA stump-as well as lower calf. 9/10>> x-ray left tibia/fibula: Surrounding TMA stump-and lower half of leg. 9/14>> TTE: EF 70-75%, grade 1 diastolic dysfunction 916>> TEE: No vegetation. 9/17>> CT head: No acute abnormality 9/22>> MRI brain: No acute intracranial abnormality.   Significant microbiology data: 9/10>> blood culture: Streptococcus anginosus. 9/12>> blood culture: No growth   Procedures: 9/10>> left AKA 9/16>> TEE   Consults: Orthopedic PCCM Infectious disease  Septic shock secondary to left lower extremity necrotizing fasciitis and streptococcal bacteremia Sepsis physiology has resolved S/p left AKA by Dr. Harden on 9/10--pain management Robaxin  500 IV every 8 changed to oral tomorrow, continue Tylenol  650 and Oxy IR 5 every 6 as needed moderate pain TEE negative for endocarditis IV penicillin  G completed 9/26 - Is afebrile, no leukocytosis, wound appears to be with a small area  with dehiscence, as I have discussed with Dr. Harden, recommendation for local wound care with washing with soap water  daily, 4 x 4 gauze, Ace wrap and apply shrinker after that, and will need to follow-up with Ortho in 7 to 10 days as an outpatient..   Acute metabolic encephalopathy Secondary to sepsis physiology Neuroimaging negative--careful use Remeron  30 for sleep Coherent x 4   Vomiting Resolved   Normocytic anemia  Likely secondary to critical/acute illness Follow CBC periodically   Hypothyroidism Synthroid  50 mcg daily need TSH in several weeks   HTN BP stable Currently not on any antihypertensives   Bronchial asthma Stable/no wheezing As needed bronchodilators.   DM-2 (A1c 6.7 on 9/10) Discontinued all insulin  all CBG checks and placed on metformin  500 twice daily at patient request CBGs are 80s to 100   Pressure ulcer left buttock  treated with Medihoney Pictures as per prior-he continues to turn some in bed I have encouraged him to continue turning Wound nurse to reassess as able     Class I obesity BMI variable currently 32.7      Discharge Diagnoses:  Principal Problem:   Necrotizing fasciitis (HCC) Active Problems:   Septic shock (HCC)   Altered mental status   Hyperosmolar hyperglycemic state (HHS) (HCC)   Streptococcal bacteremia    Discharge Instructions  Discharge Instructions     Diet - low sodium heart healthy   Complete by: As directed    Discharge instructions   Complete by: As directed    Follow with Primary MD [name], MD /SNF physician  Get CBC, CMP, checked  by Primary MD next visit.   Disposition : SNF   Diet: regular diet  On your next visit with your primary care physician please Get Medicines reviewed and adjusted.   Please  request your Prim.MD to go over all Hospital Tests and Procedure/Radiological results at the follow up, please get all Hospital records sent to your Prim MD by signing hospital release before  you go home.   If you experience worsening of your admission symptoms, develop shortness of breath, life threatening emergency, suicidal or homicidal thoughts you must seek medical attention immediately by calling 911 or calling your MD immediately  if symptoms less severe.  You Must read complete instructions/literature along with all the possible adverse reactions/side effects for all the Medicines you take and that have been prescribed to you. Take any new Medicines after you have completely understood and accpet all the possible adverse reactions/side effects.   Do not drive, operating heavy machinery, perform activities at heights, swimming or participation in water  activities or provide baby sitting services if your were admitted for syncope or siezures until you have seen by Primary MD or a Neurologist and advised to do so again.  Do not drive when taking Pain medications.    Do not take more than prescribed Pain, Sleep and Anxiety Medications  Special Instructions: If you have smoked or chewed Tobacco  in the last 2 yrs please stop smoking, stop any regular Alcohol  and or any Recreational drug use.  Wear Seat belts while driving.   Please note  You were cared for by a hospitalist during your hospital stay. If you have any questions about your discharge medications or the care you received while you were in the hospital after you are discharged, you can call the unit and asked to speak with the hospitalist on call if the hospitalist that took care of you is not available. Once you are discharged, your primary care physician will handle any further medical issues. Please note that NO REFILLS for any discharge medications will be authorized once you are discharged, as it is imperative that you return to your primary care physician (or establish a relationship with a primary care physician if you do not have one) for your aftercare needs so that they can reassess your need for medications  and monitor your lab values.   Discharge wound care:   Complete by: As directed    Wound care  Every shift      Comments: Area of dehiscence on left AKA wound, please wash with soap water  daily, apply 4 x 4 gauze, with Ace wrap and shrinker daily  ------------------------    Wound care  Daily      Comments: Apply Medihoney to sacrum and buttock wounds Q day, then cover with foam dressing.  Change foam dressing Q 3 days or PRN soiling   Increase activity slowly   Complete by: As directed       Allergies as of 05/01/2024   No Known Allergies      Medication List     STOP taking these medications    oxyCODONE -acetaminophen  5-325 MG tablet Commonly known as: PERCOCET/ROXICET       TAKE these medications    acetaminophen  325 MG tablet Commonly known as: TYLENOL  Take 2 tablets (650 mg total) by mouth every 6 (six) hours as needed for mild pain (pain score 1-3) or fever (or Fever >/= 101).   albuterol  (2.5 MG/3ML) 0.083% nebulizer solution Commonly known as: PROVENTIL  Take 3 mLs (2.5 mg total) by nebulization every 6 (six) hours as needed for wheezing or shortness of breath.   alum & mag hydroxide-simeth 200-200-20 MG/5ML suspension Commonly known as: MAALOX/MYLANTA Take 30 mLs by  mouth every 6 (six) hours as needed for indigestion or heartburn.   levothyroxine  50 MCG tablet Commonly known as: SYNTHROID  Take 50 mcg by mouth daily before breakfast.   metFORMIN  500 MG tablet Commonly known as: GLUCOPHAGE  Take 2 tablets (1,000 mg total) by mouth 2 (two) times daily with a meal.   methocarbamol  500 MG tablet Commonly known as: ROBAXIN  Take 1 tablet (500 mg total) by mouth every 8 (eight) hours as needed for muscle spasms.   mirtazapine  30 MG tablet Commonly known as: REMERON  Take 1 tablet (30 mg total) by mouth at bedtime.   oxyCODONE  5 MG immediate release tablet Commonly known as: Oxy IR/ROXICODONE  Take 1 tablet (5 mg total) by mouth every 6 (six) hours as needed  for severe pain (pain score 7-10).   pantoprazole  40 MG tablet Commonly known as: PROTONIX  Take 1 tablet (40 mg total) by mouth daily. Start taking on: May 02, 2024   polyethylene glycol 17 g packet Commonly known as: MIRALAX  / GLYCOLAX  Take 17 g by mouth daily. Start taking on: May 02, 2024               Discharge Care Instructions  (From admission, onward)           Start     Ordered   05/01/24 0000  Discharge wound care:       Comments: Wound care  Every shift      Comments: Area of dehiscence on left AKA wound, please wash with soap water  daily, apply 4 x 4 gauze, with Ace wrap and shrinker daily  ------------------------    Wound care  Daily      Comments: Apply Medihoney to sacrum and buttock wounds Q day, then cover with foam dressing.  Change foam dressing Q 3 days or PRN soiling   05/01/24 1124            Contact information for follow-up providers     Harden Jerona GAILS, MD Follow up in 1 week(s).   Specialty: Orthopedic Surgery Contact information: [address] [place] [address] [phone]              Contact information for after-discharge care     Destination     [facility] .   Service: Skilled Nursing Contact information: [address] [place]  [address] [phone]                    No Known Allergies    Procedures/Studies: MR BRAIN WO CONTRAST Result Date: 04/08/2024 CLINICAL DATA:  63 year old male with recent altered mental status, persistent confusion. EXAM: MRI HEAD WITHOUT CONTRAST TECHNIQUE: Multiplanar, multiecho pulse sequences of the brain and surrounding structures were obtained without intravenous contrast. COMPARISON:  Head CT 04/03/2024. FINDINGS: Study is mildly degraded by motion artifact despite repeated imaging attempts. Brain: No restricted diffusion to suggest acute infarction. No midline shift, mass effect, evidence of mass  lesion, ventriculomegaly, extra-axial collection or acute intracranial hemorrhage. Cervicomedullary junction and pituitary are within normal limits. Patchy periventricular, scattered cerebral white matter T2 and FLAIR hyperintensity, extent appears mild for age when allowing for motion artifact. No cortical encephalomalacia identified. No chronic cerebral blood products identified on susceptibility imaging. Deep gray nuclei, brainstem, and cerebellum appear negative. Vascular: Major intracranial vascular flow voids are preserved. Skull and upper cervical spine: Negative. Visualized bone marrow signal is within normal limits. Sinuses/Orbits: Negative. Other: Trace right mastoid effusion.  Negative visible nasopharynx. IMPRESSION: Mildly  motion degraded exam with no acute intracranial abnormality. Electronically Signed   By: VEAR Hurst M.D.   On: 04/08/2024 13:09   CT HEAD WO CONTRAST ( ) Result Date: 04/04/2024 CLINICAL DATA:  63 year old male with altered mental status. Recent shortness of breath. EXAM: CT HEAD WITHOUT CONTRAST TECHNIQUE: Contiguous axial images were obtained from the base of the skull through the vertex without intravenous contrast. RADIATION DOSE REDUCTION: This exam was performed according to the departmental dose-optimization program which includes automated exposure control, adjustment of the mA and/or kV according to patient size and/or use of iterative reconstruction technique. COMPARISON:  None Available. FINDINGS: Brain: No midline shift, ventriculomegaly, mass effect, evidence of mass lesion, intracranial hemorrhage or evidence of cortically based acute infarction. Gray-white differentiation is within normal limits for age. Vascular: No suspicious intracranial vascular hyperdensity. Mild Calcified atherosclerosis at the skull base. Skull: Intact. No acute osseous abnormality identified. Sinuses/Orbits: Visualized paranasal sinuses and mastoids are clear. Other: Visualized orbits and  scalp soft tissues are within normal limits. IMPRESSION: No acute intracranial abnormality. Normal for age noncontrast CT appearance of the brain. Electronically Signed   By: VEAR Hurst M.D.   On: 04/04/2024 05:12   DG Swallowing Func-Speech Pathology Result Date: 04/03/2024 Table formatting from the original result was not included. Modified Barium Swallow Study Patient Details Name: Fred Mcintosh MRN: [id number] Date of Birth: [birth year] Today's Date: 04/03/2024 HPI/PMH: HPI: 63 yo male admitted 9/10 with AMS and weakness after not getting OOB for 9 days. 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 ETT 9/10-9/11. PMH includes: recent L foot amputation, DM, HTN, asthma, Graves disease Clinical Impression: Clinical Impression: Pt exhibits mild oropharyngeal dysphagia primarily affected by his impulsivity. There is adequate laryngeal closure but the swallow is initiated at the pyriform sinuses, contributing to trace penetration (PAS 3). A cued cough helps clear these penetrates. There is no significant residue but his inattention contributes to prolonged mastication and suspected timing deficits. Recommend starting with Dys 2 solids and thin liquids in an attempt to control rate and ensure adequate mastication. Discussed with MD and RN. Will continue following to assess readiness to advance diet given fluctuating mentation. Factors that may increase risk of adverse event in presence of aspiration Noe & Lianne 2021): Factors that may increase risk of adverse event in presence of aspiration Noe & Lianne 2021): Reduced cognitive function; Limited mobility Recommendations/Plan: Swallowing Evaluation Recommendations Swallowing Evaluation Recommendations Recommendations: PO diet PO Diet Recommendation: Dysphagia 2 (Finely chopped); Thin liquids (Level 0) Liquid Administration via: Cup; Straw Medication Administration: Whole meds with puree Supervision: Patient able to self-feed; Full  supervision/cueing for swallowing strategies Swallowing strategies  : Minimize environmental distractions; Slow rate; Small bites/sips Postural changes: Position pt fully upright for meals; Stay upright 30-60 min after meals Oral care recommendations: Oral care BID (2x/day) Treatment Plan Treatment Plan Treatment recommendations: Therapy as outlined in treatment plan below Follow-up recommendations: Skilled nursing-short term rehab (<3 hours/day) Functional status assessment: Patient has had a recent decline in their functional status and demonstrates the ability to make significant improvements in function in a reasonable and predictable amount of time. Treatment frequency: Min 2x/week Treatment duration: 2 weeks Interventions: Aspiration precaution training; Patient/family education; Trials of upgraded texture/liquids; Diet toleration management by SLP Recommendations Recommendations for follow up therapy are one component of a multi-disciplinary discharge planning process, led by the attending physician.  Recommendations may be updated based on patient status, additional functional criteria and insurance authorization. Assessment: Orofacial Exam:  Orofacial Exam Oral Cavity: Oral Hygiene: WFL Oral Cavity - Dentition: Adequate natural dentition Orofacial Anatomy: WFL Oral Motor/Sensory Function: WFL Anatomy: Anatomy: Suspected cervical osteophytes Boluses Administered: Boluses Administered Boluses Administered: Thin liquids (Level 0); Mildly thick liquids (Level 2, nectar thick); Moderately thick liquids (Level 3, honey thick); Puree; Solid  Oral Impairment Domain: Oral Impairment Domain Lip Closure: No labial escape Tongue control during bolus hold: Posterior escape of less than half of bolus Bolus preparation/mastication: Timely and efficient chewing and mashing Bolus transport/lingual motion: Brisk tongue motion Oral residue: Complete oral clearance Location of oral residue : N/A Initiation of pharyngeal swallow  : Pyriform sinuses  Pharyngeal Impairment Domain: Pharyngeal Impairment Domain Soft palate elevation: No bolus between soft palate (SP)/pharyngeal wall (PW) Laryngeal elevation: Complete superior movement of thyroid  cartilage with complete approximation of arytenoids to epiglottic petiole Anterior hyoid excursion: Complete anterior movement Epiglottic movement: Complete inversion Laryngeal vestibule closure: Complete, no air/contrast in laryngeal vestibule Pharyngeal stripping wave : Present - complete Pharyngeal contraction (A/P view only): N/A Pharyngoesophageal segment opening: -- (unable to visualize) Tongue base retraction: No contrast between tongue base and posterior pharyngeal wall (PPW) Pharyngeal residue: Trace residue within or on pharyngeal structures Location of pharyngeal residue: Valleculae  Esophageal Impairment Domain: No data recorded Pill: No data recorded Penetration/Aspiration Scale Score: Penetration/Aspiration Scale Score 1.  Material does not enter airway: Mildly thick liquids (Level 2, nectar thick); Moderately thick liquids (Level 3, honey thick); Puree; Solid 3.  Material enters airway, remains ABOVE vocal cords and not ejected out: Thin liquids (Level 0) Compensatory Strategies: Compensatory Strategies Compensatory strategies: No   General Information: Caregiver present: No  Diet Prior to this Study: NPO   Temperature : Normal   Respiratory Status: WFL   Supplemental O2: None (Room air)   History of Recent Intubation: Yes  Behavior/Cognition: Alert; Cooperative Self-Feeding Abilities: Able to self-feed Baseline vocal quality/speech: Normal Volitional Cough: Able to elicit Volitional Swallow: Able to elicit Exam Limitations: Limited visibility Goal Planning: Prognosis for improved oropharyngeal function: Good Barriers to Reach Goals: Cognitive deficits No data recorded Patient/Family Stated Goal: asking for food Consulted and agree with results and recommendations: Patient; Physician;  Nurse; Dietitian Pain: Pain Assessment Pain Assessment: No/denies pain Faces Pain Scale: 0 Breathing: 0 Negative Vocalization: 0 Facial Expression: 0 Body Language: 0 Consolability: 0 PAINAD Score: 0 Pain Location: pain response with movement of LLE Pain Descriptors / Indicators: Grimacing; Guarding Pain Intervention(s): Monitored during session End of Session: Start Time:SLP Start Time (ACUTE ONLY): 1040 Stop Time: SLP Stop Time (ACUTE ONLY): 1100 Time Calculation:SLP Time Calculation (min) (ACUTE ONLY): 20 min Charges: SLP Evaluations $ SLP Speech Visit: 1 Visit SLP Evaluations $MBS Swallow: 1 Procedure $Swallowing Treatment: 1 Procedure SLP visit diagnosis: SLP Visit Diagnosis: Dysphagia, oropharyngeal phase (R13.12) Past Medical History: Past Medical History: Diagnosis Date  Acute respiratory failure with hypoxia (HCC) 07/13/2012  Secondary to multi-lobar bilateral pneumonia  Anemia   Asthma   Community acquired pneumonia 07/13/2012  Bilateral/multi-lobar  Diabetes mellitus without complication (HCC)   type 2  Graves disease   Hypertension  Past Surgical History: Past Surgical History: Procedure Laterality Date  AMPUTATION Left 03/08/2024  Procedure: LEFT TRANSMETATARSAL AMPUTATION;  Surgeon: Harden Jerona GAILS, MD;  Location: [facility] OR;  Service: Orthopedics;  Laterality: Left;  AMPUTATION Left 03/27/2024  Procedure: AMPUTATION, ABOVE KNEE LEFT;  Surgeon: Harden Jerona GAILS, MD;  Location: [facility] OR;  Service: Orthopedics;  Laterality: Left;  AMPUTATION TOE Left 01/20/2024  Procedure: AMPUTATION, TOE;  Surgeon: Harden,  Jerona GAILS, MD;  Location: [facility] OR;  Service: Orthopedics;  Laterality: Left;  LEFT FOOT 2ND RAY  TRANSESOPHAGEAL ECHOCARDIOGRAM (CATH LAB) N/A 04/02/2024  Procedure: TRANSESOPHAGEAL ECHOCARDIOGRAM;  Surgeon: Shlomo Wilbert SAUNDERS, MD;  Location: [facility] INVASIVE CV LAB;  Service: Cardiovascular;  Laterality: N/A; Damien Blumenthal, M.A., CCC-SLP Speech Language Pathology, Acute Rehabilitation Services Secure Chat preferred [phone]  04/03/2024, 11:48 AM  ECHO TEE Result Date: 04/02/2024    TRANSESOPHOGEAL ECHO REPORT   Patient Name:   Fred Mcintosh Date of Exam: 04/02/2024 Medical Rec #:  [id number]          Height:       74.0 in Accession #:    [id number]         Weight:       274.7 lb Date of Birth:  21-Apr-1961         BSA:          2.487 m Patient Age:    62 years           BP:           109/88 mmHg Patient Gender: M                  HR:           92 bpm. Exam Location:  Inpatient Procedure: Transesophageal Echo, Cardiac Doppler and Color Doppler (Both            Spectral and Color Flow Doppler were utilized during procedure). Indications:     Subacute bacterial endocarditis;  History:         Patient has prior history of Echocardiogram examinations, most                  recent 03/31/2024. Signs/Symptoms:Altered Mental Status and                  Bacteremia; Risk Factors:Hypertension.  Sonographer:     Ellouise Mose RDCS Referring Phys:  [id number] WADDELL A PARCELLS Diagnosing Phys: Wilbert Shlomo MD PROCEDURE: After discussion of the risks and benefits of a TEE, an informed consent was obtained from a family member. TEE procedure time was 6 minutes. The transesophogeal probe was passed without difficulty through the esophogus of the patient. Imaged were obtained with the patient in a left lateral decubitus position. Local oropharyngeal anesthetic was provided with viscous lidocaine . Sedation performed by different physician. The patient was monitored while under deep sedation. Anesthestetic sedation was provided intravenously by Anesthesiology: 220mg  of Propofol . Image quality was good. The patient's vital signs; including heart rate, blood pressure, and oxygen saturation; remained stable throughout the procedure. The patient developed no complications during the procedure. Transgastric views not obtained: inability to pass probe.  IMPRESSIONS  1. Left ventricular ejection fraction, by estimation, is 65 to 70%. The left ventricle has normal  function. The left ventricle has no regional wall motion abnormalities.  2. Right ventricular systolic function is normal. The right ventricular size is normal.  3. No left atrial/left atrial appendage thrombus was detected. The LAA emptying velocity was 87 cm/s.  4. The mitral valve is normal in structure. Trivial mitral valve regurgitation. No evidence of mitral stenosis.  5. The aortic valve is tricuspid. Aortic valve regurgitation is not visualized. No aortic stenosis is present.  6. Aortic dilatation noted. There is mild dilatation of the ascending aorta, measuring 38 mm.  7. The inferior vena cava is normal in size with greater than 50% respiratory variability, suggesting right atrial pressure of  3 mmHg. Conclusion(s)/Recommendation(s): Normal biventricular function without evidence of hemodynamically significant valvular heart disease. No evidence of vegetation/infective endocarditis on this transesophageael echocardiogram. FINDINGS  Left Ventricle: Left ventricular ejection fraction, by estimation, is 65 to 70%. The left ventricle has normal function. The left ventricle has no regional wall motion abnormalities. The left ventricular internal cavity size was normal in size. There is  no left ventricular hypertrophy. Right Ventricle: The right ventricular size is normal. No increase in right ventricular wall thickness. Right ventricular systolic function is normal. Left Atrium: Left atrial size was normal in size. No left atrial/left atrial appendage thrombus was detected. The LAA emptying velocity was 87 cm/s. Right Atrium: Right atrial size was normal in size. Pericardium: There is no evidence of pericardial effusion. Mitral Valve: The mitral valve is normal in structure. Trivial mitral valve regurgitation. No evidence of mitral valve stenosis. Tricuspid Valve: The tricuspid valve is normal in structure. Tricuspid valve regurgitation is trivial. No evidence of tricuspid stenosis. Aortic Valve: The aortic  valve is tricuspid. Aortic valve regurgitation is not visualized. No aortic stenosis is present. Pulmonic Valve: The pulmonic valve was normal in structure. Pulmonic valve regurgitation is trivial. No evidence of pulmonic stenosis. Aorta: Aortic dilatation noted. There is mild dilatation of the ascending aorta, measuring 38 mm. Venous: The inferior vena cava is normal in size with greater than 50% respiratory variability, suggesting right atrial pressure of 3 mmHg. IAS/Shunts: No atrial level shunt detected by color flow Doppler. Additional Comments: Spectral Doppler performed. LEFT VENTRICLE PLAX 2D LVOT diam:     2.60 cm LVOT Area:     5.31 cm   AORTA Ao Root diam: 3.67 cm Ao Asc diam:  3.80 cm  SHUNTS Systemic Diam: 2.60 cm Wilbert Bihari MD Electronically signed by Wilbert Bihari MD Signature Date/Time: 04/02/2024/4:20:10 PM    Final    EP STUDY Result Date: 04/02/2024 See surgical note for result.  DG Chest Port 1 View Result Date: 04/02/2024 CLINICAL DATA:  Shortness of breath. EXAM: PORTABLE CHEST 1 VIEW COMPARISON:  03/27/2024 FINDINGS: Cardiopericardial silhouette is at upper limits of normal for size. The lungs are clear without focal pneumonia, edema, pneumothorax or pleural effusion. A feeding tube passes into the stomach although the distal tip position is not included on the film. No acute bony abnormality. Telemetry leads overlie the chest. IMPRESSION: No acute cardiopulmonary findings. Electronically Signed   By: Camellia Candle M.D.   On: 04/02/2024 06:52      Subjective:  No significant events overnight, he denies any complaints today, he had a good breakfast this morning Discharge Exam: Vitals:   05/01/24 0535 05/01/24 0805  BP: (!) 133/92 134/87  Pulse: 94   Resp: 16   Temp: 98.6 F (37 C) 98.5 F (36.9 C)  SpO2: 94%    Vitals:   05/01/24 0300 05/01/24 0535 05/01/24 0804 05/01/24 0805  BP:  (!) 133/92  134/87  Pulse: 93 94    Resp: 16 16    Temp:  98.6 F (37 C)  98.5 F  (36.9 C)  TempSrc:  Oral  Oral  SpO2: 97% 94%    Weight:   118.2 kg   Height:        General: Pt is alert, awake, not in acute distress Cardiovascular: RRR, S1/S2 +, no rubs, no gallops Respiratory: CTA bilaterally, no wheezing, no rhonchi Abdominal: Soft, NT, ND, bowel sounds + Extremities: no edema, no cyanosis, left AKA    The results of significant diagnostics from  this hospitalization (including imaging, microbiology, ancillary and laboratory) are listed below for reference.     Microbiology: No results found for this or any previous visit (from the past 240 hours).   Labs: BNP (last 3 results) No results for input(s): BNP in the last 8760 hours. Basic Metabolic Panel: Recent Labs  Lab 04/30/24 1112 05/01/24 0412  NA 135 139  K 4.1 4.1  CL 101 105  CO2 25 25  GLUCOSE 179* 102*  BUN 14 15  CREATININE 1.11 1.04  CALCIUM  8.6* 8.9   Liver Function Tests: Recent Labs  Lab 04/30/24 1112  AST 15  ALT 11  ALKPHOS 74  BILITOT 0.7  PROT 6.2*  ALBUMIN 2.3*   No results for input(s): LIPASE, AMYLASE in the last 168 hours. No results for input(s): AMMONIA in the last 168 hours. CBC: Recent Labs  Lab 04/30/24 1112 05/01/24 0412  WBC 4.9 5.3  NEUTROABS 2.3 2.6  HGB 10.0* 10.6*  HCT 31.1* 32.6*  MCV 91.7 90.3  PLT 177 192   Cardiac Enzymes: No results for input(s): CKTOTAL, CKMB, CKMBINDEX, TROPONINI in the last 168 hours. BNP: Invalid input(s): POCBNP CBG: Recent Labs  Lab 04/28/24 0720 04/28/24 1202 04/29/24 0749 04/29/24 1131 04/29/24 1715  GLUCAP 79 119* 83 100* 80   D-Dimer No results for input(s): DDIMER in the last 72 hours. Hgb A1c No results for input(s): HGBA1C in the last 72 hours. Lipid Profile No results for input(s): CHOL, HDL, LDLCALC, TRIG, CHOLHDL, LDLDIRECT in the last 72 hours. Thyroid  function studies No results for input(s): TSH, T4TOTAL, T3FREE, THYROIDAB in the last 72  hours.  Invalid input(s): FREET3 Anemia work up No results for input(s): VITAMINB12, FOLATE, FERRITIN, TIBC, IRON, RETICCTPCT in the last 72 hours. Urinalysis    Component Value Date/Time   COLORURINE AMBER (A) 03/27/2024 0934   APPEARANCEUR HAZY (A) 03/27/2024 0934   LABSPEC 1.016 03/27/2024 0934   PHURINE 5.0 03/27/2024 0934   GLUCOSEU >=500 (A) 03/27/2024 0934   HGBUR LARGE (A) 03/27/2024 0934   BILIRUBINUR NEGATIVE 03/27/2024 0934   KETONESUR NEGATIVE 03/27/2024 0934   PROTEINUR NEGATIVE 03/27/2024 0934   NITRITE NEGATIVE 03/27/2024 0934   LEUKOCYTESUR TRACE (A) 03/27/2024 0934   Sepsis Labs Recent Labs  Lab 04/30/24 1112 05/01/24 0412  WBC 4.9 5.3   Microbiology No results found for this or any previous visit (from the past 240 hours).   Time coordinating discharge: Over 30 minutes  SIGNED:   Brayton Lye, MD  [facility] 05/01/2024, 11:35 AM Pager   If 7PM-7AM, please contact night-coverage [email or web address]

## 2024-05-01 NOTE — Progress Notes (Signed)
 Physical Therapy Treatment Patient Details Name: Fred Mcintosh MRN: [id number] DOB: May 07, 1961 Today's Date: 05/01/2024   History of Present Illness 63 y.o. 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 Mod/Max A to stand in stedy x3 today. No change in DC/DME recs at this time. Pt anticipates DC to SNF today.    If plan is discharge home, recommend the following: Two people to help with walking and/or transfers;Two people to help with bathing/dressing/bathroom;Direct supervision/assist for medications management;Assistance with feeding;Assistance with cooking/housework;Direct supervision/assist for financial management;Supervision due to cognitive status;Assist for transportation;Help with stairs or ramp for entrance   Can travel by private vehicle     No  Equipment Recommendations  Wheelchair (measurements PT);Wheelchair cushion (measurements PT);BSC/3in1;Hospital bed;Hoyer lift    Recommendations for Other Services       Precautions / Restrictions Precautions Precautions: Fall;Other (comment) Recall of Precautions/Restrictions: Impaired Precaution/Restrictions Comments: L AKA, sacral wound Required Braces or Orthoses: Other Brace Other Brace: L LE shrinker Restrictions Weight Bearing Restrictions Per Provider Order: No     Mobility  Bed Mobility Overal bed mobility: Needs Assistance Bed Mobility: Supine to Sit, Sit to Supine     Supine to sit: Supervision, HOB elevated, Used rails Sit to supine: Supervision   General bed mobility comments: increased time, HOB significantly elevated and definite use of bed rail    Transfers Overall transfer level: Needs assistance Equipment used: Ambulation equipment used Transfers: Sit to/from Stand Sit to Stand: Mod  assist, Max assist, From elevated surface           General transfer comment: Mod/Max A to stand x3 in stedy.    Ambulation/Gait               General Gait Details: unable   Stairs             Wheelchair Mobility     Tilt Bed    Modified Rankin (Stroke Patients Only)       Balance Overall balance assessment: Needs assistance Sitting-balance support: Feet supported, No upper extremity supported Sitting balance-Leahy Scale: Good Sitting balance - Comments: able to perform grooming, UB ADLs and UB HEP seated on EOB   Standing balance support: Reliant on assistive device for balance Standing balance-Leahy Scale: Poor Standing balance comment: pt dependent on external support                            Communication Communication Communication: No apparent difficulties  Cognition Arousal: Alert Behavior During Therapy: WFL for tasks assessed/performed   PT - Cognitive impairments: No family/caregiver present to determine baseline, Problem solving, Safety/Judgement, Awareness                       PT - Cognition Comments: pt with depressed spirits but actively participated with encouragement from therapist. Following commands: Intact Following commands impaired: Follows one step commands with increased time    Cueing Cueing Techniques: Verbal cues  Exercises      General Comments General comments (skin integrity, edema, etc.): VSS      Pertinent Vitals/Pain Pain Assessment Pain Assessment: No/denies pain    Home Living  Prior Function            PT Goals (current goals can now be found in the care plan section) Progress towards PT goals: Progressing toward goals    Frequency    Min 2X/week      PT Plan      Co-evaluation              AM-PAC PT 6 Clicks Mobility   Outcome Measure  Help needed turning from your back to your side while in a flat bed without using  bedrails?: A Lot Help needed moving from lying on your back to sitting on the side of a flat bed without using bedrails?: A Lot Help needed moving to and from a bed to a chair (including a wheelchair)?: Total Help needed standing up from a chair using your arms (e.g., wheelchair or bedside chair)?: Total Help needed to walk in hospital room?: Total Help needed climbing 3-5 steps with a railing? : Total 6 Click Score: 8    End of Session Equipment Utilized During Treatment: Gait belt Activity Tolerance: Patient tolerated treatment well Patient left: in bed;with call bell/phone within reach Nurse Communication: Mobility status PT Visit Diagnosis: Difficulty in walking, not elsewhere classified (R26.2);Unsteadiness on feet (R26.81);Other abnormalities of gait and mobility (R26.89);Muscle weakness (generalized) (M62.81)     Time: 8667-8656 PT Time Calculation (min) (ACUTE ONLY): 11 min  Charges:    $Therapeutic Activity: 8-22 mins PT General Charges $$ ACUTE PT VISIT: 1 Visit                     Fred Mcintosh, PT, DPT Acute Rehab Services [phone]    Fred Mcintosh 05/01/2024, 2:32 PM

## 2024-05-01 NOTE — Progress Notes (Signed)
 Called report to Darold, LPN at [facility]. [facility] to pick patient up at 1400.

## 2024-05-01 NOTE — Discharge Instructions (Addendum)
 Follow with Primary MD [name], MD /SNF physician  Get CBC, CMP, checked  by Primary MD next visit.   Disposition : SNF   Diet: regular diet  On your next visit with your primary care physician please Get Medicines reviewed and adjusted.   Please request your Prim.MD to go over all Hospital Tests and Procedure/Radiological results at the follow up, please get all Hospital records sent to your Prim MD by signing hospital release before you go home.   If you experience worsening of your admission symptoms, develop shortness of breath, life threatening emergency, suicidal or homicidal thoughts you must seek medical attention immediately by calling 911 or calling your MD immediately  if symptoms less severe.  You Must read complete instructions/literature along with all the possible adverse reactions/side effects for all the Medicines you take and that have been prescribed to you. Take any new Medicines after you have completely understood and accpet all the possible adverse reactions/side effects.   Do not drive, operating heavy machinery, perform activities at heights, swimming or participation in water  activities or provide baby sitting services if your were admitted for syncope or siezures until you have seen by Primary MD or a Neurologist and advised to do so again.  Do not drive when taking Pain medications.    Do not take more than prescribed Pain, Sleep and Anxiety Medications  Special Instructions: If you have smoked or chewed Tobacco  in the last 2 yrs please stop smoking, stop any regular Alcohol  and or any Recreational drug use.  Wear Seat belts while driving.   Please note  You were cared for by a hospitalist during your hospital stay. If you have any questions about your discharge medications or the care you received while you were in the hospital after you are discharged, you can call the unit and asked to speak with the hospitalist on call if the hospitalist that  took care of you is not available. Once you are discharged, your primary care physician will handle any further medical issues. Please note that NO REFILLS for any discharge medications will be authorized once you are discharged, as it is imperative that you return to your primary care physician (or establish a relationship with a primary care physician if you do not have one) for your aftercare needs so that they can reassess your need for medications and monitor your lab values.

## 2024-05-01 NOTE — TOC Transition Note (Signed)
 Transition of Care [facility]) - Discharge Note   Patient Details  Name: Fred Mcintosh MRN: [id number] Date of Birth: [birth year]  Transition of Care [facility]) CM/SW Contact:  Inocente GORMAN Kindle, LCSW Phone Number: 05/01/2024, 2:06 PM   Clinical Narrative:    Patient will DC to: [facility] Anticipated DC date: 05/01/24 Family notified: Pt notified family Transport by: [facility]   Per MD patient ready for DC to [place]. RN to call report prior to discharge [phone] room B22-2). RN, patient, patient's family, and facility notified of DC. Discharge Summary and FL2 sent to facility. DC packet on chart including signed scripts. Ambulance transport requested for patient.   CSW will sign off for now as social work intervention is no longer needed. Please consult us  again if new needs arise.     Final next level of care: Skilled Nursing Facility Barriers to Discharge: Barriers Resolved   Patient Goals and CMS Choice Patient states their goals for this hospitalization and ongoing recovery are:: Rehab CMS Medicare.gov Compare Post Acute Care list provided to:: Patient Represenative (must comment) Choice offered to / list presented to : Sibling [place]log[place] ownership interest in [facility].provided to:: Sibling    Discharge Placement   Existing PASRR number confirmed : 05/01/24          Patient chooses bed at:  [facility]) Patient to be transferred to facility by: [facility] Name of family member notified: Pt notified Patient and family notified of of transfer: 05/01/24  Discharge Plan and Services Additional resources added to the After Visit Summary for   In-house Referral: Clinical Social Work   Post Acute Care Choice: Skilled Nursing Facility                               Social Drivers of Health (SDOH) Interventions SDOH Screenings   Food Insecurity: No Food Insecurity (03/08/2024)  Housing: Low Risk  (03/08/2024)  Transportation Needs: No Transportation Needs  (03/08/2024)  Utilities: Not At Risk (03/08/2024)  Social Connections: Moderately Integrated (01/18/2024)  Tobacco Use: Unknown (03/27/2024)     Readmission Risk Interventions     No data to display

## 2024-05-01 NOTE — Plan of Care (Signed)
   Problem: Health Behavior/Discharge Planning: Goal: Ability to manage health-related needs will improve Outcome: Progressing   Problem: Clinical Measurements: Goal: Ability to maintain clinical measurements within normal limits will improve Outcome: Progressing

## 2024-05-01 NOTE — TOC Progression Note (Addendum)
 Transition of Care [facility]) - Progression Note    Patient Details  Name: AIYDEN LAUDERBACK MRN: [id number] Date of Birth: 1961/05/04  Transition of Care [facility]) CM/SW Contact  Inocente GORMAN Kindle, LCSW Phone Number: 05/01/2024, 9:03 AM  Clinical Narrative:    9:04 AM-Insurance approval still pending for [facility].   11am-Insurance approval received for [facility]. Updated MD and patient who reported agreement with [facility] for transport.    Expected Discharge Plan: Skilled Nursing Facility Barriers to Discharge: Insurance Authorization               Expected Discharge Plan and Services In-house Referral: Clinical Social Work   Post Acute Care Choice: Skilled Nursing Facility Living arrangements for the past 2 months: Single Family Home                                       Social Drivers of Health (SDOH) Interventions SDOH Screenings   Food Insecurity: No Food Insecurity (03/08/2024)  Housing: Low Risk  (03/08/2024)  Transportation Needs: No Transportation Needs (03/08/2024)  Utilities: Not At Risk (03/08/2024)  Social Connections: Moderately Integrated (01/18/2024)  Tobacco Use: Unknown (03/27/2024)    Readmission Risk Interventions     No data to display

## 2024-05-09 ENCOUNTER — Encounter: Payer: Self-pay | Admitting: Physician Assistant

## 2024-05-09 ENCOUNTER — Ambulatory Visit (INDEPENDENT_AMBULATORY_CARE_PROVIDER_SITE_OTHER): Admitting: Physician Assistant

## 2024-05-09 DIAGNOSIS — T8131XA Disruption of external operation (surgical) wound, not elsewhere classified, initial encounter: Secondary | ICD-10-CM

## 2024-05-09 NOTE — Progress Notes (Signed)
 Office Visit Note   Patient: Fred Mcintosh           Date of Birth: August 24, 1960           MRN: [id number] Visit Date: 05/09/2024              Requested by: [name], MD [address] [place],  [place] [address] PCP: [name], MD  Chief Complaint  Patient presents with   Left Leg - Routine Post Op    03/27/2024 left AKA      HPI: 63 y/o male here for follow up exam of his left AKA.  He had developed a scab over the central incision.  He denies fever and chills.  No drainage.  The SNF MD has been dressing the area with guaze and tape.  He states the stump sock keeps coming off in his sleep.    Assessment & Plan: Visit Diagnoses: No diagnosis found.  Plan: Wet to dry Vashe dressing changes daily.  He was given a small bottle of Vashe.  I recommend he were the black stump sock during the day if possible for continued compression.    Follow-Up Instructions: Return in about 2 weeks (around 05/23/2024).   Ortho Exam  Patient is alert, oriented, no adenopathy, well-dressed, normal affect, normal respiratory effort. The scab was removed.  A Q tip was used to probe the wound.  No tunneling is noted.  The wound is 1 cm x 0.5 cm in size.  No abnormal warmth, no cellulitis or drainage.  The left AKA stump appears viable.    Imaging: No results found. No images are attached to the encounter.  Labs: Lab Results  Component Value Date   HGBA1C 6.7 (H) 03/27/2024   HGBA1C 7.5 (H) 01/18/2024   CRP 4.8 (H) 04/05/2024   CRP 3.9 (H) 04/04/2024   CRP 2.1 (H) 04/03/2024   REPTSTATUS 04/03/2024 FINAL 03/29/2024   REPTSTATUS 04/03/2024 FINAL 03/29/2024   GRAMSTAIN  01/20/2024    RARE WBC SEEN FEW GRAM POSITIVE COCCI RARE GRAM NEGATIVE RODS    CULT  03/29/2024    NO GROWTH 5 DAYS Performed at [facility], [address], [place], [place] [address]    CULT  03/29/2024    NO GROWTH 5 DAYS Performed at [facility], [address] [place], [place]  [address]    [facility] STREPTOCOCCUS ANGINOSIS 03/27/2024     Lab Results  Component Value Date   ALBUMIN 2.3 (L) 04/30/2024   ALBUMIN 1.8 (L) 04/06/2024   ALBUMIN 1.7 (L) 04/05/2024    Lab Results  Component Value Date   MG 2.0 04/23/2024   MG 2.3 04/05/2024   MG 2.7 (H) 04/04/2024   No results found for: VD25OH  No results found for: PREALBUMIN    Latest Ref Rng & Units 05/01/2024    4:12 AM 04/30/2024   11:12 AM 04/23/2024    4:13 AM  CBC EXTENDED  WBC 4.0 - 10.5 K/uL 5.3  4.9  4.7   RBC 4.22 - 5.81 MIL/uL 3.61  3.39  3.12   Hemoglobin 13.0 - 17.0 g/dL 89.3  89.9  9.5   HCT 60.9 - 52.0 % 32.6  31.1  29.0   Platelets 150 - 400 K/uL 192  177  228   NEUT# 1.7 - 7.7 K/uL 2.6  2.3  2.5   Lymph# 0.7 - 4.0 K/uL 2.0  2.1  1.7      There  is no height or weight on file to calculate BMI.  Orders:  No orders of the defined types were placed in this encounter.  No orders of the defined types were placed in this encounter.    Procedures: No procedures performed  Clinical Data: No additional findings.  ROS:  All other systems negative, except as noted in the HPI. Review of Systems  Objective: Vital Signs: There were no vitals taken for this visit.  Specialty Comments:  No specialty comments available.  PMFS History: Patient Active Problem List   Diagnosis Date Noted   Streptococcal bacteremia 04/02/2024   Necrotizing fasciitis (HCC) 03/27/2024   Altered mental status 03/27/2024   Hyperosmolar hyperglycemic state (HHS) (HCC) 03/27/2024   Wound dehiscence, surgical, initial encounter 03/08/2024   Non-healing wound of amputation stump (HCC) 03/08/2024   New onset type 2 diabetes mellitus (HCC) 01/19/2024   Essential hypertension 01/19/2024   Obesity, Class III, BMI 40-49.9 (morbid obesity) (HCC) 01/19/2024   Hyponatremia 01/19/2024   Septic shock (HCC) 01/19/2024   Cutaneous abscess of left foot 01/19/2024   Diabetic infection of left foot (HCC) 01/19/2024    Toe infection 01/18/2024   Anemia 07/18/2012   Acute respiratory failure with hypoxia (HCC) 07/18/2012   Community acquired pneumonia 07/13/2012   Hypokalemia 07/13/2012   Dehydration 07/13/2012   Thrombocytopenia 07/13/2012   Past Medical History:  Diagnosis Date   Acute respiratory failure with hypoxia (HCC) 07/13/2012   Secondary to multi-lobar bilateral pneumonia   Anemia    Asthma    Community acquired pneumonia 07/13/2012   Bilateral/multi-lobar   Diabetes mellitus without complication (HCC)    type 2   Graves disease    Hypertension     History reviewed. No pertinent family history.  Past Surgical History:  Procedure Laterality Date   AMPUTATION Left 03/08/2024   Procedure: LEFT TRANSMETATARSAL AMPUTATION;  Surgeon: Harden Jerona GAILS, MD;  Location: [facility] OR;  Service: Orthopedics;  Laterality: Left;   AMPUTATION Left 03/27/2024   Procedure: AMPUTATION, ABOVE KNEE LEFT;  Surgeon: Harden Jerona GAILS, MD;  Location: [facility] OR;  Service: Orthopedics;  Laterality: Left;   AMPUTATION TOE Left 01/20/2024   Procedure: AMPUTATION, TOE;  Surgeon: Harden Jerona GAILS, MD;  Location: [facility] OR;  Service: Orthopedics;  Laterality: Left;  LEFT FOOT 2ND RAY   TRANSESOPHAGEAL ECHOCARDIOGRAM (CATH LAB) N/A 04/02/2024   Procedure: TRANSESOPHAGEAL ECHOCARDIOGRAM;  Surgeon: Shlomo Wilbert SAUNDERS, MD;  Location: [facility] INVASIVE CV LAB;  Service: Cardiovascular;  Laterality: N/A;   Social History   Occupational History   Not on file  Tobacco Use   Smoking status: Never    Passive exposure: Never   Smokeless tobacco: Not on file  Vaping Use   Vaping status: Never Used  Substance and Sexual Activity   Alcohol use: No   Drug use: No   Sexual activity: Not on file

## 2024-05-20 ENCOUNTER — Encounter: Payer: Self-pay | Admitting: Radiology

## 2024-05-23 ENCOUNTER — Ambulatory Visit: Admitting: Physician Assistant

## 2024-05-23 DIAGNOSIS — T8131XA Disruption of external operation (surgical) wound, not elsewhere classified, initial encounter: Secondary | ICD-10-CM

## 2024-05-24 ENCOUNTER — Encounter: Payer: Self-pay | Admitting: Physician Assistant

## 2024-05-24 NOTE — Progress Notes (Signed)
 Office Visit Note   Patient: Fred Mcintosh           Date of Birth: Jan 10, 1961           MRN: [id number] Visit Date: 05/23/2024              Requested by: [name], MD [address] [place],  [place] [address] PCP: [name], MD  Chief Complaint  Patient presents with   Left Leg - Routine Post Op    03/27/2024 left AKA      HPI: 63 y/o male here for follow up exam of his left AKA. He had developed a scab over the central incision.  The left AKA stump wound has been managed with Vashe wet to dry dressing changes.  He denies fever and chills.  He is here for follow up wound check.  Assessment & Plan: Visit Diagnoses:  1. Wound dehiscence, surgical, initial encounter     Plan: Continue wet to dry Vashe dressing with guaze and large Band aide until wound is fully healed.    Follow-Up Instructions: Return in about 27 days (around 06/19/2024).   Ortho Exam  Patient is alert, oriented, no adenopathy, well-dressed, normal affect, normal respiratory effort. The would was cleaned with 4 x 4 and vashe revealing 5 mm x 3 mm superficial 100 % granulation wound bed.  No drainage or cellulitis.  The stump is warm and viable     Imaging: No results found.    Labs: Lab Results  Component Value Date   HGBA1C 6.7 (H) 03/27/2024   HGBA1C 7.5 (H) 01/18/2024   CRP 4.8 (H) 04/05/2024   CRP 3.9 (H) 04/04/2024   CRP 2.1 (H) 04/03/2024   REPTSTATUS 04/03/2024 FINAL 03/29/2024   REPTSTATUS 04/03/2024 FINAL 03/29/2024   GRAMSTAIN  01/20/2024    RARE WBC SEEN FEW GRAM POSITIVE COCCI RARE GRAM NEGATIVE RODS    CULT  03/29/2024    NO GROWTH 5 DAYS Performed at [facility] Lab, [address], [place], [place] [address]    CULT  03/29/2024    NO GROWTH 5 DAYS Performed at [facility], [address], [place], [place] [address]    [facility] STREPTOCOCCUS ANGINOSIS 03/27/2024     Lab Results  Component Value Date   ALBUMIN 2.3 (L) 04/30/2024   ALBUMIN 1.8  (L) 04/06/2024   ALBUMIN 1.7 (L) 04/05/2024    Lab Results  Component Value Date   MG 2.0 04/23/2024   MG 2.3 04/05/2024   MG 2.7 (H) 04/04/2024   No results found for: VD25OH  No results found for: PREALBUMIN    Latest Ref Rng & Units 05/01/2024    4:12 AM 04/30/2024   11:12 AM 04/23/2024    4:13 AM  CBC EXTENDED  WBC 4.0 - 10.5 K/uL 5.3  4.9  4.7   RBC 4.22 - 5.81 MIL/uL 3.61  3.39  3.12   Hemoglobin 13.0 - 17.0 g/dL 89.3  89.9  9.5   HCT 60.9 - 52.0 % 32.6  31.1  29.0   Platelets 150 - 400 K/uL 192  177  228   NEUT# 1.7 - 7.7 K/uL 2.6  2.3  2.5   Lymph# 0.7 - 4.0 K/uL 2.0  2.1  1.7      There is no height or weight on file to calculate BMI.  Orders:  No orders of the defined types were placed in this encounter.  No orders of the defined  types were placed in this encounter.    Procedures: No procedures performed  Clinical Data: No additional findings.  ROS:  All other systems negative, except as noted in the HPI. Review of Systems  Objective: Vital Signs: There were no vitals taken for this visit.  Specialty Comments:  No specialty comments available.  PMFS History: Patient Active Problem List   Diagnosis Date Noted   Streptococcal bacteremia 04/02/2024   Necrotizing fasciitis (HCC) 03/27/2024   Altered mental status 03/27/2024   Hyperosmolar hyperglycemic state (HHS) (HCC) 03/27/2024   Wound dehiscence, surgical, initial encounter 03/08/2024   Non-healing wound of amputation stump (HCC) 03/08/2024   New onset type 2 diabetes mellitus (HCC) 01/19/2024   Essential hypertension 01/19/2024   Obesity, Class III, BMI 40-49.9 (morbid obesity) (HCC) 01/19/2024   Hyponatremia 01/19/2024   Septic shock (HCC) 01/19/2024   Cutaneous abscess of left foot 01/19/2024   Diabetic infection of left foot (HCC) 01/19/2024   Toe infection 01/18/2024   Anemia 07/18/2012   Acute respiratory failure with hypoxia (HCC) 07/18/2012   Community acquired pneumonia  07/13/2012   Hypokalemia 07/13/2012   Dehydration 07/13/2012   Thrombocytopenia 07/13/2012   Past Medical History:  Diagnosis Date   Acute respiratory failure with hypoxia (HCC) 07/13/2012   Secondary to multi-lobar bilateral pneumonia   Anemia    Asthma    Community acquired pneumonia 07/13/2012   Bilateral/multi-lobar   Diabetes mellitus without complication (HCC)    type 2   Graves disease    Hypertension     History reviewed. No pertinent family history.  Past Surgical History:  Procedure Laterality Date   AMPUTATION Left 03/08/2024   Procedure: LEFT TRANSMETATARSAL AMPUTATION;  Surgeon: Harden Jerona GAILS, MD;  Location: [facility] OR;  Service: Orthopedics;  Laterality: Left;   AMPUTATION Left 03/27/2024   Procedure: AMPUTATION, ABOVE KNEE LEFT;  Surgeon: Harden Jerona GAILS, MD;  Location: [facility] OR;  Service: Orthopedics;  Laterality: Left;   AMPUTATION TOE Left 01/20/2024   Procedure: AMPUTATION, TOE;  Surgeon: Harden Jerona GAILS, MD;  Location: [facility] OR;  Service: Orthopedics;  Laterality: Left;  LEFT FOOT 2ND RAY   TRANSESOPHAGEAL ECHOCARDIOGRAM (CATH LAB) N/A 04/02/2024   Procedure: TRANSESOPHAGEAL ECHOCARDIOGRAM;  Surgeon: Shlomo Wilbert SAUNDERS, MD;  Location: [facility] LAB;  Service: Cardiovascular;  Laterality: N/A;   Social History   Occupational History   Not on file  Tobacco Use   Smoking status: Never    Passive exposure: Never   Smokeless tobacco: Not on file  Vaping Use   Vaping status: Never Used  Substance and Sexual Activity   Alcohol use: No   Drug use: No   Sexual activity: Not on file

## 2024-06-19 ENCOUNTER — Encounter: Admitting: Physician Assistant

## 2024-06-20 ENCOUNTER — Ambulatory Visit: Admitting: Physician Assistant

## 2024-06-20 DIAGNOSIS — T8131XA Disruption of external operation (surgical) wound, not elsewhere classified, initial encounter: Secondary | ICD-10-CM

## 2024-06-20 NOTE — Progress Notes (Addendum)
 "  Office Visit Note   Patient: Fred Mcintosh           Date of Birth: [birth year]           MRN: [id number] Visit Date: 06/20/2024              Requested by: [name], MD [address] [place],  [place] [address] PCP: [name], MD  Chief Complaint  Patient presents with   Left Leg - Routine Post Op    03/27/2024 left AKA      HPI:  63 y/o male here for follow up exam of his left AKA 03/27/24. He had developed a scab over the central incision. This has been managed with Vashe wet to dry dressing changes. The would is slowly healing.  He is here today for follow up.   Assessment & Plan: Visit Diagnoses:  1. Wound dehiscence, surgical, initial encounter     Plan: He was given a prescription for [facility] to receive a left AKA prosthesis and supplies. If he is still at the SNF he can have gait training PT there.  If he gets discharged he will need to do gait training at an OP PT facility.  He will call our office if his living arrangements change.    Follow-Up Instructions: Return if symptoms worsen or fail to improve.   Ortho Exam  Patient is alert, oriented, no adenopathy, well-dressed, normal affect, normal respiratory effort. Left AKA is fully healed at this time.  The is no cellulitis or drainage.  No pening in the skin surrounding the stump incision.  The stump is warm and appears viable.    Patient is a new left transfemoral amputee.  Patient's current comorbidities are not expected to impact the ability to function with the prescribed prosthesis. Patient verbally communicates a strong desire to use a prosthesis. Patient currently requires mobility aids to ambulate without a prosthesis.  Expects not to use mobility aids with a new prosthesis. Patient is expected to resume or reach their K Level within 6 months. Patient was active before the amputation and independent with stairs, uneven terrain, varying cadence, and a community ambulator.  Patient is a K2  level ambulator that will use a prosthesis to walk around their home and the community over low level environmental barriers.     The patient will benefit from an MPK knee because they frequently encounter uneven terrain, stairs, and sometimes have to walk backwards. The stumble recovery and intuitive stance features will reduce fall risk and allow the patient to walk down stairs step over step.    Imaging: No results found. No images are attached to the encounter.  Labs: Lab Results  Component Value Date   HGBA1C 6.7 (H) 03/27/2024   HGBA1C 7.5 (H) 01/18/2024   CRP 4.8 (H) 04/05/2024   CRP 3.9 (H) 04/04/2024   CRP 2.1 (H) 04/03/2024   REPTSTATUS 04/03/2024 FINAL 03/29/2024   REPTSTATUS 04/03/2024 FINAL 03/29/2024   GRAMSTAIN  01/20/2024    RARE WBC SEEN FEW GRAM POSITIVE COCCI RARE GRAM NEGATIVE RODS    CULT  03/29/2024    NO GROWTH 5 DAYS Performed at [facility], [address] [place], [place] [address]    CULT  03/29/2024    NO GROWTH 5 DAYS Performed at [facility], [address], [place], [place] [address]    [facility] STREPTOCOCCUS ANGINOSIS 03/27/2024     Lab Results  Component Value Date  ALBUMIN 2.3 (L) 04/30/2024   ALBUMIN 1.8 (L) 04/06/2024   ALBUMIN 1.7 (L) 04/05/2024    Lab Results  Component Value Date   MG 2.0 04/23/2024   MG 2.3 04/05/2024   MG 2.7 (H) 04/04/2024   No results found for: VD25OH  No results found for: PREALBUMIN    Latest Ref Rng & Units 05/01/2024    4:12 AM 04/30/2024   11:12 AM 04/23/2024    4:13 AM  CBC EXTENDED  WBC 4.0 - 10.5 K/uL 5.3  4.9  4.7   RBC 4.22 - 5.81 MIL/uL 3.61  3.39  3.12   Hemoglobin 13.0 - 17.0 g/dL 89.3  89.9  9.5   HCT 60.9 - 52.0 % 32.6  31.1  29.0   Platelets 150 - 400 K/uL 192  177  228   NEUT# 1.7 - 7.7 K/uL 2.6  2.3  2.5   Lymph# 0.7 - 4.0 K/uL 2.0  2.1  1.7      There is no height or weight on file to calculate BMI.  Orders:  No orders of the defined types  were placed in this encounter.  No orders of the defined types were placed in this encounter.    Procedures: No procedures performed  Clinical Data: No additional findings.  ROS:  All other systems negative, except as noted in the HPI. Review of Systems  Objective: Vital Signs: There were no vitals taken for this visit.  Specialty Comments:  No specialty comments available.  PMFS History: Patient Active Problem List   Diagnosis Date Noted   Streptococcal bacteremia 04/02/2024   Necrotizing fasciitis (HCC) 03/27/2024   Altered mental status 03/27/2024   Hyperosmolar hyperglycemic state (HHS) (HCC) 03/27/2024   Wound dehiscence, surgical, initial encounter 03/08/2024   Non-healing wound of amputation stump (HCC) 03/08/2024   New onset type 2 diabetes mellitus (HCC) 01/19/2024   Essential hypertension 01/19/2024   Obesity, Class III, BMI 40-49.9 (morbid obesity) (HCC) 01/19/2024   Hyponatremia 01/19/2024   Septic shock (HCC) 01/19/2024   Cutaneous abscess of left foot 01/19/2024   Diabetic infection of left foot (HCC) 01/19/2024   Toe infection 01/18/2024   Anemia 07/18/2012   Acute respiratory failure with hypoxia (HCC) 07/18/2012   Community acquired pneumonia 07/13/2012   Hypokalemia 07/13/2012   Dehydration 07/13/2012   Thrombocytopenia 07/13/2012   Past Medical History:  Diagnosis Date   Acute respiratory failure with hypoxia (HCC) 07/13/2012   Secondary to multi-lobar bilateral pneumonia   Anemia    Asthma    Community acquired pneumonia 07/13/2012   Bilateral/multi-lobar   Diabetes mellitus without complication (HCC)    type 2   Graves disease    Hypertension     History reviewed. No pertinent family history.  Past Surgical History:  Procedure Laterality Date   AMPUTATION Left 03/08/2024   Procedure: LEFT TRANSMETATARSAL AMPUTATION;  Surgeon: Harden Jerona GAILS, MD;  Location: [facility] OR;  Service: Orthopedics;  Laterality: Left;   AMPUTATION Left 03/27/2024    Procedure: AMPUTATION, ABOVE KNEE LEFT;  Surgeon: Harden Jerona GAILS, MD;  Location: [facility] OR;  Service: Orthopedics;  Laterality: Left;   AMPUTATION TOE Left 01/20/2024   Procedure: AMPUTATION, TOE;  Surgeon: Harden Jerona GAILS, MD;  Location: [facility] OR;  Service: Orthopedics;  Laterality: Left;  LEFT FOOT 2ND RAY   TRANSESOPHAGEAL ECHOCARDIOGRAM (CATH LAB) N/A 04/02/2024   Procedure: TRANSESOPHAGEAL ECHOCARDIOGRAM;  Surgeon: Shlomo Wilbert SAUNDERS, MD;  Location: [facility];  Service: Cardiovascular;  Laterality: N/A;  Social History   Occupational History   Not on file  Tobacco Use   Smoking status: Never    Passive exposure: Never   Smokeless tobacco: Not on file  Vaping Use   Vaping status: Never Used  Substance and Sexual Activity   Alcohol use: No   Drug use: No   Sexual activity: Not on file       "

## 2024-06-21 ENCOUNTER — Encounter: Payer: Self-pay | Admitting: Physician Assistant

## 2024-07-29 ENCOUNTER — Telehealth: Payer: Self-pay | Admitting: Orthopedic Surgery

## 2024-07-29 NOTE — Telephone Encounter (Signed)
 Can you please add the 5 tenants to his last OV note please so they can process his Rx? Thank you!

## 2024-07-29 NOTE — Telephone Encounter (Signed)
 Pt called stating he has an appt with [facility] tomorrow and [facility] called pt stating they are asking for script for prosthetic leg and notes. Please fax this to [facility] today for pt appt tomorrow with [facility]. Pt number is [phone]

## 2024-07-29 NOTE — Telephone Encounter (Signed)
 Got an email from [facility] for this same request. This has been emailed to them. Closing out message.

## 2024-08-13 ENCOUNTER — Telehealth: Payer: Self-pay

## 2024-08-13 NOTE — Telephone Encounter (Signed)
 Per [facility], they are needing amended in his notes from 06/20/24 indicating that you are agreeable with him getting an MPK.

## 2024-08-13 NOTE — Telephone Encounter (Signed)
 Message sent to [facility] that this is completed.
